# Patient Record
Sex: Female | Born: 1990 | Race: Black or African American | Hispanic: No | Marital: Married | Smoking: Never smoker
Health system: Southern US, Community
[De-identification: ages and names within clinical notes are randomized; demographics above are authoritative.]

## PROBLEM LIST (undated history)

## (undated) DIAGNOSIS — I10 Essential (primary) hypertension: Secondary | ICD-10-CM

## (undated) DIAGNOSIS — B20 Human immunodeficiency virus [HIV] disease: Secondary | ICD-10-CM

## (undated) DIAGNOSIS — Z87442 Personal history of urinary calculi: Secondary | ICD-10-CM

## (undated) DIAGNOSIS — D849 Immunodeficiency, unspecified: Secondary | ICD-10-CM

## (undated) DIAGNOSIS — N200 Calculus of kidney: Secondary | ICD-10-CM

## (undated) DIAGNOSIS — Z21 Asymptomatic human immunodeficiency virus [HIV] infection status: Secondary | ICD-10-CM

## (undated) DIAGNOSIS — B999 Unspecified infectious disease: Secondary | ICD-10-CM

## (undated) HISTORY — DX: Essential (primary) hypertension: I10

---
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
## 2010-05-27 LAB — URINALYSIS, ROUTINE W REFLEX MICROSCOPIC
Nitrite: NEGATIVE
Specific Gravity, Urine: 1.02 (ref 1.005–1.030)
Urobilinogen, UA: 1 mg/dL (ref 0.0–1.0)

## 2010-05-27 LAB — BASIC METABOLIC PANEL
GFR calc non Af Amer: >60 mL/min (ref >60)
Potassium: 3.3 mEq/L — ABNORMAL LOW (ref 3.5–5.1)
Sodium: 137 mEq/L (ref 135–145)

## 2010-05-27 LAB — CBC
HCT: 40.2 % (ref 36.0–46.0)
Hemoglobin: 13.4 g/dL (ref 12.0–15.0)
RDW: 14.9 % (ref 11.5–15.5)
WBC: 3.7 10*3/uL — ABNORMAL LOW (ref 4.0–10.5)

## 2010-05-27 LAB — DIFFERENTIAL
Basophils Absolute: 0 10*3/uL (ref 0.0–0.1)
Lymphocytes Relative: 29 % (ref 12–46)
Monocytes Absolute: 0.3 10*3/uL (ref 0.1–1.0)
Neutro Abs: 2.3 10*3/uL (ref 1.7–7.7)
Neutrophils Relative %: 61 % (ref 43–77)

## 2010-05-27 LAB — URINE MICROSCOPIC-ADD ON

## 2010-05-27 LAB — POCT PREGNANCY, URINE: Preg Test, Ur: NEGATIVE

## 2010-05-27 LAB — URINE CULTURE

## 2010-06-16 LAB — CBC
HCT: 27.2 % — ABNORMAL LOW (ref 36.0–46.0)
HCT: 29.7 % — ABNORMAL LOW (ref 36.0–46.0)
HCT: 31.7 % — ABNORMAL LOW (ref 36.0–46.0)
Hemoglobin: 11.8 g/dL — ABNORMAL LOW (ref 12.0–15.0)
Hemoglobin: 9.4 g/dL — ABNORMAL LOW (ref 12.0–15.0)
MCV: 89.5 fL (ref 78.0–100.0)
MCV: 90.6 fL (ref 78.0–100.0)
Platelets: 239 10*3/uL (ref 150–400)
Platelets: 275 10*3/uL (ref 150–400)
RBC: 3.87 MIL/uL (ref 3.87–5.11)
RBC: 3.04 MIL/uL — ABNORMAL LOW (ref 3.87–5.11)
RDW: 14.9 % (ref 11.5–15.5)
RDW: 15.4 % (ref 11.5–15.5)
WBC: 21 10*3/uL — ABNORMAL HIGH (ref 4.0–10.5)
WBC: 17.5 10*3/uL — ABNORMAL HIGH (ref 4.0–10.5)
WBC: 17.9 10*3/uL — ABNORMAL HIGH (ref 4.0–10.5)
WBC: 9.2 10*3/uL (ref 4.0–10.5)

## 2010-06-16 LAB — URINE MICROSCOPIC-ADD ON

## 2010-06-16 LAB — COMPREHENSIVE METABOLIC PANEL
ALT: 37 U/L — ABNORMAL HIGH (ref 0–35)
AST: 38 U/L — ABNORMAL HIGH (ref 0–37)
Alkaline Phosphatase: 115 U/L (ref 39–117)
CO2: 23 mEq/L (ref 19–32)
Chloride: 104 mEq/L (ref 96–112)
GFR calc Af Amer: >60 mL/min (ref >60)
GFR calc non Af Amer: >60 mL/min (ref >60)
Glucose, Bld: 105 mg/dL — ABNORMAL HIGH (ref 70–99)
Potassium: 3.4 mEq/L — ABNORMAL LOW (ref 3.5–5.1)
Sodium: 135 mEq/L (ref 135–145)

## 2010-06-16 LAB — URINALYSIS, ROUTINE W REFLEX MICROSCOPIC
Nitrite: NEGATIVE
Specific Gravity, Urine: 1.01 (ref 1.005–1.030)
Urobilinogen, UA: 2 mg/dL — ABNORMAL HIGH (ref 0.0–1.0)

## 2010-06-16 LAB — DIFFERENTIAL
Basophils Absolute: 0 10*3/uL (ref 0.0–0.1)
Basophils Relative: 0 % (ref 0–1)
Eosinophils Absolute: 0 10*3/uL (ref 0.0–0.7)
Eosinophils Relative: 0 % (ref 0–5)
Lymphocytes Relative: 5 % — ABNORMAL LOW (ref 12–46)
Lymphs Abs: 1.5 10*3/uL (ref 0.7–4.0)
Lymphs Abs: 0.9 10*3/uL (ref 0.7–4.0)
Neutro Abs: 15.7 10*3/uL — ABNORMAL HIGH (ref 1.7–7.7)
Neutrophils Relative %: 85 % — ABNORMAL HIGH (ref 43–77)

## 2010-06-16 LAB — URINE CULTURE

## 2010-06-16 LAB — LIPASE, BLOOD: Lipase: 15 U/L (ref 11–59)

## 2010-06-24 LAB — URINE MICROSCOPIC-ADD ON

## 2010-06-24 LAB — URINALYSIS, ROUTINE W REFLEX MICROSCOPIC
Bilirubin Urine: NEGATIVE
Glucose, UA: NEGATIVE mg/dL
Specific Gravity, Urine: 1.015 (ref 1.005–1.030)
Urobilinogen, UA: 1 mg/dL (ref 0.0–1.0)
pH: 6 (ref 5.0–8.0)

## 2010-06-24 LAB — URINE CULTURE

## 2010-12-13 LAB — STREP A DNA PROBE: Group A Strep Probe: NEGATIVE

## 2011-09-01 LAB — LIPID PANEL
Cholesterol: 166 mg/dL (ref 0–200)
HDL: 51 mg/dL (ref 35–70)
LDL Cholesterol: 108 mg/dL
Triglycerides: 34 mg/dL — AB (ref 40–160)

## 2011-09-01 LAB — HEPATIC FUNCTION PANEL: Alkaline Phosphatase: 84 U/L (ref 25–125)

## 2011-09-01 LAB — CBC AND DIFFERENTIAL: Hemoglobin: 11.8 g/dL — AB (ref 12.0–16.0)

## 2011-12-09 ENCOUNTER — Other Ambulatory Visit: Payer: Self-pay | Admitting: Internal Medicine

## 2011-12-09 ENCOUNTER — Ambulatory Visit (INDEPENDENT_AMBULATORY_CARE_PROVIDER_SITE_OTHER): Payer: Self-pay

## 2011-12-09 DIAGNOSIS — B20 Human immunodeficiency virus [HIV] disease: Secondary | ICD-10-CM

## 2011-12-09 DIAGNOSIS — Z113 Encounter for screening for infections with a predominantly sexual mode of transmission: Secondary | ICD-10-CM

## 2011-12-09 DIAGNOSIS — Z79899 Other long term (current) drug therapy: Secondary | ICD-10-CM

## 2011-12-09 DIAGNOSIS — Z206 Contact with and (suspected) exposure to human immunodeficiency virus [HIV]: Secondary | ICD-10-CM

## 2011-12-09 LAB — CBC WITH DIFFERENTIAL/PLATELET
Basophils Absolute: 0 10*3/uL (ref 0.0–0.1)
Eosinophils Absolute: 0.2 10*3/uL (ref 0.0–0.7)
Eosinophils Relative: 4 % (ref 0–5)
Lymphocytes Relative: 10 % — ABNORMAL LOW (ref 12–46)
MCV: 83 fL (ref 78.0–100.0)
Neutrophils Relative %: 79 % — ABNORMAL HIGH (ref 43–77)
Platelets: 51 10*3/uL — ABNORMAL LOW (ref 150–400)
RDW: 13.3 % (ref 11.5–15.5)
WBC: 5.1 10*3/uL (ref 4.0–10.5)

## 2011-12-09 LAB — LIPID PANEL
Cholesterol: 173 mg/dL (ref 0–200)
HDL: 49 mg/dL (ref >39)
LDL Cholesterol: 116 mg/dL — ABNORMAL HIGH (ref 0–99)
Triglycerides: 38 mg/dL (ref <150)

## 2011-12-09 LAB — HEPATITIS B SURFACE ANTIGEN: Hepatitis B Surface Ag: NEGATIVE

## 2011-12-09 LAB — HEPATITIS C ANTIBODY: HCV Ab: NEGATIVE

## 2011-12-09 LAB — HEPATITIS B SURFACE ANTIBODY,QUALITATIVE: Hep B S Ab: NEGATIVE

## 2011-12-09 LAB — T-HELPER CELL (CD4) - (RCID CLINIC ONLY): CD4 % Helper T Cell: 21 % — ABNORMAL LOW (ref 33–55)

## 2011-12-10 LAB — HEPATITIS B CORE ANTIBODY, TOTAL: Hep B Core Total Ab: NEGATIVE

## 2011-12-10 LAB — HEPATITIS A ANTIBODY, TOTAL: Hep A Total Ab: NEGATIVE

## 2011-12-10 LAB — COMPLETE METABOLIC PANEL WITH GFR
ALT: 14 U/L (ref 0–35)
AST: 19 U/L (ref 0–37)
Chloride: 105 mEq/L (ref 96–112)
Creat: 0.83 mg/dL (ref 0.50–1.10)
Sodium: 136 mEq/L (ref 135–145)
Total Bilirubin: 0.7 mg/dL (ref 0.3–1.2)

## 2011-12-10 LAB — URINALYSIS
Ketones, ur: NEGATIVE mg/dL
Nitrite: POSITIVE — AB
Specific Gravity, Urine: 1.021 (ref 1.005–1.030)
pH: 6.5 (ref 5.0–8.0)

## 2011-12-15 LAB — HIV-1 GENOTYPR PLUS

## 2011-12-16 DIAGNOSIS — Z206 Contact with and (suspected) exposure to human immunodeficiency virus [HIV]: Secondary | ICD-10-CM | POA: Insufficient documentation

## 2011-12-16 DIAGNOSIS — Z23 Encounter for immunization: Secondary | ICD-10-CM

## 2011-12-16 LAB — HIV RNA, QUANTITATIVE, PCR: HIV 1 RNA Quant: 4030

## 2011-12-16 NOTE — Progress Notes (Signed)
Pt diagnosed through perinatal exposure.   She has expressed interest in starting anti retrovirals. She would also like to start on some form of birth control and will need a referral to [facility].  She has never had a pap/pelvic exam.  Pt. is currently attending [employer] school in [place] and has requested to transfer her HIV care to [place] due to transportation and time issues. Currently due to school schedule she is only able to come for Friday am visits.  I have informed the patient after the first visit this may no longer be an option.    Laurell Josephs, RN

## 2012-01-06 ENCOUNTER — Ambulatory Visit: Payer: Self-pay | Admitting: Internal Medicine

## 2012-01-06 ENCOUNTER — Ambulatory Visit: Payer: Self-pay

## 2012-01-13 ENCOUNTER — Ambulatory Visit: Payer: Self-pay | Admitting: Internal Medicine

## 2012-02-12 ENCOUNTER — Encounter (HOSPITAL_COMMUNITY): Payer: Self-pay | Admitting: Emergency Medicine

## 2012-02-12 ENCOUNTER — Emergency Department (HOSPITAL_COMMUNITY)
Admission: EM | Admit: 2012-02-12 | Discharge: 2012-02-13 | Disposition: A | Discharge status: Home / Self Care | Payer: Self-pay | Attending: Emergency Medicine | Admitting: Emergency Medicine

## 2012-02-12 DIAGNOSIS — N730 Acute parametritis and pelvic cellulitis: Secondary | ICD-10-CM | POA: Insufficient documentation

## 2012-02-12 DIAGNOSIS — Z3202 Encounter for pregnancy test, result negative: Secondary | ICD-10-CM | POA: Insufficient documentation

## 2012-02-12 DIAGNOSIS — N760 Acute vaginitis: Secondary | ICD-10-CM | POA: Insufficient documentation

## 2012-02-12 DIAGNOSIS — N390 Urinary tract infection, site not specified: Secondary | ICD-10-CM | POA: Insufficient documentation

## 2012-02-12 LAB — URINALYSIS, ROUTINE W REFLEX MICROSCOPIC
Nitrite: NEGATIVE
Protein, ur: NEGATIVE mg/dL
Specific Gravity, Urine: 1.015 (ref 1.005–1.030)
Urobilinogen, UA: 0.2 mg/dL (ref 0.0–1.0)

## 2012-02-12 LAB — WET PREP, GENITAL
Trich, Wet Prep: NONE SEEN
Yeast Wet Prep HPF POC: NONE SEEN

## 2012-02-12 LAB — PREGNANCY, URINE: Preg Test, Ur: NEGATIVE

## 2012-02-12 LAB — URINE MICROSCOPIC-ADD ON

## 2012-02-12 NOTE — ED Notes (Signed)
Pt c/o lower abd since the 27th.

## 2012-02-12 NOTE — ED Provider Notes (Signed)
History   This chart was scribed for Glynn Octave, MD by Leone Payor, ED Scribe. This patient was seen in room APA05/APA05 and the patient's care was started at 2140.   CSN: [id number]  Arrival date & time 02/12/12  1826   First MD Initiated Contact with Patient 02/12/12 2140      Chief Complaint  Patient presents with  . Abdominal Pain     The history is provided by the patient. No language interpreter was used.    Kimberly Bishop is a 21 y.o. female who presents to the Emergency Department complaining of moderate, intermittent lower abdominal pain for 5 days. Pt denies any vaginal discharge or vaginal bleeding. She reports being sexually active.  Pt reports a h/o appendectomy.  Pt denies smoking and alcohol use.   History reviewed. No pertinent past medical history.  Past Surgical History  Procedure Date  . Appendectomy 2011    Family History  Problem Relation Age of Onset  . Adopted: Yes    History  Substance Use Topics  . Smoking status: Never Smoker   . Smokeless tobacco: Never Used  . Alcohol Use: No    No OB history provided.   Review of Systems  Gastrointestinal: Positive for abdominal pain.  Genitourinary: Negative for vaginal bleeding and vaginal discharge.  All other systems reviewed and are negative.    Allergies  Review of patient's allergies indicates no known allergies.  Home Medications  No current outpatient prescriptions on file.  BP 136/72  Pulse 91  Temp 98.5 F (36.9 C) (Oral)  Resp 17  Ht 5\' 2"  (1.575 m)  Wt 110 lb (49.896 kg)  BMI 20.12 kg/m2  SpO2 98%  LMP 01/20/2012  Physical Exam  Nursing note and vitals reviewed. Constitutional: She is oriented to person, place, and time. She appears well-developed and well-nourished. No distress.  HENT:  Head: Normocephalic and atraumatic.  Eyes: EOM are normal.  Neck: Normal range of motion. Neck supple. No tracheal deviation present.  Cardiovascular: Normal rate, regular  rhythm and normal heart sounds.   Pulmonary/Chest: Effort normal and breath sounds normal. No respiratory distress.  Abdominal: Soft. Bowel sounds are normal.       Suprapubic tenderness. No cva tenderness. No guarding or rebound.   Genitourinary: Cervix exhibits discharge. Cervix exhibits no motion tenderness. Right adnexum displays no tenderness. Vaginal discharge found.       Copious white cervical discharge, no CMT, no adnexal tenderness  Musculoskeletal: Normal range of motion.  Neurological: She is alert and oriented to person, place, and time.  Skin: Skin is warm and dry.  Psychiatric: She has a normal mood and affect. Her behavior is normal.    ED Course  Procedures (including critical care time)  DIAGNOSTIC STUDIES: Oxygen Saturation is 98% on room air, normal by my interpretation.    COORDINATION OF CARE: 9:48 PM Discussed treatment plan which includes pelvic exam with pt at bedside and pt agreed to plan.    Labs Reviewed  URINALYSIS, ROUTINE W REFLEX MICROSCOPIC - Abnormal; Notable for the following:    APPearance CLOUDY (*)     Hgb urine dipstick TRACE (*)     Leukocytes, UA SMALL (*)     All other components within normal limits  WET PREP, GENITAL - Abnormal; Notable for the following:    Clue Cells Wet Prep HPF POC MANY (*)     WBC, Wet Prep HPF POC FEW (*)     All other components within normal  limits  URINE MICROSCOPIC-ADD ON - Abnormal; Notable for the following:    Squamous Epithelial / LPF FEW (*)     Bacteria, UA MANY (*)     All other components within normal limits  PREGNANCY, URINE  GC/CHLAMYDIA PROBE AMP  URINE CULTURE   No results found.   No diagnosis found.    MDM  Suprapubic abdominal pain for the past 4 days. No fever, chills, dysuria, hematuria, vaginal bleeding or discharge.  HCG negative. Urinalysis positive.  We'll treat her urinary tract infection, bacterial vaginosis and empirically for PID.    I personally performed the  services described in this documentation, which was scribed in my presence. The recorded information has been reviewed and is accurate.   Glynn Octave, MD 02/13/12 0005

## 2012-02-13 MED ORDER — CEPHALEXIN 500 MG PO CAPS: 500.0000 mg | ORAL_CAPSULE | Freq: Four times a day (QID) | ORAL | Status: DC

## 2012-02-13 MED ORDER — ONDANSETRON 4 MG PO TBDP
4.0000 mg | ORAL_TABLET | Freq: Once | ORAL | Status: AC
  Administered 2012-02-13: 4 mg via ORAL
  Filled 2012-02-13: qty 1

## 2012-02-13 MED ORDER — AZITHROMYCIN 250 MG PO TABS
1000.0000 mg | ORAL_TABLET | Freq: Every day | ORAL | Status: DC
  Administered 2012-02-13: 1000 mg via ORAL
  Filled 2012-02-13: qty 4

## 2012-02-13 MED ORDER — LIDOCAINE HCL (PF) 1 % IJ SOLN
INTRAMUSCULAR | Status: AC
  Administered 2012-02-13: 5 mL
  Filled 2012-02-13: qty 5

## 2012-02-13 MED ORDER — METRONIDAZOLE 500 MG PO TABS
2000.0000 mg | ORAL_TABLET | Freq: Once | ORAL | Status: AC
  Administered 2012-02-13: 2000 mg via ORAL
  Filled 2012-02-13: qty 4

## 2012-02-13 MED ORDER — CEFTRIAXONE SODIUM 250 MG IJ SOLR
250.0000 mg | Freq: Once | INTRAMUSCULAR | Status: AC
  Administered 2012-02-13: 250 mg via INTRAMUSCULAR
  Filled 2012-02-13: qty 250

## 2012-02-14 LAB — GC/CHLAMYDIA PROBE AMP: CT Probe RNA: NEGATIVE

## 2012-02-14 LAB — URINE CULTURE

## 2012-04-12 ENCOUNTER — Ambulatory Visit: Payer: Self-pay | Admitting: Internal Medicine

## 2012-06-18 ENCOUNTER — Telehealth: Payer: Self-pay

## 2012-06-18 DIAGNOSIS — B20 Human immunodeficiency virus [HIV] disease: Secondary | ICD-10-CM

## 2012-06-18 NOTE — Telephone Encounter (Signed)
Pt was a no show for [facility] visit after intake. She is calling now to reschedule visit.  She is still in school and has a hectic schedule.   I have schedule her for repeat labs and office visit. She was advised to call and reschedule or cancel if she can't make appointment.  She was also informed of abnormal labs and the importance of a return visit.   Laurell Josephs, RN

## 2012-06-20 ENCOUNTER — Other Ambulatory Visit: Payer: Self-pay | Admitting: *Deleted

## 2012-06-20 ENCOUNTER — Ambulatory Visit: Payer: Self-pay

## 2012-06-20 DIAGNOSIS — B20 Human immunodeficiency virus [HIV] disease: Secondary | ICD-10-CM

## 2012-06-20 DIAGNOSIS — Z113 Encounter for screening for infections with a predominantly sexual mode of transmission: Secondary | ICD-10-CM

## 2012-06-21 ENCOUNTER — Other Ambulatory Visit: Payer: Self-pay | Admitting: Internal Medicine

## 2012-06-21 ENCOUNTER — Other Ambulatory Visit (INDEPENDENT_AMBULATORY_CARE_PROVIDER_SITE_OTHER): Payer: Self-pay

## 2012-06-21 ENCOUNTER — Ambulatory Visit: Payer: Self-pay

## 2012-06-21 DIAGNOSIS — Z113 Encounter for screening for infections with a predominantly sexual mode of transmission: Secondary | ICD-10-CM

## 2012-06-21 DIAGNOSIS — B20 Human immunodeficiency virus [HIV] disease: Secondary | ICD-10-CM

## 2012-06-21 LAB — CBC WITH DIFFERENTIAL/PLATELET
Eosinophils Absolute: 0.1 10*3/uL (ref 0.0–0.7)
Eosinophils Relative: 2 % (ref 0–5)
HCT: 38.3 % (ref 36.0–46.0)
Lymphocytes Relative: 10 % — ABNORMAL LOW (ref 12–46)
Lymphs Abs: 0.4 10*3/uL — ABNORMAL LOW (ref 0.7–4.0)
MCH: 28.4 pg (ref 26.0–34.0)
MCV: 84.9 fL (ref 78.0–100.0)
Monocytes Absolute: 0.4 10*3/uL (ref 0.1–1.0)
Platelets: 52 10*3/uL — ABNORMAL LOW (ref 150–400)
RBC: 4.51 MIL/uL (ref 3.87–5.11)

## 2012-06-21 LAB — COMPLETE METABOLIC PANEL WITH GFR
ALT: 18 U/L (ref 0–35)
AST: 21 U/L (ref 0–37)
Albumin: 4.4 g/dL (ref 3.5–5.2)
Alkaline Phosphatase: 85 U/L (ref 39–117)
Potassium: 4.4 mEq/L (ref 3.5–5.3)
Sodium: 139 mEq/L (ref 135–145)
Total Protein: 8.2 g/dL (ref 6.0–8.3)

## 2012-06-24 ENCOUNTER — Emergency Department (INDEPENDENT_AMBULATORY_CARE_PROVIDER_SITE_OTHER)
Admission: EM | Admit: 2012-06-24 | Discharge: 2012-06-24 | Disposition: A | Discharge status: Home / Self Care | Payer: Self-pay | Source: Home / Self Care

## 2012-06-24 ENCOUNTER — Encounter (HOSPITAL_COMMUNITY): Payer: Self-pay | Admitting: *Deleted

## 2012-06-24 DIAGNOSIS — J029 Acute pharyngitis, unspecified: Secondary | ICD-10-CM

## 2012-06-24 DIAGNOSIS — B20 Human immunodeficiency virus [HIV] disease: Secondary | ICD-10-CM

## 2012-06-24 HISTORY — DX: Asymptomatic human immunodeficiency virus (hiv) infection status: Z21

## 2012-06-24 HISTORY — DX: Human immunodeficiency virus (HIV) disease: B20

## 2012-06-24 HISTORY — DX: Immunodeficiency, unspecified: D84.9

## 2012-06-24 LAB — POCT RAPID STREP A: Streptococcus, Group A Screen (Direct): NEGATIVE

## 2012-06-24 NOTE — ED Provider Notes (Signed)
Kimberly Bishop is a 22 y.o. female who presents to Urgent Care today for sore throat. Patient has had a sore throat for the last several days. She feels well otherwise with no fevers chills cough or congestion. She denies any weight loss.    However she has a history of perinatal acquired HIV. When she was an adolescent she was on therapy with an undetectable viral load. However now that she is an adult she was transitioned to the adult clinic and has not been seen. She is no longer taking any medication and having unprotected sex with her boyfriend.  She was recently approved for medication therapy at the adult clinic however missed her appointment.  Labs at that time showed a CD4 count of 60 with a viral load of 2700. She is currently asymptomatic.   PMH reviewed. HIV History  Substance Use Topics  . Smoking status: Never Smoker   . Smokeless tobacco: Never Used  . Alcohol Use: No   ROS as above Medications reviewed. No current facility-administered medications for this encounter.   Current Outpatient Prescriptions  Medication Sig Dispense Refill  . cephALEXin (KEFLEX) 500 MG capsule Take 1 capsule (500 mg total) by mouth 4 (four) times daily.  40 capsule  0    Exam:  BP 144/81  Pulse 86  Temp(Src) 98.8 F (37.1 C) (Oral)  Resp 18  SpO2 100% Gen: Well NAD HEENT: EOMI,  MMM, no ulcers or exudate in the posterior pharynx. No lymphadenopathy.  Lungs: CTABL Nl WOB Heart: RRR no MRG Abd: NABS, NT, ND Exts: Non edematous BL  LE, warm and well perfused.   Results for orders placed during the hospital encounter of 06/24/12 (from the past 24 hour(s))  POCT RAPID STREP A ([facility] URG CARE ONLY)     Status: None   Collection Time    06/24/12  3:44 PM      Result Value Range   Streptococcus, Group A Screen (Direct) NEGATIVE  NEGATIVE   No results found.  Assessment and Plan: 22 y.o. 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   I  had a lengthy discussion of the importance of maintaining therapy. I explained that she is likely to die within several years if she does not take her antiviral medication. Additionally had a lengthy discussion about the importance of contraception with her boyfriend and herself. I stressed that her boyfriend get tested for HIV and avoid any sexual contacts with anyone else.   As for her sore throat this is likely viral pharyngitis is currently in the community. I don't see any evidence of CMV ulcer or thrush or other immunodeficiency related cause of pharyngitis.  However I explained that this may be a cause of her sore throat.   I again stressed the importance of following up with the adult infectious disease clinic and treating this disease with the seriousness that it deserves. I explained that she can have a normal healthy life if she is on adequate anti-retroviral therapy and under the care of an infectious disease doctor.   After some time she expresses understanding and agreement. She will call the infectious disease clinic tomorrow.      Rodolph Bong, MD 06/24/12 1600

## 2012-06-24 NOTE — ED Provider Notes (Signed)
Medical screening examination/treatment/procedure(s) were performed by non-physician practitioner and as supervising physician I was immediately available for consultation/collaboration.  Raynald Blend, MD 06/24/12 1640

## 2012-06-24 NOTE — ED Notes (Signed)
Sore throat for the past 2 days

## 2012-06-25 ENCOUNTER — Telehealth: Payer: Self-pay | Admitting: *Deleted

## 2012-06-25 NOTE — Telephone Encounter (Signed)
Patient called and advised she went to the ED over the weekend and was told she needs to get a sooner appt and get on meds asap. She is worried and I explained to her she needs to speak with Britta Mccreedy to get her ADAP/[facility] paperwork filled out as we do not have meds in the clinic and it will take at least 2 weeks to get approved to cover her meds.

## 2012-06-25 NOTE — Telephone Encounter (Signed)
Patient advised she has already meet with Britta Mccreedy and done the paperwork.

## 2012-07-03 ENCOUNTER — Ambulatory Visit (INDEPENDENT_AMBULATORY_CARE_PROVIDER_SITE_OTHER): Payer: Self-pay | Admitting: Internal Medicine

## 2012-07-03 ENCOUNTER — Encounter: Payer: Self-pay | Admitting: Internal Medicine

## 2012-07-03 ENCOUNTER — Telehealth: Payer: Self-pay

## 2012-07-03 VITALS — BP 146/81 | HR 87 | Temp 98.3°F | Ht 62.0 in | Wt 106.0 lb

## 2012-07-03 DIAGNOSIS — O98719 Human immunodeficiency virus [HIV] disease complicating pregnancy, unspecified trimester: Secondary | ICD-10-CM | POA: Insufficient documentation

## 2012-07-03 DIAGNOSIS — B20 Human immunodeficiency virus [HIV] disease: Secondary | ICD-10-CM

## 2012-07-03 LAB — POCT URINE PREGNANCY: Preg Test, Ur: NEGATIVE

## 2012-07-03 MED ORDER — SULFAMETHOXAZOLE-TMP DS 800-160 MG PO TABS: 1.0000 | ORAL_TABLET | Freq: Every day | ORAL | Status: DC

## 2012-07-03 MED ORDER — DARUNAVIR ETHANOLATE 800 MG PO TABS: 800.0000 mg | ORAL_TABLET | Freq: Every day | ORAL | Status: DC

## 2012-07-03 MED ORDER — EMTRICITABINE-TENOFOVIR DF 200-300 MG PO TABS: 1.0000 | ORAL_TABLET | Freq: Every day | ORAL | Status: DC

## 2012-07-03 MED ORDER — RITONAVIR 100 MG PO TABS: 100.0000 mg | ORAL_TABLET | Freq: Every day | ORAL | Status: DC

## 2012-07-03 NOTE — Telephone Encounter (Signed)
Could not reach patient by phone - need more information to process adap application - mailed note to her address to call me.

## 2012-07-03 NOTE — Progress Notes (Signed)
  Subjective:    Patient ID: Kimberly Bishop, female    DOB: 21-Jun-1990, 22 y.o.   MRN: [id number]  HPI She comes in here to establish care as a new patient with HIV. She was congenitally transmitted and previously was in the care at the pediatric infectious disease clinic at [facility] and previously was on Sustiva, Kaletra and didanosine. At that time, she was well controlled with an undetectable viral load by her report. However she transitioned to adult care and was going to transition her care to [facility] however never did followup. She was initially scheduled one year ago however did not show up. She was seen recently in the [facility] urgent care and told of her low CD4 count.  She now comes in here interested in starting therapy. She is currently sexually active and her boyfriend is in the room with her. They do not use condoms consistently. He is aware of her status. She has not had a history of gonorrhea, Chlamydia or syphilis her knowledge. No recent hospitalizations or new issues.   Review of Systems  Constitutional: Negative for fever, chills, activity change, appetite change, fatigue and unexpected weight change.  HENT: Negative for sore throat and trouble swallowing.   Respiratory: Negative for cough and shortness of breath.   Cardiovascular: Negative for chest pain, palpitations and leg swelling.  Gastrointestinal: Negative for nausea, abdominal pain and diarrhea.  Musculoskeletal: Negative for myalgias, joint swelling and arthralgias.  Skin: Negative for rash.  Neurological: Negative for dizziness, light-headedness and headaches.       Objective:   Physical Exam  Constitutional: She appears well-developed and well-nourished. No distress.  HENT:  Mouth/Throat: Oropharynx is clear and moist. No oropharyngeal exudate.  Cardiovascular: Normal rate, regular rhythm and normal heart sounds.  Exam reveals no gallop and no friction rub.   No murmur heard. Pulmonary/Chest:  Effort normal and breath sounds normal. No respiratory distress. She has no wheezes. She has no rales.  Abdominal: Soft. Bowel sounds are normal. She exhibits no distension. There is no tenderness. There is no rebound.  Lymphadenopathy:    She has no cervical adenopathy.  Skin: No rash noted.          Assessment & Plan:

## 2012-07-03 NOTE — Progress Notes (Signed)
HPI: Kimberly Bishop is a 22 y.o. female with HIV from birth presents for reinitiating of ART. She has been off medications for the last 2.5 years and reports that she stopped because the pills were too big to swallow.  Allergies: No Known Allergies  Vitals: Temp: 98.3 F (36.8 C) (04/22 1511) Temp src: Oral (04/22 1511) BP: 146/81 mmHg (04/22 1511) Pulse Rate: 87 (04/22 1511)  Past Medical History: Past Medical History  Diagnosis Date  . Immune deficiency disorder   . HIV (human immunodeficiency virus infection)   . 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 Marital Status: Single    Spouse Name: N/A    Number of Children: N/A  . Years of Education: N/A   Social History Main Topics  . Smoking status: Never Smoker   . Smokeless tobacco: Never Used  . Alcohol Use: No  . Drug Use: No  . Sexually Active: Not Currently -- Female partner(s)    Birth Control/ Protection: Condom   Other Topics Concern  . None   Social History Narrative  . None    Previous Regimen: Efavrinez, didanosine, Kaletra  Current Regimen: No ART in >2 years  Labs: HIV 1 RNA Quant (copies/mL)  Date Value  06/21/2012 2756*  12/09/2011 2637*  09/01/2011 4030      CD4 T Cell Abs (cmm)  Date Value  06/21/2012 60*  12/09/2011 100*     Hep B S Ab (no units)  Date Value  12/09/2011 NEG      Hepatitis B Surface Ag (no units)  Date Value  12/09/2011 NEGATIVE      HCV Ab (no units)  Date Value  12/09/2011 NEGATIVE     CrCl: Estimated Creatinine Clearance: 84.5 ml/min (by C-G formula based on Cr of 0.69).  Lipids:    Component Value Date/Time   CHOL 173 12/09/2011 0953   TRIG 38 12/09/2011 0953   HDL 49 12/09/2011 0953   CHOLHDL 3.5 12/09/2011 0953   VLDL 8 12/09/2011 0953   LDLCALC 116* 12/09/2011 0953    Assessment: Spoke with patient about restarting ART, she has poor understanding of her disease and is a high risk for  poor adherence. We spoke about starting Darunavir/r, Truvada. Discussed importance of adherence and the ability to split/crush tablets is she is unable to swallow.  Recommendations: Start darunavir, ritonavir, Truvada and Bactrim. Follow up in 2 weeks and receive Hepatitis B vaccine at that time  Drue Stager, PharmD [facility] 07/03/2012, 3:41 PM

## 2012-07-03 NOTE — Assessment & Plan Note (Addendum)
She does have active HIV. She has a low CD4 count of 60. I did discuss the treatment options and after discussion I will start her on Prezista, Norvir and Truvada. I was concerned with potential for pregnancy and compliance. I also will check urine pregnancy today since she is concerned that she might be pregnant. She will return in 2 weeks. Paperwork has been filled out for the drug assistance program. I did discuss medications at length along with the pharmacist and discussed side effects and the need for compliance and mechanism of resistance.  She will need opportunistic infection prophylaxis with Bactrim

## 2012-07-09 ENCOUNTER — Ambulatory Visit: Payer: Self-pay | Admitting: Internal Medicine

## 2012-07-19 ENCOUNTER — Encounter: Payer: Self-pay | Admitting: Internal Medicine

## 2012-07-19 ENCOUNTER — Other Ambulatory Visit: Payer: Self-pay | Admitting: *Deleted

## 2012-07-19 ENCOUNTER — Ambulatory Visit (INDEPENDENT_AMBULATORY_CARE_PROVIDER_SITE_OTHER): Payer: Self-pay | Admitting: Internal Medicine

## 2012-07-19 VITALS — BP 121/74 | HR 71 | Temp 98.2°F | Ht 62.0 in | Wt 106.0 lb

## 2012-07-19 DIAGNOSIS — B20 Human immunodeficiency virus [HIV] disease: Secondary | ICD-10-CM

## 2012-07-19 MED ORDER — SULFAMETHOXAZOLE-TMP DS 800-160 MG PO TABS: 1.0000 | ORAL_TABLET | Freq: Every day | ORAL | Status: DC

## 2012-07-19 MED ORDER — DARUNAVIR ETHANOLATE 800 MG PO TABS: 800.0000 mg | ORAL_TABLET | Freq: Every day | ORAL | Status: DC

## 2012-07-19 MED ORDER — EMTRICITABINE-TENOFOVIR DF 200-300 MG PO TABS: 1.0000 | ORAL_TABLET | Freq: Every day | ORAL | Status: DC

## 2012-07-19 MED ORDER — RITONAVIR 100 MG PO TABS: 100.0000 mg | ORAL_TABLET | Freq: Every day | ORAL | Status: DC

## 2012-07-19 NOTE — Progress Notes (Signed)
  Subjective:    Patient ID: Kimberly Bishop, female    DOB: May 09, 1990, 22 y.o.   MRN: [id number]  HPI  Comes in for follow up after being seen as a new patient.  She was perinatally infected but has been off of meds for several months due to lack of follow up after transitioning to an adult clinic.  She finished paperwork for ADAP and was approved this am.No new issues.  Review of Systems  Constitutional: Negative for fever and fatigue.  Respiratory: Negative for cough and shortness of breath.   Cardiovascular: Negative for chest pain.  Skin: Negative for rash.  Hematological: Negative for adenopathy.       Objective:   Physical Exam  Constitutional: She appears well-developed and well-nourished. No distress.  Cardiovascular: Normal rate, regular rhythm and normal heart sounds.  Exam reveals no gallop and no friction rub.   No murmur heard. Pulmonary/Chest: Effort normal and breath sounds normal. No respiratory distress. She has no wheezes. She has no rales.          Assessment & Plan:

## 2012-07-19 NOTE — Assessment & Plan Note (Signed)
She will start today, labs in 3-4 weeks and follow up with me 2 weeks later.  Also will take Bactrim daily for PCP prophylaxis.

## 2012-08-16 ENCOUNTER — Other Ambulatory Visit: Payer: Self-pay

## 2012-08-30 ENCOUNTER — Ambulatory Visit (INDEPENDENT_AMBULATORY_CARE_PROVIDER_SITE_OTHER): Payer: Self-pay | Admitting: Internal Medicine

## 2012-08-30 ENCOUNTER — Encounter: Payer: Self-pay | Admitting: Internal Medicine

## 2012-08-30 VITALS — BP 137/76 | HR 93 | Temp 97.3°F | Ht 62.0 in | Wt 106.0 lb

## 2012-08-30 DIAGNOSIS — N926 Irregular menstruation, unspecified: Secondary | ICD-10-CM

## 2012-08-30 DIAGNOSIS — B20 Human immunodeficiency virus [HIV] disease: Secondary | ICD-10-CM

## 2012-08-30 DIAGNOSIS — R112 Nausea with vomiting, unspecified: Secondary | ICD-10-CM | POA: Insufficient documentation

## 2012-08-30 MED ORDER — ONDANSETRON HCL 4 MG PO TABS: 4.0000 mg | ORAL_TABLET | Freq: Every day | ORAL | Status: DC

## 2012-08-30 NOTE — Assessment & Plan Note (Addendum)
I again discussed the importance of taking her medicine. I gave her Zofran to take prior to taking the medication. She is going to pick this up today and restart. I did explain that it may take several months to get over this however once her immune system is back and viruses down she likely will have no further problems with nausea and vomiting. I will have her return in one week Also will have a pregnancy test checked since she is late on her menstruation

## 2012-08-30 NOTE — Assessment & Plan Note (Signed)
As above, she will get some Zofran.

## 2012-08-30 NOTE — Progress Notes (Signed)
  Subjective:    Patient ID: Kimberly Bishop, female    DOB: 01-30-1991, 22 y.o.   MRN: [id number]  HPI He comes in for followup of her HIV. She was seen as a new visit earlier this year and has perinatally acquired HIV. She was last on medications about 2-1/2 years ago though stopped she said 2 to the pills being too big. She came in with a CD4 count of 100. I started her on Prezista, Norvir and Truvada. There is no known resistance. She does stop taking after a short time since she was having problems with nausea and vomiting. She did not call otherwise and she did not get any followup labs. She has no other new problems though does tell me she is late on her menstruation. She is sexually active. Off of the medications, she has had no problems with nausea and vomiting. No weight loss, no diarrhea.   Review of Systems  Constitutional: Negative for fever, appetite change, fatigue and unexpected weight change.  HENT: Negative for sore throat and trouble swallowing.   Respiratory: Negative for shortness of breath.   Gastrointestinal: Positive for nausea and vomiting. Negative for abdominal pain and diarrhea.  Musculoskeletal: Negative for myalgias and arthralgias.  Skin: Negative for rash.  Neurological: Negative for dizziness and headaches.       Objective:   Physical Exam  Constitutional: She appears well-developed and well-nourished. No distress.  Cardiovascular: Normal rate, regular rhythm and normal heart sounds.   No murmur heard. Pulmonary/Chest: Effort normal and breath sounds normal. No respiratory distress.  Abdominal: Soft. Bowel sounds are normal. She exhibits no distension. There is no tenderness.  Lymphadenopathy:    She has no cervical adenopathy.          Assessment & Plan:

## 2012-09-05 ENCOUNTER — Emergency Department (HOSPITAL_COMMUNITY): Payer: Self-pay

## 2012-09-05 ENCOUNTER — Emergency Department (HOSPITAL_COMMUNITY)
Admission: EM | Admit: 2012-09-05 | Discharge: 2012-09-05 | Disposition: A | Discharge status: Home / Self Care | Payer: Self-pay | Attending: Emergency Medicine | Admitting: Emergency Medicine

## 2012-09-05 ENCOUNTER — Encounter (HOSPITAL_COMMUNITY): Payer: Self-pay | Admitting: Emergency Medicine

## 2012-09-05 DIAGNOSIS — R509 Fever, unspecified: Secondary | ICD-10-CM | POA: Insufficient documentation

## 2012-09-05 DIAGNOSIS — R059 Cough, unspecified: Secondary | ICD-10-CM | POA: Insufficient documentation

## 2012-09-05 DIAGNOSIS — B59 Pneumocystosis: Secondary | ICD-10-CM | POA: Insufficient documentation

## 2012-09-05 DIAGNOSIS — Z91199 Patient's noncompliance with other medical treatment and regimen due to unspecified reason: Secondary | ICD-10-CM | POA: Insufficient documentation

## 2012-09-05 DIAGNOSIS — R112 Nausea with vomiting, unspecified: Secondary | ICD-10-CM | POA: Insufficient documentation

## 2012-09-05 DIAGNOSIS — Z79899 Other long term (current) drug therapy: Secondary | ICD-10-CM | POA: Insufficient documentation

## 2012-09-05 DIAGNOSIS — R05 Cough: Secondary | ICD-10-CM | POA: Insufficient documentation

## 2012-09-05 DIAGNOSIS — B20 Human immunodeficiency virus [HIV] disease: Secondary | ICD-10-CM | POA: Insufficient documentation

## 2012-09-05 DIAGNOSIS — Z9119 Patient's noncompliance with other medical treatment and regimen: Secondary | ICD-10-CM | POA: Insufficient documentation

## 2012-09-05 LAB — COMPREHENSIVE METABOLIC PANEL
Albumin: 3.9 g/dL (ref 3.5–5.2)
BUN: 10 mg/dL (ref 6–23)
Calcium: 9 mg/dL (ref 8.4–10.5)
Chloride: 102 mEq/L (ref 96–112)
Creatinine, Ser: 0.95 mg/dL (ref 0.50–1.10)
Total Bilirubin: 0.5 mg/dL (ref 0.3–1.2)

## 2012-09-05 LAB — CBC WITH DIFFERENTIAL/PLATELET
Basophils Absolute: 0 10*3/uL (ref 0.0–0.1)
Basophils Relative: 0 % (ref 0–1)
Eosinophils Absolute: 0 10*3/uL (ref 0.0–0.7)
Eosinophils Relative: 0 % (ref 0–5)
HCT: 35.2 % — ABNORMAL LOW (ref 36.0–46.0)
Hemoglobin: 12.4 g/dL (ref 12.0–15.0)
MCH: 28.8 pg (ref 26.0–34.0)
MCHC: 35.2 g/dL (ref 30.0–36.0)
MCV: 81.7 fL (ref 78.0–100.0)
Monocytes Absolute: 0.6 10*3/uL (ref 0.1–1.0)
Monocytes Relative: 8 % (ref 3–12)
RDW: 12.6 % (ref 11.5–15.5)

## 2012-09-05 LAB — GLUCOSE, CAPILLARY: Glucose-Capillary: 83 mg/dL (ref 70–99)

## 2012-09-05 MED ORDER — SODIUM CHLORIDE 0.9 % IV BOLUS (SEPSIS)
1000.0000 mL | Freq: Once | INTRAVENOUS | Status: AC
  Administered 2012-09-05: 1000 mL via INTRAVENOUS

## 2012-09-05 MED ORDER — ONDANSETRON HCL 4 MG/2ML IJ SOLN
4.0000 mg | Freq: Once | INTRAMUSCULAR | Status: AC
  Administered 2012-09-05: 4 mg via INTRAVENOUS
  Filled 2012-09-05: qty 2

## 2012-09-05 MED ORDER — HYDROCOD POLST-CHLORPHEN POLST 10-8 MG/5ML PO LQCR: 5.0000 mL | Freq: Two times a day (BID) | ORAL | Status: DC | PRN

## 2012-09-05 MED ORDER — SULFAMETHOXAZOLE-TMP DS 800-160 MG PO TABS
2.0000 | ORAL_TABLET | Freq: Once | ORAL | Status: AC
Start: 2012-09-05 — End: 2012-09-05
  Administered 2012-09-05: 2 via ORAL
  Filled 2012-09-05: qty 2

## 2012-09-05 MED ORDER — ACETAMINOPHEN 325 MG PO TABS
650.0000 mg | ORAL_TABLET | Freq: Once | ORAL | Status: AC
  Administered 2012-09-05: 650 mg via ORAL
  Filled 2012-09-05: qty 2

## 2012-09-05 MED ORDER — SULFAMETHOXAZOLE-TRIMETHOPRIM 800-160 MG PO TABS: 1.0000 | ORAL_TABLET | Freq: Two times a day (BID) | ORAL | Status: DC

## 2012-09-05 NOTE — ED Notes (Signed)
PT. REPORTS HEADACHE WITH NAUSEA ONSET YESTERDAY .

## 2012-09-05 NOTE — ED Provider Notes (Signed)
History    CSN: [id number] Arrival date & time 09/05/12  7829  First MD Initiated Contact with Patient 09/05/12 [phone]     Chief Complaint  Patient presents with  . Headache   (Consider location/radiation/quality/duration/timing/severity/associated sxs/prior Treatment) HPI Kimberly Bishop is a very pleasant 22 yo woman who was born HIV positive and now has AIDS based on her last CD4 count of < 200. She denies history of opportunistic infections.   She presents here today with complaints of a fever, cough, nausea and headache. Her sx began about a   week ago. Her cough has been nonproductive and she denies sob and wheezing. Headache is most prominent throughout the left side of the head and is worse with coughing. Headache comes and goes. Patient says she currently has "a little bit" of a headache.   Fever began yesterday with Tm of 102F.   Patient has chronic nausea. She notes some post tussive emesis but denies spontaneously emesis. No abdominal pain or diarrhea.   When queired, the patient is non-compliant with Bactrim for PCP prophylaxis - "I took it once or twice". She says she takes her antiretroviral medications "off and on because they make my stomach hurt." Past Medical History  Diagnosis Date  . Immune deficiency disorder   . HIV (human immunodeficiency virus infection)   . 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 Appendectomy  2011   Family History  Problem Relation Age of Onset  . Adopted: Yes   History  Substance Use Topics  . Smoking status: Never Smoker   . Smokeless tobacco: Never Used  . Alcohol Use: Yes     Comment: occasional   OB History   Grav Para Term Preterm Abortions TAB SAB Ect Mult Living                 Review of Systems Gen: as per hpi, otherwise Eyes: no discharge or drainage, no occular pain or visual changes Nose: no epistaxis or rhinorrhea Mouth: no dental pain, no  sore throat Neck: no neck pain Lungs: As per history of present illness, otherwise negative CV: no chest pain, palpitations, dependent edema or orthopnea Abd: As per history of present illness, otherwise negative GU: no dysuria or gross hematuria MSK: no myalgias or arthralgias Neuro: As per history of present illness,, no focal neurologic deficits Skin: no rash Psyche: negative.  Allergies  Review of patient's allergies indicates no known allergies.  Home Medications   Current Outpatient Rx  Name  Route  Sig  Dispense  Refill  . Darunavir Ethanolate (PREZISTA) 800 MG tablet   Oral   Take 1 tablet (800 mg total) by mouth daily.   30 tablet   5   . emtricitabine-tenofovir (TRUVADA) 200-300 MG per tablet   Oral   Take 1 tablet by mouth daily.   30 tablet   5   . ondansetron (ZOFRAN) 4 MG tablet   Oral   Take 1 tablet (4 mg total) by mouth daily. Take before ARVs   30 tablet   3   . ritonavir (NORVIR) 100 MG TABS   Oral   Take 1 tablet (100 mg total) by mouth daily.   30 tablet   5   . sulfamethoxazole-trimethoprim (BACTRIM DS) 800-160 MG per tablet   Oral   Take 1 tablet by mouth daily.   30 tablet   5    BP 144/79  Pulse 118  Temp(Src) 101.6 F (38.7 C) (Oral)  Resp 14  SpO2 96%  LMP 08/31/2012 Physical Exam Gen: well developed and well nourished appearing, frequent nonproductive sounding cough is noted during history and physical. Head: NCAT Eyes: PERL, EOMI Nose: no epistaixis or rhinorrhea Mouth/throat: mucosa is moist and pink Neck: supple, no stridor Lungs: CTA B, no wheezing, rhonchi or rales, respiratory rate 24 per minute CV: rapid and regular, pulse 104 bpm, extremities appear well perfused Abd: soft, notender, nondistended Back: no ttp, no cva ttp Skin: no rashese, wnl Neuro: CN ii-xii grossly intact, no focal deficits Psyche; normal affect,  calm and cooperative.   ED Course  Procedures (including critical care time)  Results for  orders placed during the hospital encounter of 09/05/12 (from the past 24 hour(s))  GLUCOSE, CAPILLARY     Status: None   Collection Time    09/05/12  5:49 AM      Result Value Range   Glucose-Capillary 83  70 - 99 mg/dL  CBC WITH DIFFERENTIAL     Status: Abnormal (Preliminary result)   Collection Time    09/05/12  6:15 AM      Result Value Range   WBC 7.6  4.0 - 10.5 K/uL   RBC 4.31  3.87 - 5.11 MIL/uL   Hemoglobin 12.4  12.0 - 15.0 g/dL   HCT 74.2 (*) 59.5 - 63.8 %   MCV 81.7  78.0 - 100.0 fL   MCH 28.8  26.0 - 34.0 pg   MCHC 35.2  30.0 - 36.0 g/dL   RDW 75.6  43.3 - 29.5 %   Platelets PENDING  150 - 400 K/uL   Neutrophils Relative % 87 (*) 43 - 77 %   Neutro Abs 6.6  1.7 - 7.7 K/uL   Lymphocytes Relative 4 (*) 12 - 46 %   Lymphs Abs 0.3 (*) 0.7 - 4.0 K/uL   Monocytes Relative 8  3 - 12 %   Monocytes Absolute 0.6  0.1 - 1.0 K/uL   Eosinophils Relative 0  0 - 5 %   Eosinophils Absolute 0.0  0.0 - 0.7 K/uL   Basophils Relative 0  0 - 1 %   Basophils Absolute 0.0  0.0 - 0.1 K/uL     MDM  ddx - pcp pneumonia, cap, bronchitis, pneumonitis, viral infection/URI. Labs and CXR pending.  0722 - CBC unremarkable. CXR negative for infiltrate. Last CD4 count was 60 in April 2014. I suspect the patient has early PCP pneumonia. She is feeling better and resting pulse is in 80s, sats 100%, BS remain clear and RR is 20 at this time. Patient has been treated empirically with Bactrim. I have a call in to her I.D. Physician, Dr. Luciana Axe to discuss disposition.   46 - Case discussed with Dr. Luciana Axe. He is on board with plan for outpatient treatment with Bactrim DS and will see patient in the office tomorrow for follow up. I explained the dx to the patient and emphasized the essential importance of antibiotic compliance. The patient agrees to f/u with Dr. Luciana Axe and to return to the ED for SOB or any other urgent health concerns.   Brandt Loosen, MD 09/05/12 [phone]

## 2012-09-05 NOTE — ED Notes (Signed)
Pt c/o headache rated 9/10, but has tv volume loud appears to be in NAD.

## 2012-09-06 ENCOUNTER — Telehealth: Payer: Self-pay | Admitting: *Deleted

## 2012-09-06 ENCOUNTER — Ambulatory Visit: Payer: Self-pay | Admitting: Internal Medicine

## 2012-09-06 NOTE — Telephone Encounter (Signed)
Called Mom, (emergency contact) and asked her to have [place] call Dr. Ephriam Knuckles office. Her phone is not in service and she no showed her appt today. Kimberly Bishop

## 2012-09-10 ENCOUNTER — Encounter: Payer: Self-pay | Admitting: Internal Medicine

## 2012-09-10 ENCOUNTER — Ambulatory Visit (INDEPENDENT_AMBULATORY_CARE_PROVIDER_SITE_OTHER): Payer: Self-pay | Admitting: Internal Medicine

## 2012-09-10 ENCOUNTER — Encounter: Payer: Self-pay | Admitting: *Deleted

## 2012-09-10 VITALS — BP 125/83 | HR 88 | Temp 98.3°F | Wt 103.0 lb

## 2012-09-10 DIAGNOSIS — R112 Nausea with vomiting, unspecified: Secondary | ICD-10-CM

## 2012-09-10 DIAGNOSIS — B20 Human immunodeficiency virus [HIV] disease: Secondary | ICD-10-CM

## 2012-09-10 DIAGNOSIS — B59 Pneumocystosis: Secondary | ICD-10-CM

## 2012-09-10 MED ORDER — SULFAMETHOXAZOLE-TMP DS 800-160 MG PO TABS: 2.0000 | ORAL_TABLET | Freq: Three times a day (TID) | ORAL | Status: DC

## 2012-09-10 MED ORDER — DOLUTEGRAVIR SODIUM 50 MG PO TABS: 50.0000 mg | ORAL_TABLET | Freq: Every day | ORAL | Status: DC

## 2012-09-10 NOTE — Assessment & Plan Note (Signed)
I suspect she has PCP pneumonia versus a viral illness. I am going to treat her empirically with Bactrim and the pharmacy was called so she knows to go pick it up now. Since she is not hypoxic, I am not going to give her prednisone, particularly since she has difficulty swallowing pills. She is going to focus on her antiretroviral therapy and Bactrim. She knows to call if she is not better by the end of this week or she gets worse. Otherwise I will see her in 2 weeks.

## 2012-09-10 NOTE — Assessment & Plan Note (Signed)
She already has Zofran and was instructed to take it prior to her medications if it continues to make her feel nauseous.

## 2012-09-10 NOTE — Progress Notes (Signed)
  Subjective:    Patient ID: Kimberly Bishop, female    DOB: November 17, 1990, 22 y.o.   MRN: [id number]  Cough Associated symptoms include a fever. Pertinent negatives include no chills, headaches, myalgias, rash, sore throat, shortness of breath or wheezing.   She comes in for followup of her HIV and recent emergency room visit. She had significant congestion and cough. She was diagnosed with possible PCP pneumonia with her cough, congestion.  She did have a fever of 101 in the ER and a 102 at home.  She was given therapy for PCP pneumonia with Bactrim however she did not fill it. She comes in here after missing her follow up appointment last week with continued cough and congestion. She also tells me she has not been taking her antiretroviral medicines daily, missing several doses a week.  No diarrhea, no weight loss.  She tells me she has difficulty swallowing pills. This is particularly difficult with Norvir, which she points to on the chart   Review of Systems  Constitutional: Positive for fever, activity change, appetite change and fatigue. Negative for chills and unexpected weight change.  HENT: Negative for sore throat and trouble swallowing.   Respiratory: Positive for cough. Negative for shortness of breath and wheezing.   Gastrointestinal: Positive for nausea and abdominal pain. Negative for diarrhea.       Some abdominal pain after taking her medicines  Musculoskeletal: Negative for myalgias and arthralgias.  Skin: Negative for rash.  Neurological: Negative for dizziness, light-headedness and headaches.  Hematological: Negative for adenopathy.       Objective:   Physical Exam  Constitutional: She appears well-developed and well-nourished. No distress.  Cardiovascular: Normal rate, regular rhythm and normal heart sounds.   No murmur heard. Pulmonary/Chest:  Congested sounding, however she is not in any acute distress. O2 sat is 96% on room  Lymphadenopathy:    She has no  cervical adenopathy.  Skin: Skin is warm and dry. No rash noted.          Assessment & Plan:

## 2012-09-10 NOTE — Assessment & Plan Note (Signed)
I have again discussed the importance of therapy. I did discuss with her the problems with her medications and will change her to tivicay with Truvada and stop the Norvir and Prezista. The pharmacy was called and she is going to pick this up today.

## 2012-09-11 LAB — CULTURE, BLOOD (ROUTINE X 2): Culture: NO GROWTH

## 2012-09-27 ENCOUNTER — Encounter: Payer: Self-pay | Admitting: Internal Medicine

## 2012-09-27 ENCOUNTER — Ambulatory Visit (INDEPENDENT_AMBULATORY_CARE_PROVIDER_SITE_OTHER): Payer: Self-pay | Admitting: Internal Medicine

## 2012-09-27 VITALS — BP 104/67 | HR 67 | Temp 98.7°F | Ht 62.0 in | Wt 103.0 lb

## 2012-09-27 DIAGNOSIS — B20 Human immunodeficiency virus [HIV] disease: Secondary | ICD-10-CM

## 2012-09-27 DIAGNOSIS — R112 Nausea with vomiting, unspecified: Secondary | ICD-10-CM

## 2012-09-27 DIAGNOSIS — B59 Pneumocystosis: Secondary | ICD-10-CM

## 2012-09-27 MED ORDER — SULFAMETHOXAZOLE-TMP DS 800-160 MG PO TABS: 1.0000 | ORAL_TABLET | Freq: Every day | ORAL | Status: DC

## 2012-09-27 NOTE — Progress Notes (Signed)
  Subjective:    Patient ID: Kimberly Bishop, female    DOB: December 17, 1990, 22 y.o.   MRN: [id number]  HPI She comes in here for followup. She was seen 2 weeks ago with cough and shortness of breath after an emergency room visit. She was started on Bactrim for presumed PCP pneumonia versus a viral illness. She now feels much better with just a residual cough but no shortness of breath or other issues. She was also given Zofran for nausea and has no further issues with nausea. She continues to take her medication daily and now has no problems with it. No weight loss, diarrhea, no rashes.   Review of Systems  Constitutional: Negative for fever, appetite change and fatigue.  HENT: Negative for sore throat and trouble swallowing.   Eyes: Negative for visual disturbance.  Respiratory: Positive for cough. Negative for shortness of breath.   Cardiovascular: Negative for chest pain.  Gastrointestinal: Negative for nausea.  Musculoskeletal: Negative for myalgias and arthralgias.  Skin: Negative for rash.  Neurological: Negative for dizziness, light-headedness and headaches.  Hematological: Negative for adenopathy.  Psychiatric/Behavioral: Negative for dysphoric mood.       Objective:   Physical Exam  Constitutional: She appears well-developed and well-nourished. No distress.  HENT:  Mouth/Throat: Oropharynx is clear and moist. No oropharyngeal exudate.  Eyes: Right eye exhibits no discharge. Left eye exhibits no discharge. No scleral icterus.  Cardiovascular: Normal rate, regular rhythm and normal heart sounds.   No murmur heard. Pulmonary/Chest: Effort normal and breath sounds normal. No respiratory distress. She has no wheezes.  Lymphadenopathy:    She has no cervical adenopathy.  Skin: Skin is warm and dry. No rash noted.          Assessment & Plan:

## 2012-09-27 NOTE — Assessment & Plan Note (Signed)
She will continue with her treatment course and then convert to daily prophylaxis.

## 2012-09-27 NOTE — Assessment & Plan Note (Signed)
This has resolved.

## 2012-09-27 NOTE — Assessment & Plan Note (Signed)
She is doing well on her medicine and I will have her check her labs on Monday when she has time to schedule them. She will then followup in 2 months.

## 2012-10-01 ENCOUNTER — Other Ambulatory Visit: Payer: Self-pay

## 2012-10-11 ENCOUNTER — Encounter: Payer: Self-pay | Admitting: Internal Medicine

## 2012-10-11 ENCOUNTER — Ambulatory Visit (INDEPENDENT_AMBULATORY_CARE_PROVIDER_SITE_OTHER): Payer: Self-pay | Admitting: Internal Medicine

## 2012-10-11 ENCOUNTER — Ambulatory Visit: Payer: Self-pay

## 2012-10-11 VITALS — BP 127/78 | HR 67 | Temp 98.5°F | Ht 62.0 in | Wt 103.0 lb

## 2012-10-11 DIAGNOSIS — B20 Human immunodeficiency virus [HIV] disease: Secondary | ICD-10-CM

## 2012-10-11 DIAGNOSIS — R112 Nausea with vomiting, unspecified: Secondary | ICD-10-CM

## 2012-10-11 DIAGNOSIS — B59 Pneumocystosis: Secondary | ICD-10-CM

## 2012-10-11 LAB — COMPLETE METABOLIC PANEL WITH GFR
Albumin: 4.6 g/dL (ref 3.5–5.2)
BUN: 12 mg/dL (ref 6–23)
Calcium: 9.7 mg/dL (ref 8.4–10.5)
Chloride: 106 mEq/L (ref 96–112)
GFR, Est Non African American: >89 mL/min
Glucose, Bld: 86 mg/dL (ref 70–99)
Potassium: 4.2 mEq/L (ref 3.5–5.3)

## 2012-10-11 LAB — CBC WITH DIFFERENTIAL/PLATELET
Basophils Absolute: 0 10*3/uL (ref 0.0–0.1)
Lymphocytes Relative: 11 % — ABNORMAL LOW (ref 12–46)
Neutro Abs: 3.7 10*3/uL (ref 1.7–7.7)
Neutrophils Relative %: 77 % (ref 43–77)
Platelets: 174 10*3/uL (ref 150–400)
RDW: 14.8 % (ref 11.5–15.5)
WBC: 4.8 10*3/uL (ref 4.0–10.5)

## 2012-10-11 MED ORDER — SULFAMETHOXAZOLE-TMP DS 800-160 MG PO TABS: 1.0000 | ORAL_TABLET | Freq: Every day | ORAL | Status: DC

## 2012-10-11 NOTE — Progress Notes (Signed)
  Subjective:    Patient ID: Kimberly Bishop, female    DOB: 12/10/1990, 22 y.o.   MRN: [id number]  HPI She comes in for followup of her HIV. She was reestablishing care for her HIV with [place] after being noncompliant. She then was seen in the emergency room after establishing and treated empirically for PCP pneumonia. She started on her regimen of tivicay and Truvada and she tells me that she misses occasional doses but otherwise is taking her medicine and has been for about a month. She feels well and has no complaints. She does not voice any difficulty with the medications, obtain them or come in to her appointments. No breathing problems. No cough or diarrhea.   Review of Systems  Constitutional: Negative for activity change, appetite change and unexpected weight change.  HENT: Negative for sore throat and trouble swallowing.   Eyes: Negative for visual disturbance.  Respiratory: Negative for cough and shortness of breath.   Cardiovascular: Negative for chest pain.  Gastrointestinal: Negative for nausea, abdominal pain and diarrhea.  Musculoskeletal: Negative for myalgias and arthralgias.  Skin: Negative for rash.  Neurological: Negative for dizziness, light-headedness and headaches.  Hematological: Negative for adenopathy.  Psychiatric/Behavioral: Negative for dysphoric mood.       Objective:   Physical Exam  Constitutional: She is oriented to person, place, and time. She appears well-developed and well-nourished. No distress.  HENT:  Mouth/Throat: No oropharyngeal exudate.  Eyes: Right eye exhibits no discharge. Left eye exhibits no discharge. No scleral icterus.  Cardiovascular: Normal rate, regular rhythm and normal heart sounds.   No murmur heard. Pulmonary/Chest: Effort normal and breath sounds normal. No respiratory distress.  Lymphadenopathy:    She has no cervical adenopathy.  Neurological: She is alert and oriented to person, place, and time.  Skin: No rash noted.   Psychiatric:  Flat affect          Assessment & Plan:

## 2012-10-11 NOTE — Assessment & Plan Note (Signed)
She says she is taking her medicine well though does endorse some missed doses. I will check her labs today and she will return in 2 months. If there any concerns she will return sooner.

## 2012-10-11 NOTE — Assessment & Plan Note (Signed)
This has resolved. She now will take Bactrim prophylaxis daily.

## 2012-10-11 NOTE — Assessment & Plan Note (Signed)
This has resolved.

## 2012-10-12 LAB — T-HELPER CELL (CD4) - (RCID CLINIC ONLY): CD4 % Helper T Cell: 15 % — ABNORMAL LOW (ref 33–55)

## 2012-10-17 LAB — AFB CULTURE, BLOOD

## 2012-10-22 ENCOUNTER — Ambulatory Visit: Payer: Self-pay

## 2012-10-22 ENCOUNTER — Emergency Department (HOSPITAL_COMMUNITY): Payer: Medicaid Other

## 2012-10-22 ENCOUNTER — Emergency Department (HOSPITAL_COMMUNITY)
Admission: EM | Admit: 2012-10-22 | Discharge: 2012-10-22 | Disposition: A | Discharge status: Home / Self Care | Payer: Medicaid Other | Attending: Emergency Medicine | Admitting: Emergency Medicine

## 2012-10-22 ENCOUNTER — Encounter (HOSPITAL_COMMUNITY): Payer: Self-pay | Admitting: *Deleted

## 2012-10-22 DIAGNOSIS — B20 Human immunodeficiency virus [HIV] disease: Secondary | ICD-10-CM | POA: Insufficient documentation

## 2012-10-22 DIAGNOSIS — O2341 Unspecified infection of urinary tract in pregnancy, first trimester: Secondary | ICD-10-CM

## 2012-10-22 DIAGNOSIS — R42 Dizziness and giddiness: Secondary | ICD-10-CM | POA: Insufficient documentation

## 2012-10-22 DIAGNOSIS — M545 Low back pain, unspecified: Secondary | ICD-10-CM | POA: Insufficient documentation

## 2012-10-22 DIAGNOSIS — R109 Unspecified abdominal pain: Secondary | ICD-10-CM | POA: Insufficient documentation

## 2012-10-22 DIAGNOSIS — O2390 Unspecified genitourinary tract infection in pregnancy, unspecified trimester: Secondary | ICD-10-CM | POA: Insufficient documentation

## 2012-10-22 DIAGNOSIS — Z79899 Other long term (current) drug therapy: Secondary | ICD-10-CM | POA: Insufficient documentation

## 2012-10-22 DIAGNOSIS — N390 Urinary tract infection, site not specified: Secondary | ICD-10-CM | POA: Insufficient documentation

## 2012-10-22 DIAGNOSIS — D849 Immunodeficiency, unspecified: Secondary | ICD-10-CM | POA: Insufficient documentation

## 2012-10-22 DIAGNOSIS — Z3201 Encounter for pregnancy test, result positive: Secondary | ICD-10-CM | POA: Insufficient documentation

## 2012-10-22 DIAGNOSIS — N72 Inflammatory disease of cervix uteri: Secondary | ICD-10-CM | POA: Insufficient documentation

## 2012-10-22 LAB — CBC
Hemoglobin: 13.8 g/dL (ref 12.0–15.0)
MCHC: 36.9 g/dL — ABNORMAL HIGH (ref 30.0–36.0)
Platelets: 149 10*3/uL — ABNORMAL LOW (ref 150–400)
RDW: 13.5 % (ref 11.5–15.5)

## 2012-10-22 LAB — POCT I-STAT, CHEM 8
BUN: 8 mg/dL (ref 6–23)
Calcium, Ion: 1.31 mmol/L — ABNORMAL HIGH (ref 1.12–1.23)
Chloride: 106 mEq/L (ref 96–112)
Creatinine, Ser: 0.9 mg/dL (ref 0.50–1.10)
Glucose, Bld: 79 mg/dL (ref 70–99)
HCT: 43 % (ref 36.0–46.0)
Hemoglobin: 14.6 g/dL (ref 12.0–15.0)
Potassium: 3.9 mEq/L (ref 3.5–5.1)
Sodium: 139 mEq/L (ref 135–145)
TCO2: 22 mmol/L (ref 0–100)

## 2012-10-22 LAB — URINE MICROSCOPIC-ADD ON

## 2012-10-22 LAB — WET PREP, GENITAL
Trich, Wet Prep: NONE SEEN
Yeast Wet Prep HPF POC: NONE SEEN

## 2012-10-22 LAB — HCG, QUANTITATIVE, PREGNANCY: hCG, Beta Chain, Quant, S: 75 m[IU]/mL — ABNORMAL HIGH (ref <5)

## 2012-10-22 LAB — URINALYSIS, ROUTINE W REFLEX MICROSCOPIC
Glucose, UA: NEGATIVE mg/dL
Ketones, ur: NEGATIVE mg/dL
Nitrite: POSITIVE — AB
Protein, ur: 30 mg/dL — AB

## 2012-10-22 MED ORDER — CEPHALEXIN 500 MG PO CAPS: 500.0000 mg | ORAL_CAPSULE | Freq: Four times a day (QID) | ORAL | Status: DC

## 2012-10-22 MED ORDER — HYDROMORPHONE HCL PF 1 MG/ML IJ SOLN
1.0000 mg | Freq: Once | INTRAMUSCULAR | Status: AC
  Administered 2012-10-22: 1 mg via INTRAVENOUS
  Filled 2012-10-22: qty 1

## 2012-10-22 MED ORDER — AZITHROMYCIN 250 MG PO TABS
1000.0000 mg | ORAL_TABLET | Freq: Once | ORAL | Status: AC
  Administered 2012-10-22: 1000 mg via ORAL
  Filled 2012-10-22: qty 4

## 2012-10-22 MED ORDER — CEFTRIAXONE SODIUM 250 MG IJ SOLR
250.0000 mg | Freq: Once | INTRAMUSCULAR | Status: AC
  Administered 2012-10-22: 250 mg via INTRAMUSCULAR
  Filled 2012-10-22: qty 250

## 2012-10-22 MED ORDER — LIDOCAINE HCL (PF) 1 % IJ SOLN
INTRAMUSCULAR | Status: AC
  Administered 2012-10-22: 0.9 mL
  Filled 2012-10-22: qty 5

## 2012-10-22 NOTE — ED Notes (Addendum)
Pt with swelling to R labia majora since Sat night, states feels like an abscess.  Pt c/o white drainage from abscess.  Denies vaginal discharge. PT HIV positive.

## 2012-10-22 NOTE — ED Provider Notes (Signed)
CSN: [id number]     Arrival date & time 10/22/12  1305 History     First MD Initiated Contact with Patient 10/22/12 1325     Chief Complaint  Patient presents with  . Pelvic Pain   (Consider location/radiation/quality/duration/timing/severity/associated sxs/prior Treatment) HPI Kimberly Bishop is a 22 year old female with PMH of HIV (followed by Dr. Luciana Axe, last CD4 count 90 on 10/11/12, viral load undetectable at that time).  She presents to the ED today for evaluation of a lump on her right labia majora. She reports that she first noticed the area on Saturday night, she notes that it drained some whitish material, it is painful to palpation.  She does report associated symptoms of mild periumbical abdominal pain, low back pain, and dizziness.  She notes that yesterday while at work she became dizzy and light headed and "almost passed out," she reports when she felt that way she quickly sat down and never lost conscienceness. Of note according to Dr. Ephriam Knuckles last note (10/11/12) she has been prescribed Bactrim daily for PCP prophylaxis however she reports she has not taken bactrim in a month. She reports her FDLMP was July 21st, is currently sexually active, denies use of protection, denies current birth control.   Past Medical History  Diagnosis Date  . Immune deficiency disorder   . HIV (human immunodeficiency virus infection)   . 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 Appendectomy  2011   Family History  Problem Relation Age of Onset  . Adopted: Yes   History  Substance Use Topics  . Smoking status: Never Smoker   . Smokeless tobacco: Never Used  . Alcohol Use: Yes     Comment: occasional   OB History   Grav Para Term Preterm Abortions TAB SAB Ect Mult Living                 Review of Systems  Constitutional: Negative for fever, chills, diaphoresis, activity change and appetite change.  HENT:  Negative for congestion and sinus pressure.   Eyes: Negative for visual disturbance.  Respiratory: Negative for chest tightness and shortness of breath.   Cardiovascular: Negative for chest pain and palpitations.  Gastrointestinal: Positive for abdominal pain (periumbical). Negative for nausea, vomiting, diarrhea, constipation, blood in stool and abdominal distention.  Genitourinary: Positive for dysuria and vaginal pain. Negative for frequency, flank pain, vaginal discharge, difficulty urinating and menstrual problem.  Musculoskeletal: Positive for back pain.  Skin: Negative for rash and wound.  Neurological: Positive for dizziness and light-headedness. Negative for tremors, syncope, weakness and headaches.    Allergies  Review of patient's allergies indicates no known allergies.  Home Medications   Current Outpatient Rx  Name  Route  Sig  Dispense  Refill  . dolutegravir (TIVICAY) 50 MG tablet   Oral   Take 1 tablet (50 mg total) by mouth daily. With truvada; stop prezista and norvir   30 tablet   5   . emtricitabine-tenofovir (TRUVADA) 200-300 MG per tablet   Oral   Take 1 tablet by mouth daily.   30 tablet   5    BP 137/87  Temp(Src) 98.4 F (36.9 C) (Oral)  Resp 18  SpO2 100%  LMP 10/01/2012 Physical Exam  Constitutional: She is oriented to person, place, and time. She appears well-developed and well-nourished. No distress.  HENT:  Head: Normocephalic and atraumatic.  Eyes: EOM are normal. Pupils are equal, round,  and reactive to light.  Cardiovascular: Normal rate, regular rhythm and normal heart sounds.   No murmur heard. Pulmonary/Chest: Effort normal and breath sounds normal. No respiratory distress. She has no wheezes. She has no rales.  Abdominal: Soft. Bowel sounds are normal. She exhibits no distension and no mass. There is tenderness (mild TTP periumbical area.). There is no guarding.  Genitourinary: There is tenderness on the right labia. There is no rash  or lesion on the right labia. There is no rash or lesion on the left labia. There is tenderness around the vagina. No bleeding around the vagina.  No cervical motion tenderness, no adnexal mass palpated.  + yellowish discharge  Neurological: She is alert and oriented to person, place, and time.  Skin: Skin is warm and dry.    ED Course   Procedures (including critical care time)  Labs Reviewed  WET PREP, GENITAL - Abnormal; Notable for the following:    WBC, Wet Prep HPF POC MODERATE (*)    All other components within normal limits  URINALYSIS, ROUTINE W REFLEX MICROSCOPIC - Abnormal; Notable for the following:    APPearance CLOUDY (*)    Hgb urine dipstick MODERATE (*)    Protein, ur 30 (*)    Nitrite POSITIVE (*)    Leukocytes, UA LARGE (*)    All other components within normal limits  HCG, QUANTITATIVE, PREGNANCY - Abnormal; Notable for the following:    hCG, Beta Chain, Quant, S 75 (*)    All other components within normal limits  CBC - Abnormal; Notable for the following:    MCHC 36.9 (*)    Platelets 149 (*)    All other components within normal limits  URINE MICROSCOPIC-ADD ON - Abnormal; Notable for the following:    Squamous Epithelial / LPF MANY (*)    Bacteria, UA MANY (*)    All other components within normal limits  POCT PREGNANCY, URINE - Abnormal; Notable for the following:    Preg Test, Ur POSITIVE (*)    All other components within normal limits  POCT I-STAT, CHEM 8 - Abnormal; Notable for the following:    Calcium, Ion 1.31 (*)    All other components within normal limits  GC/CHLAMYDIA PROBE AMP  URINE CULTURE   US Ob Comp Less 14 Wks  10/22/2012   *RADIOLOGY REPORT*  Clinical Data: Pelvic pain  OBSTETRIC <14 WK [place] AND TRANSVAGINAL OB US  Technique:  Both transabdominal and transvaginal ultrasound examinations were performed for complete evaluation of the gestation as well as the maternal uterus, adnexal regions, and pelvic cul-de-sac.  Transvaginal  technique was performed to assess early pregnancy.  Comparison:  None.  No intrauterine pregnancy is identified.  Maternal uterus/adnexae: The uterus is grossly unremarkable. The endometrium measures up to 9 mm.  The right ovary is normal in appearance measures 2.7 x 1.3 x 2.0 cm.  The left ovary is normal in appearance and measures 3.0 x 1.6 x 2.0 cm.  There is a moderate amount of free fluid within the pelvis.  This does not extend into the upper abdomen.  IMPRESSION:  1. No definite intrauterine pregnancy is identified.  Differential considerations include early IUP, failed IUP or ectopic pregnancy. Recommend continued clinical follow-up and serial beta HCGs.  2. Moderate amount of fluid in the pelvis.  Discussed with Dr. Criss Alvine at 4:55 p.m. on 10/22/2012.   Original Report Authenticated By: Annia Belt, M.D   US Ob Transvaginal  10/22/2012   *RADIOLOGY REPORT*  Clinical Data: Pelvic pain  OBSTETRIC <14 WK [place] AND TRANSVAGINAL OB US  Technique:  Both transabdominal and transvaginal ultrasound examinations were performed for complete evaluation of the gestation as well as the maternal uterus, adnexal regions, and pelvic cul-de-sac.  Transvaginal technique was performed to assess early pregnancy.  Comparison:  None.  No intrauterine pregnancy is identified.  Maternal uterus/adnexae: The uterus is grossly unremarkable. The endometrium measures up to 9 mm.  The right ovary is normal in appearance measures 2.7 x 1.3 x 2.0 cm.  The left ovary is normal in appearance and measures 3.0 x 1.6 x 2.0 cm.  There is a moderate amount of free fluid within the pelvis.  This does not extend into the upper abdomen.  IMPRESSION:  1. No definite intrauterine pregnancy is identified.  Differential considerations include early IUP, failed IUP or ectopic pregnancy. Recommend continued clinical follow-up and serial beta HCGs.  2. Moderate amount of fluid in the pelvis.  Discussed with Dr. Criss Alvine at 4:55 p.m. on 10/22/2012.    Original Report Authenticated By: Annia Belt, M.D   1. UTI (urinary tract infection) in pregnancy in first trimester   2. Cervicitis     MDM  22 year old female with PMH of HIV, last CD4 count of 90, thus meeting criteria for AIDS.  Patient has recently been restarted on HIV medications and report compliance, her viral load is currently undectectable.  She has not been compliant with Bactrim prophylaxes.  And currently reports symptoms consistent with a vaginal abscess. -Upreg-> positive -U/A- Many Squamous cells, 7-10 RBC, Too numerous to count WBC, many bacteria, positive nitrites.  UCx P. 2:30pm -hCG quantitative- 75 -Istat chem 8- wnl -CBC-wnl -Wet prep shows only WBCs -GC/ Chlamydia probe -OB U/S- No definite IUP, mod amount of fluid in pelvis.  Patient's abdominal pain has resolved, no obvious IUP on U/S. Will need serial hCG and close follow up by GYN.  Due to patient's history of AIDs and her vaginal tenderness and discharge will give Azithromycin and Rocephin in ED to treat cervicitis.  Will give patient prescription for 10 days of treatment of keflex for UTI which will also cover for possible skin abscess.  Patient was instructed to return to ED for worsening pain or vaginal bleeding.  Carlynn Purl, DO 10/22/12 [phone]

## 2012-10-23 NOTE — ED Provider Notes (Signed)
I saw and evaluated the patient, reviewed the resident's note and I agree with the findings and plan.   Pelvic performed by me, no adnexal, uterine or CMT. Initial exam had vague tenderness, but on re-examinations her abd tenderness had resolved, and HR normalized. With amt of vaginal discharge, will treat for cervicitis, and treat UTI given her pregnancy. D/w OB/GYN on call, will have patient f/u in 1-2 days with her gyn for close eval. Differential still includes early ectopic, but at this time she is stable w/o abd pain and feel she can be closely followed as outpatient with good return precautions.  Audree Camel, MD 10/23/12 1330

## 2012-10-24 LAB — URINE CULTURE: Colony Count: >=100000

## 2012-10-25 ENCOUNTER — Ambulatory Visit (INDEPENDENT_AMBULATORY_CARE_PROVIDER_SITE_OTHER): Payer: Medicaid Other | Admitting: Internal Medicine

## 2012-10-25 ENCOUNTER — Encounter: Payer: Self-pay | Admitting: Internal Medicine

## 2012-10-25 VITALS — BP 121/81 | HR 91 | Temp 98.4°F | Ht 62.0 in | Wt 102.0 lb

## 2012-10-25 DIAGNOSIS — B20 Human immunodeficiency virus [HIV] disease: Secondary | ICD-10-CM

## 2012-10-25 DIAGNOSIS — R300 Dysuria: Secondary | ICD-10-CM

## 2012-10-25 MED ORDER — ATAZANAVIR SULFATE 300 MG PO CAPS: 300.0000 mg | ORAL_CAPSULE | Freq: Every day | ORAL | Status: DC

## 2012-10-25 MED ORDER — RITONAVIR 100 MG PO TABS: 100.0000 mg | ORAL_TABLET | Freq: Every day | ORAL | Status: DC

## 2012-10-25 MED ORDER — DAPSONE 100 MG PO TABS: 100.0000 mg | ORAL_TABLET | Freq: Every day | ORAL | Status: DC

## 2012-10-25 NOTE — Progress Notes (Signed)
  Subjective:    Patient ID: Kimberly Bishop, female    DOB: 1990-09-04, 22 y.o.   MRN: [id number]  HPI She comes in for a work in visit after an ED visit for dysuria and noted to be pregnant.  Did not want to be pregnant but not interested in termination.  Has an appt with OB next month.  Taking Tivicay and Truvada.  Here with partner who is aware of her status and he is negative.  She continues to have dysuria and vaginal dyscomfort and did not fill her Keflex prescribed from ED.  No fever, no chills.     Review of Systems  Constitutional: Negative for fever, activity change, appetite change, fatigue and unexpected weight change.  HENT: Negative for sore throat and trouble swallowing.   Eyes: Negative for visual disturbance.  Respiratory: Negative for cough and shortness of breath.   Cardiovascular: Negative for leg swelling.  Gastrointestinal: Negative for nausea, abdominal pain and diarrhea.  Genitourinary: Positive for vaginal pain. Negative for genital sores.  Musculoskeletal: Negative for myalgias and arthralgias.  Skin: Negative for rash.  Allergic/Immunologic: Positive for immunocompromised state.  Neurological: Negative for dizziness, light-headedness and headaches.  Hematological: Negative for adenopathy.  Psychiatric/Behavioral: Negative for dysphoric mood.       Objective:   Physical Exam  Constitutional: She appears well-nourished. No distress.  HENT:  Mouth/Throat: No oropharyngeal exudate.  Eyes: Right eye exhibits no discharge. Left eye exhibits no discharge. No scleral icterus.  Cardiovascular: Normal rate, regular rhythm and normal heart sounds.   No murmur heard. Pulmonary/Chest: Effort normal and breath sounds normal. No respiratory distress. She has no wheezes.  Lymphadenopathy:    She has no cervical adenopathy.  Skin: Skin is warm and dry. No rash noted.          Assessment & Plan:

## 2012-10-25 NOTE — Assessment & Plan Note (Signed)
Cultures noted.  She is going to take Keflex.  If no improvement in 2-3 days, she will go to [facility] for evaluation of ? Bartholins cyst/abscess.

## 2012-10-25 NOTE — Assessment & Plan Note (Signed)
I am going to change her to Reyatz/norvir with Truvada.  She will go to pharmacy today.

## 2012-10-27 ENCOUNTER — Telehealth (HOSPITAL_COMMUNITY): Payer: Self-pay | Admitting: Emergency Medicine

## 2012-10-27 NOTE — ED Notes (Signed)
Post ED Visit - Positive Culture Follow-up  Culture report reviewed by antimicrobial stewardship pharmacist: []  Wes [place], Pharm.D., BCPS [x]  Celedonio Miyamoto, Pharm.D., BCPS []  Georgina Pillion, Pharm.D., BCPS []  [place], [place].D., BCPS, AAHIVP []  Estella Husk, Pharm.D., BCPS, AAHIVP  Positive urine culture Treated with Keflex, organism sensitive to the same and no further patient follow-up is required at this time.  Kylie A Holland 10/27/2012, 10:08 AM

## 2012-11-05 ENCOUNTER — Telehealth: Payer: Self-pay | Admitting: Licensed Clinical Social Worker

## 2012-11-05 NOTE — Telephone Encounter (Signed)
Patient called stating that she has yellow eyes, (recently started Reyatez) has fever off and on throughout the day of 102 for 2 days, and she is vomiting every time she takes her medication. I urged her to go to the hospital, and she states she could not go today. I added her to Dr. Ephriam Knuckles schedule for tomorrow at 10 am

## 2012-11-06 ENCOUNTER — Ambulatory Visit (INDEPENDENT_AMBULATORY_CARE_PROVIDER_SITE_OTHER): Payer: Medicaid Other | Admitting: Internal Medicine

## 2012-11-06 ENCOUNTER — Encounter: Payer: Self-pay | Admitting: Internal Medicine

## 2012-11-06 VITALS — BP 124/77 | HR 87 | Temp 98.7°F | Ht 62.0 in | Wt 102.0 lb

## 2012-11-06 DIAGNOSIS — B20 Human immunodeficiency virus [HIV] disease: Secondary | ICD-10-CM

## 2012-11-06 DIAGNOSIS — O210 Mild hyperemesis gravidarum: Secondary | ICD-10-CM | POA: Insufficient documentation

## 2012-11-06 MED ORDER — ONDANSETRON HCL 4 MG PO TABS: 4.0000 mg | ORAL_TABLET | Freq: Three times a day (TID) | ORAL | Status: DC | PRN

## 2012-11-06 NOTE — Progress Notes (Signed)
  Subjective:    Patient ID: Kimberly Bishop, female    DOB: 04/25/1990, 22 y.o.   MRN: [id number]  HPI She comes in for a work in visit.  She was recently discovered to be pregnant and is again here with her partner and the father of the baby.  She previously had a complaint of vaginal swelling and UTI and did get her antibiotic and resolved.  She now has had n/v, and noted yellowing eyes.  No weight loss.  Has appt with OB in 2 days and is aware.     Review of Systems  Constitutional: Negative for fever, fatigue and unexpected weight change.  HENT: Negative for sore throat and trouble swallowing.   Eyes: Negative for visual disturbance.  Respiratory: Negative for shortness of breath.   Cardiovascular: Negative for chest pain and leg swelling.  Gastrointestinal: Positive for nausea and vomiting. Negative for abdominal pain and diarrhea.  Musculoskeletal: Negative for myalgias and arthralgias.  Skin: Negative for rash.  Allergic/Immunologic: Positive for immunocompromised state.  Neurological: Negative for dizziness, light-headedness and headaches.  Hematological: Negative for adenopathy.  Psychiatric/Behavioral: Negative for dysphoric mood.       Objective:   Physical Exam  Constitutional: She appears well-developed and well-nourished. No distress.  HENT:  Mouth/Throat: No oropharyngeal exudate.  Eyes: Scleral icterus is present.  Cardiovascular: Normal rate, regular rhythm and normal heart sounds.   No murmur heard. Pulmonary/Chest: Effort normal and breath sounds normal. No respiratory distress.  Lymphadenopathy:    She has no cervical adenopathy.  Skin: No rash noted.          Assessment & Plan:

## 2012-11-06 NOTE — Assessment & Plan Note (Signed)
I suspect this as the cause.  I will try zofran.  To take it 30 minutes prior to her ARVs.

## 2012-11-06 NOTE — Assessment & Plan Note (Signed)
Here icterus is from the Reyataz and is a benign side effect.  Patient educated.  May consider switching back after pregnancy.

## 2012-11-08 ENCOUNTER — Encounter: Payer: Medicaid Other | Admitting: Obstetrics & Gynecology

## 2012-11-22 ENCOUNTER — Other Ambulatory Visit: Payer: Medicaid Other

## 2012-11-26 ENCOUNTER — Other Ambulatory Visit: Payer: Medicaid Other

## 2012-11-26 ENCOUNTER — Ambulatory Visit (INDEPENDENT_AMBULATORY_CARE_PROVIDER_SITE_OTHER): Payer: Medicaid Other | Admitting: *Deleted

## 2012-11-26 DIAGNOSIS — B20 Human immunodeficiency virus [HIV] disease: Secondary | ICD-10-CM

## 2012-11-26 DIAGNOSIS — Z3490 Encounter for supervision of normal pregnancy, unspecified, unspecified trimester: Secondary | ICD-10-CM

## 2012-11-26 DIAGNOSIS — Z331 Pregnant state, incidental: Secondary | ICD-10-CM

## 2012-11-26 LAB — CBC WITH DIFFERENTIAL/PLATELET
Basophils Absolute: 0 10*3/uL (ref 0.0–0.1)
Basophils Relative: 0 % (ref 0–1)
Eosinophils Absolute: 0.1 10*3/uL (ref 0.0–0.7)
Eosinophils Relative: 3 % (ref 0–5)
HCT: 36.6 % (ref 36.0–46.0)
Hemoglobin: 12.6 g/dL (ref 12.0–15.0)
MCH: 31.8 pg (ref 26.0–34.0)
MCHC: 34.4 g/dL (ref 30.0–36.0)
Monocytes Absolute: 0.3 10*3/uL (ref 0.1–1.0)
Monocytes Relative: 9 % (ref 3–12)
Neutro Abs: 2.4 10*3/uL (ref 1.7–7.7)
RDW: 15.6 % — ABNORMAL HIGH (ref 11.5–15.5)

## 2012-11-26 LAB — COMPLETE METABOLIC PANEL WITH GFR
ALT: 16 U/L (ref 0–35)
Albumin: 4.3 g/dL (ref 3.5–5.2)
Alkaline Phosphatase: 78 U/L (ref 39–117)
CO2: 24 mEq/L (ref 19–32)
GFR, Est Non African American: >89 mL/min
Glucose, Bld: 77 mg/dL (ref 70–99)
Potassium: 4 mEq/L (ref 3.5–5.3)
Sodium: 136 mEq/L (ref 135–145)
Total Protein: 7.7 g/dL (ref 6.0–8.3)

## 2012-11-26 NOTE — Progress Notes (Signed)
Pt is pregnant .  Will not do a pap smear.  Has OB appt/ 11/29/12.  Will send pt to lab for blood draws for Dr. Ephriam Knuckles visit on 12/06/12.Marland Kitchen

## 2012-11-27 LAB — T-HELPER CELL (CD4) - (RCID CLINIC ONLY): CD4 % Helper T Cell: 16 % — ABNORMAL LOW (ref 33–55)

## 2012-11-27 LAB — HIV-1 RNA QUANT-NO REFLEX-BLD: HIV-1 RNA Quant, Log: 2.11 {Log} — ABNORMAL HIGH (ref <1.30)

## 2012-11-29 ENCOUNTER — Encounter: Payer: Self-pay | Admitting: Obstetrics & Gynecology

## 2012-11-29 ENCOUNTER — Other Ambulatory Visit (HOSPITAL_COMMUNITY)
Admission: RE | Admit: 2012-11-29 | Discharge: 2012-11-29 | Disposition: A | Discharge status: Home / Self Care | Payer: Medicaid Other | Source: Ambulatory Visit | Attending: Obstetrics & Gynecology | Admitting: Obstetrics & Gynecology

## 2012-11-29 ENCOUNTER — Ambulatory Visit (INDEPENDENT_AMBULATORY_CARE_PROVIDER_SITE_OTHER): Payer: Medicaid Other | Admitting: Obstetrics & Gynecology

## 2012-11-29 VITALS — BP 110/74 | Temp 98.5°F | Wt 105.0 lb

## 2012-11-29 DIAGNOSIS — O021 Missed abortion: Secondary | ICD-10-CM | POA: Insufficient documentation

## 2012-11-29 DIAGNOSIS — Z01419 Encounter for gynecological examination (general) (routine) without abnormal findings: Secondary | ICD-10-CM | POA: Insufficient documentation

## 2012-11-29 DIAGNOSIS — O0991 Supervision of high risk pregnancy, unspecified, first trimester: Secondary | ICD-10-CM

## 2012-11-29 DIAGNOSIS — O210 Mild hyperemesis gravidarum: Secondary | ICD-10-CM

## 2012-11-29 LAB — POCT URINALYSIS DIP (DEVICE)
Ketones, ur: NEGATIVE mg/dL
Protein, ur: NEGATIVE mg/dL
Specific Gravity, Urine: 1.025 (ref 1.005–1.030)
Urobilinogen, UA: 0.2 mg/dL (ref 0.0–1.0)
pH: 7 (ref 5.0–8.0)

## 2012-11-29 NOTE — Addendum Note (Signed)
Addended by: Franchot Mimes on: 11/29/2012 04:00 PM   Modules accepted: Orders

## 2012-11-29 NOTE — Progress Notes (Signed)
Pulse: 82

## 2012-11-29 NOTE — Progress Notes (Signed)
New OB, H/O AIDS, with low CD4 counts, low viral load  Subjective:    Kimberly Bishop is a G1P0000 [redacted]w[redacted]d being seen today for her first obstetrical visit.  Her obstetrical history is significant for HIV/AIDS. Patient does not intend to breast feed. Pregnancy history fully reviewed.  Patient reports nausea.  Filed Vitals:   11/29/12 0942  BP: 110/74  Temp: 98.5 F (36.9 C)  Weight: 105 lb (47.628 kg)    HISTORY: OB History  Gravida Para Term Preterm AB SAB TAB Ectopic Multiple Living  1 0 0 0 0 0 0 0 0 0     # Outcome Date GA Lbr Len/2nd Weight Sex Delivery Anes PTL Lv  1 CUR              Past Medical History  Diagnosis Date  . Immune deficiency disorder   . HIV (human immunodeficiency virus infection)   . 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 Appendectomy  2011  . No past surgeries     Family History  Problem Relation Age of Onset  . Adopted: Yes     Exam    Uterus:     Pelvic Exam:    Perineum: No Hemorrhoids   Vulva: normal   Vagina:  normal mucosa   pH:     Cervix: no lesions   Adnexa: normal adnexa   Bony Pelvis: average  System: Breast:  normal appearance, no masses or tenderness   Skin: normal coloration and turgor, no rashes    Neurologic: oriented, normal   Extremities: normal strength, tone, and muscle mass   HEENT oropharynx clear, no lesions   Mouth/Teeth mucous membranes moist, pharynx normal without lesions   Neck supple   Cardiovascular: regular rate and rhythm, no murmurs or gallops   Respiratory:  appears well, vitals normal, no respiratory distress, acyanotic, normal RR, neck free of mass or lymphadenopathy, chest clear, no wheezing, crepitations, rhonchi, normal symmetric air entry   Abdomen: soft, non-tender; bowel sounds normal; no masses,  no organomegaly   Urinary: urethral meatus normal      Assessment:    Pregnancy: G1P0000 Patient Active Problem List    Diagnosis Date Noted  . High-risk pregnancy in first trimester 11/29/2012  . Hyperemesis gravidarum 11/06/2012  . Dysuria 10/25/2012  . 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 Nausea with vomiting 08/30/2012  . HIV disease 07/03/2012  . Perinatal HIV exposure 12/16/2011        Plan:     Initial labs drawn. Prenatal vitamins. Problem list reviewed and updated. Genetic Screening discussed First Screen: will be scheduled if dating [place] normal.  Ultrasound discussed; fetal survey: 18-20 weeks.  Follow up in 4 weeks. 50% of 30 min visit spent on counseling and coordination of care.    ARNOLD,JAMES 11/29/2012

## 2012-11-29 NOTE — Progress Notes (Signed)
Nutrition note: 1st visit consult Pt has gained 3# @ [redacted]w[redacted]d, which is wnl.  Pt reports eating 3 meals & 4 snacks/d. Pt is taking PNV. Pt reports having N/V but no heartburn. Pt received verbal & written education on general nutrition during pregnancy. Discussed tips to decrease N/V. Discussed methods to help her milk 'dry up' due to HIV status. Discussed wt gain goals of 25-35# or 1#/wk. Pt agrees to continue taking PNV. Pt does not have WIC but plans to apply. Due to HIV status-no BF. F/u if referred Blondell Reveal, MS, RD, LDN

## 2012-11-29 NOTE — Progress Notes (Signed)
U/S scheduled 11/30/12 at 815 am.

## 2012-11-29 NOTE — Patient Instructions (Addendum)
Pregnancy - First Trimester  During sexual intercourse, millions of sperm go into the vagina. Only 1 sperm will penetrate and fertilize the female egg while it is in the Fallopian tube. One week later, the fertilized egg implants into the wall of the uterus. An embryo begins to develop into a baby. At 6 to 8 weeks, the eyes and face are formed and the heartbeat can be seen on ultrasound. At the end of 12 weeks (first trimester), all the baby's organs are formed. Now that you are pregnant, you will want to do everything you can to have a healthy baby. Two of the most important things are to get good prenatal care and follow your caregiver's instructions. Prenatal care is all the medical care you receive before the baby's birth. It is given to prevent, find, and treat problems during the pregnancy and childbirth.  PRENATAL EXAMS  · During prenatal visits, your weight, blood pressure, and urine are checked. This is done to make sure you are healthy and progressing normally during the pregnancy.  · A pregnant woman should gain 25 to 35 pounds during the pregnancy. However, if you are overweight or underweight, your caregiver will advise you regarding your weight.  · Your caregiver will ask and answer questions for you.  · Blood work, cervical cultures, other necessary tests, and a Pap test are done during your prenatal exams. These tests are done to check on your health and the probable health of your baby. Tests are strongly recommended and done for HIV with your permission. This is the virus that causes AIDS. These tests are done because medicines can be given to help prevent your baby from being born with this infection should you have been infected without knowing it. Blood work is also used to find out your blood type, previous infections, and follow your blood levels (hemoglobin).  · Low hemoglobin (anemia) is common during pregnancy. Iron and vitamins are given to help prevent this. Later in the pregnancy, blood  tests for diabetes will be done along with any other tests if any problems develop.  · You may need other tests to make sure you and the baby are doing well.  CHANGES DURING THE FIRST TRIMESTER   Your body goes through many changes during pregnancy. They vary from person to person. Talk to your caregiver about changes you notice and are concerned about. Changes can include:  · Your menstrual period stops.  · The egg and sperm carry the genes that determine what you look like. Genes from you and your partner are forming a baby. The female genes determine whether the baby is a boy or a girl.  · Your body increases in girth and you may feel bloated.  · Feeling sick to your stomach (nauseous) and throwing up (vomiting). If the vomiting is uncontrollable, call your caregiver.  · Your breasts will begin to enlarge and become tender.  · Your nipples may stick out more and become darker.  · The need to urinate more. Painful urination may mean you have a bladder infection.  · Tiring easily.  · Loss of appetite.  · Cravings for certain kinds of food.  · At first, you may gain or lose a couple of pounds.  · You may have changes in your emotions from day to day (excited to be pregnant or concerned something may go wrong with the pregnancy and baby).  · You may have more vivid and strange dreams.  HOME CARE INSTRUCTIONS   ·   It is very important to avoid all smoking, alcohol and non-prescribed drugs during your pregnancy. These affect the formation and growth of the baby. Avoid chemicals while pregnant to ensure the delivery of a healthy infant.  · Start your prenatal visits by the 12th week of pregnancy. They are usually scheduled monthly at first, then more often in the last 2 months before delivery. Keep your caregiver's appointments. Follow your caregiver's instructions regarding medicine use, blood and lab tests, exercise, and diet.  · During pregnancy, you are providing food for you and your baby. Eat regular, well-balanced  meals. Choose foods such as meat, fish, milk and other low fat dairy products, vegetables, fruits, and whole-grain breads and cereals. Your caregiver will tell you of the ideal weight gain.  · You can help morning sickness by keeping soda crackers at the bedside. Eat a couple before arising in the morning. You may want to use the crackers without salt on them.  · Eating 4 to 5 small meals rather than 3 large meals a day also may help the nausea and vomiting.  · Drinking liquids between meals instead of during meals also seems to help nausea and vomiting.  · A physical sexual relationship may be continued throughout pregnancy if there are no other problems. Problems may be early (premature) leaking of amniotic fluid from the membranes, vaginal bleeding, or belly (abdominal) pain.  · Exercise regularly if there are no restrictions. Check with your caregiver or physical therapist if you are unsure of the safety of some of your exercises. Greater weight gain will occur in the last 2 trimesters of pregnancy. Exercising will help:  · Control your weight.  · Keep you in shape.  · Prepare you for labor and delivery.  · Help you lose your pregnancy weight after you deliver your baby.  · Wear a good support or jogging bra for breast tenderness during pregnancy. This may help if worn during sleep too.  · Ask when prenatal classes are available. Begin classes when they are offered.  · Do not use hot tubs, steam rooms, or saunas.  · Wear your seat belt when driving. This protects you and your baby if you are in an accident.  · Avoid raw meat, uncooked cheese, cat litter boxes, and soil used by cats throughout the pregnancy. These carry germs that can cause birth defects in the baby.  · The first trimester is a good time to visit your dentist for your dental health. Getting your teeth cleaned is okay. Use a softer toothbrush and brush gently during pregnancy.  · Ask for help if you have financial, counseling, or nutritional needs  during pregnancy. Your caregiver will be able to offer counseling for these needs as well as refer you for other special needs.  · Do not take any medicines or herbs unless told by your caregiver.  · Inform your caregiver if there is any mental or physical domestic violence.  · Make a list of emergency phone numbers of family, friends, hospital, and police and fire departments.  · Write down your questions. Take them to your prenatal visit.  · Do not douche.  · Do not cross your legs.  · If you have to stand for long periods of time, rotate you feet or take small steps in a circle.  · You may have more vaginal secretions that may require a sanitary pad. Do not use tampons or scented sanitary pads.  MEDICINES AND DRUG USE IN PREGNANCY  ·   Take prenatal vitamins as directed. The vitamin should contain 1 milligram of folic acid. Keep all vitamins out of reach of children. Only a couple vitamins or tablets containing iron may be fatal to a baby or young child when ingested.  · Avoid use of all medicines, including herbs, over-the-counter medicines, not prescribed or suggested by your caregiver. Only take over-the-counter or prescription medicines for pain, discomfort, or fever as directed by your caregiver. Do not use aspirin, ibuprofen, or naproxen unless directed by your caregiver.  · Let your caregiver also know about herbs you may be using.  · Alcohol is related to a number of birth defects. This includes fetal alcohol syndrome. All alcohol, in any form, should be avoided completely. Smoking will cause low birth rate and premature babies.  · Street or illegal drugs are very harmful to the baby. They are absolutely forbidden. A baby born to an addicted mother will be addicted at birth. The baby will go through the same withdrawal an adult does.  · Let your caregiver know about any medicines that you have to take and for what reason you take them.  SEEK MEDICAL CARE IF:   You have any concerns or worries during your  pregnancy. It is better to call with your questions if you feel they cannot wait, rather than worry about them.  SEEK IMMEDIATE MEDICAL CARE IF:   · An unexplained oral temperature above 102° F (38.9° C) develops, or as your caregiver suggests.  · You have leaking of fluid from the vagina (birth canal). If leaking membranes are suspected, take your temperature and inform your caregiver of this when you call.  · There is vaginal spotting or bleeding. Notify your caregiver of the amount and how many pads are used.  · You develop a bad smelling vaginal discharge with a change in the color.  · You continue to feel sick to your stomach (nauseated) and have no relief from remedies suggested. You vomit blood or coffee ground-like materials.  · You lose more than 2 pounds of weight in 1 week.  · You gain more than 2 pounds of weight in 1 week and you notice swelling of your face, hands, feet, or legs.  · You gain 5 pounds or more in 1 week (even if you do not have swelling of your hands, face, legs, or feet).  · You get exposed to German measles and have never had them.  · You are exposed to fifth disease or chickenpox.  · You develop belly (abdominal) pain. Round ligament discomfort is a common non-cancerous (benign) cause of abdominal pain in pregnancy. Your caregiver still must evaluate this.  · You develop headache, fever, diarrhea, pain with urination, or shortness of breath.  · You fall or are in a car accident or have any kind of trauma.  · There is mental or physical violence in your home.  Document Released: 02/22/2001 Document Revised: 11/23/2011 Document Reviewed: 08/26/2008  ExitCare® Patient Information ©2014 [employer].

## 2012-11-30 ENCOUNTER — Ambulatory Visit (HOSPITAL_COMMUNITY)
Admission: RE | Admit: 2012-11-30 | Discharge: 2012-11-30 | Disposition: A | Discharge status: Home / Self Care | Payer: Medicaid Other | Source: Ambulatory Visit | Attending: Obstetrics & Gynecology | Admitting: Obstetrics & Gynecology

## 2012-11-30 DIAGNOSIS — O210 Mild hyperemesis gravidarum: Secondary | ICD-10-CM

## 2012-11-30 DIAGNOSIS — O021 Missed abortion: Secondary | ICD-10-CM | POA: Insufficient documentation

## 2012-11-30 DIAGNOSIS — O0991 Supervision of high risk pregnancy, unspecified, first trimester: Secondary | ICD-10-CM

## 2012-12-03 ENCOUNTER — Encounter: Payer: Self-pay | Admitting: *Deleted

## 2012-12-03 LAB — CULTURE, OB URINE

## 2012-12-03 NOTE — Progress Notes (Signed)
Late Entry - Went to Radiology on Friday morning to speak with patient and her Mother (a gentleman was also in the room).  Patient had received U/S showing no cardiac activity.  Introduced myself and explained to patient that I had scheduled her an appointment in our office on Wednesday 12/05/12 at 2:15 pm.  Explained to patient that I would have liked to have scheduled her on Monday but we didn't have any available appointments.  I gave the patient my card with my direct phone number and explained if she decided over the weekend that she would like to go ahead and intervene to call me on Monday and I would speak with the provider on call to make arrangements.  Patient and family stated understanding.  Received a phone call this morning from patient's mother.  States her daughter asked her to call.  States she is very confused and doesn't understand.  She states her daughter would like to have another ultrasound just to be certain.  I explained I would need to speak with a provider and would call her back.  She gave me the phone number [phone].  I spoke with Dr. Macon Large who will be seeing the patient on Wednesday.  Dr. Macon Large states the patient's insurance will not cover another formal scan.  Said she is willing to look using our ultrasound when the patient comes in for her appointment on Wednesday and then can have a detailed discussion with the patient to review options.  I phoned the patient's mother back at above number and gave her the information.  She states understanding and states she plans to attend appointment with her daughter on Wednesday.

## 2012-12-05 ENCOUNTER — Ambulatory Visit: Payer: Medicaid Other | Admitting: Advanced Practice Midwife

## 2012-12-05 ENCOUNTER — Ambulatory Visit (INDEPENDENT_AMBULATORY_CARE_PROVIDER_SITE_OTHER): Payer: Medicaid Other | Admitting: Obstetrics & Gynecology

## 2012-12-05 VITALS — BP 115/63 | HR 80 | Temp 98.1°F | Ht 62.0 in | Wt 108.0 lb

## 2012-12-05 DIAGNOSIS — O021 Missed abortion: Secondary | ICD-10-CM

## 2012-12-06 ENCOUNTER — Ambulatory Visit: Payer: Medicaid Other | Admitting: Internal Medicine

## 2012-12-06 ENCOUNTER — Telehealth: Payer: Self-pay | Admitting: *Deleted

## 2012-12-06 ENCOUNTER — Encounter: Payer: Self-pay | Admitting: Obstetrics & Gynecology

## 2012-12-06 ENCOUNTER — Other Ambulatory Visit: Payer: Self-pay | Admitting: *Deleted

## 2012-12-06 DIAGNOSIS — B20 Human immunodeficiency virus [HIV] disease: Secondary | ICD-10-CM

## 2012-12-06 MED ORDER — DOLUTEGRAVIR SODIUM 50 MG PO TABS: 50.0000 mg | ORAL_TABLET | Freq: Every day | ORAL | Status: DC

## 2012-12-06 NOTE — Progress Notes (Signed)
GYNECOLOGY CLINIC ENCOUNTER NOTE  History:  22 y.o. G1P0 here today for discussion of management of recently diagnosed 9 week MAB.  She also wants another ultrasound to "make sure".  She is accompanied by her mother.  Denies any bleeding, cramping, fevers or other concerns. Of note, patient has been HIV positive since birth and is followed by [facility].  The following portions of the patient's history were reviewed and updated as appropriate: allergies, current medications, past family history, past medical history, past social history, past surgical history and problem list.  Review of Systems:  Pertinent items are noted in HPI.  Objective:  Physical Exam BP 115/63  Pulse 80  Temp(Src) 98.1 F (36.7 C) (Oral)  Ht 5\' 2"  (1.575 m)  Wt 108 lb (48.988 kg)  BMI 19.75 kg/m2  LMP 10/01/2012 Gen: NAD Abd: Soft, nontender and nondistended Pelvic: Deferred  Clinic Transabdominal Ultrasound: 9 week size fetus, no cardiac activity noted  Labs and Imaging 11/30/2012 TRANSVAGINAL OBSTETRIC US  Clinical Data: Follow-up early pregnancy, no intrauterine pregnancy previously identified  Technique:  Transvaginal ultrasound was performed for complete evaluation of the gestation as well as the maternal uterus, adnexal regions, and pelvic cul-de-sac.  Comparison:  10/22/2012  Intrauterine gestational AVW:[id number], mildly irregular Yolk sac: Visualized Embryo: Visualized Cardiac Activity: Not visualized Heart Rate: Zero  CRL: 23mm           9   w  1   d          [place] EDC: 07/04/13  Maternal uterus/adnexae: Ovaries are normal.  Trace free fluid in the cul-de-sac.  IMPRESSION: Intrauterine fetal demise at 9-week-1-day.   Assessment & Plan:  Discussed management of missed abortion: expectant management vs misoprostol vs D&E.  Risks and benefits of all modalities discussed; all questions answered.  Patient opted for expectant management for now.  She was told to call if she changes  her mind, or if there is no passage of tissue in 4 weeks or develops any symptoms of infection.  Bleeding precautions reviewed; she was told to call clinic or come to [facility] for any concerns.  If patient changes her mind and opts for D&E, please call the Attending on call and this will be scheduled for her sometime in the subsequent few days depending on surgeon and OR availability.  If she changes her mind and wants misoprostol administration, she will have to come back for clinic lab draw; needs ABO/Rh and CBC (of note, Hgb on 11/26/12 was 12.6), no need to redraw unless this result is >47 days old from time of misoprostol administration).  The protocol/checklist is below:  Early Intrauterine Pregnancy Failure Treatment with Misoprostol  ___  Documented intrauterine pregnancy failure less than or equal to [redacted] weeks gestation  ___  No serious current illness  ___  Baseline Hgb greater than or equal to 10g/dl  ___  Patient has easily accessible transportation to the hospital  ___  Clear preference  ___  Practitioner/physician deems patient reliable  ___  Counseling by practitioner or physician  ___  Patient education by RN  ___  Consent form signed  ___  Rho-Gam given by RN if indicated  ___ Medication dispensed   ___   Cytotec 800 mcg  __   Intravaginally by patient at home         __   Intravaginally by RN in MAU/Clinic        __   Rectally by patient at home  __   Rectally by RN in MAU/Clinic  ___  Ibuprofen 600 mg 1 tablet by mouth every 6 hours as needed #30  ___  Hydrocodone/acetaminophen 5/325 mg by mouth every 4 to 6 hours as needed #15  ___  Phenergan 12.5 mg by mouth every 4 hours as needed for nausea #30  She will follow up in one week after misoprostol administration; if there has been no passage of pregnancy, will consider repeat misoprostol vs D&E.  Patient should again be advised to call or come in for evaluation for any concerning symptoms; bleeding  precautions should be strictly emphasized.   Jaynie Collins, MD, FACOG Attending Obstetrician & Gynecologist Faculty Practice, [facility] [place]

## 2012-12-06 NOTE — Telephone Encounter (Signed)
Sent in new rx to [facility], confirmed d/c of reyetaz and norvir.  RX ready for her to pick up.  Left message to inform pt.

## 2012-12-06 NOTE — Patient Instructions (Signed)
Return to clinic for any obstetric concerns or go to MAU for evaluation  

## 2012-12-06 NOTE — Telephone Encounter (Signed)
Pt missed today's appt d/t transportation issues.  RN discussed Dr. Ephriam Knuckles concerns re: medication adherence, viral load, CD4, and transmission to the fetus.  Pt notes that she still has severe nausea and misses approximately 3 doses of her ART per week.  She states she is taking the zofran 30 minutes before her medications, but she still cannot keep the medications down. Would like to know if there is an alterative? Pt then informed RN that she was told she is going to miscarry the pregnancy. She had 2 ultrasounds this week that showed no heartbeat.  She denies bleeding or cramping at this time.  She states that she will have to decide if she wants to miscarry naturally or have a D&C.  She is still undecided at this time.  RN rescheduled pt for labs and follow up.  She would like to know how long she should continue her new regimen, given her difficulty tolerating the reyataz, now that she is no longer pregnant with a live fetus. Andree Coss, RN

## 2012-12-06 NOTE — Telephone Encounter (Signed)
Yes, she can go back to Tivicay and Truvada, though I suspect her n/v is due to the pregnancy, not the medications.

## 2012-12-08 ENCOUNTER — Encounter: Payer: Self-pay | Admitting: *Deleted

## 2012-12-11 ENCOUNTER — Ambulatory Visit: Payer: Self-pay | Admitting: Internal Medicine

## 2012-12-12 ENCOUNTER — Encounter (HOSPITAL_COMMUNITY): Payer: Self-pay

## 2012-12-12 ENCOUNTER — Telehealth: Payer: Self-pay | Admitting: *Deleted

## 2012-12-12 ENCOUNTER — Inpatient Hospital Stay (HOSPITAL_COMMUNITY): Payer: Medicaid Other

## 2012-12-12 ENCOUNTER — Inpatient Hospital Stay (HOSPITAL_COMMUNITY)
Admission: AD | Admit: 2012-12-12 | Discharge: 2012-12-12 | Disposition: A | Discharge status: Home / Self Care | Payer: Medicaid Other | Source: Ambulatory Visit | Attending: Family Medicine | Admitting: Family Medicine

## 2012-12-12 DIAGNOSIS — R109 Unspecified abdominal pain: Secondary | ICD-10-CM | POA: Insufficient documentation

## 2012-12-12 DIAGNOSIS — O039 Complete or unspecified spontaneous abortion without complication: Secondary | ICD-10-CM | POA: Insufficient documentation

## 2012-12-12 LAB — CBC
HCT: 33 % — ABNORMAL LOW (ref 36.0–46.0)
Hemoglobin: 11.5 g/dL — ABNORMAL LOW (ref 12.0–15.0)
MCH: 30 pg (ref 26.0–34.0)
MCHC: 34.8 g/dL (ref 30.0–36.0)
Platelets: 191 10*3/uL (ref 150–400)
RBC: 3.83 MIL/uL — ABNORMAL LOW (ref 3.87–5.11)

## 2012-12-12 LAB — ABO/RH: ABO/RH(D): O POS

## 2012-12-12 LAB — HCG, QUANTITATIVE, PREGNANCY: hCG, Beta Chain, Quant, S: 10481 m[IU]/mL — ABNORMAL HIGH (ref <5)

## 2012-12-12 MED ORDER — PROMETHAZINE HCL 25 MG PO TABS: 25.0000 mg | ORAL_TABLET | Freq: Four times a day (QID) | ORAL | Status: DC | PRN

## 2012-12-12 MED ORDER — HYDROMORPHONE HCL PF 1 MG/ML IJ SOLN
1.0000 mg | Freq: Once | INTRAMUSCULAR | Status: AC
  Administered 2012-12-12: 1 mg via INTRAMUSCULAR
  Filled 2012-12-12: qty 1

## 2012-12-12 MED ORDER — OXYCODONE-ACETAMINOPHEN 5-325 MG PO TABS: 1.0000 | ORAL_TABLET | ORAL | Status: DC | PRN

## 2012-12-12 MED ORDER — MISOPROSTOL 200 MCG PO TABS
800.0000 ug | ORAL_TABLET | Freq: Once | ORAL | Status: AC
  Administered 2012-12-12: 800 ug via VAGINAL
  Filled 2012-12-12: qty 4

## 2012-12-12 MED ORDER — IBUPROFEN 800 MG PO TABS: 800.0000 mg | ORAL_TABLET | Freq: Three times a day (TID) | ORAL | Status: DC

## 2012-12-12 NOTE — MAU Provider Note (Signed)
This pt, has small amount 0.6 cm possible retained POC after having 9 wk IUP with no HR 2 wks ago.  Most of the tissue has passed and we are trying to avoid D and C for this pt.  Bleeding only started this am.  She is not having cytotec for completion of MAB.

## 2012-12-12 NOTE — MAU Note (Signed)
Patient states that she was told 2 weeks that she will have a miscarriage because the fetus did not have a heart beat. She presents today with c/o heavier vaginal bleeding (? "Passed a sac with clot" per patient) and painful cramping that started this morning.

## 2012-12-12 NOTE — MAU Provider Note (Signed)
History     CSN: [id number]  Arrival date and time: 12/12/12 4098   First Provider Initiated Contact with Patient 12/12/12 [phone]      Chief Complaint  Patient presents with  . Abdominal Cramping  . Vaginal Bleeding  . Headache   HPI [name] is a 22 y.o. G1P0000 at [redacted]w[redacted]d who presents to MAU today with vaginal bleeding and abdominal pain. The patient had MAB diagnosed at [redacted]w[redacted]d and chose expectant management. She states that she started bleeding around 0600 today heavily and passing clots and ? Tissue. She is having lower abdominal cramping rated at 9/10 now more prominent at the midline. She has not taken any pain medication. She denies N/V, fever, UTI symptoms, dizziness, weakness or fatigue. She does have a headache.   OB History   Grav Para Term Preterm Abortions TAB SAB Ect Mult Living   1 0 0 0 0 0 0 0 0 0       Past Medical History  Diagnosis Date  . Immune deficiency disorder   . HIV (human immunodeficiency virus infection)   . 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 Appendectomy  2011  . No past surgeries      Family History  Problem Relation Age of Onset  . Adopted: Yes    History  Substance Use Topics  . Smoking status: Never Smoker   . Smokeless tobacco: Never Used  . Alcohol Use: No     Comment: occasional    Allergies: No Known Allergies  Prescriptions prior to admission  Medication Sig Dispense Refill  . emtricitabine-tenofovir (TRUVADA) 200-300 MG per tablet Take 1 tablet by mouth daily.  30 tablet  5  . ondansetron (ZOFRAN) 4 MG tablet Take 1 tablet (4 mg total) by mouth every 8 (eight) hours as needed for nausea.  20 tablet  0  . dolutegravir (TIVICAY) 50 MG tablet Take 1 tablet (50 mg total) by mouth daily. With truvada; stop reyetaz and norvir  30 tablet  5    Review of Systems  Constitutional: Negative for fever and malaise/fatigue.  Gastrointestinal:  Positive for abdominal pain. Negative for nausea, vomiting and diarrhea.  Genitourinary: Negative for dysuria, urgency and frequency.       + vaginal bleeding  Neurological: Negative for dizziness, loss of consciousness and weakness.   Physical Exam   Blood pressure 124/74, pulse 90, temperature 98.1 F (36.7 C), temperature source Oral, resp. rate 16, height 5\' 2"  (1.575 m), weight 107 lb 8 oz (48.762 kg), last menstrual period 10/01/2012, SpO2 100.00%, unknown if currently breastfeeding.  Physical Exam  Constitutional: She is oriented to person, place, and time. She appears well-developed and well-nourished. No distress.  HENT:  Head: Normocephalic and atraumatic.  Cardiovascular: Normal rate, regular rhythm and normal heart sounds.   Respiratory: Effort normal and breath sounds normal. No respiratory distress.  GI: Soft. Bowel sounds are normal. She exhibits no distension and no mass. There is tenderness (mild suprapubic tenderness to palpation more prominent at the midline). There is no rebound and no guarding.  Genitourinary: Uterus is enlarged. There is bleeding (moderate amount of blood in the vaginal vault with small clots of POC) around the vagina.  Neurological: She is alert and oriented to person, place, and time.  Skin: Skin is warm and dry. No erythema.  Psychiatric: She has a normal mood and affect.   Results for orders placed during the hospital  encounter of 12/12/12 (from the past 24 hour(s))  ABO/RH     Status: None   Collection Time    12/12/12 10:15 AM      Result Value Range   ABO/RH(D) O POS    HCG, QUANTITATIVE, PREGNANCY     Status: Abnormal   Collection Time    12/12/12 10:15 AM      Result Value Range   hCG, Beta Chain, Quant, S 10481 (*) <5 mIU/mL  CBC     Status: Abnormal   Collection Time    12/12/12 10:16 AM      Result Value Range   WBC 5.6  4.0 - 10.5 K/uL   RBC 3.83 (*) 3.87 - 5.11 MIL/uL   Hemoglobin 11.5 (*) 12.0 - 15.0 g/dL   HCT 40.9 (*)  81.1 - 46.0 %   MCV 86.2  78.0 - 100.0 fL   MCH 30.0  26.0 - 34.0 pg   MCHC 34.8  30.0 - 36.0 g/dL   RDW 91.4  78.2 - 95.6 %   Platelets 191  150 - 400 K/uL   US Ob Transvaginal  12/12/2012   *RADIOLOGY REPORT*  Clinical Data: Spontaneous abortion in progress.  TRANSVAGINAL OBSTETRIC US  Technique:  Transvaginal ultrasound was performed for complete evaluation of the gestation as well as the maternal uterus, adnexal regions, and pelvic cul-de-sac.  Comparison:  Ultrasound 11/30/2012  The uterus measures 8.6 x 3.9 x 5.4 cm.  The endometrium is thickened measuring up to 1.2 cm and heterogeneous in echogenicity. Centrally within the thickened endometrium there is a 0.6 x 0.6 x 0.3 cm cystic structure.  There is blood flow demonstrated to the thickened heterogeneous endometrium.  The right and left ovaries are grossly unremarkable.  IMPRESSION: Markedly thickened and heterogeneous endometrium with small cystic spaces and internal color blood flow.  Findings are concerning for retained products of conception in the setting of spontaneous abortion.  These results will be called to the ordering clinician or representative by the Radiologist Assistant, and communication documented in the PACS Dashboard.   Original Report Authenticated By: Annia Belt, M.D    MAU Course  Procedures None  MDM CBC, ABO/Rh, quant hCG and [place] today Discussed with Dr. Shawnie Pons. Give Cytotec to ensure passage of any possible retained tissue. Patient will need to follow-up in GYN clinic 800 mcg Cytotec placed vaginally in MAU by RN       Early Intrauterine Pregnancy Failure  _x__  Documented intrauterine pregnancy failure less than or equal to [redacted] weeks gestation  _x__  No serious current illness - patient has HIV and AIDS - Discussed with Dr. Shawnie Pons. Ok for administration of Cytotec  _x__  Baseline Hgb greater than or equal to 10g/dl  _x__  Patient has easily accessible transportation to the hospital  _x__  Clear  preference  _x__  Practitioner/physician deems patient reliable  _x__  Counseling by practitioner or physician  _x__  Patient education by RN  _x__  Consent form signed  _N/A__  Rho-Gam given by RN if indicated  _x__ Medication dispensed   __x_   Cytotec 800 mcg  __   Intravaginally by patient at home         _x_   Intravaginally by RN in MAU        __   Rectally by patient at home        __   Rectally by RN in MAU  _x__  Ibuprofen 800 mg 1 tablet by mouth  every 6 hours as needed #30  _x__  Hydrocodone/acetaminophen 5/325 mg by mouth every 4 to 6 hours as needed  _x__  Phenergan 25 mg by mouth every 4 hours as needed for nausea    Assessment and Plan  A: SAB  P: Discharge home Cytotec placed in MAU Rx for Percocet, Phenergan and Ibuprofen given/sent to patient's pharmacy Bleeding precautions discussed Patient referred to [facility] clinic for follow-up in ~ 2 weeks. They will call patient with an appointment Patient may return to MAU as needed or if her condition were to change or worsen  Freddi Starr, PA-C  12/12/2012, 12:18 PM

## 2012-12-12 NOTE — Telephone Encounter (Signed)
Patient left a message on my voicemail stating she was bleeding and experiencing a lot of pain.  States she was told to call "when something happened".  I called patient back at the number she provided [phone].  Patient states early this morning she had some bleeding and lots of pain.  Explained to patient she had probably miscarried.  Patient denies passing any tissue.  Encouraged patient to come to MAU for evaluation.  Patient states understanding.

## 2012-12-13 ENCOUNTER — Telehealth: Payer: Self-pay | Admitting: *Deleted

## 2012-12-13 NOTE — Telephone Encounter (Signed)
Left message on home phone asking to see how pt was feeling and asking her to call her MD's office to get her back into care.  Pt recently had a miscarriage and has now lost her pregnancy medicaid.  Pt needs to get back on ADAP as soon as possible to prevent a lapse in medications. Andree Coss, RN

## 2012-12-26 ENCOUNTER — Encounter: Payer: Self-pay | Admitting: Obstetrics & Gynecology

## 2012-12-26 ENCOUNTER — Ambulatory Visit (INDEPENDENT_AMBULATORY_CARE_PROVIDER_SITE_OTHER): Payer: Medicaid Other | Admitting: Obstetrics & Gynecology

## 2012-12-26 VITALS — BP 114/77 | HR 76 | Temp 97.1°F | Ht 62.0 in | Wt 109.9 lb

## 2012-12-26 DIAGNOSIS — O034 Incomplete spontaneous abortion without complication: Secondary | ICD-10-CM

## 2012-12-26 DIAGNOSIS — Z3009 Encounter for other general counseling and advice on contraception: Secondary | ICD-10-CM

## 2012-12-26 DIAGNOSIS — O039 Complete or unspecified spontaneous abortion without complication: Secondary | ICD-10-CM

## 2012-12-26 DIAGNOSIS — Z30011 Encounter for initial prescription of contraceptive pills: Secondary | ICD-10-CM

## 2012-12-26 MED ORDER — NORETHIN ACE-ETH ESTRAD-FE 1-20 MG-MCG(24) PO TABS: 1.0000 | ORAL_TABLET | Freq: Every day | ORAL | Status: DC

## 2012-12-26 NOTE — Patient Instructions (Addendum)
Ask Dr. Luciana Axe about HPV vaccine. Also get flu vaccine!!  Contraception Choices Birth control (contraception) can stop pregnancy from happening. Different types of birth control work in different ways. Some can:  Make the mucus in the cervix thick. This makes it hard for sperm to get into the uterus.  Thin the lining of the uterus. This makes it hard for an egg to attach to the wall of the uterus.  Stop the ovaries from releasing an egg.  Block the sperm from reaching the egg. Certain types of surgery can stop pregnancy from happening. For women, the sugery closes the fallopian tubes (tubal ligation). For men, the surgery stops sperm from releasing during sex (vasectomy). HORMONAL BIRTH CONTROL Hormonal birth control stops pregnancy by putting hormones into your body. Types of birth control include:  A small tube put under the skin of the upper arm (implant). The tube can stay in place for 3 years.  Shots given every 3 months.  Pills taken every day or once after sex (intercourse).  Patches that are changed once a week.  A ring put into the vagina (vaginal ring). The ring is left in place for 3 weeks and removed for 1 week. Then, a new ring is put in the vagina. BARRIER BIRTH CONTROL  Barrier birth control blocks sperm from reaching the egg. Types of birth control include:   A thin covering worn on the penis (female condom) during sex.  A soft, loose covering put into the vagina (female condom) before sex.  A rubber bowl that sits over the cervix (diaphragm). The bowl must be made for you. The bowl is put into the vagina before sex. The bowl is left in place for 6 to 8 hours after sex.  A small, soft cup that fits over the cervix (cervical cap). The cup must be made for you. The cup can be left in place for 48 hours after sex.  A sponge that is put into the vagina before sex.  A chemical that kills or blocks sperm from getting into the cervix and uterus (spermicide). The chemical  may be a cream, jelly, foam, or pill. INTRAUTERINE (IUD) BIRTH CONTROL  IUD birth control is a small, T-shaped piece of plastic. The plastic is put inside the uterus. There are 2 types of IUD:  Copper IUD. The IUD is covered in copper wire. The copper makes a fluid that kills sperm. It can stay in place for 10 years.  Hormone IUD. The hormone stops pregnancy from happening. It can stay in place for 5 years. NATURAL FAMILY PLANNING BIRTH CONTROL  Natural family planning means not having sex or using barrier birth control when the woman is fertile. A woman can:  Use a calendar to keep track of when she is fertile.  Use a thermometer to measure her body temperature. Protect yourself against sexual diseases no matter what type of birth control you use. Talk to your doctor about which type of birth control is best for you. Document Released: 12/26/2008 Document Revised: 05/23/2011 Document Reviewed: 07/07/2010 [facility] Patient Information 2014 [place], [place].    Human Papillomavirus (HPV) Gardasil Vaccine What You Need to Know WHAT IS HPV?  Genital human papillomavirus (HPV) is the most common sexually transmitted virus in the [place]. More than half of sexually active men and women are infected with HPV at some time in their lives.  About 20 million Americans are currently infected, and about 6 million more get infected each year. HPV is usually  spread through sexual contact.  Most HPV infections do not cause any symptoms and go away on their own. But HPV can cause cervical cancer in women. Cervical cancer is the 2nd leading cause of cancer deaths among women around the world. In the [place], about 12,000 women get cervical cancer every year and about 4,000 are expected to die from it.  HPV is also associated with several less common cancers, such as vaginal and vulvar cancers in women, and anal and oropharyngeal (back of the throat, including base of tongue and tonsils)  cancers in both men and women. HPV can also cause genital warts and warts in the throat.  There is no cure for HPV infection, but some of the problems it causes can be treated. HPV VACCINE: WHY GET VACCINATED?  The HPV vaccine you are getting is 1 of 2 vaccines that can be given to prevent HPV. It may be given to both males and females.  This vaccine can prevent most cases of cervical cancer in females, if it is given before exposure to the virus. In addition, it can prevent vaginal and vulvar cancer in females, and genital warts and anal cancer in both males and females.  Protection from HPV vaccine is expected to be long-lasting. But vaccination is not a substitute for cervical cancer screening. Women should still get regular Pap tests. WHO SHOULD GET THIS HPV VACCINE AND WHEN? HPV vaccine is given as a 3-dose series.  1st Dose: Now.  2nd Dose: 1 to 2 months after Dose 1.  3rd Dose: 6 months after Dose 1. Additional (booster) doses are not recommended. Routine Vaccination This HPV vaccine is recommended for girls and boys 59 or 22 years of age. It may be given starting at age 4. Why is HPV vaccine recommended at 77 or 22 years of age?  HPV infection is easily acquired, even with only one sex partner. That is why it is important to get HPV vaccine before any sexual conact takes place. Also, response to the vaccine is better at this age than at older ages. Catch-Up Vaccination This vaccine is recommended for the following people who have not completed the 3-dose series:   Females 13 through 22 years of age.  Males 13 through 22 years of age. This vaccine may be given to men 22 through 22 years of age who have not completed the 3-dose series. It is recommended for men through age 30 who have sex with men or whose immune system is weakened because of HIV infection, other illness, or medications.  HPV vaccine may be given at the same time as other vaccines. SOME PEOPLE SHOULD NOT GET HPV  VACCINE OR SHOULD WAIT  Anyone who has ever had a life-threatening allergic reaction to any other component of HPV vaccine, or to a previous dose of HPV vaccine, should not get the vaccine. Tell your doctor if the person getting vaccinated has any severe allergies, including an allergy to yeast.  HPV vaccine is not recommended for pregnant women. However, receiving HPV vaccine when pregnant is not a reason to consider terminating the pregnancy. Women who are breastfeeding may get the vaccine.  People who are mildly ill when a dose of HPV is planned can still be vaccinated. People with a moderate or severe illness should wait until they are better. WHAT ARE THE RISKS FROM THIS VACCINE?  This HPV vaccine has been used in the [place] and around the world for about 6 years and has been very  safe.  However, any medicine could possibly cause a serious problem, such as a severe allergic reaction. The risk of any vaccine causing a serious injury, or death, is extremely small.  Life-threatening allergic reactions from vaccines are very rare. If they do occur, it would be within a few minutes to a few hours after the vaccination. Several mild to moderate problems are known to occur with HPV vaccine. These do not last long and go away on their own.  Reactions in the arm where the shot was given:  Pain (about 8 people in 10).  Redness or swelling (about 1 person in 4).  Fever:  Mild (100 F or 37.8 C) (about 1 person in 10).  Moderate (102 F or 38.9 C) (about 1 person in 81).  Other problems:  Headache (about 1 person in 3).  Fainting: Brief fainting spells and related symptoms (such as jerking movements) can happen after any medical procedure, including vaccination. Sitting or lying down for about 15 minutes after a vaccination can help prevent fainting and injuries caused by falls. Tell your doctor if the patient feels dizzy or lightheaded, or has vision changes or ringing in the ears.  Like  all vaccines, HPV vaccines will continue to be monitored for unusual or severe problems. WHAT IF THERE IS A SERIOUS REACTION? What should I look for?  Any unusual condition, such as a high fever or unusual behavior. Signs of a serious allergic reaction can include difficulty breathing, hoarseness or wheezing, hives, paleness, weakness, a fast heartbeat, or dizziness. What should I do?  Call a doctor, or get the person to a doctor right away.  Tell your doctor what happened, the date and time it happened, and when the vaccination was given.  Ask your doctor, nurse, or health department to report the reaction by filing a Vaccine Adverse Event Reporting System (VAERS) form. Or, you can file this report through the VAERS website at www.vaers.LAgents.no or by calling 1-619-441-0929. VAERS does not provide medical advice. THE NATIONAL VACCINE INJURY COMPENSATION PROGRAM  The National Vaccine Injury Compensation Program (VICP) is a federal program that was created to compensate people who may have been injured by certain vaccines.  Persons who believe they may have been injured by a vaccine can learn about the program and about filing a claim by calling 1-(925)232-6518 or visiting the VICP website at SpiritualWord.at HOW CAN I LEARN MORE?  Ask your doctor.  Call your local or state health department.  Contact the Centers for Disease Control and Prevention (CDC):  Call 870 548 7021 (1-800-CDC-INFO)  or  Visit CDC's website at PicCapture.uy CDC Human Papillomavirus (HPV) Gardasil (Interim) 07/29/11 Document Released: 12/26/2005 Document Revised: 11/23/2011 Document Reviewed: 05/05/2010 [facility] Patient Information 2014 [place], [place].

## 2012-12-26 NOTE — Progress Notes (Signed)
GYNECOLOGY CLINIC ENCOUNTER NOTE  History:  22 y.o. G1P0010 here today for follow up after SAB; was seen for heavy bleeding on 12/12/12 and misoprostol was placed because OB TVUS showed 0.6 cm retained POC (see report below). Patient reports having small amount of bleeding, she is worried she did not pass the RPOC.  No current bleeding. Not sexually active.  The following portions of the patient's history were reviewed and updated as appropriate: allergies, current medications, past family history, past medical history, past social history, past surgical history and problem list. Has not received HPV or flu vaccines, will ask her PCP.  Review of Systems:  Pertinent items are noted in HPI.  Objective:  BP 114/77  Pulse 76  Temp(Src) 97.1 F (36.2 C) (Oral)  Ht 5\' 2"  (1.575 m)  Wt 109 lb 14.4 oz (49.85 kg)  BMI 20.1 kg/m2  LMP 10/01/2012 Physical Exam deferred  Labs and Imaging 12/12/2012  TRANSVAGINAL OBSTETRIC US  Clinical Data: Spontaneous abortion in progress.    Technique:  Transvaginal ultrasound was performed for complete evaluation of the gestation as well as the maternal uterus, adnexal regions, and pelvic cul-de-sac.  Comparison:  Ultrasound 11/30/2012  The uterus measures 8.6 x 3.9 x 5.4 cm.  The endometrium is thickened measuring up to 1.2 cm and heterogeneous in echogenicity. Centrally within the thickened endometrium there is a 0.6 x 0.6 x 0.3 cm cystic structure.  There is blood flow demonstrated to the thickened heterogeneous endometrium.  The right and left ovaries are grossly unremarkable.  IMPRESSION: Markedly thickened and heterogeneous endometrium with small cystic spaces and internal color blood flow.  Findings are concerning for retained products of conception in the setting of spontaneous abortion.  These results will be called to the ordering clinician or representative by the Radiologist Assistant, and communication documented in the PACS Dashboard.   Original Report  Authenticated By: Annia Belt, M.D   11/30/2012   TRANSVAGINAL OBSTETRIC US Clinical Data: Follow-up early pregnancy, no intrauterine pregnancy previously identified  Technique:  Transvaginal ultrasound was performed for complete evaluation of the gestation as well as the maternal uterus, adnexal regions, and pelvic cul-de-sac.  Comparison:  10/22/2012  Intrauterine gestational [id number], mildly irregular Yolk sac: Visualized Embryo: Visualized Cardiac Activity: Not visualized Heart Rate: Zero  CRL: 23mm           9   w  1   d          [place] EDC: 07/04/13  Maternal uterus/adnexae: Ovaries are normal.  Trace free fluid in the cul-de-sac.  IMPRESSION: Intrauterine fetal demise at 9-week-1-day.  This will be called to the teaching service as a call report by sonographer Amber Ward.   Original Report Authenticated By: Christiana Pellant, M.D.    Assessment & Plan:  Patient was told that 0.6 cm RPOC after SAB is a normal finding, no further intervention was needed and surgery was definitely not indicated. She still wants to obtain another ultrasound for her peace of mind, this was ordered, but I told her that if the POC was still present, no intervention will be necessary in the absence of symptoms.   Contraceptive counseling done with patient, options reviewed. Patient desires OCPs, this was prescribed and risks reviewed. No personal or FH of any hypercoagulable conditions.  Also emphasized using condoms during every sexual encounter to prevent STIs.  Counseled patient about HPV vaccine, flu vaccine and routine preventative health maintenance measures emphasized. She will follow up with Dr. Luciana Axe or PCP about  these vaccines.  Return in 3 months for OCP and BP check.  Jaynie Collins, MD, FACOG Attending Obstetrician & Gynecologist Faculty Practice, [facility]

## 2012-12-27 ENCOUNTER — Encounter: Payer: Medicaid Other | Admitting: Family Medicine

## 2012-12-28 ENCOUNTER — Telehealth: Payer: Self-pay | Admitting: *Deleted

## 2012-12-28 ENCOUNTER — Ambulatory Visit (HOSPITAL_COMMUNITY)
Admission: RE | Admit: 2012-12-28 | Discharge: 2012-12-28 | Disposition: A | Discharge status: Home / Self Care | Payer: Medicaid Other | Source: Ambulatory Visit | Attending: Obstetrics & Gynecology | Admitting: Obstetrics & Gynecology

## 2012-12-28 ENCOUNTER — Other Ambulatory Visit: Payer: Self-pay | Admitting: Obstetrics & Gynecology

## 2012-12-28 DIAGNOSIS — O034 Incomplete spontaneous abortion without complication: Secondary | ICD-10-CM

## 2012-12-28 DIAGNOSIS — Z309 Encounter for contraceptive management, unspecified: Secondary | ICD-10-CM

## 2012-12-28 DIAGNOSIS — O039 Complete or unspecified spontaneous abortion without complication: Secondary | ICD-10-CM | POA: Insufficient documentation

## 2012-12-28 NOTE — Telephone Encounter (Signed)
Pt left message stating that Medicaid does not cover the medication (OCP) prescribed. I called pt and informed her that I will send a message to Dr. Macon Large and ask her to prescribe an alternate OCP.  She will receive a call back with a response.  Pt voiced understanding.

## 2012-12-31 MED ORDER — NORETHINDRONE ACET-ETHINYL EST 1-20 MG-MCG PO TABS: 1.0000 | ORAL_TABLET | Freq: Every day | ORAL | Status: DC

## 2012-12-31 NOTE — Telephone Encounter (Signed)
Regular generic Loestrin ordered, should be covered by Medicaid.

## 2013-01-03 ENCOUNTER — Encounter: Payer: Self-pay | Admitting: Licensed Clinical Social Worker

## 2013-01-08 NOTE — Telephone Encounter (Signed)
Called and spoke w/pharmacist @ [facility]. Pt has not picked up the new Rx for OCP. She stated that the pt had ADAP assistance to cover cost of medications, however it expired last month. Before they can submit claim to Medicaid they need to know if pt is going to re-instate the ADAP assistance. If she is, then that would be primary source for pharmacy coverage and she will need to wait to obtain OCP's. If she is not going to re-instate ADAP, they will submit to Medicaid which will cover cost of OCP. I called pt and explained this to her and she stated that she has ID clinic appt on Thursday. I asked if she can begin the application process for re-instatement. She stated that she will talk to "Britta Mccreedy" about it. Pt is aware that alternate Rx was sent to her pharmacy and can be picked up once she has finalized her coverage for prescriptions.

## 2013-01-10 ENCOUNTER — Other Ambulatory Visit: Payer: Medicaid Other

## 2013-01-16 ENCOUNTER — Telehealth: Payer: Self-pay | Admitting: *Deleted

## 2013-01-16 NOTE — Telephone Encounter (Signed)
Message copied by Gerome Apley on Wed Jan 16, 2013  1:20 PM ------      Message from: Odelia Gage A      Created: Tue Jan 15, 2013  1:01 PM                   ----- Message -----         From: Tereso Newcomer, MD         Sent: 12/31/2012  11:53 AM           To: [facility]            <1 cm area suspicious for mild retained products of conception.  No need for further intervention for now unless she has heavy bleeding or other concerning symptoms. Please call to inform patient of results and recommendations.       ------

## 2013-01-16 NOTE — Telephone Encounter (Signed)
Called [facility] home number and heard message number was disconnected. Called mobile number and heard message person is unavailable- unable to leave a message.

## 2013-01-17 ENCOUNTER — Encounter: Payer: Self-pay | Admitting: *Deleted

## 2013-01-17 ENCOUNTER — Telehealth: Payer: Self-pay | Admitting: *Deleted

## 2013-01-17 ENCOUNTER — Ambulatory Visit: Payer: Medicaid Other | Admitting: Internal Medicine

## 2013-01-17 NOTE — Telephone Encounter (Signed)
Called [facility] numbers again and unable to leave messages . Will send letter.

## 2013-01-17 NOTE — Telephone Encounter (Signed)
Called patient to reschedule her appt, she no showed today and no working phone numbers. Kimberly Bishop

## 2013-01-25 ENCOUNTER — Encounter: Payer: Self-pay | Admitting: *Deleted

## 2013-02-13 ENCOUNTER — Inpatient Hospital Stay (HOSPITAL_COMMUNITY)
Admission: AD | Admit: 2013-02-13 | Discharge: 2013-02-13 | Disposition: A | Discharge status: Home / Self Care | Payer: Medicaid Other | Source: Ambulatory Visit | Attending: Obstetrics & Gynecology | Admitting: Obstetrics & Gynecology

## 2013-02-13 ENCOUNTER — Other Ambulatory Visit: Payer: Self-pay

## 2013-02-13 DIAGNOSIS — N938 Other specified abnormal uterine and vaginal bleeding: Secondary | ICD-10-CM | POA: Insufficient documentation

## 2013-02-13 DIAGNOSIS — Z3202 Encounter for pregnancy test, result negative: Secondary | ICD-10-CM | POA: Insufficient documentation

## 2013-02-13 DIAGNOSIS — N898 Other specified noninflammatory disorders of vagina: Secondary | ICD-10-CM

## 2013-02-13 DIAGNOSIS — R109 Unspecified abdominal pain: Secondary | ICD-10-CM | POA: Insufficient documentation

## 2013-02-13 DIAGNOSIS — N949 Unspecified condition associated with female genital organs and menstrual cycle: Secondary | ICD-10-CM | POA: Insufficient documentation

## 2013-02-13 DIAGNOSIS — N939 Abnormal uterine and vaginal bleeding, unspecified: Secondary | ICD-10-CM

## 2013-02-13 LAB — URINALYSIS, ROUTINE W REFLEX MICROSCOPIC
Glucose, UA: NEGATIVE mg/dL
Ketones, ur: NEGATIVE mg/dL
Nitrite: POSITIVE — AB
Protein, ur: NEGATIVE mg/dL
pH: 6 (ref 5.0–8.0)

## 2013-02-13 LAB — POCT PREGNANCY, URINE: Preg Test, Ur: NEGATIVE

## 2013-02-13 LAB — URINE MICROSCOPIC-ADD ON

## 2013-02-13 MED ORDER — NORGESTIMATE-ETH ESTRADIOL 0.25-35 MG-MCG PO TABS: 1.0000 | ORAL_TABLET | Freq: Every day | ORAL | Status: DC

## 2013-02-13 NOTE — MAU Note (Signed)
Patient is in with c/o intermittent abdominal pain, headache, nausea and vaginal bleeding. She states that her lmp 11/3, however she had spotting 11/15 and she thinks that she is pregnant.

## 2013-02-13 NOTE — Progress Notes (Unsigned)
Pt came down to the front office and informed us that she just got discharged and someone, "Tresa Endo" informed her to come straight down to the clinics for BCP's.  Called Dr. Penne Lash and provider did not know anything about BCP's nor pt.  Was instructed to call Dr. Ike Bene.  Called Dr. Ike Bene and he was not aware of pts needs.  Dr. Ike Bene asked the NP in MAU and she stated that the pt had asked for a cheaper pill cause the LoEstrin Fe was to expensive.  I asked when was her last  period.  Pt stated that she was currently on her period that it started today, 02/13/13.  I verified pt's pharmacy and stated that it would be

## 2013-02-13 NOTE — MAU Provider Note (Signed)
History of Present Illness   Patient Identification Kimberly Bishop is a 22 y.o. female.  Patient information was obtained from patient. History/Exam limitations: none. Patient presented to the MAU by private vehicle.  Chief Complaint  Abdominal Pain, Vaginal Bleeding, Nausea and Headache  [name] is a 53 yof G1P0010 who presents today with a complaint of vaginal bleeding.  Pt sates her LMP was 01/14/13 and she has been sexually active since then.  She did not start her OCPs given by [facility] due to not being able to afford her prescribed OCP.  She has not used condoms.   States she has noticed spotting, abd pain and nausea since 11/15.  Today she requests a pregnancy test due to being unsure if she is experiencing pregnancy s/s or having her menses.   Pt denies fever, chills, vaginal discharge  Recent SAB 12/12/12.  Past Medical History  Diagnosis Date  . Immune deficiency disorder   . HIV (human immunodeficiency virus infection)   . 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 Adopted: Yes    No Known Allergies History   Social History  . Marital Status: Single    Spouse Name: N/A    Number of Children: N/A  . Years of Education: N/A   Occupational History  . Not on file.   Social History Main Topics  . Smoking status: Never Smoker   . Smokeless tobacco: Never Used  . Alcohol Use: No     Comment: occasional  . Drug Use: No  . Sexual Activity: Yes    Partners: Male    Birth Control/ Protection: Condom     Comment: pt declined condoms   Other Topics Concern  . Not on file   Social History Narrative  . No narrative on file   Review of Systems Pertinent items are noted in HPI.   Physical Exam   BP 123/80  Pulse 79  Temp(Src) 98.7 F (37.1 C) (Oral)  Resp 16  Ht 5\' 2"  (1.575 m)  Wt 104 lb (47.174 kg)  BMI 19.02 kg/m2  SpO2 100%  LMP 01/14/2013 General:   alert, cooperative and  no distress  Heart: regular rate and rhythm, S1, S2 normal, no murmur, click, rub or gallop  Lungs: clear to auscultation bilaterally  Abdomen: Soft with mild tenderness in hypogastric region.  No masses or organomegaly noted.           Patient was offered and declined pelvic exam to evaluate vaginal bleeding stating that now that her UPT was negative she felt sure this bleeding was her expected menstrual cycle.    Assessment/Plan   1. Vaginal bleeding - After urine pregnancy test returned negative, pt states that she believes this to be her regular menses, and refused pelvic exam.   - Pt advised to f/u with clinic for more affordable option for OCPs and to use condoms during intercourse should she not want to get pregnant at this time.    I have reviewed the student's note, went in and talked to patient myself, offering exam.  She declined.  I agree with the student's history and ROS.

## 2013-02-14 ENCOUNTER — Encounter: Payer: Self-pay | Admitting: *Deleted

## 2013-02-15 LAB — URINE CULTURE

## 2013-02-19 ENCOUNTER — Telehealth: Payer: Self-pay | Admitting: Nurse Practitioner

## 2013-02-19 DIAGNOSIS — N390 Urinary tract infection, site not specified: Secondary | ICD-10-CM

## 2013-02-19 MED ORDER — CIPROFLOXACIN HCL 500 MG PO TABS: 500.0000 mg | ORAL_TABLET | Freq: Two times a day (BID) | ORAL | Status: AC

## 2013-02-19 NOTE — Telephone Encounter (Signed)
Pt was seen on February 13, 2013 and had a positive UA. She was not treated. Urine culture was positive for E-Coli. Will send RX for Cipro 500 mg BID X 5 days to her pharmacy. She was called and advised to pick up and take medication.

## 2013-02-20 NOTE — MAU Provider Note (Signed)
Attestation of Attending Supervision of Advanced Practitioner (CNM/NP): Evaluation and management procedures were performed by the Advanced Practitioner under my supervision and collaboration. I have reviewed the Advanced Practitioner's note and chart, and I agree with the management and plan.  Reyan Helle H. 9:36 AM   

## 2013-02-27 ENCOUNTER — Other Ambulatory Visit (INDEPENDENT_AMBULATORY_CARE_PROVIDER_SITE_OTHER): Payer: Self-pay

## 2013-02-27 ENCOUNTER — Other Ambulatory Visit: Payer: Self-pay | Admitting: *Deleted

## 2013-02-27 ENCOUNTER — Other Ambulatory Visit: Payer: Self-pay | Admitting: Internal Medicine

## 2013-02-27 DIAGNOSIS — N926 Irregular menstruation, unspecified: Secondary | ICD-10-CM

## 2013-02-27 DIAGNOSIS — B20 Human immunodeficiency virus [HIV] disease: Secondary | ICD-10-CM

## 2013-02-27 LAB — COMPLETE METABOLIC PANEL WITH GFR
ALT: 15 U/L (ref 0–35)
Albumin: 4.8 g/dL (ref 3.5–5.2)
CO2: 25 mEq/L (ref 19–32)
Calcium: 10 mg/dL (ref 8.4–10.5)
Chloride: 104 mEq/L (ref 96–112)
GFR, Est African American: >89 mL/min
GFR, Est Non African American: >89 mL/min
Glucose, Bld: 81 mg/dL (ref 70–99)
Potassium: 4.5 mEq/L (ref 3.5–5.3)
Sodium: 139 mEq/L (ref 135–145)
Total Protein: 8.5 g/dL — ABNORMAL HIGH (ref 6.0–8.3)

## 2013-02-27 LAB — CBC WITH DIFFERENTIAL/PLATELET
Basophils Absolute: 0 10*3/uL (ref 0.0–0.1)
Basophils Relative: 1 % (ref 0–1)
Eosinophils Absolute: 0.1 10*3/uL (ref 0.0–0.7)
Hemoglobin: 12.1 g/dL (ref 12.0–15.0)
MCH: 28.2 pg (ref 26.0–34.0)
MCHC: 33.3 g/dL (ref 30.0–36.0)
Monocytes Absolute: 0.4 10*3/uL (ref 0.1–1.0)
Monocytes Relative: 9 % (ref 3–12)
Neutro Abs: 3.2 10*3/uL (ref 1.7–7.7)
Neutrophils Relative %: 73 % (ref 43–77)
Platelets: 133 10*3/uL — ABNORMAL LOW (ref 150–400)
RDW: 14.7 % (ref 11.5–15.5)
WBC: 4.3 10*3/uL (ref 4.0–10.5)

## 2013-02-27 MED ORDER — EMTRICITABINE-TENOFOVIR DF 200-300 MG PO TABS: 1.0000 | ORAL_TABLET | Freq: Every day | ORAL | Status: DC

## 2013-02-27 MED ORDER — DOLUTEGRAVIR SODIUM 50 MG PO TABS: 50.0000 mg | ORAL_TABLET | Freq: Every day | ORAL | Status: DC

## 2013-02-27 NOTE — Addendum Note (Signed)
Addended by: Lurlean Leyden on: 02/27/2013 04:57 PM   Modules accepted: Orders

## 2013-02-28 LAB — HCG, SERUM, QUALITATIVE: Preg, Serum: NEGATIVE

## 2013-02-28 LAB — HIV-1 RNA ULTRAQUANT REFLEX TO GENTYP+
HIV 1 RNA Quant: 560 copies/mL — ABNORMAL HIGH (ref <20)
HIV-1 RNA Quant, Log: 2.75 {Log} — ABNORMAL HIGH (ref <1.30)

## 2013-03-22 ENCOUNTER — Other Ambulatory Visit: Payer: Self-pay | Admitting: *Deleted

## 2013-03-22 DIAGNOSIS — B20 Human immunodeficiency virus [HIV] disease: Secondary | ICD-10-CM

## 2013-03-22 MED ORDER — DOLUTEGRAVIR SODIUM 50 MG PO TABS: 50.0000 mg | ORAL_TABLET | Freq: Every day | ORAL | Status: DC

## 2013-03-22 MED ORDER — EMTRICITABINE-TENOFOVIR DF 200-300 MG PO TABS: 1.0000 | ORAL_TABLET | Freq: Every day | ORAL | Status: DC

## 2013-03-28 ENCOUNTER — Encounter: Payer: Medicaid Other | Admitting: Obstetrics & Gynecology

## 2013-03-28 ENCOUNTER — Encounter: Payer: Self-pay | Admitting: Internal Medicine

## 2013-03-28 ENCOUNTER — Ambulatory Visit (INDEPENDENT_AMBULATORY_CARE_PROVIDER_SITE_OTHER): Payer: Medicaid Other | Admitting: Internal Medicine

## 2013-03-28 VITALS — BP 120/78 | HR 75 | Temp 97.9°F | Ht 62.0 in | Wt 104.8 lb

## 2013-03-28 DIAGNOSIS — Z23 Encounter for immunization: Secondary | ICD-10-CM

## 2013-03-28 DIAGNOSIS — R110 Nausea: Secondary | ICD-10-CM | POA: Insufficient documentation

## 2013-03-28 DIAGNOSIS — M549 Dorsalgia, unspecified: Secondary | ICD-10-CM

## 2013-03-28 DIAGNOSIS — B20 Human immunodeficiency virus [HIV] disease: Secondary | ICD-10-CM

## 2013-03-28 LAB — CBC
HEMATOCRIT: 36.3 % (ref 36.0–46.0)
Hemoglobin: 12 g/dL (ref 12.0–15.0)
MCH: 27.7 pg (ref 26.0–34.0)
MCHC: 33.1 g/dL (ref 30.0–36.0)
MCV: 83.8 fL (ref 78.0–100.0)
Platelets: 119 10*3/uL — ABNORMAL LOW (ref 150–400)
RBC: 4.33 MIL/uL (ref 3.87–5.11)
RDW: 15 % (ref 11.5–15.5)
WBC: 3.7 10*3/uL — ABNORMAL LOW (ref 4.0–10.5)

## 2013-03-28 LAB — COMPREHENSIVE METABOLIC PANEL
ALT: 12 U/L (ref 0–35)
AST: 18 U/L (ref 0–37)
Albumin: 4.5 g/dL (ref 3.5–5.2)
Alkaline Phosphatase: 77 U/L (ref 39–117)
BUN: 10 mg/dL (ref 6–23)
CALCIUM: 9.6 mg/dL (ref 8.4–10.5)
CHLORIDE: 105 meq/L (ref 96–112)
CO2: 24 meq/L (ref 19–32)
CREATININE: 0.69 mg/dL (ref 0.50–1.10)
Glucose, Bld: 94 mg/dL (ref 70–99)
Potassium: 4.1 mEq/L (ref 3.5–5.3)
SODIUM: 140 meq/L (ref 135–145)
TOTAL PROTEIN: 8 g/dL (ref 6.0–8.3)
Total Bilirubin: 0.5 mg/dL (ref 0.3–1.2)

## 2013-03-28 LAB — LIPID PANEL
Cholesterol: 156 mg/dL (ref 0–200)
HDL: 46 mg/dL
LDL Cholesterol: 97 mg/dL (ref 0–99)
Total CHOL/HDL Ratio: 3.4 ratio
Triglycerides: 67 mg/dL
VLDL: 13 mg/dL (ref 0–40)

## 2013-03-28 LAB — RPR

## 2013-03-28 MED ORDER — ONDANSETRON HCL 4 MG PO TABS: 4.0000 mg | ORAL_TABLET | Freq: Three times a day (TID) | ORAL | Status: DC | PRN

## 2013-03-28 NOTE — Progress Notes (Signed)
Patient ID: Kimberly Bishop, female   DOB: 1991-02-21, 23 y.o.   MRN: [id number]          [facility] for Infectious Disease  Patient Active Problem List   Diagnosis Date Noted  . HIV disease 07/03/2012    Priority: High  . Back pain 03/28/2013  . Chronic nausea 03/28/2013  . 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 Perinatal HIV exposure 12/16/2011    Patient's Medications  New Prescriptions   ONDANSETRON (ZOFRAN) 4 MG TABLET    Take 1 tablet (4 mg total) by mouth every 8 (eight) hours as needed for nausea or vomiting.  Previous Medications   DOLUTEGRAVIR (TIVICAY) 50 MG TABLET    Take 1 tablet (50 mg total) by mouth daily.   EMTRICITABINE-TENOFOVIR (TRUVADA) 200-300 MG PER TABLET    Take 1 tablet by mouth daily.   NORGESTIMATE-ETHINYL ESTRADIOL (ORTHO-CYCLEN,SPRINTEC,PREVIFEM) 0.25-35 MG-MCG TABLET    Take 1 tablet by mouth daily.  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 She states that she developed low back pain about one week ago. It is worse when she is laying down. She does not recall any injury. She says that she's had this problem in the past but cannot tell me how often or if she knew what caused it previously. She has not tried taking anything for her pain. She was reluctant to take any Tylenol because of her problems with chronic nausea. She denies any fever, dysuria or hematuria.  She also missed her most recent menstrual period. She estimates that she is about 2 weeks late. She took 2 urine pregnancy tests at home, the last about one week ago when both were negative. She has been having unprotected intercourse with her boyfriend. She was prescribed oral contraceptives last October after a spontaneous abortion. However she states that she only got the medication late last month and only took one dose. She says she simply forgets.  She says  that she's been off of her HIV medications. Telephone note from September 25 of states that she was supposed to resume taking Truvada today. She states that her bridge counselor, told her to stop her medicines several months ago when she called to report that she was still having problems with nausea when she took her medications. She is currently not on any medications.  Review of Systems: Pertinent items are noted in HPI.  Past Medical History  Diagnosis Date  . Immune deficiency disorder   . HIV (human immunodeficiency virus infection)   . 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 Smoking status: Never Smoker   . Smokeless tobacco: Never Used  . Alcohol Use: No     Comment: occasional    Family History  Problem Relation Age of Onset  . Adopted: Yes    No Known Allergies  Objective: Temp: 97.9 F (36.6 C) (01/15 1334) Temp src: Oral (01/15 1334) BP: 120/78 mmHg (01/15 1334) Pulse Rate: 75 (01/15 1334)  General: She is quiet, well dressed and in no distress Oral: No oropharyngeal lesions Skin: No rash Lungs: Clear Cor: Regular S1-S2 with no murmurs Abdomen: Soft and nontender without any palpable masses. No CVA tenderness. Joints and extremities: Normal Neuro: Alert and oriented with normal speech and conversation Mild discomfort with palpation over her lumbar spine without any other signs  of inflammation  Lab Results Lab Results  Component Value Date   WBC 4.3 02/27/2013   HGB 12.1 02/27/2013   HCT 36.3 02/27/2013   MCV 84.6 02/27/2013   PLT 133* 02/27/2013    Lab Results  Component Value Date   CREATININE 0.85 02/27/2013   BUN 10 02/27/2013   NA 139 02/27/2013   K 4.5 02/27/2013   CL 104 02/27/2013   CO2 25 02/27/2013    Lab Results  Component Value Date   ALT 15 02/27/2013   AST 18 02/27/2013   ALKPHOS 79 02/27/2013   BILITOT 0.8 02/27/2013    Lab Results  Component Value Date   CHOL 173  12/09/2011   HDL 49 12/09/2011   LDLCALC 116* 12/09/2011   TRIG 38 12/09/2011   CHOLHDL 3.5 12/09/2011    Lab Results HIV 1 RNA Quant (copies/mL)  Date Value  02/27/2013 560*  11/26/2012 129*  10/11/2012 <20      CD4 T Cell Abs (/uL)  Date Value  02/27/2013 90*  11/26/2012 70*  10/11/2012 90*     Assessment: Most importantly we will need to recheck her pregnancy test today.  Suspect she may have recurrent musculoskeletal low back pain but I will also check to see if she has a urinary tract infection.  Sounds like she has problems with chronic nausea affecting parents with medications as well as some memory deficits that make it difficult route number take medications consistently. I will give her some Zofran and allow her to take acetaminophen while we wait on the results of her other tests. I will not restart antiretroviral therapy until her know the results of her pregnancy test.  Plan: 1. Pregnancy test 2. UA and urine culture 3. CBC, complete metabolic panel, CD4 count, viral load, RPR and lipid panel 4. Zofran 5. Followup within the next several days   Michel Bickers, MD [facility] [phone] pager   [phone] cell 03/28/2013, 1:56 PM

## 2013-03-29 LAB — URINALYSIS, ROUTINE W REFLEX MICROSCOPIC
Bilirubin Urine: NEGATIVE
GLUCOSE, UA: NEGATIVE mg/dL
Hgb urine dipstick: NEGATIVE
Ketones, ur: NEGATIVE mg/dL
Nitrite: NEGATIVE
Protein, ur: NEGATIVE mg/dL
Specific Gravity, Urine: 1.018 (ref 1.005–1.030)
Urobilinogen, UA: 0.2 mg/dL (ref 0.0–1.0)
pH: 7.5 (ref 5.0–8.0)

## 2013-03-29 LAB — URINALYSIS, MICROSCOPIC ONLY
Casts: NONE SEEN
Crystals: NONE SEEN
Squamous Epithelial / LPF: NONE SEEN
WBC, UA: >50 WBC/hpf — AB (ref <3)

## 2013-03-29 LAB — POCT URINE PREGNANCY: PREG TEST UR: NEGATIVE

## 2013-03-30 LAB — URINE CULTURE: Colony Count: >=100000

## 2013-03-31 LAB — HIV-1 RNA QUANT-NO REFLEX-BLD
HIV 1 RNA Quant: 4764 copies/mL — ABNORMAL HIGH (ref <20)
HIV-1 RNA Quant, Log: 3.68 {Log} — ABNORMAL HIGH (ref <1.30)

## 2013-04-01 LAB — T-HELPER CELL (CD4) - (RCID CLINIC ONLY)
CD4 % Helper T Cell: 15 % — ABNORMAL LOW (ref 33–55)
CD4 T CELL ABS: 60 /uL — AB (ref 400–2700)

## 2013-04-04 ENCOUNTER — Ambulatory Visit (INDEPENDENT_AMBULATORY_CARE_PROVIDER_SITE_OTHER): Payer: Medicaid Other | Admitting: Internal Medicine

## 2013-04-04 VITALS — Wt 104.0 lb

## 2013-04-04 DIAGNOSIS — M549 Dorsalgia, unspecified: Secondary | ICD-10-CM

## 2013-04-04 DIAGNOSIS — B20 Human immunodeficiency virus [HIV] disease: Secondary | ICD-10-CM

## 2013-04-04 MED ORDER — SULFAMETHOXAZOLE-TMP DS 800-160 MG PO TABS: 1.0000 | ORAL_TABLET | Freq: Two times a day (BID) | ORAL | Status: DC

## 2013-04-04 NOTE — Progress Notes (Signed)
   Subjective:    Patient ID: Kimberly Bishop, female    DOB: 08/01/1990, 23 y.o.   MRN: [id number]  HPI Here for follow up.  Has not been on her ARVs.  Seen last week with back pain and concern with being pregnant again but pregnancy was negative.  Did have WBCs is urine and grew Klebsiella.  No frequency, no discharge.  Takes birth control pills now and no missed doses.     Review of Systems  Constitutional: Negative for fever.  Gastrointestinal: Negative for nausea and diarrhea.  Genitourinary: Negative for dysuria, frequency and hematuria.  Skin: Negative for rash.  Psychiatric/Behavioral: Negative for dysphoric mood.       Objective:   Physical Exam  Constitutional: She appears well-developed and well-nourished. No distress.  HENT:  Mouth/Throat: No oropharyngeal exudate.  Eyes: No scleral icterus.  Cardiovascular: Normal rate, regular rhythm and normal heart sounds.   No murmur heard. Pulmonary/Chest: Effort normal and breath sounds normal. No respiratory distress. She has no wheezes.  Lymphadenopathy:    She has no cervical adenopathy.  Skin: No rash noted.          Assessment & Plan:

## 2013-04-04 NOTE — Assessment & Plan Note (Addendum)
Will restart tivicay and Truvada.  Discussed regimen and reproduction.  On effective birth control.  Understands it will need to be changed if she again gets pregnant.   Bactrim prophylaxis

## 2013-04-04 NOTE — Assessment & Plan Note (Signed)
Bacteruria, I will try a treatment course of Bactrim and see if it helps.  She does though tell me the pain has been ongoing for years.

## 2013-04-09 ENCOUNTER — Ambulatory Visit: Payer: Medicaid Other | Admitting: Internal Medicine

## 2013-04-30 ENCOUNTER — Other Ambulatory Visit: Payer: Medicaid Other

## 2013-05-07 ENCOUNTER — Ambulatory Visit: Payer: Medicaid Other | Admitting: Internal Medicine

## 2013-05-07 ENCOUNTER — Telehealth: Payer: Self-pay | Admitting: *Deleted

## 2013-05-07 NOTE — Telephone Encounter (Signed)
Unable to contact pt. 

## 2013-05-16 ENCOUNTER — Ambulatory Visit: Payer: Medicaid Other

## 2013-05-16 ENCOUNTER — Other Ambulatory Visit (HOSPITAL_COMMUNITY)
Admission: RE | Admit: 2013-05-16 | Discharge: 2013-05-16 | Disposition: A | Discharge status: Home / Self Care | Payer: Medicaid Other | Source: Ambulatory Visit | Attending: Internal Medicine | Admitting: Internal Medicine

## 2013-05-16 ENCOUNTER — Encounter: Payer: Self-pay | Admitting: *Deleted

## 2013-05-16 ENCOUNTER — Other Ambulatory Visit: Payer: Medicaid Other

## 2013-05-16 DIAGNOSIS — Z113 Encounter for screening for infections with a predominantly sexual mode of transmission: Secondary | ICD-10-CM | POA: Insufficient documentation

## 2013-05-16 DIAGNOSIS — B20 Human immunodeficiency virus [HIV] disease: Secondary | ICD-10-CM

## 2013-05-16 LAB — CBC WITH DIFFERENTIAL/PLATELET
BASOS ABS: 0 10*3/uL (ref 0.0–0.1)
Basophils Relative: 0 % (ref 0–1)
EOS PCT: 3 % (ref 0–5)
Eosinophils Absolute: 0.1 10*3/uL (ref 0.0–0.7)
HCT: 35.5 % — ABNORMAL LOW (ref 36.0–46.0)
Hemoglobin: 11.7 g/dL — ABNORMAL LOW (ref 12.0–15.0)
Lymphocytes Relative: 13 % (ref 12–46)
Lymphs Abs: 0.4 10*3/uL — ABNORMAL LOW (ref 0.7–4.0)
MCH: 27.6 pg (ref 26.0–34.0)
MCHC: 33 g/dL (ref 30.0–36.0)
MCV: 83.7 fL (ref 78.0–100.0)
Monocytes Absolute: 0.3 10*3/uL (ref 0.1–1.0)
Monocytes Relative: 9 % (ref 3–12)
Neutro Abs: 2.2 10*3/uL (ref 1.7–7.7)
Neutrophils Relative %: 75 % (ref 43–77)
Platelets: 99 10*3/uL — ABNORMAL LOW (ref 150–400)
RBC: 4.24 MIL/uL (ref 3.87–5.11)
RDW: 14.5 % (ref 11.5–15.5)
WBC: 2.9 10*3/uL — ABNORMAL LOW (ref 4.0–10.5)

## 2013-05-16 LAB — COMPLETE METABOLIC PANEL WITH GFR
ALT: 26 U/L (ref 0–35)
AST: 34 U/L (ref 0–37)
Albumin: 4.5 g/dL (ref 3.5–5.2)
Alkaline Phosphatase: 78 U/L (ref 39–117)
BUN: 11 mg/dL (ref 6–23)
CALCIUM: 9.2 mg/dL (ref 8.4–10.5)
CO2: 24 mEq/L (ref 19–32)
CREATININE: 0.75 mg/dL (ref 0.50–1.10)
Chloride: 108 mEq/L (ref 96–112)
GFR, Est African American: >89 mL/min
GFR, Est Non African American: >89 mL/min
Glucose, Bld: 86 mg/dL (ref 70–99)
Potassium: 4.1 mEq/L (ref 3.5–5.3)
Sodium: 140 mEq/L (ref 135–145)
Total Bilirubin: 0.6 mg/dL (ref 0.2–1.2)
Total Protein: 8 g/dL (ref 6.0–8.3)

## 2013-05-17 LAB — HIV-1 RNA QUANT-NO REFLEX-BLD
HIV 1 RNA Quant: 4613 copies/mL — ABNORMAL HIGH (ref <20)
HIV-1 RNA Quant, Log: 3.66 {Log} — ABNORMAL HIGH (ref <1.30)

## 2013-05-17 LAB — T-HELPER CELL (CD4) - (RCID CLINIC ONLY)
CD4 % Helper T Cell: 15 % — ABNORMAL LOW (ref 33–55)
CD4 T Cell Abs: 60 /uL — ABNORMAL LOW (ref 400–2700)

## 2013-05-17 LAB — URINE CYTOLOGY ANCILLARY ONLY
CHLAMYDIA, DNA PROBE: NEGATIVE
NEISSERIA GONORRHEA: NEGATIVE

## 2013-05-22 ENCOUNTER — Other Ambulatory Visit: Payer: Self-pay | Admitting: *Deleted

## 2013-05-22 DIAGNOSIS — B20 Human immunodeficiency virus [HIV] disease: Secondary | ICD-10-CM

## 2013-05-22 MED ORDER — DOLUTEGRAVIR SODIUM 50 MG PO TABS: 50.0000 mg | ORAL_TABLET | Freq: Every day | ORAL | Status: DC

## 2013-05-22 MED ORDER — EMTRICITABINE-TENOFOVIR DF 200-300 MG PO TABS: 1.0000 | ORAL_TABLET | Freq: Every day | ORAL | Status: DC

## 2013-05-29 ENCOUNTER — Ambulatory Visit: Payer: Medicaid Other | Admitting: Infectious Disease

## 2013-05-29 ENCOUNTER — Telehealth: Payer: Self-pay | Admitting: *Deleted

## 2013-05-29 NOTE — Telephone Encounter (Signed)
Unable to leave message informing patient of missed visit - her voice mail has not yet been set up.  Pt has multiple no-shows, is in [place] per chart review.  Will refer if not. Landis Gandy, RN

## 2013-12-23 ENCOUNTER — Inpatient Hospital Stay (HOSPITAL_COMMUNITY)
Admission: AD | Admit: 2013-12-23 | Discharge: 2013-12-23 | Disposition: A | Discharge status: Home / Self Care | Payer: Self-pay | Source: Ambulatory Visit | Attending: Family Medicine | Admitting: Family Medicine

## 2013-12-23 ENCOUNTER — Encounter (HOSPITAL_COMMUNITY): Payer: Self-pay | Admitting: *Deleted

## 2013-12-23 DIAGNOSIS — Z3202 Encounter for pregnancy test, result negative: Secondary | ICD-10-CM | POA: Insufficient documentation

## 2013-12-23 DIAGNOSIS — R319 Hematuria, unspecified: Secondary | ICD-10-CM | POA: Insufficient documentation

## 2013-12-23 DIAGNOSIS — N3001 Acute cystitis with hematuria: Secondary | ICD-10-CM

## 2013-12-23 DIAGNOSIS — Z21 Asymptomatic human immunodeficiency virus [HIV] infection status: Secondary | ICD-10-CM | POA: Insufficient documentation

## 2013-12-23 LAB — URINALYSIS, ROUTINE W REFLEX MICROSCOPIC
GLUCOSE, UA: NEGATIVE mg/dL
Ketones, ur: 15 mg/dL — AB
Nitrite: POSITIVE — AB
Protein, ur: >300 mg/dL — AB
Specific Gravity, Urine: 1.02 (ref 1.005–1.030)
UROBILINOGEN UA: 4 mg/dL — AB (ref 0.0–1.0)
pH: 7 (ref 5.0–8.0)

## 2013-12-23 LAB — URINE MICROSCOPIC-ADD ON

## 2013-12-23 LAB — CBC
HEMATOCRIT: 34.4 % — AB (ref 36.0–46.0)
HEMOGLOBIN: 11.9 g/dL — AB (ref 12.0–15.0)
MCH: 28.7 pg (ref 26.0–34.0)
MCHC: 34.6 g/dL (ref 30.0–36.0)
MCV: 83.1 fL (ref 78.0–100.0)
Platelets: 127 10*3/uL — ABNORMAL LOW (ref 150–400)
RBC: 4.14 MIL/uL (ref 3.87–5.11)
RDW: 13.2 % (ref 11.5–15.5)
WBC: 6.9 10*3/uL (ref 4.0–10.5)

## 2013-12-23 LAB — WET PREP, GENITAL
Trich, Wet Prep: NONE SEEN
Yeast Wet Prep HPF POC: NONE SEEN

## 2013-12-23 LAB — POCT PREGNANCY, URINE: PREG TEST UR: NEGATIVE

## 2013-12-23 MED ORDER — PHENAZOPYRIDINE HCL 200 MG PO TABS: 200.0000 mg | ORAL_TABLET | Freq: Three times a day (TID) | ORAL | Status: DC | PRN

## 2013-12-23 MED ORDER — CIPROFLOXACIN HCL 500 MG PO TABS: 500.0000 mg | ORAL_TABLET | Freq: Two times a day (BID) | ORAL | Status: DC

## 2013-12-23 NOTE — MAU Provider Note (Signed)
Chief Complaint: Hematuria   First Provider Initiated Contact with Patient 12/23/13 1436     SUBJECTIVE HPI: Kimberly Bishop is a 23 y.o. G1P0010 who presents to maternity admissions reporting seeing blood in the toilet when urinating since yesterday and some low abdominal pain described as mild and constant.  She thinks she may have a UTI and wants to get it checked out.  She denies vaginal bleeding, vaginal itching/burning, urinary symptoms, h/a, dizziness, n/v, or fever/chills.     Past Medical History  Diagnosis Date  . Immune deficiency disorder   . HIV (human immunodeficiency virus infection)   . 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 Appendectomy  2011  . No past surgeries     History   Social History  . Marital Status: Single    Spouse Name: N/A    Number of Children: N/A  . Years of Education: N/A   Occupational History  . Not on file.   Social History Main Topics  . Smoking status: Never Smoker   . Smokeless tobacco: Never Used  . Alcohol Use: No     Comment: occasional  . Drug Use: No  . Sexual Activity: Yes    Partners: Male    Birth Control/ Protection: Condom     Comment: pt declined condoms   Other Topics Concern  . Not on file   Social History Narrative  . No narrative on file   No current facility-administered medications on file prior to encounter.   Current Outpatient Prescriptions on File Prior to Encounter  Medication Sig Dispense Refill  . dolutegravir (TIVICAY) 50 MG tablet Take 1 tablet (50 mg total) by mouth daily.  30 tablet  6  . emtricitabine-tenofovir (TRUVADA) 200-300 MG per tablet Take 1 tablet by mouth daily.  30 tablet  6  . norgestimate-ethinyl estradiol (ORTHO-CYCLEN,SPRINTEC,PREVIFEM) 0.25-35 MG-MCG tablet Take 1 tablet by mouth daily.  1 Package  11  . ondansetron (ZOFRAN) 4 MG tablet Take 1 tablet (4 mg total) by mouth every 8 (eight) hours as needed for  nausea or vomiting.  30 tablet  1  . sulfamethoxazole-trimethoprim (BACTRIM DS) 800-160 MG per tablet Take 1 tablet by mouth 2 (two) times daily.  6 tablet  0   No Known Allergies  ROS: Pertinent items in HPI  OBJECTIVE Blood pressure 136/91, pulse 104, temperature 98.2 F (36.8 C), resp. rate 18, last menstrual period 12/05/2013. GENERAL: Well-developed, well-nourished female in no acute distress.  HEENT: Normocephalic HEART: normal rate RESP: normal effort ABDOMEN: Soft, non-tender EXTREMITIES: Nontender, no edema NEURO: Alert and oriented Pelvic exam: Cervix pink, visually closed, without lesion, large amount watery yellow discharge, vaginal walls and external genitalia normal Bimanual exam: Cervix 0/long/high, firm, anterior, neg CMT, uterus nontender, nonenlarged, adnexa without tenderness, enlargement, or mass  LAB RESULTS Results for orders placed during the hospital encounter of 12/23/13 (from the past 24 hour(s))  POCT PREGNANCY, URINE     Status: None   Collection Time    12/23/13  2:32 PM      Result Value Ref Range   Preg Test, Ur NEGATIVE  NEGATIVE   U/A pending Report to Hansel Feinstein, CNM    Medication List    ASK your doctor about these medications       dolutegravir 50 MG tablet  Commonly known as:  TIVICAY  Take 1 tablet (50 mg total) by mouth daily.     emtricitabine-tenofovir 200-300  MG per tablet  Commonly known as:  TRUVADA  Take 1 tablet by mouth daily.     norgestimate-ethinyl estradiol 0.25-35 MG-MCG tablet  Commonly known as:  ORTHO-CYCLEN,SPRINTEC,PREVIFEM  Take 1 tablet by mouth daily.     ondansetron 4 MG tablet  Commonly known as:  ZOFRAN  Take 1 tablet (4 mg total) by mouth every 8 (eight) hours as needed for nausea or vomiting.     sulfamethoxazole-trimethoprim 800-160 MG per tablet  Commonly known as:  BACTRIM DS  Take 1 tablet by mouth 2 (two) times daily.         Fatima Blank Certified Nurse-Midwife 12/23/2013   2:42 PM    Results for orders placed during the hospital encounter of 12/23/13 (from the past 72 hour(s))  URINALYSIS, ROUTINE W REFLEX MICROSCOPIC     Status: Abnormal   Collection Time    12/23/13  2:20 PM      Result Value Ref Range   Color, Urine AMBER (*) YELLOW   Comment: BIOCHEMICALS MAY BE AFFECTED BY COLOR   APPearance TURBID (*) CLEAR   Specific Gravity, Urine 1.020  1.005 - 1.030   pH 7.0  5.0 - 8.0   Glucose, UA NEGATIVE  NEGATIVE mg/dL   Hgb urine dipstick LARGE (*) NEGATIVE   Bilirubin Urine MODERATE (*) NEGATIVE   Ketones, ur 15 (*) NEGATIVE mg/dL   Protein, ur >300 (*) NEGATIVE mg/dL   Urobilinogen, UA 4.0 (*) 0.0 - 1.0 mg/dL   Nitrite POSITIVE (*) NEGATIVE   Leukocytes, UA MODERATE (*) NEGATIVE  URINE MICROSCOPIC-ADD ON     Status: Abnormal   Collection Time    12/23/13  2:20 PM      Result Value Ref Range   Squamous Epithelial / LPF FEW (*) RARE   WBC, UA 7-10  <3 WBC/hpf   RBC / HPF TOO NUMEROUS TO COUNT  <3 RBC/hpf   Bacteria, UA FEW (*) RARE   Urine-Other MICROSCOPIC EXAM PERFORMED ON UNCONCENTRATED URINE    POCT PREGNANCY, URINE     Status: None   Collection Time    12/23/13  2:32 PM      Result Value Ref Range   Preg Test, Ur NEGATIVE  NEGATIVE   Comment:            THE SENSITIVITY OF THIS     METHODOLOGY IS >24 mIU/mL  CBC     Status: Abnormal   Collection Time    12/23/13  2:50 PM      Result Value Ref Range   WBC 6.9  4.0 - 10.5 K/uL   RBC 4.14  3.87 - 5.11 MIL/uL   Hemoglobin 11.9 (*) 12.0 - 15.0 g/dL   HCT 34.4 (*) 36.0 - 46.0 %   MCV 83.1  78.0 - 100.0 fL   MCH 28.7  26.0 - 34.0 pg   MCHC 34.6  30.0 - 36.0 g/dL   RDW 13.2  11.5 - 15.5 %   Platelets 127 (*) 150 - 400 K/uL  WET PREP, GENITAL     Status: Abnormal   Collection Time    12/23/13  3:05 PM      Result Value Ref Range   Yeast Wet Prep HPF POC NONE SEEN  NONE SEEN   Trich, Wet Prep NONE SEEN  NONE SEEN   Clue Cells Wet Prep HPF POC FEW (*) NONE SEEN   WBC, Wet Prep HPF  POC MODERATE (*) NONE SEEN   Comment: MANY BACTERIA SEEN  Rxed Cipro x 7 days and Pyridium for bladder pain. Urine to culture Pyelo precautions reviewed Advised to push fluids Seabron Spates, CNM

## 2013-12-23 NOTE — MAU Note (Signed)
Pt presents to MAU with complaints of blood in her urine

## 2013-12-23 NOTE — MAU Provider Note (Signed)
Attestation of Attending Supervision of Advanced Practitioner (PA/CNM/NP): Evaluation and management procedures were performed by the Advanced Practitioner under my supervision and collaboration.  I have reviewed the Advanced Practitioner's note and chart, and I agree with the management and plan.  Donnamae Jude, MD [facility] [place] Attending 12/23/2013 7:49 PM

## 2013-12-23 NOTE — Discharge Instructions (Signed)

## 2013-12-24 LAB — GC/CHLAMYDIA PROBE AMP
CT Probe RNA: NEGATIVE
GC PROBE AMP APTIMA: NEGATIVE

## 2013-12-24 LAB — HIV 1/2 CONFIRMATION
HIV 1 ANTIBODY: POSITIVE — AB
HIV-2 Ab: NEGATIVE

## 2013-12-24 LAB — HIV ANTIBODY (ROUTINE TESTING W REFLEX): HIV 1&2 Ab, 4th Generation: REACTIVE — AB

## 2014-01-13 ENCOUNTER — Encounter (HOSPITAL_COMMUNITY): Payer: Self-pay | Admitting: *Deleted

## 2014-04-13 ENCOUNTER — Inpatient Hospital Stay (HOSPITAL_COMMUNITY)
Admission: AD | Admit: 2014-04-13 | Discharge: 2014-04-13 | Disposition: A | Discharge status: Home / Self Care | Payer: Managed Care, Other (non HMO) | Source: Ambulatory Visit | Attending: Obstetrics & Gynecology | Admitting: Obstetrics & Gynecology

## 2014-04-13 ENCOUNTER — Encounter (HOSPITAL_COMMUNITY): Payer: Self-pay | Admitting: *Deleted

## 2014-04-13 DIAGNOSIS — N390 Urinary tract infection, site not specified: Secondary | ICD-10-CM | POA: Diagnosis not present

## 2014-04-13 DIAGNOSIS — Z21 Asymptomatic human immunodeficiency virus [HIV] infection status: Secondary | ICD-10-CM | POA: Diagnosis not present

## 2014-04-13 DIAGNOSIS — R109 Unspecified abdominal pain: Secondary | ICD-10-CM | POA: Diagnosis present

## 2014-04-13 DIAGNOSIS — N938 Other specified abnormal uterine and vaginal bleeding: Secondary | ICD-10-CM | POA: Insufficient documentation

## 2014-04-13 LAB — URINE MICROSCOPIC-ADD ON

## 2014-04-13 LAB — URINALYSIS, ROUTINE W REFLEX MICROSCOPIC
BILIRUBIN URINE: NEGATIVE
Glucose, UA: NEGATIVE mg/dL
Ketones, ur: NEGATIVE mg/dL
NITRITE: POSITIVE — AB
Protein, ur: NEGATIVE mg/dL
Specific Gravity, Urine: 1.015 (ref 1.005–1.030)
Urobilinogen, UA: 0.2 mg/dL (ref 0.0–1.0)
pH: 7.5 (ref 5.0–8.0)

## 2014-04-13 LAB — POCT PREGNANCY, URINE: Preg Test, Ur: NEGATIVE

## 2014-04-13 MED ORDER — CEPHALEXIN 500 MG PO CAPS: 500.0000 mg | ORAL_CAPSULE | Freq: Four times a day (QID) | ORAL | Status: DC

## 2014-04-13 NOTE — MAU Note (Signed)
LMP 02/02/14; states no birth control and normally has regular cycle. Some light spotting on 04/07/14. Negative pregnancy test last week. Lower abdominal cramping today. Denies vaginal discharge.

## 2014-04-13 NOTE — MAU Provider Note (Signed)
History     CSN: [id number]  Arrival date and time: 04/13/14 8756   First Provider Initiated Contact with Patient 04/13/14 2037      Chief Complaint  Patient presents with  . Possible Pregnancy  . Abdominal Cramping   HPI Pt is a 24 yo G1P0010 here with LMP 02/02/14.  Reports no birth control and normally has regular cycle. Some light spotting on 04/07/14. Negative pregnancy test last week. Lower abdominal cramping today. Denies vaginal abnormal vaginal discharge.  Declines a pelvic exam.  Here to find out if pregnant.    Past Medical History  Diagnosis Date  . Immune deficiency disorder   . HIV (human immunodeficiency virus infection)   . 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 Appendectomy  2011  . No past surgeries      Family History  Problem Relation Age of Onset  . Adopted: Yes    History  Substance Use Topics  . Smoking status: Never Smoker   . Smokeless tobacco: Never Used  . Alcohol Use: No     Comment: occasional    Allergies: No Known Allergies  Prescriptions prior to admission  Medication Sig Dispense Refill Last Dose  . dolutegravir (TIVICAY) 50 MG tablet Take 1 tablet (50 mg total) by mouth daily. 30 tablet 6 Past Week at Unknown time  . emtricitabine-tenofovir (TRUVADA) 200-300 MG per tablet Take 1 tablet by mouth daily. 30 tablet 6 Past Week at Unknown time  . ciprofloxacin (CIPRO) 500 MG tablet Take 1 tablet (500 mg total) by mouth 2 (two) times daily. (Patient not taking: Reported on 04/13/2014) 14 tablet 0   . norgestimate-ethinyl estradiol (ORTHO-CYCLEN,SPRINTEC,PREVIFEM) 0.25-35 MG-MCG tablet Take 1 tablet by mouth daily. (Patient not taking: Reported on 04/13/2014) 1 Package 11 12/22/2013 at Unknown time  . ondansetron (ZOFRAN) 4 MG tablet Take 1 tablet (4 mg total) by mouth every 8 (eight) hours as needed for nausea or vomiting. (Patient not taking: Reported on 04/13/2014) 30  tablet 1 Past Week at Unknown time  . phenazopyridine (PYRIDIUM) 200 MG tablet Take 1 tablet (200 mg total) by mouth 3 (three) times daily as needed for pain (urethral spasm). (Patient not taking: Reported on 04/13/2014) 10 tablet 1     Review of Systems  Constitutional: Negative for fever and chills.  Gastrointestinal: Positive for abdominal pain (cramping). Negative for nausea and vomiting.  Genitourinary: Negative for dysuria, urgency and frequency.  All other systems reviewed and are negative.  Physical Exam   Blood pressure 142/80, pulse 84, temperature 98.5 F (36.9 C), temperature source Oral, resp. rate 16, height 5\' 2"  (1.575 m), weight 46.358 kg (102 lb 3.2 oz), last menstrual period 02/02/2014, SpO2 100 %.  Physical Exam  Constitutional: She is oriented to person, place, and time. She appears well-developed and well-nourished. No distress.  HENT:  Head: Normocephalic.  Neck: Normal range of motion. Neck supple.  Cardiovascular: Normal rate, regular rhythm and normal heart sounds.   Respiratory: Effort normal and breath sounds normal. No respiratory distress.  GI: Soft. There is no tenderness.  Musculoskeletal: Normal range of motion. She exhibits no edema.  Neurological: She is alert and oriented to person, place, and time. She has normal reflexes.  Skin: Skin is warm and dry.    MAU Course  Procedures  Results for orders placed or performed during the hospital encounter of 04/13/14 (from the past 24 hour(s))  Urinalysis, Routine w reflex  microscopic     Status: Abnormal   Collection Time: 04/13/14  7:56 PM  Result Value Ref Range   Color, Urine YELLOW YELLOW   APPearance CLOUDY (A) CLEAR   Specific Gravity, Urine 1.015 1.005 - 1.030   pH 7.5 5.0 - 8.0   Glucose, UA NEGATIVE NEGATIVE mg/dL   Hgb urine dipstick TRACE (A) NEGATIVE   Bilirubin Urine NEGATIVE NEGATIVE   Ketones, ur NEGATIVE NEGATIVE mg/dL   Protein, ur NEGATIVE NEGATIVE mg/dL   Urobilinogen, UA 0.2  0.0 - 1.0 mg/dL   Nitrite POSITIVE (A) NEGATIVE   Leukocytes, UA LARGE (A) NEGATIVE  Urine microscopic-add on     Status: Abnormal   Collection Time: 04/13/14  7:56 PM  Result Value Ref Range   Squamous Epithelial / LPF RARE RARE   WBC, UA 21-50 <3 WBC/hpf   RBC / HPF 0-2 <3 RBC/hpf   Bacteria, UA MANY (A) RARE  Pregnancy, urine POC     Status: None   Collection Time: 04/13/14  8:02 PM  Result Value Ref Range   Preg Test, Ur NEGATIVE NEGATIVE     Assessment and Plan  Dysfunctional Uterine Bleeding Urinary Tract Infections  Plan: Discharge to home Keflex 500 mg QID x 7 days Urine culture to lab. Follow-up with gyn provider if no cycle in 2-3 weeks.  Kathrine Haddock N 04/13/2014, 8:44 PM

## 2014-04-17 ENCOUNTER — Other Ambulatory Visit: Payer: Self-pay | Admitting: Advanced Practice Midwife

## 2014-04-17 DIAGNOSIS — N390 Urinary tract infection, site not specified: Secondary | ICD-10-CM

## 2014-04-17 LAB — URINE CULTURE: Colony Count: >=100000

## 2014-04-17 MED ORDER — SULFAMETHOXAZOLE-TRIMETHOPRIM 800-160 MG PO TABS: 1.0000 | ORAL_TABLET | Freq: Two times a day (BID) | ORAL | Status: DC

## 2014-04-17 NOTE — Progress Notes (Signed)
UTI Resistant to Keflex. Change to Bactrim. Please notify pt.

## 2014-04-22 NOTE — Progress Notes (Signed)
Called [facility] and left a message we are calling with some information , please call clinic and leave [place] a message of best number/time to call you and if it is ok to leave detailed information on your voicemail.

## 2014-04-23 NOTE — Progress Notes (Signed)
Called pt and left message to call back regarding important medication information.  Please state whether a detailed message can be left on your voice mail.

## 2014-04-24 ENCOUNTER — Encounter: Payer: Self-pay | Admitting: General Practice

## 2014-04-24 NOTE — Progress Notes (Signed)
Called patient, no answer- left message stating we are trying to reach you with some information please call us back at the clinics and let us know if we can leave detailed information on your voicemail or not. Will send letter

## 2014-05-19 ENCOUNTER — Other Ambulatory Visit: Payer: Self-pay | Admitting: Internal Medicine

## 2014-05-19 ENCOUNTER — Telehealth: Payer: Self-pay | Admitting: *Deleted

## 2014-05-19 ENCOUNTER — Other Ambulatory Visit (INDEPENDENT_AMBULATORY_CARE_PROVIDER_SITE_OTHER): Payer: Managed Care, Other (non HMO)

## 2014-05-19 DIAGNOSIS — B20 Human immunodeficiency virus [HIV] disease: Secondary | ICD-10-CM

## 2014-05-19 LAB — CBC WITH DIFFERENTIAL/PLATELET
BASOS ABS: 0 10*3/uL (ref 0.0–0.1)
Basophils Relative: 0 % (ref 0–1)
Eosinophils Absolute: 0.1 10*3/uL (ref 0.0–0.7)
Eosinophils Relative: 4 % (ref 0–5)
HCT: 33.9 % — ABNORMAL LOW (ref 36.0–46.0)
Hemoglobin: 10.9 g/dL — ABNORMAL LOW (ref 12.0–15.0)
LYMPHS PCT: 13 % (ref 12–46)
Lymphs Abs: 0.3 10*3/uL — ABNORMAL LOW (ref 0.7–4.0)
MCH: 27.2 pg (ref 26.0–34.0)
MCHC: 32.2 g/dL (ref 30.0–36.0)
MCV: 84.5 fL (ref 78.0–100.0)
MONO ABS: 0.2 10*3/uL (ref 0.1–1.0)
MPV: 10.7 fL (ref 8.6–12.4)
Monocytes Relative: 11 % (ref 3–12)
Neutro Abs: 1.6 10*3/uL — ABNORMAL LOW (ref 1.7–7.7)
Neutrophils Relative %: 72 % (ref 43–77)
PLATELETS: 228 10*3/uL (ref 150–400)
RBC: 4.01 MIL/uL (ref 3.87–5.11)
RDW: 14.8 % (ref 11.5–15.5)
WBC: 2.2 10*3/uL — ABNORMAL LOW (ref 4.0–10.5)

## 2014-05-19 LAB — COMPREHENSIVE METABOLIC PANEL
ALBUMIN: 4.3 g/dL (ref 3.5–5.2)
ALT: 10 U/L (ref 0–35)
AST: 16 U/L (ref 0–37)
Alkaline Phosphatase: 96 U/L (ref 39–117)
BUN: 12 mg/dL (ref 6–23)
CALCIUM: 9.1 mg/dL (ref 8.4–10.5)
CHLORIDE: 106 meq/L (ref 96–112)
CO2: 22 mEq/L (ref 19–32)
Creat: 0.81 mg/dL (ref 0.50–1.10)
Glucose, Bld: 83 mg/dL (ref 70–99)
POTASSIUM: 4 meq/L (ref 3.5–5.3)
Sodium: 137 mEq/L (ref 135–145)
Total Bilirubin: 0.6 mg/dL (ref 0.2–1.2)
Total Protein: 8 g/dL (ref 6.0–8.3)

## 2014-05-19 NOTE — Telephone Encounter (Signed)
She can take Benadryl for it.

## 2014-05-19 NOTE — Telephone Encounter (Signed)
Patient in today to do labs and complained of a rash on her face and back. She has a dark scaley raised itchy rash on her forehead and to the right of her nose and on hte small of her back that has been there for 2 months. The patient reports that she does not take the 042 medications and she does have access to insurance. Advised the patient the rash is probably due to the lack of medication but that I will send her doctor a message to see if there is anything she could use until she comes for her visit on 05/26/14 and call her back once I get an answer. Verified the patient contact information.

## 2014-05-20 LAB — URINE CYTOLOGY ANCILLARY ONLY
Chlamydia: NEGATIVE
Neisseria Gonorrhea: NEGATIVE

## 2014-05-20 LAB — T-HELPER CELL (CD4) - (RCID CLINIC ONLY)
CD4 T CELL ABS: 20 /uL — AB (ref 400–2700)
CD4 T CELL HELPER: 6 % — AB (ref 33–55)

## 2014-05-20 LAB — HIV-1 RNA QUANT-NO REFLEX-BLD
HIV 1 RNA Quant: 9864 copies/mL — ABNORMAL HIGH (ref <20)
HIV-1 RNA Quant, Log: 3.99 {Log} — ABNORMAL HIGH (ref <1.30)

## 2014-05-20 LAB — RPR

## 2014-05-20 NOTE — Telephone Encounter (Signed)
Called the patient and informed of what Dr Linus Salmons advised her to do.

## 2014-05-26 ENCOUNTER — Ambulatory Visit (INDEPENDENT_AMBULATORY_CARE_PROVIDER_SITE_OTHER): Payer: Managed Care, Other (non HMO) | Admitting: Internal Medicine

## 2014-05-26 ENCOUNTER — Encounter: Payer: Self-pay | Admitting: Internal Medicine

## 2014-05-26 VITALS — BP 129/81 | HR 86 | Temp 97.9°F | Ht 62.0 in | Wt 100.0 lb

## 2014-05-26 DIAGNOSIS — B20 Human immunodeficiency virus [HIV] disease: Secondary | ICD-10-CM

## 2014-05-26 DIAGNOSIS — R110 Nausea: Secondary | ICD-10-CM

## 2014-05-26 MED ORDER — RILPIVIRINE HCL 25 MG PO TABS: 25.0000 mg | ORAL_TABLET | Freq: Every day | ORAL | Status: DC

## 2014-05-26 MED ORDER — DOLUTEGRAVIR SODIUM 50 MG PO TABS: 50.0000 mg | ORAL_TABLET | Freq: Every day | ORAL | Status: DC

## 2014-05-26 NOTE — Assessment & Plan Note (Signed)
We can use Zofran if it becomes an issue again.

## 2014-05-26 NOTE — Assessment & Plan Note (Signed)
We discussed the different options with the patient and with pharmacy. We will try Tivicay with rilpivirine and have her get labs in about 3 weeks and I will see her after that. We'll also consider in the future using Taf containing Truvada once that comes out since it will be much smaller.

## 2014-05-26 NOTE — Progress Notes (Signed)
HPI: Kimberly Bishop is a 24 y.o. female who presents to the [facility] for follow-up of her HIV infection.  Allergies: No Known Allergies  Vitals: Temp: 97.9 F (36.6 C) (03/14 1529) Temp Source: Oral (03/14 1529) BP: 129/81 mmHg (03/14 1529) Pulse Rate: 86 (03/14 1529)  Past Medical History: Past Medical History  Diagnosis Date  . Immune deficiency disorder   . HIV (human immunodeficiency virus infection)   . 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 Marital Status: Single    Spouse Name: N/A  . Number of Children: N/A  . Years of Education: N/A   Social History Main Topics  . Smoking status: Never Smoker   . Smokeless tobacco: Never Used  . Alcohol Use: No     Comment: occasional  . Drug Use: No  . Sexual Activity:    Partners: Male    Birth Control/ Protection: Condom     Comment: same partner for a couple months   Other Topics Concern  . None   Social History Narrative    Previous ARTs: Programme researcher, broadcasting/film/video, Research officer, trade union, [employer]  Current Regimen: Tivicay + Truvada  Labs: HIV 1 RNA QUANT (copies/mL)  Date Value  05/19/2014 9864*  05/16/2013 4613*  03/28/2013 4764*   CD4 T CELL ABS (/uL)  Date Value  05/19/2014 20*  05/16/2013 60*  03/28/2013 60*   HEP B S AB (no units)  Date Value  12/09/2011 NEG   HEPATITIS B SURFACE AG (no units)  Date Value  12/09/2011 NEGATIVE   HCV AB (no units)  Date Value  12/09/2011 NEGATIVE    CrCl: Estimated Creatinine Clearance: 77.4 mL/min (by C-G formula based on Cr of 0.81).  Lipids:    Component Value Date/Time   CHOL 156 03/28/2013 1405   TRIG 67 03/28/2013 1405   HDL 46 03/28/2013 1405   CHOLHDL 3.4 03/28/2013 1405   VLDL 13 03/28/2013 1405   LDLCALC 97 03/28/2013 1405    Assessment: 24 yo f who presents to the [facility] for follow-up of her HIV infection.  She is currently taking Tivicay + Truvada and states that she cannot keep down the Truvada  because of the pill size.  Her viral load is currently 9864 and her CD4 count is 20. She is interested in switching therapy to a liquid formulation.  I spoke with patient and offered Tivicay + Edurant, which are very small pills, and suggested adding the new TAF Truvada when it comes to the market in hopefully April.  I also offered to formulate an all liquid regimen for her but explained it would be difficult and limits our options.  She is agreeable to try [place] for now with hopes of adding the new TAF Truvada in several weeks when it comes to market.  Recommendations: Discontinue Truvada Continue Tivicay Start Edurant Follow-up restarting new TAF Truvada when available  Addysyn Fern L. Nicole Kindred, PharmD Clinical Infectious Disease [facility] 05/26/2014, 3:59 PM

## 2014-05-26 NOTE — Progress Notes (Signed)
   Subjective:    Patient ID: Kimberly Bishop, female    DOB: October 25, 1990, 24 y.o.   MRN: [id number]  HPI She comes in for follow-up of her HIV. She was last seen over one year ago and was on a regimen of Tivicay and Truvada. She tells me that she had trouble taking the medication and would throw up the pills. She tells me that the Truvada was too big. She then never returned. She says she did the Tivicay okay.   Review of Systems  Constitutional: Negative for fever and fatigue.  HENT: Negative for trouble swallowing.   Eyes: Negative for visual disturbance.  Gastrointestinal: Negative for nausea and diarrhea.  Skin: Negative for rash.  Neurological: Negative for dizziness, light-headedness and headaches.       Objective:   Physical Exam  Constitutional: She appears well-developed and well-nourished. No distress.  HENT:  Mouth/Throat: No oropharyngeal exudate.  Eyes: No scleral icterus.  Cardiovascular: Normal rate, regular rhythm and normal heart sounds.   No murmur heard. Pulmonary/Chest: Effort normal and breath sounds normal. No respiratory distress. She has no wheezes.  Lymphadenopathy:    She has no cervical adenopathy.  Skin: No rash noted.          Assessment & Plan:

## 2014-06-23 ENCOUNTER — Encounter: Payer: Self-pay | Admitting: Obstetrics & Gynecology

## 2014-06-23 ENCOUNTER — Ambulatory Visit (INDEPENDENT_AMBULATORY_CARE_PROVIDER_SITE_OTHER): Payer: Managed Care, Other (non HMO) | Admitting: Obstetrics & Gynecology

## 2014-06-23 VITALS — BP 122/76 | HR 74 | Ht 62.0 in | Wt 97.1 lb

## 2014-06-23 DIAGNOSIS — Z3042 Encounter for surveillance of injectable contraceptive: Secondary | ICD-10-CM

## 2014-06-23 DIAGNOSIS — Z3009 Encounter for other general counseling and advice on contraception: Secondary | ICD-10-CM

## 2014-06-23 DIAGNOSIS — Z3202 Encounter for pregnancy test, result negative: Secondary | ICD-10-CM | POA: Diagnosis not present

## 2014-06-23 DIAGNOSIS — Z01419 Encounter for gynecological examination (general) (routine) without abnormal findings: Secondary | ICD-10-CM

## 2014-06-23 DIAGNOSIS — Z124 Encounter for screening for malignant neoplasm of cervix: Secondary | ICD-10-CM | POA: Diagnosis not present

## 2014-06-23 LAB — POCT PREGNANCY, URINE: Preg Test, Ur: NEGATIVE

## 2014-06-23 MED ORDER — MEDROXYPROGESTERONE ACETATE 150 MG/ML IM SUSP
150.0000 mg | INTRAMUSCULAR | Status: DC
  Administered 2014-06-23 – 2014-09-08 (×2): 150 mg via INTRAMUSCULAR

## 2014-06-23 NOTE — Patient Instructions (Addendum)
Medroxyprogesterone injection [Contraceptive] What is this medicine? MEDROXYPROGESTERONE (me DROX ee proe JES te rone) contraceptive injections prevent pregnancy. They provide effective birth control for 3 months. Depo-subQ Provera 104 is also used for treating pain related to endometriosis. This medicine may be used for other purposes; ask your health care provider or pharmacist if you have questions. COMMON BRAND NAME(S): Depo-Provera, Depo-subQ Provera 104 What should I tell my health care provider before I take this medicine? They need to know if you have any of these conditions: -frequently drink alcohol -asthma -blood vessel disease or a history of a blood clot in the lungs or legs -bone disease such as osteoporosis -breast cancer -diabetes -eating disorder (anorexia nervosa or bulimia) -high blood pressure -HIV infection or AIDS -kidney disease -liver disease -mental depression -migraine -seizures (convulsions) -stroke -tobacco smoker -vaginal bleeding -an unusual or allergic reaction to medroxyprogesterone, other hormones, medicines, foods, dyes, or preservatives -pregnant or trying to get pregnant -breast-feeding How should I use this medicine? Depo-Provera Contraceptive injection is given into a muscle. Depo-subQ Provera 104 injection is given under the skin. These injections are given by a health care professional. You must not be pregnant before getting an injection. The injection is usually given during the first 5 days after the start of a menstrual period or 6 weeks after delivery of a baby. Talk to your pediatrician regarding the use of this medicine in children. Special care may be needed. These injections have been used in female children who have started having menstrual periods. Overdosage: If you think you have taken too much of this medicine contact a poison control center or emergency room at once. NOTE: This medicine is only for you. Do not share this medicine  with others. What if I miss a dose? Try not to miss a dose. You must get an injection once every 3 months to maintain birth control. If you cannot keep an appointment, call and reschedule it. If you wait longer than 13 weeks between Depo-Provera contraceptive injections or longer than 14 weeks between Depo-subQ Provera 104 injections, you could get pregnant. Use another method for birth control if you miss your appointment. You may also need a pregnancy test before receiving another injection. What may interact with this medicine? Do not take this medicine with any of the following medications: -bosentan This medicine may also interact with the following medications: -aminoglutethimide -antibiotics or medicines for infections, especially rifampin, rifabutin, rifapentine, and griseofulvin -aprepitant -barbiturate medicines such as phenobarbital or primidone -bexarotene -carbamazepine -medicines for seizures like ethotoin, felbamate, oxcarbazepine, phenytoin, topiramate -modafinil -St. John's wort This list may not describe all possible interactions. Give your health care provider a list of all the medicines, herbs, non-prescription drugs, or dietary supplements you use. Also tell them if you smoke, drink alcohol, or use illegal drugs. Some items may interact with your medicine. What should I watch for while using this medicine? This drug does not protect you against HIV infection (AIDS) or other sexually transmitted diseases. Use of this product may cause you to lose calcium from your bones. Loss of calcium may cause weak bones (osteoporosis). Only use this product for more than 2 years if other forms of birth control are not right for you. The longer you use this product for birth control the more likely you will be at risk for weak bones. Ask your health care professional how you can keep strong bones. You may have a change in bleeding pattern or irregular periods. Many females stop having    periods while taking this drug. If you have received your injections on time, your chance of being pregnant is very low. If you think you may be pregnant, see your health care professional as soon as possible. Tell your health care professional if you want to get pregnant within the next year. The effect of this medicine may last a long time after you get your last injection. What side effects may I notice from receiving this medicine? Side effects that you should report to your doctor or health care professional as soon as possible: -allergic reactions like skin rash, itching or hives, swelling of the face, lips, or tongue -breast tenderness or discharge -breathing problems -changes in vision -depression -feeling faint or lightheaded, falls -fever -pain in the abdomen, chest, groin, or leg -problems with balance, talking, walking -unusually weak or tired -yellowing of the eyes or skin Side effects that usually do not require medical attention (report to your doctor or health care professional if they continue or are bothersome): -acne -fluid retention and swelling -headache -irregular periods, spotting, or absent periods -temporary pain, itching, or skin reaction at site where injected -weight gain This list may not describe all possible side effects. Call your doctor for medical advice about side effects. You may report side effects to FDA at 1-800-FDA-1088. Where should I keep my medicine? This does not apply. The injection will be given to you by a health care professional. NOTE: This sheet is a summary. It may not cover all possible information. If you have questions about this medicine, talk to your doctor, pharmacist, or health care provider.  2015, Elsevier/Gold Standard. (2008-03-21 18:37:56)  Molluscum Contagiosum Molluscum contagiosum is a viral infection of the skin that causes smooth surfaced, firm, small (3 to 5 mm), dome-shaped bumps (papules) which are flesh-colored.  The bumps usually do not hurt or itch. In children, they most often appear on the face, trunk, arms and legs. In adults, the growths are commonly found on the genitals, thighs, face, neck, and belly (abdomen). The infection may be spread to others by close (skin to skin) contact (such as occurs in schools and swimming pools), sharing towels and clothing, and through sexual contact. The bumps usually disappear without treatment in 2 to 4 months, especially in children. You may have them treated to avoid spreading them. Scraping (curetting) the middle part (central plug) of the bump with a needle or sharp curette, or application of liquid nitrogen for 8 or 9 seconds usually cures the infection. HOME CARE INSTRUCTIONS   Do not scratch the bumps. This may spread the infection to other parts of the body and to other people.  Avoid close contact with others, including sexual contact, until the bumps disappear. Do not share towels or clothing.  If liquid nitrogen was used, blisters will form. Leave the blisters alone and cover with a bandage. The tops will fall off by themselves in 7 to 14 days.  Four months without a lesion is usually a cure. SEEK IMMEDIATE MEDICAL CARE IF:  You have a fever.  You develop swelling, redness, pain, tenderness, or warmth in the areas of the bumps. They may be infected. Document Released: 02/26/2000 Document Revised: 05/23/2011 Document Reviewed: 08/08/2008 [facility] Patient Information 2015 [place], [place]. This information is not intended to replace advice given to you by your health care provider. Make sure you discuss any questions you have with your health care provider.

## 2014-06-23 NOTE — Progress Notes (Signed)
Patient ID: Kimberly Bishop, female   DOB: 20-Nov-1990, 24 y.o.   MRN: [id number] Subjective:     Kimberly Bishop is a 24 y.o. female here for a routine exam.  Current complaints: none.  Pt is not currently sexually active but, plans to start and wants contraception.   Gynecologic History Patient's last menstrual period was 05/06/2014. Contraception: abstinence Last Pap: 11/29/2012. Results were: normal.  Never had abnormal PAP STI: see below  Obstetric History OB History  Gravida Para Term Preterm AB SAB TAB Ectopic Multiple Living  1 0 0 0 1 1 0 0 0 0     # Outcome Date GA Lbr Len/2nd Weight Sex Delivery Anes PTL Lv  1 SAB 01/02/13 [redacted]w[redacted]d            Past Medical History  Diagnosis Date  . Immune deficiency disorder   . HIV (human immunodeficiency virus infection)   . 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 Appendectomy  2011  . No past surgeries     Current Outpatient Prescriptions on File Prior to Visit  Medication Sig Dispense Refill  . dolutegravir (TIVICAY) 50 MG tablet Take 1 tablet (50 mg total) by mouth daily. 30 tablet 5  . rilpivirine (EDURANT) 25 MG TABS tablet Take 1 tablet (25 mg total) by mouth daily with breakfast. 30 tablet 5   No current facility-administered medications on file prior to visit.   No Known Allergies\    The following portions of the patient's history were reviewed and updated as appropriate: allergies, current medications, past family history, past medical history, past social history, past surgical history and problem list.  Review of Systems A comprehensive review of systems was negative.    Objective:    BP 122/76 mmHg  Pulse 74  Ht 5\' 2"  (1.575 m)  Wt 97 lb 1.6 oz (44.044 kg)  BMI 17.76 kg/m2  LMP 05/06/2014  General Appearance:    Alert, cooperative, no distress, appears stated age  Head:    Normocephalic, without obvious abnormality, atraumatic  Eyes:     nonicteric  Ears:    Normal TM's and external ear canals, both ears  Nose:   Nares normal, septum midline, mucosa normal, no drainage    or sinus tenderness  Throat:   Lips, mucosa, and tongue normal; teeth and gums normal  Neck:   Supple, symmetrical, trachea midline, no adenopathy;    thyroid:  no enlargement/tenderness/nodules; no carotid   bruit or JVD  Back:     Symmetric, no curvature, ROM normal, no CVA tenderness  Lungs:     Clear to auscultation bilaterally, respirations unlabored  Chest Wall:    No tenderness or deformity   Heart:    Regular rate and rhythm, S1 and S2 normal, no murmur, rub   or gallop  Breast Exam:    No tenderness, masses, or nipple abnormality  Abdomen:     Soft, non-tender, bowel sounds active all four quadrants,    no masses, no organomegaly  Genitalia:    Normal female flesh colored lesions c/w molluscum contagiosum on mons pubis and labia: no CMT or cervical lesions noted.  No discharge or LA     Extremities:   Extremities normal, atraumatic, no cyanosis or edema  Pulses:   2+ and symmetric all extremities  Skin:   Skin color, texture, turgor normal, no rashes or lesions  Assessment:    Healthy female exam.   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  She is interested in treatement Contraception counseling- pt has never used Depo provera in the past but, i have reiewed her options and she wishes to try it. I have reviewed with her the common side effects.  She is interested in gaining weight.     Plan:    Follow up in: 1 year.    Depo Provera 150mg  IM F/u in 2-4 weeks for treatment of MC condoms at all times

## 2014-06-23 NOTE — Addendum Note (Signed)
Addended by: Samuel Germany on: 06/23/2014 03:38 PM   Modules accepted: Level of Service

## 2014-06-23 NOTE — Addendum Note (Signed)
Addended byGeralyn Flash L on: 06/23/2014 04:00 PM   Modules accepted: Orders

## 2014-06-23 NOTE — Addendum Note (Signed)
Addended by: Samuel Germany on: 06/23/2014 04:04 PM   Modules accepted: Orders

## 2014-06-24 LAB — CYTOLOGY - PAP

## 2014-06-25 ENCOUNTER — Ambulatory Visit: Payer: Managed Care, Other (non HMO) | Admitting: Internal Medicine

## 2014-08-01 ENCOUNTER — Other Ambulatory Visit: Payer: Self-pay | Admitting: Licensed Clinical Social Worker

## 2014-08-01 DIAGNOSIS — B20 Human immunodeficiency virus [HIV] disease: Secondary | ICD-10-CM

## 2014-08-01 MED ORDER — DOLUTEGRAVIR SODIUM 50 MG PO TABS: 50.0000 mg | ORAL_TABLET | Freq: Every day | ORAL | Status: DC

## 2014-08-01 MED ORDER — RILPIVIRINE HCL 25 MG PO TABS: 25.0000 mg | ORAL_TABLET | Freq: Every day | ORAL | Status: DC

## 2014-08-12 ENCOUNTER — Encounter: Payer: Self-pay | Admitting: Internal Medicine

## 2014-08-12 ENCOUNTER — Ambulatory Visit (INDEPENDENT_AMBULATORY_CARE_PROVIDER_SITE_OTHER): Payer: Self-pay | Admitting: Internal Medicine

## 2014-08-12 VITALS — BP 145/91 | HR 80 | Temp 98.3°F | Ht 62.0 in | Wt 98.0 lb

## 2014-08-12 DIAGNOSIS — R110 Nausea: Secondary | ICD-10-CM

## 2014-08-12 DIAGNOSIS — B20 Human immunodeficiency virus [HIV] disease: Secondary | ICD-10-CM

## 2014-08-12 MED ORDER — DOLUTEGRAVIR SODIUM 50 MG PO TABS: 50.0000 mg | ORAL_TABLET | Freq: Every day | ORAL | Status: DC

## 2014-08-12 MED ORDER — EMTRICITAB-RILPIVIR-TENOFOV AF 200-25-25 MG PO TABS: 1.0000 | ORAL_TABLET | Freq: Every day | ORAL | Status: DC

## 2014-08-12 NOTE — Progress Notes (Signed)
Patient ID: Kimberly Bishop, female   DOB: 10-25-1990, 24 y.o.   MRN: [id number] HPI: Kimberly Bishop is a 24 y.o. female who is here for her HIV f/u.   Allergies: No Known Allergies  Vitals: Temp: 98.3 F (36.8 C) (05/31 0930) Temp Source: Oral (05/31 0930) BP: 145/91 mmHg (05/31 0930) Pulse Rate: 80 (05/31 0930)  Past Medical History: Past Medical History  Diagnosis Date  . Immune deficiency disorder   . HIV (human immunodeficiency virus infection)   . 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 Marital Status: Single    Spouse Name: N/A  . Number of Children: N/A  . Years of Education: N/A   Social History Main Topics  . Smoking status: Never Smoker   . Smokeless tobacco: Never Used  . Alcohol Use: No     Comment: occasional  . Drug Use: No  . Sexual Activity:    Partners: Male    Birth Control/ Protection: Condom     Comment: same partner for a couple months   Other Topics Concern  . None   Social History Narrative    Previous Regimen:   Current Regimen: Tivicay/Endurant  Labs: HIV 1 RNA QUANT (copies/mL)  Date Value  05/19/2014 9864*  05/16/2013 4613*  03/28/2013 4764*   CD4 T CELL ABS (/uL)  Date Value  05/19/2014 20*  05/16/2013 60*  03/28/2013 60*   HEP B S AB (no units)  Date Value  12/09/2011 NEG   HEPATITIS B SURFACE AG (no units)  Date Value  12/09/2011 NEGATIVE   HCV AB (no units)  Date Value  12/09/2011 NEGATIVE    CrCl: CrCl cannot be calculated (Patient has no serum creatinine result on file.).  Lipids:    Component Value Date/Time   CHOL 156 03/28/2013 1405   TRIG 67 03/28/2013 1405   HDL 46 03/28/2013 1405   CHOLHDL 3.4 03/28/2013 1405   VLDL 13 03/28/2013 1405   LDLCALC 97 03/28/2013 1405    Assessment: 24 yo who is here for her HIV f/u. She was put on endurant and tivicay recently because she has trouble swallowing pills. Kimberly Bishop has smaller  size and she can take Tivicay fine, we are going to try that option instead. Explained the new meds to her and to avoid missing doses. She said that she will likely be able to swallow these pills now.   Recommendations: Cont Tivicay 50mg  PO qday Change rilpiverine to [place] 1 PO qday  [name], PharmD Clinical Infectious [facility] 08/12/2014, 9:57 AM

## 2014-08-12 NOTE — Progress Notes (Signed)
   Subjective:    Patient ID: France Ravens, female    DOB: 08/20/1990, 24 y.o.   MRN: [id number]  HPI She comes in for follow-up of her HIV. She was last seen in March after a one year abscence and was on a regimen of Tivicay and Truvada.  I changed her to [place] due to pill difficulty with swallowing but she unfortunately lost her insurance and only took Tivicay twice a day and never let us know.  Some weight loss.     Review of Systems  Constitutional: Negative for fever and fatigue.  HENT: Negative for trouble swallowing.   Eyes: Negative for visual disturbance.  Gastrointestinal: Negative for nausea and diarrhea.  Skin: Negative for rash.  Neurological: Negative for dizziness, light-headedness and headaches.       Objective:   Physical Exam  Constitutional: She appears well-developed and well-nourished. No distress.  HENT:  Mouth/Throat: No oropharyngeal exudate.  Eyes: No scleral icterus.  Cardiovascular: Normal rate, regular rhythm and normal heart sounds.   No murmur heard. Pulmonary/Chest: Effort normal and breath sounds normal. No respiratory distress. She has no wheezes.  Lymphadenopathy:    She has no cervical adenopathy.  Skin: No rash noted.          Assessment & Plan:  Patient ID: CHARLAYNE VULTAGGIO, female   DOB: 03-24-1990, 24 y.o.   MRN: [id number]

## 2014-08-12 NOTE — Assessment & Plan Note (Signed)
I emphasized the need for compliance.  She will start once she get the medication through drug assistance.  Will start [place] and [place].  Discussed with the pharmacist and she is in agreement with the pill size.  I will see her in about 5 weeks hopefully on medication. She needs prophylaxxis for PCP but I am most interested in her taking her ARVs and with difficulty with pills and medication, even liquids, will just focus on the ARVs.

## 2014-08-20 ENCOUNTER — Other Ambulatory Visit: Payer: Self-pay

## 2014-09-08 ENCOUNTER — Ambulatory Visit (INDEPENDENT_AMBULATORY_CARE_PROVIDER_SITE_OTHER): Payer: Medicaid Other | Admitting: General Practice

## 2014-09-08 ENCOUNTER — Telehealth: Payer: Self-pay | Admitting: Internal Medicine

## 2014-09-08 ENCOUNTER — Other Ambulatory Visit: Payer: Self-pay | Admitting: Licensed Clinical Social Worker

## 2014-09-08 VITALS — BP 128/87 | HR 62 | Temp 98.3°F | Ht 62.0 in | Wt 96.6 lb

## 2014-09-08 DIAGNOSIS — Z3042 Encounter for surveillance of injectable contraceptive: Secondary | ICD-10-CM | POA: Diagnosis present

## 2014-09-08 DIAGNOSIS — B20 Human immunodeficiency virus [HIV] disease: Secondary | ICD-10-CM

## 2014-09-08 MED ORDER — DOLUTEGRAVIR SODIUM 50 MG PO TABS: 50.0000 mg | ORAL_TABLET | Freq: Every day | ORAL | Status: DC

## 2014-09-08 MED ORDER — EMTRICITAB-RILPIVIR-TENOFOV AF 200-25-25 MG PO TABS: 1.0000 | ORAL_TABLET | Freq: Every day | ORAL | Status: DC

## 2014-09-09 NOTE — Telephone Encounter (Signed)
Medications sent to pharmacy on Monday.

## 2014-09-17 ENCOUNTER — Telehealth: Payer: Self-pay | Admitting: *Deleted

## 2014-09-17 NOTE — Telephone Encounter (Signed)
Contacted the patient at the number listed and introduced myself. RN stated she was following up on the patient's new regimen ([place] and Tivicay). Pt stated she has not picked her medications up yet from the pharmacy. Pt stated she was not sure if they were ready. Advised the patient that the medications were called in on 06/28 so they should be ready but I would not mind calling to confirm that they are ready. Contacted [facility] and spoke with Joaquim Lai who confirmed that the medications are ready for pick up. Contacted the patient back and advised her that the medications are ready for pick up. RN asked the patient if she would be interested in having her medications delivered to her since she lives in [place] and her pharmacy is in [place]. Pt stated" that would be perfect." RN explained to they patient my role here at the clinic and offered to continue to keep in contact with her as another support to keep her in care. Pt stated ok and I offered to text her my number for further communication. RN will also set up home medication delivery for the patient

## 2014-09-17 NOTE — Telephone Encounter (Signed)
Contacted [facility]/Rob and he stated he is willing to send the patient's medications to her home via [employer]. Rob is sending the medications via [employer], address was verfied. Contacted the patient and left her a message advising her that her medications will be sent via [employer]

## 2014-09-23 ENCOUNTER — Ambulatory Visit: Payer: Managed Care, Other (non HMO) | Admitting: Internal Medicine

## 2014-09-24 ENCOUNTER — Telehealth: Payer: Self-pay | Admitting: *Deleted

## 2014-09-24 NOTE — Telephone Encounter (Signed)
RN contacted the pt and left a message reminding the patient who I was and asked if she would return my call. Advised the pt that I would like to confirm that her medications were delivered. RN would also like to assess the pt's tolerance of the medications and offer my services if needed. Waiting on a return call from the patient

## 2014-11-24 ENCOUNTER — Ambulatory Visit: Payer: Medicaid Other

## 2015-02-07 ENCOUNTER — Emergency Department (HOSPITAL_COMMUNITY): Payer: Medicaid Other

## 2015-02-07 ENCOUNTER — Encounter (HOSPITAL_COMMUNITY): Payer: Self-pay | Admitting: *Deleted

## 2015-02-07 ENCOUNTER — Emergency Department (HOSPITAL_COMMUNITY)
Admission: EM | Admit: 2015-02-07 | Discharge: 2015-02-08 | Disposition: A | Discharge status: Home / Self Care | Payer: Medicaid Other | Attending: Emergency Medicine | Admitting: Emergency Medicine

## 2015-02-07 DIAGNOSIS — Z3202 Encounter for pregnancy test, result negative: Secondary | ICD-10-CM | POA: Insufficient documentation

## 2015-02-07 DIAGNOSIS — N898 Other specified noninflammatory disorders of vagina: Secondary | ICD-10-CM

## 2015-02-07 DIAGNOSIS — N390 Urinary tract infection, site not specified: Secondary | ICD-10-CM

## 2015-02-07 DIAGNOSIS — B20 Human immunodeficiency virus [HIV] disease: Secondary | ICD-10-CM | POA: Insufficient documentation

## 2015-02-07 DIAGNOSIS — Z79899 Other long term (current) drug therapy: Secondary | ICD-10-CM | POA: Insufficient documentation

## 2015-02-07 LAB — URINALYSIS, ROUTINE W REFLEX MICROSCOPIC
Bilirubin Urine: NEGATIVE
GLUCOSE, UA: NEGATIVE mg/dL
Ketones, ur: NEGATIVE mg/dL
Nitrite: POSITIVE — AB
PH: 7.5 (ref 5.0–8.0)
PROTEIN: 30 mg/dL — AB
Specific Gravity, Urine: 1.015 (ref 1.005–1.030)

## 2015-02-07 LAB — CBC
HEMATOCRIT: 34 % — AB (ref 36.0–46.0)
HEMOGLOBIN: 11.7 g/dL — AB (ref 12.0–15.0)
MCH: 29.3 pg (ref 26.0–34.0)
MCHC: 34.4 g/dL (ref 30.0–36.0)
MCV: 85.2 fL (ref 78.0–100.0)
Platelets: 276 10*3/uL (ref 150–400)
RBC: 3.99 MIL/uL (ref 3.87–5.11)
RDW: 13.2 % (ref 11.5–15.5)
WBC: 10.3 10*3/uL (ref 4.0–10.5)

## 2015-02-07 LAB — PREGNANCY, URINE: Preg Test, Ur: NEGATIVE

## 2015-02-07 LAB — URINE MICROSCOPIC-ADD ON

## 2015-02-07 MED ORDER — ONDANSETRON HCL 4 MG/2ML IJ SOLN
4.0000 mg | Freq: Once | INTRAMUSCULAR | Status: AC
  Administered 2015-02-07: 4 mg via INTRAVENOUS
  Filled 2015-02-07: qty 2

## 2015-02-07 MED ORDER — SODIUM CHLORIDE 0.9 % IV BOLUS (SEPSIS)
1000.0000 mL | Freq: Once | INTRAVENOUS | Status: AC
  Administered 2015-02-07: 1000 mL via INTRAVENOUS

## 2015-02-07 NOTE — ED Notes (Signed)
Pt c/o lower abdominal pain and n/v since 2pm.

## 2015-02-07 NOTE — ED Provider Notes (Signed)
CSN: [id number]     Arrival date & time 02/07/15  1909 History   First MD Initiated Contact with Patient 02/07/15 2244     Chief Complaint  Patient presents with  . Abdominal Pain     (Consider location/radiation/quality/duration/timing/severity/associated sxs/prior Treatment) The history is provided by the patient.   Kimberly Bishop is a 24 y.o. female with history significant for AIDS presenting with low pelvic pain along with nausea and vomiting starting this afternoon and vaginal discharge which has been present for the past several days. She denies fevers or chills, diarrhea.  She endorses fatigue and weakness, reporting has had no substantial intake since yesterday due to nausea.  She has found no alleviators for her symptoms.     Past Medical History  Diagnosis Date  . Immune deficiency disorder ([place])   . HIV (human immunodeficiency virus infection) ([place])   . AIDS (acquired immunodeficiency syndrome), CD4 <=200 [facility])    Past Surgical History  Procedure Laterality Date  . Appendectomy  2011  . No past surgeries     Family History  Problem Relation Age of Onset  . Adopted: Yes   Social History  Substance Use Topics  . Smoking status: Never Smoker   . Smokeless tobacco: Never Used  . Alcohol Use: Yes     Comment: occasional   OB History    Gravida Para Term Preterm AB TAB SAB Ectopic Multiple Living   1 0 0 0 1 0 1 0 0 0      Review of Systems  Constitutional: Positive for fatigue. Negative for fever and chills.  HENT: Negative for congestion and sore throat.   Eyes: Negative.   Respiratory: Negative for chest tightness and shortness of breath.   Cardiovascular: Negative for chest pain.  Gastrointestinal: Positive for nausea and vomiting. Negative for abdominal pain, diarrhea and constipation.  Genitourinary: Positive for vaginal discharge and pelvic pain. Negative for dysuria.  Musculoskeletal: Negative for joint swelling, arthralgias and neck pain.   Skin: Negative.  Negative for rash and wound.  Neurological: Negative for dizziness, weakness, light-headedness, numbness and headaches.  Psychiatric/Behavioral: Negative.       Allergies  Review of patient's allergies indicates no known allergies.  Home Medications   Prior to Admission medications   Medication Sig Start Date End Date Taking? Authorizing Provider  dolutegravir (TIVICAY) 50 MG tablet Take 1 tablet (50 mg total) by mouth daily. 09/08/14  Yes Thayer Headings, MD  emtricitabine-rilpivir-tenofovir AF (ODEFSEY) 200-25-25 MG TABS per tablet Take 1 tablet by mouth daily. 09/08/14  Yes Thayer Headings, MD  cephALEXin (KEFLEX) 500 MG capsule Take 1 capsule (500 mg total) by mouth 4 (four) times daily. 02/08/15   Evalee Jefferson, PA-C   BP 120/78 mmHg  Pulse 84  Temp(Src) 98 F (36.7 C) (Oral)  Resp 18  Ht 5\' 2"  (1.575 m)  Wt 44.963 kg  BMI 18.13 kg/m2  SpO2 100%  LMP 10/19/2014 Physical Exam  Constitutional: She appears well-developed and well-nourished.  HENT:  Head: Normocephalic and atraumatic.  Eyes: Conjunctivae are normal.  Neck: Normal range of motion.  Cardiovascular: Normal rate, regular rhythm, normal heart sounds and intact distal pulses.   Pulmonary/Chest: Effort normal and breath sounds normal. She has no wheezes.  Abdominal: Soft. Bowel sounds are normal. There is no tenderness.  Genitourinary: Uterus normal. Cervix exhibits motion tenderness and discharge. Right adnexum displays no mass and no tenderness. Left adnexum displays no mass and no tenderness. Vaginal discharge found.  Musculoskeletal:  Normal range of motion.  Neurological: She is alert.  Skin: Skin is warm and dry.  Psychiatric: She has a normal mood and affect.  Nursing note and vitals reviewed.   ED Course  Procedures (including critical care time) Labs Review Labs Reviewed  WET PREP, GENITAL - Abnormal; Notable for the following:    Clue Cells Wet Prep HPF POC FEW (*)    WBC, Wet Prep  HPF POC MANY (*)    All other components within normal limits  URINALYSIS, ROUTINE W REFLEX MICROSCOPIC (NOT AT [facility]) - Abnormal; Notable for the following:    APPearance HAZY (*)    Hgb urine dipstick LARGE (*)    Protein, ur 30 (*)    Nitrite POSITIVE (*)    Leukocytes, UA MODERATE (*)    All other components within normal limits  COMPREHENSIVE METABOLIC PANEL - Abnormal; Notable for the following:    Total Protein 8.6 (*)    All other components within normal limits  CBC - Abnormal; Notable for the following:    Hemoglobin 11.7 (*)    HCT 34.0 (*)    All other components within normal limits  URINE MICROSCOPIC-ADD ON - Abnormal; Notable for the following:    Squamous Epithelial / LPF 0-5 (*)    Bacteria, UA MANY (*)    All other components within normal limits  URINE CULTURE  PREGNANCY, URINE  LIPASE, BLOOD  RPR  GC/CHLAMYDIA PROBE AMP ([place]) NOT AT [facility]    Imaging Review Dg Abd Acute W/chest  02/08/2015  CLINICAL DATA:  Left lower abdominal pain for 2 days. Nonsmoker. Off. EXAM: DG ABDOMEN ACUTE W/ 1V CHEST COMPARISON:  Chest 09/05/2012 FINDINGS: Normal heart size and pulmonary vascularity. No focal airspace disease or consolidation in the lungs. No blunting of costophrenic angles. No pneumothorax. Mediastinal contours appear intact. Staghorn type calculus over the right kidney similar to previous study. Scattered gas and stool in the colon. No small or large bowel distention. No free intra-abdominal air. No abnormal air-fluid levels. Visualized bones appear intact. IMPRESSION: No evidence of active pulmonary disease. Right renal staghorn calculus. Nonobstructive bowel gas pattern. Electronically Signed   By: Lucienne Capers M.D.   On: 02/08/2015 00:35   I have personally reviewed and evaluated these images and lab results as part of my medical decision-making.   EKG Interpretation None      MDM   Final diagnoses:  UTI (lower urinary tract infection)  Vaginal  discharge    Patients labs reviewed.  Radiological studies were viewed, interpreted and considered during the medical decision making and disposition process. I agree with radiologists reading.  Results were also discussed with patient. Pt with uti, cervicitis, cultures pending including gc/chlamydia and urine cx.  Right renal stone chronic.  Pt givne rocephin injection, zithromax PO, placed on keflex for uti.  Given IV fluids, zofran with no emesis while in dept.  Plan f/uw with pcp for a recheck if sx persist or worsen.    The patient appears reasonably screened and/or stabilized for discharge and I doubt any other medical condition or other [facility] requiring further screening, evaluation, or treatment in the ED at this time prior to discharge.      Evalee Jefferson, PA-C [id number]  Delora Fuel, MD [id number] [phone]

## 2015-02-08 LAB — COMPREHENSIVE METABOLIC PANEL
ALK PHOS: 86 U/L (ref 38–126)
ALT: 16 U/L (ref 14–54)
ANION GAP: 7 (ref 5–15)
AST: 22 U/L (ref 15–41)
Albumin: 4.1 g/dL (ref 3.5–5.0)
BILIRUBIN TOTAL: 0.7 mg/dL (ref 0.3–1.2)
BUN: 9 mg/dL (ref 6–20)
CALCIUM: 9.3 mg/dL (ref 8.9–10.3)
CO2: 25 mmol/L (ref 22–32)
Chloride: 109 mmol/L (ref 101–111)
Creatinine, Ser: 0.74 mg/dL (ref 0.44–1.00)
GFR calc Af Amer: >60 mL/min (ref >60)
Glucose, Bld: 98 mg/dL (ref 65–99)
POTASSIUM: 3.6 mmol/L (ref 3.5–5.1)
Sodium: 141 mmol/L (ref 135–145)
TOTAL PROTEIN: 8.6 g/dL — AB (ref 6.5–8.1)

## 2015-02-08 LAB — WET PREP, GENITAL
Sperm: NONE SEEN
Trich, Wet Prep: NONE SEEN
YEAST WET PREP: NONE SEEN

## 2015-02-08 LAB — LIPASE, BLOOD: Lipase: 21 U/L (ref 11–51)

## 2015-02-08 MED ORDER — LIDOCAINE HCL (PF) 1 % IJ SOLN
INTRAMUSCULAR | Status: AC
  Administered 2015-02-08: 1.2 mL
  Filled 2015-02-08: qty 5

## 2015-02-08 MED ORDER — CEPHALEXIN 500 MG PO CAPS: 500.0000 mg | ORAL_CAPSULE | Freq: Four times a day (QID) | ORAL | Status: DC

## 2015-02-08 MED ORDER — CEFTRIAXONE SODIUM 250 MG IJ SOLR
250.0000 mg | Freq: Once | INTRAMUSCULAR | Status: AC
  Administered 2015-02-08: 250 mg via INTRAMUSCULAR
  Filled 2015-02-08: qty 250

## 2015-02-08 MED ORDER — AZITHROMYCIN 250 MG PO TABS
1000.0000 mg | ORAL_TABLET | Freq: Once | ORAL | Status: AC
  Administered 2015-02-08: 1000 mg via ORAL
  Filled 2015-02-08: qty 4

## 2015-02-08 NOTE — ED Notes (Signed)
Discharge instructions given, pt demonstrated teach back and verbal understanding. No concerns voiced.  

## 2015-02-08 NOTE — Discharge Instructions (Signed)
Urinary Tract Infection Urinary tract infections (UTIs) can develop anywhere along your urinary tract. Your urinary tract is your body's drainage system for removing wastes and extra water. Your urinary tract includes two kidneys, two ureters, a bladder, and a urethra. Your kidneys are a pair of bean-shaped organs. Each kidney is about the size of your fist. They are located below your ribs, one on each side of your spine. CAUSES Infections are caused by microbes, which are microscopic organisms, including fungi, viruses, and bacteria. These organisms are so small that they can only be seen through a microscope. Bacteria are the microbes that most commonly cause UTIs. SYMPTOMS  Symptoms of UTIs may vary by age and gender of the patient and by the location of the infection. Symptoms in young women typically include a frequent and intense urge to urinate and a painful, burning feeling in the bladder or urethra during urination. Older women and men are more likely to be tired, shaky, and weak and have muscle aches and abdominal pain. A fever may mean the infection is in your kidneys. Other symptoms of a kidney infection include pain in your back or sides below the ribs, nausea, and vomiting. DIAGNOSIS To diagnose a UTI, your caregiver will ask you about your symptoms. Your caregiver will also ask you to provide a urine sample. The urine sample will be tested for bacteria and white blood cells. White blood cells are made by your body to help fight infection. TREATMENT  Typically, UTIs can be treated with medication. Because most UTIs are caused by a bacterial infection, they usually can be treated with the use of antibiotics. The choice of antibiotic and length of treatment depend on your symptoms and the type of bacteria causing your infection. HOME CARE INSTRUCTIONS  If you were prescribed antibiotics, take them exactly as your caregiver instructs you. Finish the medication even if you feel better after  you have only taken some of the medication.  Drink enough water and fluids to keep your urine clear or pale yellow.  Avoid caffeine, tea, and carbonated beverages. They tend to irritate your bladder.  Empty your bladder often. Avoid holding urine for long periods of time.  Empty your bladder before and after sexual intercourse.  After a bowel movement, women should cleanse from front to back. Use each tissue only once. SEEK MEDICAL CARE IF:   You have back pain.  You develop a fever.  Your symptoms do not begin to resolve within 3 days. SEEK IMMEDIATE MEDICAL CARE IF:   You have severe back pain or lower abdominal pain.  You develop chills.  You have nausea or vomiting.  You have continued burning or discomfort with urination. MAKE SURE YOU:   Understand these instructions.  Will watch your condition.  Will get help right away if you are not doing well or get worse.   This information is not intended to replace advice given to you by your health care provider. Make sure you discuss any questions you have with your health care provider.   Document Released: 12/08/2004 Document Revised: 11/19/2014 Document Reviewed: 04/08/2011 Elsevier Interactive Patient Education 2016 [place] your entire course of the antibiotic prescribed. You have also been treated for both gonorrhea and chlamydia with the medicines you received here.  Your cultures are pending as discussed, you will be notified if either is positive.  Do not have sex until the cultures are known - your partner will need to be treated if you  have either infection.

## 2015-02-09 LAB — URINE CULTURE

## 2015-02-09 LAB — GC/CHLAMYDIA PROBE AMP (~~LOC~~) NOT AT ARMC
Chlamydia: POSITIVE — AB
Neisseria Gonorrhea: NEGATIVE

## 2015-02-09 LAB — RPR: RPR Ser Ql: NONREACTIVE

## 2015-02-10 ENCOUNTER — Telehealth (HOSPITAL_COMMUNITY): Payer: Self-pay

## 2015-02-10 NOTE — Telephone Encounter (Signed)
Spoke with pt. Verified ID. Informed of labs. Treated per protocol. DHHS form faxed. Pt informed to abstain from sexual activity x 10 days and to notify partner for testing and treatment.  

## 2015-02-26 ENCOUNTER — Encounter: Payer: Self-pay | Admitting: Internal Medicine

## 2015-02-26 ENCOUNTER — Ambulatory Visit (INDEPENDENT_AMBULATORY_CARE_PROVIDER_SITE_OTHER): Payer: Self-pay | Admitting: Internal Medicine

## 2015-02-26 ENCOUNTER — Telehealth: Payer: Self-pay | Admitting: *Deleted

## 2015-02-26 VITALS — BP 150/85 | HR 75 | Temp 98.0°F | Wt 105.0 lb

## 2015-02-26 DIAGNOSIS — B20 Human immunodeficiency virus [HIV] disease: Secondary | ICD-10-CM

## 2015-02-26 DIAGNOSIS — N760 Acute vaginitis: Secondary | ICD-10-CM

## 2015-02-26 DIAGNOSIS — A749 Chlamydial infection, unspecified: Secondary | ICD-10-CM

## 2015-02-26 DIAGNOSIS — L282 Other prurigo: Secondary | ICD-10-CM

## 2015-02-26 DIAGNOSIS — A499 Bacterial infection, unspecified: Secondary | ICD-10-CM

## 2015-02-26 DIAGNOSIS — B9689 Other specified bacterial agents as the cause of diseases classified elsewhere: Secondary | ICD-10-CM | POA: Insufficient documentation

## 2015-02-26 LAB — COMPREHENSIVE METABOLIC PANEL
ALBUMIN: 4 g/dL (ref 3.6–5.1)
ALK PHOS: 79 U/L (ref 33–115)
ALT: 10 U/L (ref 6–29)
AST: 18 U/L (ref 10–30)
BUN: 10 mg/dL (ref 7–25)
CO2: 23 mmol/L (ref 20–31)
CREATININE: 0.86 mg/dL (ref 0.50–1.10)
Calcium: 8.8 mg/dL (ref 8.6–10.2)
Chloride: 107 mmol/L (ref 98–110)
Glucose, Bld: 77 mg/dL (ref 65–99)
Potassium: 3.7 mmol/L (ref 3.5–5.3)
SODIUM: 138 mmol/L (ref 135–146)
TOTAL PROTEIN: 7.6 g/dL (ref 6.1–8.1)
Total Bilirubin: 0.6 mg/dL (ref 0.2–1.2)

## 2015-02-26 LAB — CBC
HCT: 33.4 % — ABNORMAL LOW (ref 36.0–46.0)
HEMOGLOBIN: 11 g/dL — AB (ref 12.0–15.0)
MCH: 28.3 pg (ref 26.0–34.0)
MCHC: 32.9 g/dL (ref 30.0–36.0)
MCV: 85.9 fL (ref 78.0–100.0)
MPV: 10.6 fL (ref 8.6–12.4)
PLATELETS: 308 10*3/uL (ref 150–400)
RBC: 3.89 MIL/uL (ref 3.87–5.11)
RDW: 14.4 % (ref 11.5–15.5)
WBC: 4.1 10*3/uL (ref 4.0–10.5)

## 2015-02-26 MED ORDER — METRONIDAZOLE 500 MG PO TABS: 500.0000 mg | ORAL_TABLET | Freq: Two times a day (BID) | ORAL | Status: DC

## 2015-02-26 NOTE — Assessment & Plan Note (Signed)
I suspect that her pruritic rash may be due to an allergic reaction to cephalexin. I asked her to stop taking cephalexin now.

## 2015-02-26 NOTE — Telephone Encounter (Signed)
rash on thighs, arms, hands and stomach, requesting appt.  Appointment given for this afternoon.

## 2015-02-26 NOTE — Assessment & Plan Note (Signed)
I will treat her bacterial vaginosis with a one-week course of metronidazole. She does not drink alcohol.

## 2015-02-26 NOTE — Assessment & Plan Note (Addendum)
She is tolerating her current antiretroviral regimen and it sounds like she is working to improve her adherence. The fact that she is feeling better and has gained weight suggest that her infection is responding to therapy. I will continue her current regimen and repeat lab work today and see her back next week.  Depending on what her CD4 count is now she may need to start on prophylactic therapy for pneumocystis, mycobacterium avium and candidiasis next week.

## 2015-02-26 NOTE — Assessment & Plan Note (Signed)
She received appropriate therapy for her chlamydia in the emergency department.

## 2015-02-26 NOTE — Progress Notes (Signed)
Patient Active Problem List   Diagnosis Date Noted  . HIV disease ([place]) 07/03/2012    Priority: High  . Bacterial vaginosis 02/26/2015    Priority: Medium  . Chlamydia 02/26/2015    Priority: Medium  . Pruritic rash 02/26/2015    Priority: Medium  . Chronic nausea 03/28/2013  . 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 Perinatal HIV exposure 12/16/2011    Patient's Medications  New Prescriptions   METRONIDAZOLE (FLAGYL) 500 MG TABLET    Take 1 tablet (500 mg total) by mouth 2 (two) times daily.  Previous Medications   DOLUTEGRAVIR (TIVICAY) 50 MG TABLET    Take 1 tablet (50 mg total) by mouth daily.   EMTRICITABINE-RILPIVIR-TENOFOVIR AF (ODEFSEY) 200-25-25 MG TABS PER TABLET    Take 1 tablet by mouth daily.  Modified Medications   No medications on file  Discontinued Medications   CEPHALEXIN (KEFLEX) 500 MG CAPSULE    Take 1 capsule (500 mg total) by mouth 4 (four) times daily.   CEPHALEXIN (KEFLEX) 500 MG CAPSULE    Take 500 mg by mouth 4 (four) times daily.    Subjective: Kimberly Bishop is seen on a work in basis. She is followed by my partner, Dr. Talbot Grumbling, for her HIV infection. She was changed to a regimen of [place] and Tivicay at the time of her last visit in May. She started taking her medications in June. She estimates that she is missed a total of 14 days of therapy over the past 6 months. This has happened sporadically. She was working 2 jobs in some days she was in such a rash to get to work that she would forget to take her medication. She is now working only one job. She has set her cell phone to remind her to take her medicine each morning with breakfast. She is feeling much better since she started on her new regimen. She has more energy and she is gaining weight. She's not having any problem swallowing or tolerating her medication.  She recently developed some vaginal discharge and was seen in the emergency department. She was treated with an IM  injection of ceftriaxone 250 mg and 1 g of azithromycin. She was given a prescription for cephalexin to be taken for 1 week for a possible urinary tract infection. Her urine chlamydia screen was positive. GC screen was negative and her urine culture was negative. She filled her cephalexin prescription on 02/10/2015. Initially she took it as instructed 4 times daily but then felt better and has only been taking it once in the evening recently. 3 days ago she developed a pruritic rash on her hands and legs.   Review of Systems: Review of Systems  Constitutional: Negative for fever, chills, weight loss, malaise/fatigue and diaphoresis.  HENT: Negative for sore throat.   Eyes: Negative for blurred vision.  Respiratory: Negative for cough, sputum production and shortness of breath.   Cardiovascular: Negative for chest pain.  Gastrointestinal: Negative for nausea, vomiting and diarrhea.  Genitourinary: Negative for dysuria and frequency.  Musculoskeletal: Negative for joint pain.  Skin: Positive for itching and rash.  Neurological: Negative for headaches.  Psychiatric/Behavioral: Negative for depression and substance abuse. The patient is not nervous/anxious.     Past Medical History  Diagnosis Date  . Immune deficiency disorder ([place])   . HIV (human immunodeficiency virus infection) ([place])   . AIDS (acquired immunodeficiency syndrome), CD4 <=200 ([place])     Social  History  Substance Use Topics  . Smoking status: Never Smoker   . Smokeless tobacco: Never Used  . Alcohol Use: No     Comment: occasional    Family History  Problem Relation Age of Onset  . Adopted: Yes  . Hypertension Mother     No Known Allergies  Objective:  Filed Vitals:   02/26/15 1554  BP: 150/85  Pulse: 75  Temp: 98 F (36.7 C)  TempSrc: Oral  Weight: 105 lb (47.628 kg)   Body mass index is 19.2 kg/(m^2).  Physical Exam  Constitutional: She is oriented to person, place, and time.  She is well dressed,  smiling and in good spirits.  HENT:  Mouth/Throat: No oropharyngeal exudate.  Eyes: Conjunctivae are normal.  Cardiovascular: Normal rate and regular rhythm.   No murmur heard. Pulmonary/Chest: Breath sounds normal.  Abdominal: Soft. She exhibits no mass. There is no tenderness.  Neurological: She is alert and oriented to person, place, and time.  Skin: Rash noted.  She has small papules on her arms and legs with sparing of her palms face and scalp.  Psychiatric: Mood and affect normal.    Lab Results Lab Results  Component Value Date   WBC 10.3 02/07/2015   HGB 11.7* 02/07/2015   HCT 34.0* 02/07/2015   MCV 85.2 02/07/2015   PLT 276 02/07/2015    Lab Results  Component Value Date   CREATININE 0.74 02/07/2015   BUN 9 02/07/2015   NA 141 02/07/2015   K 3.6 02/07/2015   CL 109 02/07/2015   CO2 25 02/07/2015    Lab Results  Component Value Date   ALT 16 02/07/2015   AST 22 02/07/2015   ALKPHOS 86 02/07/2015   BILITOT 0.7 02/07/2015    Lab Results  Component Value Date   CHOL 156 03/28/2013   HDL 46 03/28/2013   LDLCALC 97 03/28/2013   TRIG 67 03/28/2013   CHOLHDL 3.4 03/28/2013    Lab Results HIV 1 RNA QUANT (copies/mL)  Date Value  05/19/2014 9864*  05/16/2013 4613*  03/28/2013 4764*   CD4 T CELL ABS (/uL)  Date Value  05/19/2014 20*  05/16/2013 60*  03/28/2013 60*      Problem List Items Addressed This Visit      High   HIV disease ([place])    She is tolerating her current antiretroviral regimen and it sounds like she is working to improve her adherence. The fact that she is feeling better and has gained weight suggest that her infection is responding to therapy. I will continue her current regimen and repeat lab work today and see her back next week.  Depending on what her CD4 count is now she may need to start on prophylactic therapy for pneumocystis, mycobacterium avium and candidiasis next week.      Relevant Medications   metroNIDAZOLE (FLAGYL)  500 MG tablet   Other Relevant Orders   T-helper cell (CD4)- ([facility] clinic only)   HIV 1 RNA quant-no reflex-bld   CBC   Comprehensive metabolic panel     Medium   Bacterial vaginosis - Primary    I will treat her bacterial vaginosis with a one-week course of metronidazole. She does not drink alcohol.      Relevant Medications   metroNIDAZOLE (FLAGYL) 500 MG tablet   Chlamydia    She received appropriate therapy for her chlamydia in the emergency department.      Relevant Medications   metroNIDAZOLE (FLAGYL) 500 MG tablet  Pruritic rash    I suspect that her pruritic rash may be due to an allergic reaction to cephalexin. I asked her to stop taking cephalexin now.           Michel Bickers, MD [facility] [phone] pager   [phone] cell 02/26/2015, 4:43 PM

## 2015-02-27 LAB — T-HELPER CELL (CD4) - (RCID CLINIC ONLY)
CD4 T CELL ABS: 40 /uL — AB (ref 400–2700)
CD4 T CELL HELPER: 7 % — AB (ref 33–55)

## 2015-03-01 LAB — HIV-1 RNA QUANT-NO REFLEX-BLD
HIV 1 RNA Quant: <20 copies/mL (ref <20)
HIV-1 RNA Quant, Log: <1.3 Log copies/mL (ref <1.30)

## 2015-03-02 ENCOUNTER — Ambulatory Visit (INDEPENDENT_AMBULATORY_CARE_PROVIDER_SITE_OTHER): Payer: Medicaid Other | Admitting: Internal Medicine

## 2015-03-02 ENCOUNTER — Encounter: Payer: Self-pay | Admitting: Internal Medicine

## 2015-03-02 DIAGNOSIS — B20 Human immunodeficiency virus [HIV] disease: Secondary | ICD-10-CM

## 2015-03-02 MED ORDER — SULFAMETHOXAZOLE-TRIMETHOPRIM 800-160 MG PO TABS
1.0000 | ORAL_TABLET | Freq: Every day | ORAL | Status: DC
Start: 2015-03-02 — End: 2015-11-24

## 2015-03-02 MED ORDER — AZITHROMYCIN 600 MG PO TABS: 1200.0000 mg | ORAL_TABLET | ORAL | Status: DC

## 2015-03-02 MED ORDER — FLUCONAZOLE 100 MG PO TABS
100.0000 mg | ORAL_TABLET | ORAL | Status: DC
Start: 2015-03-02 — End: 2015-11-24

## 2015-03-02 NOTE — Progress Notes (Signed)
Patient Active Problem List   Diagnosis Date Noted  . HIV disease ([place]) 07/03/2012    Priority: High  . Bacterial vaginosis 02/26/2015    Priority: Medium  . Chlamydia 02/26/2015    Priority: Medium  . Pruritic rash 02/26/2015    Priority: Medium  . Chronic nausea 03/28/2013  . 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 Perinatal HIV exposure 12/16/2011    Patient's Medications  New Prescriptions   AZITHROMYCIN (ZITHROMAX) 600 MG TABLET    Take 2 tablets (1,200 mg total) by mouth once a week.   FLUCONAZOLE (DIFLUCAN) 100 MG TABLET    Take 1 tablet (100 mg total) by mouth once a week.   SULFAMETHOXAZOLE-TRIMETHOPRIM (BACTRIM DS,SEPTRA DS) 800-160 MG TABLET    Take 1 tablet by mouth daily.  Previous Medications   DOLUTEGRAVIR (TIVICAY) 50 MG TABLET    Take 1 tablet (50 mg total) by mouth daily.   EMTRICITABINE-RILPIVIR-TENOFOVIR AF (ODEFSEY) 200-25-25 MG TABS PER TABLET    Take 1 tablet by mouth daily.  Modified Medications   No medications on file  Discontinued Medications   METRONIDAZOLE (FLAGYL) 500 MG TABLET    Take 1 tablet (500 mg total) by mouth 2 (two) times daily.    Subjective: Kimberly Bishop is in for her routine follow-up visit. She continues to take her Tivicay and [name] and has not missed any doses. She has her cell phone alarm set to remind her to take it. She completed her metronidazole therapy for bacterial vaginosis. She was also recently treated for chlamydia. After the diagnosis of chlamydia her boyfriend started using latex condoms. She now recalls that shortly after that she began having the slightly pruritic rash. The rash was most noticeable on her inner thighs and groin. She continues to be bothered by the pruritus after having stopped cephalexin last week.   Review of Systems: Review of Systems  Constitutional: Negative for fever, chills, weight loss, malaise/fatigue and diaphoresis.  HENT: Negative for sore throat.   Respiratory:  Negative for cough, sputum production and shortness of breath.   Cardiovascular: Negative for chest pain.  Gastrointestinal: Negative for nausea, vomiting and diarrhea.  Genitourinary: Negative for dysuria and frequency.  Musculoskeletal: Negative for myalgias and joint pain.  Skin: Positive for itching and rash.  Neurological: Negative for focal weakness.  Psychiatric/Behavioral: Negative for depression and substance abuse. The patient is not nervous/anxious.     Past Medical History  Diagnosis Date  . Immune deficiency disorder ([place])   . HIV (human immunodeficiency virus infection) ([place])   . AIDS (acquired immunodeficiency syndrome), CD4 <=200 ([place])     Social History  Substance Use Topics  . Smoking status: Never Smoker   . Smokeless tobacco: Never Used  . Alcohol Use: No     Comment: occasional    Family History  Problem Relation Age of Onset  . Adopted: Yes  . Hypertension Mother     No Known Allergies  Objective:  Filed Vitals:   03/02/15 1606  BP: 131/84  Pulse: 66  Temp: 98.1 F (36.7 C)  TempSrc: Oral  Weight: 104 lb (47.174 kg)   Body mass index is 19.02 kg/(m^2).  Physical Exam  Constitutional: She is oriented to person, place, and time.  She is smiling and in good spirits.  Eyes: Conjunctivae are normal.  Cardiovascular: Normal rate and regular rhythm.   No murmur heard. Pulmonary/Chest: Breath sounds normal.  Abdominal: Soft. She exhibits no mass. There  is no tenderness.  Musculoskeletal: Normal range of motion.  Neurological: She is alert and oriented to person, place, and time.  Skin: Rash noted.  She has a very fine papular rash on her arms, hands and legs. This is unchanged since last week.  Psychiatric: Mood and affect normal.    Lab Results Lab Results  Component Value Date   WBC 4.1 02/26/2015   HGB 11.0* 02/26/2015   HCT 33.4* 02/26/2015   MCV 85.9 02/26/2015   PLT 308 02/26/2015    Lab Results  Component Value Date    CREATININE 0.86 02/26/2015   BUN 10 02/26/2015   NA 138 02/26/2015   K 3.7 02/26/2015   CL 107 02/26/2015   CO2 23 02/26/2015    Lab Results  Component Value Date   ALT 10 02/26/2015   AST 18 02/26/2015   ALKPHOS 79 02/26/2015   BILITOT 0.6 02/26/2015    Lab Results  Component Value Date   CHOL 156 03/28/2013   HDL 46 03/28/2013   LDLCALC 97 03/28/2013   TRIG 67 03/28/2013   CHOLHDL 3.4 03/28/2013    Lab Results HIV 1 RNA QUANT (copies/mL)  Date Value  02/26/2015 <20  05/19/2014 9864*  05/16/2013 4613*   CD4 T CELL ABS (/uL)  Date Value  02/26/2015 40*  05/19/2014 20*  05/16/2013 60*      Problem List Items Addressed This Visit      High   HIV disease ([place])    Her HIV infection is under much better control. Her adherence seems to have improved over the past 6 months. I talked to her about the importance of condom use to avoid superinfection with more drug resistant strains of HIV and/or the development of other sexually transmitted diseases. I do not think that her rash is likely to be due to [place] or Odefsey. She thinks it may be related to latex and her condoms. She will try nonlatex condoms and follow-up in 3 months.      Relevant Medications   sulfamethoxazole-trimethoprim (BACTRIM DS,SEPTRA DS) 800-160 MG tablet   fluconazole (DIFLUCAN) 100 MG tablet   azithromycin (ZITHROMAX) 600 MG tablet        Michel Bickers, MD [facility] for [facility] [place] [phone] pager   [phone] cell 03/02/2015, 5:27 PM

## 2015-03-02 NOTE — Assessment & Plan Note (Signed)
Her HIV infection is under much better control. Her adherence seems to have improved over the past 6 months. I talked to her about the importance of condom use to avoid superinfection with more drug resistant strains of HIV and/or the development of other sexually transmitted diseases. I do not think that her rash is likely to be due to [place] or Odefsey. She thinks it may be related to latex and her condoms. She will try nonlatex condoms and follow-up in 3 months.

## 2015-05-26 ENCOUNTER — Ambulatory Visit: Payer: Medicaid Other | Admitting: Internal Medicine

## 2015-09-16 ENCOUNTER — Ambulatory Visit: Payer: Medicaid Other

## 2015-09-22 ENCOUNTER — Ambulatory Visit: Payer: Medicaid Other

## 2015-10-20 ENCOUNTER — Ambulatory Visit: Payer: Self-pay

## 2015-10-27 ENCOUNTER — Other Ambulatory Visit (INDEPENDENT_AMBULATORY_CARE_PROVIDER_SITE_OTHER): Payer: Self-pay

## 2015-10-27 DIAGNOSIS — Z113 Encounter for screening for infections with a predominantly sexual mode of transmission: Secondary | ICD-10-CM

## 2015-10-27 DIAGNOSIS — B20 Human immunodeficiency virus [HIV] disease: Secondary | ICD-10-CM

## 2015-10-27 DIAGNOSIS — Z79899 Other long term (current) drug therapy: Secondary | ICD-10-CM

## 2015-10-27 LAB — CBC WITH DIFFERENTIAL/PLATELET
BASOS ABS: 0 {cells}/uL (ref 0–200)
Basophils Relative: 0 %
EOS ABS: 92 {cells}/uL (ref 15–500)
EOS PCT: 2 %
HEMATOCRIT: 35.9 % (ref 35.0–45.0)
HEMOGLOBIN: 12.1 g/dL (ref 11.7–15.5)
Lymphs Abs: 644 cells/uL — ABNORMAL LOW (ref 850–3900)
MCH: 29.6 pg (ref 27.0–33.0)
MCHC: 33.7 g/dL (ref 32.0–36.0)
MCV: 87.8 fL (ref 80.0–100.0)
MONO ABS: 460 {cells}/uL (ref 200–950)
MPV: 10.6 fL (ref 7.5–12.5)
Monocytes Relative: 10 %
NEUTROS ABS: 3404 {cells}/uL (ref 1500–7800)
Neutrophils Relative %: 74 %
Platelets: 262 10*3/uL (ref 140–400)
RBC: 4.09 MIL/uL (ref 3.80–5.10)
RDW: 14 % (ref 11.0–15.0)
WBC: 4.6 10*3/uL (ref 3.8–10.8)

## 2015-10-27 LAB — COMPREHENSIVE METABOLIC PANEL
ALT: 16 U/L (ref 6–29)
AST: 21 U/L (ref 10–30)
Albumin: 4.1 g/dL (ref 3.6–5.1)
Alkaline Phosphatase: 100 U/L (ref 33–115)
BILIRUBIN TOTAL: 0.7 mg/dL (ref 0.2–1.2)
BUN: 9 mg/dL (ref 7–25)
CALCIUM: 9.4 mg/dL (ref 8.6–10.2)
CHLORIDE: 104 mmol/L (ref 98–110)
CO2: 23 mmol/L (ref 20–31)
Creat: 0.9 mg/dL (ref 0.50–1.10)
GLUCOSE: 74 mg/dL (ref 65–99)
Potassium: 3.8 mmol/L (ref 3.5–5.3)
SODIUM: 138 mmol/L (ref 135–146)
Total Protein: 7.5 g/dL (ref 6.1–8.1)

## 2015-10-27 LAB — LIPID PANEL
CHOL/HDL RATIO: 2.9 ratio (ref <=5.0)
Cholesterol: 165 mg/dL (ref 125–200)
HDL: 56 mg/dL (ref >=46)
LDL CALC: 100 mg/dL (ref <130)
Triglycerides: 45 mg/dL (ref <150)
VLDL: 9 mg/dL (ref <30)

## 2015-10-28 LAB — T-HELPER CELL (CD4) - (RCID CLINIC ONLY)
CD4 T CELL ABS: 70 /uL — AB (ref 400–2700)
CD4 T CELL HELPER: 10 % — AB (ref 33–55)

## 2015-10-28 LAB — URINE CYTOLOGY ANCILLARY ONLY
Chlamydia: NEGATIVE
NEISSERIA GONORRHEA: NEGATIVE

## 2015-10-29 LAB — HIV-1 RNA QUANT-NO REFLEX-BLD
HIV 1 RNA Quant: 625 copies/mL — ABNORMAL HIGH (ref <20)
HIV-1 RNA Quant, Log: 2.8 Log copies/mL — ABNORMAL HIGH (ref <1.30)

## 2015-11-04 ENCOUNTER — Encounter: Payer: Self-pay | Admitting: Internal Medicine

## 2015-11-24 ENCOUNTER — Encounter: Payer: Self-pay | Admitting: Internal Medicine

## 2015-11-24 ENCOUNTER — Ambulatory Visit (INDEPENDENT_AMBULATORY_CARE_PROVIDER_SITE_OTHER): Payer: Self-pay | Admitting: Internal Medicine

## 2015-11-24 VITALS — BP 122/83 | HR 80 | Temp 98.1°F | Wt 112.0 lb

## 2015-11-24 DIAGNOSIS — L282 Other prurigo: Secondary | ICD-10-CM

## 2015-11-24 DIAGNOSIS — Z298 Encounter for other specified prophylactic measures: Secondary | ICD-10-CM

## 2015-11-24 DIAGNOSIS — R110 Nausea: Secondary | ICD-10-CM

## 2015-11-24 DIAGNOSIS — B20 Human immunodeficiency virus [HIV] disease: Secondary | ICD-10-CM

## 2015-11-24 DIAGNOSIS — Z418 Encounter for other procedures for purposes other than remedying health state: Secondary | ICD-10-CM

## 2015-11-24 MED ORDER — SULFAMETHOXAZOLE-TRIMETHOPRIM 800-160 MG PO TABS: 1.0000 | ORAL_TABLET | Freq: Every day | ORAL | 5 refills | Status: DC

## 2015-11-24 MED ORDER — DOLUTEGRAVIR SODIUM 50 MG PO TABS: 50.0000 mg | ORAL_TABLET | Freq: Every day | ORAL | 5 refills | Status: DC

## 2015-11-24 MED ORDER — EMTRICITAB-RILPIVIR-TENOFOV AF 200-25-25 MG PO TABS: 1.0000 | ORAL_TABLET | Freq: Every day | ORAL | 5 refills | Status: DC

## 2015-11-24 NOTE — Progress Notes (Signed)
CC: Follow up for HIV  Interval history: Here again to [place] care.  Last seen at the end of 2016 by my partner and was doing well on [place].  She though reports being off the medication for the last 2 weeks and was previously off the medications at least one other period since last year.  She currently is off of medicaitons since she let ADAP lapse, which is now renewed and ok per her report.  She does miss doses but otherwise no new issues.    Last CD4 was 70 and viral load of 625.    Prior to Admission medications   Medication Sig Start Date End Date Taking? Authorizing Provider  dolutegravir (TIVICAY) 50 MG tablet Take 1 tablet (50 mg total) by mouth daily. 11/24/15  Yes Thayer Headings, MD  emtricitabine-rilpivir-tenofovir AF (ODEFSEY) 200-25-25 MG TABS tablet Take 1 tablet by mouth daily. 11/24/15  Yes Thayer Headings, MD  sulfamethoxazole-trimethoprim (BACTRIM DS,SEPTRA DS) 800-160 MG tablet Take 1 tablet by mouth daily. 11/24/15  Yes Thayer Headings, MD  azithromycin (ZITHROMAX) 600 MG tablet Take 2 tablets (1,200 mg total) by mouth once a week. Patient not taking: Reported on 11/24/2015 03/02/15   Michel Bickers, MD    Review of Systems Constitutional: negative for malaise and anorexia Gastrointestinal: negative for diarrhea Musculoskeletal: negative for myalgias and arthralgias All other systems reviewed and are negative   Physical Exam: CONSTITUTIONAL:in no apparent distress  Vitals:   11/24/15 1132  BP: 122/83  Pulse: 80  Temp: 98.1 F (36.7 C)   Eyes: anicteric HENT: no thrush, no cervical lymphadenopathy Respiratory: Normal respiratory effort; CTA B Cardiovascular: RRR Respiratory: CTA B; normal respiratory effort  Lab Results  Component Value Date   HIV1RNAQUANT 625 (H) 10/27/2015   HIV1RNAQUANT <20 02/26/2015   HIV1RNAQUANT 9,864 (H) 05/19/2014   No components found for: HIV1GENOTYPRPLUS No components found for: THELPERCELL  SHx: sexually active and  uses condoms

## 2015-11-24 NOTE — Assessment & Plan Note (Signed)
Previous rash has cleared up after OTC therapy and no issues now

## 2015-11-24 NOTE — Assessment & Plan Note (Signed)
Will again restart and check labs in 4 weeks.  I again counseled her on the importance of staying on medicaitons consistently in order to establish her immune system and stay health without development of resistance.

## 2015-11-24 NOTE — Assessment & Plan Note (Signed)
No current issues

## 2015-11-24 NOTE — Assessment & Plan Note (Signed)
Will take bactrim prophylaxis

## 2015-12-22 ENCOUNTER — Other Ambulatory Visit: Payer: Medicaid Other

## 2015-12-29 ENCOUNTER — Ambulatory Visit: Payer: Medicaid Other | Admitting: Internal Medicine

## 2015-12-29 ENCOUNTER — Telehealth: Payer: Self-pay | Admitting: *Deleted

## 2015-12-29 NOTE — Telephone Encounter (Signed)
Left message requesting the patient call [facility] to schedule a new appt with Dr. Linus Salmons.

## 2016-03-22 ENCOUNTER — Other Ambulatory Visit: Payer: Self-pay | Admitting: Internal Medicine

## 2016-03-22 ENCOUNTER — Encounter: Payer: Self-pay | Admitting: Internal Medicine

## 2016-03-22 ENCOUNTER — Ambulatory Visit (INDEPENDENT_AMBULATORY_CARE_PROVIDER_SITE_OTHER): Payer: Self-pay | Admitting: Internal Medicine

## 2016-03-22 VITALS — BP 134/84 | HR 87 | Temp 98.0°F | Ht 62.0 in | Wt 116.0 lb

## 2016-03-22 DIAGNOSIS — Z9189 Other specified personal risk factors, not elsewhere classified: Secondary | ICD-10-CM

## 2016-03-22 DIAGNOSIS — B20 Human immunodeficiency virus [HIV] disease: Secondary | ICD-10-CM

## 2016-03-22 DIAGNOSIS — B081 Molluscum contagiosum: Secondary | ICD-10-CM | POA: Insufficient documentation

## 2016-03-22 DIAGNOSIS — L282 Other prurigo: Secondary | ICD-10-CM

## 2016-03-22 DIAGNOSIS — Z8619 Personal history of other infectious and parasitic diseases: Secondary | ICD-10-CM | POA: Insufficient documentation

## 2016-03-22 HISTORY — DX: Molluscum contagiosum: B08.1

## 2016-03-22 NOTE — Assessment & Plan Note (Signed)
I counseled on taking bactrim and PCP prophylaxis.

## 2016-03-22 NOTE — Progress Notes (Signed)
CC: Follow up for HIV  Interval history: she is here for follow up after starting medications back in August 2017.  Unfortunately, she misses doses most weekends since starting.  She is usually not home.  She recently got a [employer] and is going to try that.  Not taking bactrim prophylaxis.  No weight loss, no diarrhea.  Has a sore on her face, itchy skin.  No other complaints.  Declines flu shot.    Prior to Admission medications   Medication Sig Start Date End Date Taking? Authorizing Provider  dolutegravir (TIVICAY) 50 MG tablet Take 1 tablet (50 mg total) by mouth daily. 11/24/15  Yes Thayer Headings, MD  emtricitabine-rilpivir-tenofovir AF (ODEFSEY) 200-25-25 MG TABS tablet Take 1 tablet by mouth daily. 11/24/15  Yes Thayer Headings, MD  sulfamethoxazole-trimethoprim (BACTRIM DS,SEPTRA DS) 800-160 MG tablet Take 1 tablet by mouth daily. Patient not taking: Reported on 03/22/2016 11/24/15   Thayer Headings, MD    Review of Systems Constitutional: negative for fatigue, malaise and anorexia Gastrointestinal: negative for diarrhea Integument/breast: positive for rash Musculoskeletal: negative for myalgias and arthralgias All other systems reviewed and are negative    Physical Exam: CONSTITUTIONAL:in no apparent distress and alert  Vitals:   03/22/16 1121  BP: 134/84  Pulse: 87  Temp: 98 F (36.7 C)   Eyes: anicteric HENT: no thrush, no cervical lymphadenopathy Respiratory: Normal respiratory effort; CTA B Cardiovascular: RRR Skin: + small lesions on face c/w mulloscum, sore on left side of face, dry, no draiange, no surrounding erythema.   Lab Results  Component Value Date   HIV1RNAQUANT 625 (H) 10/27/2015   HIV1RNAQUANT <20 02/26/2015   HIV1RNAQUANT 9,864 (H) 05/19/2014   No components found for: HIV1GENOTYPRPLUS No components found for: THELPERCELL  SH: no drug use

## 2016-03-22 NOTE — Assessment & Plan Note (Signed)
C/w eosinophilic follicultitis.

## 2016-03-22 NOTE — Assessment & Plan Note (Signed)
Her exam is most c/w this.  Will resolve if she gets her immune system up.

## 2016-03-22 NOTE — Assessment & Plan Note (Signed)
I will check genotype and integrase geno.  I suspect she will be developing resistance.

## 2016-03-23 LAB — T-HELPER CELL (CD4) - (RCID CLINIC ONLY)
CD4 % Helper T Cell: 11 % — ABNORMAL LOW (ref 33–55)
CD4 T Cell Abs: 110 /uL — ABNORMAL LOW (ref 400–2700)

## 2016-03-25 LAB — HIV-1 RNA,QN PCR W/REFLEX GENOTYPE
HIV-1 RNA, QN PCR: 1809 copies/mL — ABNORMAL HIGH (ref <20)
HIV-1 RNA, QN PCR: 3.26 {Log_copies}/mL — AB (ref <1.30)

## 2016-04-01 LAB — HIV-1 INTEGRASE GENOTYPE: Date Viral Load Collected: 1092018

## 2016-04-19 ENCOUNTER — Ambulatory Visit: Payer: Medicaid Other | Admitting: Internal Medicine

## 2016-05-24 ENCOUNTER — Other Ambulatory Visit: Payer: Self-pay | Admitting: *Deleted

## 2016-05-24 DIAGNOSIS — B20 Human immunodeficiency virus [HIV] disease: Secondary | ICD-10-CM

## 2016-05-24 MED ORDER — EMTRICITAB-RILPIVIR-TENOFOV AF 200-25-25 MG PO TABS: 1.0000 | ORAL_TABLET | Freq: Every day | ORAL | 5 refills | Status: DC

## 2016-05-24 MED ORDER — DOLUTEGRAVIR SODIUM 50 MG PO TABS: 50.0000 mg | ORAL_TABLET | Freq: Every day | ORAL | 5 refills | Status: DC

## 2016-05-24 MED ORDER — SULFAMETHOXAZOLE-TRIMETHOPRIM 800-160 MG PO TABS: 1.0000 | ORAL_TABLET | Freq: Every day | ORAL | 5 refills | Status: DC

## 2016-06-14 ENCOUNTER — Ambulatory Visit: Payer: Medicaid Other | Admitting: Internal Medicine

## 2016-07-07 ENCOUNTER — Inpatient Hospital Stay (HOSPITAL_COMMUNITY)
Admission: AD | Admit: 2016-07-07 | Discharge: 2016-07-07 | Disposition: A | Discharge status: Home / Self Care | Payer: Medicaid Other | Source: Ambulatory Visit | Attending: Obstetrics & Gynecology | Admitting: Obstetrics & Gynecology

## 2016-07-07 ENCOUNTER — Encounter (HOSPITAL_COMMUNITY): Payer: Self-pay

## 2016-07-07 DIAGNOSIS — N926 Irregular menstruation, unspecified: Secondary | ICD-10-CM

## 2016-07-07 DIAGNOSIS — B20 Human immunodeficiency virus [HIV] disease: Secondary | ICD-10-CM | POA: Insufficient documentation

## 2016-07-07 DIAGNOSIS — N3000 Acute cystitis without hematuria: Secondary | ICD-10-CM | POA: Insufficient documentation

## 2016-07-07 DIAGNOSIS — Z79899 Other long term (current) drug therapy: Secondary | ICD-10-CM | POA: Insufficient documentation

## 2016-07-07 LAB — URINALYSIS, ROUTINE W REFLEX MICROSCOPIC
BILIRUBIN URINE: NEGATIVE
GLUCOSE, UA: NEGATIVE mg/dL
KETONES UR: NEGATIVE mg/dL
NITRITE: POSITIVE — AB
PH: 6 (ref 5.0–8.0)
PROTEIN: 30 mg/dL — AB
Specific Gravity, Urine: 1.016 (ref 1.005–1.030)

## 2016-07-07 LAB — WET PREP, GENITAL
SPERM: NONE SEEN
Trich, Wet Prep: NONE SEEN
Yeast Wet Prep HPF POC: NONE SEEN

## 2016-07-07 LAB — POCT PREGNANCY, URINE: Preg Test, Ur: NEGATIVE

## 2016-07-07 MED ORDER — NITROFURANTOIN MONOHYD MACRO 100 MG PO CAPS: 100.0000 mg | ORAL_CAPSULE | Freq: Two times a day (BID) | ORAL | 0 refills | Status: DC

## 2016-07-07 NOTE — Discharge Instructions (Signed)

## 2016-07-07 NOTE — MAU Note (Signed)
Pt states that she missed her period this month. Has had negative pregnancy tests at home. Started having some spotting 2 days ago. Denies pain. LMP: 06/04/2016.

## 2016-07-07 NOTE — MAU Provider Note (Signed)
History     CSN: [id number]  Arrival date and time: 07/07/16 3614   First Provider Initiated Contact with Patient 07/07/16 2011      Chief Complaint  Patient presents with  . Vaginal Bleeding   Vaginal Bleeding  The patient's primary symptoms include vaginal bleeding. The patient's pertinent negatives include no pelvic pain. This is a new problem. Episode onset: three days ago. The problem occurs intermittently. The problem has been unchanged. The patient is experiencing no pain. She is not pregnant. Associated symptoms include nausea and vomiting ("a week ago"). Pertinent negatives include no chills, dysuria, fever, frequency or urgency. The vaginal discharge was brown. The vaginal bleeding is spotting. She has been passing clots (about the size of a pea). She has not been passing tissue. Nothing aggravates the symptoms. She has tried nothing for the symptoms. She is sexually active. No, her partner does not have an STD. She uses nothing for contraception. Her menstrual history has been regular (LMP 06/04/16 ).    Past Medical History:  Diagnosis Date  . 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 HIV (human immunodeficiency virus infection) ([place])   . Immune deficiency disorder [facility])     Past Surgical History:  Procedure Laterality Date  . APPENDECTOMY  2011  . NO PAST SURGERIES      Family History  Problem Relation Age of Onset  . Adopted: Yes  . Hypertension Mother     Social History  Substance Use Topics  . Smoking status: Never Smoker  . Smokeless tobacco: Never Used  . Alcohol use No     Comment: occasional    Allergies: No Known Allergies  Facility-Administered Medications Prior to Admission  Medication Dose Route Frequency Provider Last Rate Last Dose  . medroxyPROGESTERone (DEPO-PROVERA) injection 150 mg  150 mg Intramuscular Q90 days Lavonia Drafts, MD   150 mg at 09/08/14 1256   Prescriptions Prior to Admission  Medication Sig  Dispense Refill Last Dose  . dolutegravir (TIVICAY) 50 MG tablet Take 1 tablet (50 mg total) by mouth daily. 30 tablet 5   . emtricitabine-rilpivir-tenofovir AF (ODEFSEY) 200-25-25 MG TABS tablet Take 1 tablet by mouth daily. 30 tablet 5   . sulfamethoxazole-trimethoprim (BACTRIM DS,SEPTRA DS) 800-160 MG tablet Take 1 tablet by mouth daily. 30 tablet 5     Review of Systems  Constitutional: Negative for chills and fever.  Gastrointestinal: Positive for nausea and vomiting ("a week ago"). Rectal pain: "a week ago"  Genitourinary: Positive for vaginal bleeding. Negative for dysuria, frequency, pelvic pain and urgency.   Physical Exam   Blood pressure 137/87, pulse 86, temperature 98.5 F (36.9 C), temperature source Oral, resp. rate 16, height 5' 3.5" (1.613 m), weight 116 lb (52.6 kg), SpO2 100 %.  Physical Exam  Nursing note and vitals reviewed. Constitutional: She is oriented to person, place, and time. She appears well-developed and well-nourished. No distress.  HENT:  Head: Normocephalic.  Cardiovascular: Normal rate.   Respiratory: Effort normal.  GI: Soft. There is no tenderness. There is no rebound.  Genitourinary:  Genitourinary Comments: External genitalia normal Small amount of brown discharge noted.   Neurological: She is alert and oriented to person, place, and time.  Skin: Skin is warm and dry.  Psychiatric: She has a normal mood and affect.   Results for orders placed or performed during the hospital encounter of 07/07/16 (from the past 24 hour(s))  Urinalysis, Routine w reflex microscopic     Status: Abnormal  Collection Time: 07/07/16  7:18 PM  Result Value Ref Range   Color, Urine YELLOW YELLOW   APPearance CLOUDY (A) CLEAR   Specific Gravity, Urine 1.016 1.005 - 1.030   pH 6.0 5.0 - 8.0   Glucose, UA NEGATIVE NEGATIVE mg/dL   Hgb urine dipstick LARGE (A) NEGATIVE   Bilirubin Urine NEGATIVE NEGATIVE   Ketones, ur NEGATIVE NEGATIVE mg/dL   Protein, ur 30 (A)  NEGATIVE mg/dL   Nitrite POSITIVE (A) NEGATIVE   Leukocytes, UA LARGE (A) NEGATIVE   RBC / HPF TOO NUMEROUS TO COUNT 0 - 5 RBC/hpf   WBC, UA TOO NUMEROUS TO COUNT 0 - 5 WBC/hpf   Bacteria, UA MANY (A) NONE SEEN   Squamous Epithelial / LPF 0-5 (A) NONE SEEN   WBC Clumps PRESENT    Mucous PRESENT   Pregnancy, urine POC     Status: None   Collection Time: 07/07/16  7:30 PM  Result Value Ref Range   Preg Test, Ur NEGATIVE NEGATIVE  Wet prep, genital     Status: Abnormal   Collection Time: 07/07/16  8:23 PM  Result Value Ref Range   Yeast Wet Prep HPF POC NONE SEEN NONE SEEN   Trich, Wet Prep NONE SEEN NONE SEEN   Clue Cells Wet Prep HPF POC PRESENT (A) NONE SEEN   WBC, Wet Prep HPF POC FEW (A) NONE SEEN   Sperm NONE SEEN     MAU Course  Procedures  MDM   Assessment and Plan   1. Irregular menstrual cycle   2. Acute cystitis without hematuria    DC home Comfort measures reviewed  FU with PCP if irregular periods continue  RX: macrobid BID x 5 days #10  Return to MAU as needed FU with OB as planned  Follow-up Information    [facility] Follow up.   Contact information: [place] [address] [phone]            Mathis Bud 07/07/2016, 8:14 PM

## 2016-07-08 LAB — GC/CHLAMYDIA PROBE AMP (~~LOC~~) NOT AT ARMC
Chlamydia: NEGATIVE
Neisseria Gonorrhea: NEGATIVE

## 2016-10-26 ENCOUNTER — Inpatient Hospital Stay (HOSPITAL_COMMUNITY)
Admission: AD | Admit: 2016-10-26 | Discharge: 2016-10-26 | Disposition: A | Discharge status: Home / Self Care | Payer: Self-pay | Source: Ambulatory Visit | Attending: Family Medicine | Admitting: Family Medicine

## 2016-10-26 ENCOUNTER — Encounter (HOSPITAL_COMMUNITY): Payer: Self-pay | Admitting: *Deleted

## 2016-10-26 ENCOUNTER — Inpatient Hospital Stay (HOSPITAL_COMMUNITY): Payer: Self-pay

## 2016-10-26 DIAGNOSIS — K273 Acute peptic ulcer, site unspecified, without hemorrhage or perforation: Secondary | ICD-10-CM | POA: Insufficient documentation

## 2016-10-26 DIAGNOSIS — Z8249 Family history of ischemic heart disease and other diseases of the circulatory system: Secondary | ICD-10-CM | POA: Insufficient documentation

## 2016-10-26 DIAGNOSIS — Z9889 Other specified postprocedural states: Secondary | ICD-10-CM | POA: Insufficient documentation

## 2016-10-26 DIAGNOSIS — K279 Peptic ulcer, site unspecified, unspecified as acute or chronic, without hemorrhage or perforation: Secondary | ICD-10-CM

## 2016-10-26 DIAGNOSIS — Z3202 Encounter for pregnancy test, result negative: Secondary | ICD-10-CM | POA: Insufficient documentation

## 2016-10-26 DIAGNOSIS — B20 Human immunodeficiency virus [HIV] disease: Secondary | ICD-10-CM | POA: Insufficient documentation

## 2016-10-26 DIAGNOSIS — R1013 Epigastric pain: Secondary | ICD-10-CM

## 2016-10-26 DIAGNOSIS — N132 Hydronephrosis with renal and ureteral calculous obstruction: Secondary | ICD-10-CM | POA: Insufficient documentation

## 2016-10-26 DIAGNOSIS — R109 Unspecified abdominal pain: Secondary | ICD-10-CM

## 2016-10-26 LAB — URINALYSIS, ROUTINE W REFLEX MICROSCOPIC
BILIRUBIN URINE: NEGATIVE
Glucose, UA: NEGATIVE mg/dL
Ketones, ur: 5 mg/dL — AB
NITRITE: NEGATIVE
PH: 7 (ref 5.0–8.0)
Protein, ur: 100 mg/dL — AB
SPECIFIC GRAVITY, URINE: 1.016 (ref 1.005–1.030)

## 2016-10-26 LAB — CBC
HEMATOCRIT: 40.2 % (ref 36.0–46.0)
Hemoglobin: 13.9 g/dL (ref 12.0–15.0)
MCH: 28.5 pg (ref 26.0–34.0)
MCHC: 34.6 g/dL (ref 30.0–36.0)
MCV: 82.5 fL (ref 78.0–100.0)
Platelets: 52 10*3/uL — ABNORMAL LOW (ref 150–400)
RBC: 4.87 MIL/uL (ref 3.87–5.11)
RDW: 13 % (ref 11.5–15.5)
WBC: 4.5 10*3/uL (ref 4.0–10.5)

## 2016-10-26 LAB — COMPREHENSIVE METABOLIC PANEL
ALT: 32 U/L (ref 14–54)
AST: 31 U/L (ref 15–41)
Albumin: 4.4 g/dL (ref 3.5–5.0)
Alkaline Phosphatase: 123 U/L (ref 38–126)
Anion gap: 9 (ref 5–15)
BILIRUBIN TOTAL: 0.8 mg/dL (ref 0.3–1.2)
BUN: 10 mg/dL (ref 6–20)
CALCIUM: 9.5 mg/dL (ref 8.9–10.3)
CO2: 24 mmol/L (ref 22–32)
CREATININE: 0.9 mg/dL (ref 0.44–1.00)
Chloride: 101 mmol/L (ref 101–111)
GFR calc Af Amer: >60 mL/min (ref >60)
Glucose, Bld: 82 mg/dL (ref 65–99)
Potassium: 3.8 mmol/L (ref 3.5–5.1)
Sodium: 134 mmol/L — ABNORMAL LOW (ref 135–145)
Total Protein: 9.4 g/dL — ABNORMAL HIGH (ref 6.5–8.1)

## 2016-10-26 LAB — LIPASE, BLOOD: LIPASE: 29 U/L (ref 11–51)

## 2016-10-26 LAB — AMYLASE: AMYLASE: 106 U/L — AB (ref 28–100)

## 2016-10-26 LAB — POCT PREGNANCY, URINE: Preg Test, Ur: NEGATIVE

## 2016-10-26 MED ORDER — PANTOPRAZOLE SODIUM 20 MG PO TBEC: 20.0000 mg | DELAYED_RELEASE_TABLET | Freq: Every day | ORAL | 3 refills | Status: DC

## 2016-10-26 MED ORDER — GI COCKTAIL ~~LOC~~
30.0000 mL | Freq: Once | ORAL | Status: AC
  Administered 2016-10-26: 30 mL via ORAL
  Filled 2016-10-26: qty 30

## 2016-10-26 NOTE — Discharge Instructions (Signed)
Peptic Ulcer A peptic ulcer is a sore in the lining of the esophagus (esophageal ulcer), the stomach (gastric ulcer), or the first part of the small intestine (duodenal ulcer). The ulcer causes gradual wearing away (erosion) into the deeper tissue. What are the causes? Normally, the lining of the stomach and the small intestine protects itself from the acid that digests food. The protective lining can be damaged by:  An infection caused by a germ (bacterium) called Helicobacter pylori or H. pylori.  Regular use of NSAIDs, such as ibuprofen or aspirin.  Rare tumors in the stomach, small intestine, or pancreas (Zollinger-Ellison syndrome).  What increases the risk? The following factors may make you more likely to develop this condition:  Smoking.  Having a family history of ulcer disease.  What are the signs or symptoms? Symptoms of this condition include:  Burning pain or gnawing in the area between the chest and the belly button. The pain may be worse on an empty stomach and at night.  Heartburn.  Nausea and vomiting.  Bloating.  If the ulcer results in bleeding, it can cause:  Black, tarry stools.  Vomiting of bright red blood.  Vomiting of material that looks like coffee grounds.  How is this diagnosed? This condition may be diagnosed based on:  Medical history and physical exam.  Various tests or procedures, such as: ? Blood tests, stool tests, or breath tests to check for the H. pylori bacterium. ? An X-ray exam (upper gastrointestinal series) of the esophagus, stomach, and small intestine. ? Upper endoscopy. The health care provider examines the esophagus, stomach, and small intestine using a small flexible tube that has a video camera at the end. ? Biopsy. A tissue sample is removed to be examined under a microscope.  How is this treated? Treatment for this condition may include:  Eliminating the cause of the ulcer, such as smoking or the use of NSAIDs or  alcohol.  Medicines to reduce the amount of acid in your digestive tract.  Antibiotic medicines, if the ulcer is caused by the H. pylori bacterium.  An upper endoscopy to treat a bleeding ulcer.  Surgery, if the bleeding is severe or if the ulcer created a hole somewhere in the digestive system.  Follow these instructions at home:  Avoid alcohol and caffeine.  Do not use any tobacco products, such as cigarettes, chewing tobacco, and e-cigarettes. If you need help quitting, ask your health care provider.  Take over-the-counter and prescription medicines only as told by your health care provider. Do not use over-the-counter medicines in place of prescription medicines unless your health care provider approves.  Keep all follow-up visits as told by your health care provider. This is important. Contact a health care provider if:  Your symptoms do not improve within 7 days of starting treatment.  You have ongoing indigestion or heartburn. Get help right away if:  You have sudden, sharp, or persistent pain in your abdomen.  You have bloody or dark black, tarry stools.  You vomit blood or material that looks like coffee grounds.  You become light-headed or you feel faint.  You become weak.  You become sweaty or clammy. This information is not intended to replace advice given to you by your health care provider. Make sure you discuss any questions you have with your health care provider. Document Released: 02/26/2000 Document Revised: 08/03/2015 Document Reviewed: 11/29/2014 Elsevier Interactive Patient Education  2018 [place] Diet A bland diet consists of foods that  do not have a lot of fat or fiber. Foods without fat or fiber are easier for the body to digest. They are also less likely to irritate your mouth, throat, stomach, and other parts of your gastrointestinal tract. A bland diet is sometimes called a BRAT diet. What is my plan? Your health care provider or  dietitian may recommend specific changes to your diet to prevent and treat your symptoms, such as:  Eating small meals often.  Cooking food until it is soft enough to chew easily.  Chewing your food well.  Drinking fluids slowly.  Not eating foods that are very spicy, sour, or fatty.  Not eating citrus fruits, such as oranges and grapefruit.  What do I need to know about this diet?  Eat a variety of foods from the bland diet food list.  Do not follow a bland diet longer than you have to.  Ask your health care provider whether you should take vitamins. What foods can I eat? Grains  Hot cereals, such as cream of wheat. Bread, crackers, or tortillas made from refined white flour. Rice. Vegetables Canned or cooked vegetables. Mashed or boiled potatoes. Fruits Bananas. Applesauce. Other types of cooked or canned fruit with the skin and seeds removed, such as canned peaches or pears. Meats and Other Protein Sources Scrambled eggs. Creamy peanut butter or other nut butters. Lean, well-cooked meats, such as chicken or fish. Tofu. Soups or broths. Dairy Low-fat dairy products, such as milk, cottage cheese, or yogurt. Beverages Water. Herbal tea. Apple juice. Sweets and Desserts Pudding. Custard. Fruit gelatin. Ice cream. Fats and Oils Mild salad dressings. [place] or olive oil. The items listed above may not be a complete list of allowed foods or beverages. Contact your dietitian for more options. What foods are not recommended? Foods and ingredients that are often not recommended include:  Spicy foods, such as hot sauce or salsa.  Fried foods.  Sour foods, such as pickled or fermented foods.  Raw vegetables or fruits, especially citrus or berries.  Caffeinated drinks.  Alcohol.  Strongly flavored seasonings or condiments.  The items listed above may not be a complete list of foods and beverages that are not allowed. Contact your dietitian for more information. This  information is not intended to replace advice given to you by your health care provider. Make sure you discuss any questions you have with your health care provider. Document Released: 06/22/2015 Document Revised: 08/06/2015 Document Reviewed: 03/12/2014 Elsevier Interactive Patient Education  2018 [place].   Flank Pain, Adult Flank pain is pain that is located on the side of the body between the upper abdomen and the back. This area is called the flank. The pain may occur over a short period of time (acute), or it may be long-term or recurring (chronic). It may be mild or severe. Flank pain can be caused by many things, including:  Muscle soreness or injury.  Kidney stones or kidney disease.  Stress.  A disease of the spine (vertebral disk disease).  A lung infection (pneumonia).  Fluid around the lungs (pulmonary edema).  A skin rash caused by the chickenpox virus (shingles).  Tumors that affect the back of the abdomen.  Gallbladder disease.  Follow these instructions at home:  Drink enough fluid to keep your urine clear or pale yellow.  Rest as told by your health care provider.  Take over-the-counter and prescription medicines only as told by your health care provider.  Keep a journal to track what has  caused your flank pain and what has made it feel better.  Keep all follow-up visits as told by your health care provider. This is important. Contact a health care provider if:  Your pain is not controlled with medicine.  You have new symptoms.  Your pain gets worse.  You have a fever.  Your symptoms last longer than 2-3 days.  You have trouble urinating or you are urinating very frequently. Get help right away if:  You have trouble breathing or you are short of breath.  Your abdomen hurts or it is swollen or red.  You have nausea or vomiting.  You feel faint or you pass out.  You have blood in your urine. Summary  Flank pain is pain that is  located on the side of the body between the upper abdomen and the back.  The pain may occur over a short period of time (acute), or it may be long-term or recurring (chronic). It may be mild or severe.  Flank pain can be caused by many things.  Contact your health care provider if your symptoms get worse or they last longer than 2-3 days. This information is not intended to replace advice given to you by your health care provider. Make sure you discuss any questions you have with your health care provider. Document Released: 04/21/2005 Document Revised: 05/13/2016 Document Reviewed: 05/13/2016 Elsevier Interactive Patient Education  2018 [employer].   Dietary Guidelines to Help Prevent Kidney Stones Kidney stones are deposits of minerals and salts that form inside your kidneys. Your risk of developing kidney stones may be greater depending on your diet, your lifestyle, the medicines you take, and whether you have certain medical conditions. Most people can reduce their chances of developing kidney stones by following the instructions below. Depending on your overall health and the type of kidney stones you tend to develop, your dietitian may give you more specific instructions. What are tips for following this plan? Reading food labels  Choose foods with "no salt added" or "low-salt" labels. Limit your sodium intake to less than 1500 mg per day.  Choose foods with calcium for each meal and snack. Try to eat about 300 mg of calcium at each meal. Foods that contain 200-500 mg of calcium per serving include: ? 8 oz (237 ml) of milk, fortified nondairy milk, and fortified fruit juice. ? 8 oz (237 ml) of kefir, yogurt, and soy yogurt. ? 4 oz (118 ml) of tofu. ? 1 oz of cheese. ? 1 cup (300 g) of dried figs. ? 1 cup (91 g) of cooked broccoli. ? 1-3 oz can of sardines or mackerel.  Most people need 1000 to 1500 mg of calcium each day. Talk to your dietitian about how much calcium is  recommended for you. Shopping  Buy plenty of fresh fruits and vegetables. Most people do not need to avoid fruits and vegetables, even if they contain nutrients that may contribute to kidney stones.  When shopping for convenience foods, choose: ? Whole pieces of fruit. ? Premade salads with dressing on the side. ? Low-fat fruit and yogurt smoothies.  Avoid buying frozen meals or prepared deli foods.  Look for foods with live cultures, such as yogurt and kefir. Cooking  Do not add salt to food when cooking. Place a salt shaker on the table and allow each person to add his or her own salt to taste.  Use vegetable protein, such as beans, textured vegetable protein (TVP), or tofu instead of meat  in pasta, casseroles, and soups. Meal planning  Eat less salt, if told by your dietitian. To do this: ? Avoid eating processed or premade food. ? Avoid eating fast food.  Eat less animal protein, including cheese, meat, poultry, or fish, if told by your dietitian. To do this: ? Limit the number of times you have meat, poultry, fish, or cheese each week. Eat a diet free of meat at least 2 days a week. ? Eat only one serving each day of meat, poultry, fish, or seafood. ? When you prepare animal protein, cut pieces into small portion sizes. For most meat and fish, one serving is about the size of one deck of cards.  Eat at least 5 servings of fresh fruits and vegetables each day. To do this: ? Keep fruits and vegetables on hand for snacks. ? Eat 1 piece of fruit or a handful of berries with breakfast. ? Have a salad and fruit at lunch. ? Have two kinds of vegetables at dinner.  Limit foods that are high in a substance called oxalate. These include: ? Spinach. ? Rhubarb. ? Beets. ? Potato chips and french fries. ? Nuts.  If you regularly take a diuretic medicine, make sure to eat at least 1-2 fruits or vegetables high in potassium each day. These include: ? Avocado. ? Banana. ? Orange,  prune, carrot, or tomato juice. ? Baked potato. ? Cabbage. ? Beans and split peas. General instructions  Drink enough fluid to keep your urine clear or pale yellow. This is the most important thing you can do.  Talk to your health care provider and dietitian about taking daily supplements. Depending on your health and the cause of your kidney stones, you may be advised: ? Not to take supplements with vitamin C. ? To take a calcium supplement. ? To take a daily probiotic supplement. ? To take other supplements such as magnesium, fish oil, or vitamin B6.  Take all medicines and supplements as told by your health care provider.  Limit alcohol intake to no more than 1 drink a day for nonpregnant women and 2 drinks a day for men. One drink equals 12 oz of beer, 5 oz of wine, or 1 oz of hard liquor.  Lose weight if told by your health care provider. Work with your dietitian to find strategies and an eating plan that works best for you. What foods are not recommended? Limit your intake of the following foods, or as told by your dietitian. Talk to your dietitian about specific foods you should avoid based on the type of kidney stones and your overall health. Grains Breads. Bagels. Rolls. Baked goods. Salted crackers. Cereal. Pasta. Vegetables Spinach. Rhubarb. Beets. Canned vegetables. Angie Fava. Olives. Meats and other protein foods Nuts. Nut butters. Large portions of meat, poultry, or fish. Salted or cured meats. Deli meats. Hot dogs. Sausages. Dairy Cheese. Beverages Regular soft drinks. Regular vegetable juice. Seasonings and other foods Seasoning blends with salt. Salad dressings. Canned soups. Soy sauce. Ketchup. Barbecue sauce. Canned pasta sauce. Casseroles. Pizza. Lasagna. Frozen meals. Potato chips. [place] fries. Summary  You can reduce your risk of kidney stones by making changes to your diet.  The most important thing you can do is drink enough fluid. You should drink enough  fluid to keep your urine clear or pale yellow.  Ask your health care provider or dietitian how much protein from animal sources you should eat each day, and also how much salt and calcium you should have each  day. This information is not intended to replace advice given to you by your health care provider. Make sure you discuss any questions you have with your health care provider. Document Released: 06/25/2010 Document Revised: 02/09/2016 Document Reviewed: 02/09/2016 Elsevier Interactive Patient Education  2017 [employer].

## 2016-10-26 NOTE — MAU Provider Note (Signed)
History     CSN: [id number]  Arrival date and time: 10/26/16 1111   None     Chief Complaint  Patient presents with  . Abdominal Pain  . Back Pain  . Emesis   HPI 26 yo Bishop here for the evaluation of nausea/vomiting and abdominal pain. Patient reports onset of symptoms since 3 days ago. She denies any sick contact or ingesting bad food. She is the only one experiencing these symptoms in her household. She reports 3-4 episodes of emesis per day. She denies diarrhea. She also reports abdominal pain, gnawing in nature localized in her epigastric region. The pain seems to improve following emesis. She is able to tolerate some foods    Past Medical History:  Diagnosis Date  . 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 HIV (human immunodeficiency virus infection) ([place])   . Immune deficiency disorder [facility])     Past Surgical History:  Procedure Laterality Date  . APPENDECTOMY  2011  . NO PAST SURGERIES      Family History  Problem Relation Age of Onset  . Adopted: Yes  . Hypertension Mother     Social History  Substance Use Topics  . Smoking status: Never Smoker  . Smokeless tobacco: Never Used  . Alcohol use No     Comment: occasional    Allergies: No Known Allergies  Facility-Administered Medications Prior to Admission  Medication Dose Route Frequency Provider Last Rate Last Dose  . medroxyPROGESTERone (DEPO-PROVERA) injection 150 mg  150 mg Intramuscular Q90 days Lavonia Drafts, MD   150 mg at 09/08/14 1256   Prescriptions Prior to Admission  Medication Sig Dispense Refill Last Dose  . dolutegravir (TIVICAY) 50 MG tablet Take 1 tablet (50 mg total) by mouth daily. 30 tablet 5 10/25/2016 at Unknown time  . emtricitabine-rilpivir-tenofovir AF (ODEFSEY) 200-25-25 MG TABS tablet Take 1 tablet by mouth daily. 30 tablet 5 10/25/2016 at Unknown time  . nitrofurantoin, macrocrystal-monohydrate, (MACROBID) 100 MG capsule Take 1 capsule (100  mg total) by mouth 2 (two) times daily. (Patient not taking: Reported on 10/26/2016) 10 capsule 0 Not Taking at Unknown time    Review of Systems  See pertinent in HPI Physical Exam   Blood pressure 123/88, pulse (!) 109, temperature 98.6 F (37 C), temperature source Oral, resp. rate 16, weight 114 lb 12 oz (52.1 kg), last menstrual period 10/01/2016, SpO2 100 %.  Physical Exam GENERAL: Well-developed, well-nourished female in no acute distress.  ABDOMEN: Soft, nontender, nondistended. No organomegaly. PELVIC: Not indicated EXTREMITIES: No cyanosis, clubbing, or edema, 2+ distal pulses.  MAU Course  Procedures  MDM Epigastric pain improved following GI cocktail  Results for orders placed or performed during the hospital encounter of 10/26/16 (from the past 24 hour(s))  Urinalysis, Routine w reflex microscopic     Status: Abnormal   Collection Time: 10/26/16 11:38 AM  Result Value Ref Range   Color, Urine AMBER (A) YELLOW   APPearance CLOUDY (A) CLEAR   Specific Gravity, Urine 1.016 1.005 - 1.030   pH 7.0 5.0 - 8.0   Glucose, UA NEGATIVE NEGATIVE mg/dL   Hgb urine dipstick SMALL (A) NEGATIVE   Bilirubin Urine NEGATIVE NEGATIVE   Ketones, ur 5 (A) NEGATIVE mg/dL   Protein, ur 100 (A) NEGATIVE mg/dL   Nitrite NEGATIVE NEGATIVE   Leukocytes, UA MODERATE (A) NEGATIVE   RBC / HPF 6-30 0 - 5 RBC/hpf   WBC, UA TOO NUMEROUS TO COUNT 0 - 5 WBC/hpf  Bacteria, UA MANY (A) NONE SEEN   Squamous Epithelial / LPF 0-5 (A) NONE SEEN   WBC Clumps PRESENT    Mucous PRESENT    Triple Phosphate Crystal PRESENT   Pregnancy, urine POC     Status: None   Collection Time: 10/26/16 11:52 AM  Result Value Ref Range   Preg Test, Ur NEGATIVE NEGATIVE  CBC     Status: Abnormal   Collection Time: 10/26/16  1:15 PM  Result Value Ref Range   WBC 4.5 4.0 - 10.5 K/uL   RBC 4.87 3.87 - 5.11 MIL/uL   Hemoglobin 13.9 12.0 - 15.0 g/dL   HCT 40.2 36.0 - 46.0 %   MCV 82.5 78.0 - 100.0 fL   MCH 28.5  26.0 - 34.0 pg   MCHC 34.6 30.0 - 36.0 g/dL   RDW 13.0 11.5 - 15.5 %   Platelets 52 (L) 150 - 400 K/uL  Comprehensive metabolic panel     Status: Abnormal   Collection Time: 10/26/16  1:15 PM  Result Value Ref Range   Sodium 134 (L) 135 - 145 mmol/L   Potassium 3.8 3.5 - 5.1 mmol/L   Chloride 101 101 - 111 mmol/L   CO2 24 22 - 32 mmol/L   Glucose, Bld 82 65 - 99 mg/dL   BUN 10 6 - 20 mg/dL   Creatinine, Ser 0.90 0.44 - 1.00 mg/dL   Calcium 9.5 8.9 - 10.3 mg/dL   Total Protein 9.4 (H) 6.5 - 8.1 g/dL   Albumin 4.4 3.5 - 5.0 g/dL   AST 31 15 - 41 U/L   ALT 32 14 - 54 U/L   Alkaline Phosphatase 123 38 - 126 U/L   Total Bilirubin 0.8 0.3 - 1.2 mg/dL   GFR calc non Af Amer >Kimberly >Kimberly mL/min   GFR calc Af Amer >Kimberly >Kimberly mL/min   Anion gap 9 5 - 15  Amylase     Status: Abnormal   Collection Time: 10/26/16  1:15 PM  Result Value Ref Range   Amylase 106 (H) 28 - 100 U/L  Lipase, blood     Status: None   Collection Time: 10/26/16  1:15 PM  Result Value Ref Range   Lipase 29 11 - 51 U/L   US Abdomen Limited Ruq  Result Date: 10/26/2016 CLINICAL DATA:  Right upper quadrant abdominal and back pain for 4 days with nausea and vomiting. EXAM: ULTRASOUND ABDOMEN LIMITED RIGHT UPPER QUADRANT COMPARISON:  01/13/2009 CT abdomen/pelvis. FINDINGS: Gallbladder: No gallstones or wall thickening visualized. No sonographic Murphy sign noted by sonographer. Common bile duct: Diameter: 3 mm Liver: No focal lesion identified. Within normal limits in parenchymal echogenicity. Incidentally noted is mild to moderate right hydronephrosis with multiple shadowing mid to lower right renal stones, with the largest visualized right renal stone measuring 8 mm. Echogenic right kidney. IMPRESSION: 1. Incidental mild-to-moderate right hydronephrosis with right nephrolithiasis. Findings are suggestive of an obstructing right urinary tract stone. Consider correlation with unenhanced CT abdomen/pelvis as clinically warranted. 2.  Right kidney is echogenic indicating nonspecific right renal parenchymal disease, probably due to acute right urinary tract obstruction. 3. Otherwise normal right upper quadrant abdominal sonogram, with no cholelithiasis. Electronically Signed   By: Ilona Sorrel M.D.   On: 10/26/2016 15:01     Assessment and Plan  26 yo with peptic ulcer disease - Reassurance provided - Rx protonix provided - Precautions and dietary changes provided - patient also noted to have right obstructive kidney stone. Patient asymptomatic.  Will have her follow up with PCP and urology for further management - Follow up with PCP or RTC prn  Keyerra Lamere 10/26/2016, 3:19 PM

## 2016-10-26 NOTE — Progress Notes (Addendum)
Non-pregnant pt here for pain in the upper and back area of stomach. States throwing up that started on Sunday. HPT negative and had normal period on 21 July.   1320: lab at [facility] and stuck pt twice and could not obtain.  1321: blood drawn for the lab tech. Butterfly needle used  1325: Dr. Barnett Abu at [facility].

## 2016-10-26 NOTE — MAU Note (Signed)
Having pain in upper abd and back.  Keeps throwing up.  Started on Sunday. neg HPT this morning. Period isn't late

## 2016-10-28 ENCOUNTER — Telehealth: Payer: Self-pay | Admitting: *Deleted

## 2016-10-28 NOTE — Telephone Encounter (Signed)
Patient called stating she was seen at the ED and dx with a kidney stone and given an appt for 11/03/16 with a urologist. She wanted a sooner appt and I called [facility] and the resident clinic only sees uninsured patients on Thursday, so this is the [place] patient if she worsens she will need to return to the ED. Myrtis Hopping

## 2016-11-03 ENCOUNTER — Inpatient Hospital Stay (HOSPITAL_COMMUNITY): Payer: Self-pay | Admitting: Certified Registered Nurse Anesthetist

## 2016-11-03 ENCOUNTER — Inpatient Hospital Stay (HOSPITAL_COMMUNITY)
Admission: AD | Admit: 2016-11-03 | Discharge: 2016-11-10 | DRG: 975 | Disposition: A | Discharge status: Home / Self Care | Payer: Self-pay | Source: Ambulatory Visit | Attending: Urology | Admitting: Urology

## 2016-11-03 ENCOUNTER — Inpatient Hospital Stay (HOSPITAL_COMMUNITY): Payer: Self-pay

## 2016-11-03 ENCOUNTER — Encounter (HOSPITAL_COMMUNITY)
Admission: AD | Disposition: A | Discharge status: Home / Self Care | Payer: Self-pay | Source: Ambulatory Visit | Attending: Urology

## 2016-11-03 ENCOUNTER — Other Ambulatory Visit: Payer: Self-pay | Admitting: Urology

## 2016-11-03 ENCOUNTER — Encounter (HOSPITAL_COMMUNITY): Payer: Self-pay | Admitting: *Deleted

## 2016-11-03 DIAGNOSIS — A419 Sepsis, unspecified organism: Principal | ICD-10-CM | POA: Diagnosis present

## 2016-11-03 DIAGNOSIS — Z79899 Other long term (current) drug therapy: Secondary | ICD-10-CM

## 2016-11-03 DIAGNOSIS — L409 Psoriasis, unspecified: Secondary | ICD-10-CM | POA: Diagnosis present

## 2016-11-03 DIAGNOSIS — Z9114 Patient's other noncompliance with medication regimen: Secondary | ICD-10-CM

## 2016-11-03 DIAGNOSIS — N1330 Unspecified hydronephrosis: Secondary | ICD-10-CM

## 2016-11-03 DIAGNOSIS — B20 Human immunodeficiency virus [HIV] disease: Secondary | ICD-10-CM | POA: Diagnosis present

## 2016-11-03 DIAGNOSIS — K279 Peptic ulcer, site unspecified, unspecified as acute or chronic, without hemorrhage or perforation: Secondary | ICD-10-CM | POA: Diagnosis present

## 2016-11-03 DIAGNOSIS — N136 Pyonephrosis: Secondary | ICD-10-CM | POA: Diagnosis present

## 2016-11-03 HISTORY — PX: CYSTOSCOPY W/ URETERAL STENT PLACEMENT: SHX1429

## 2016-11-03 LAB — COMPREHENSIVE METABOLIC PANEL
ALT: 25 U/L (ref 14–54)
AST: 36 U/L (ref 15–41)
Albumin: 3.9 g/dL (ref 3.5–5.0)
Alkaline Phosphatase: 97 U/L (ref 38–126)
Anion gap: 14 (ref 5–15)
BUN: 15 mg/dL (ref 6–20)
CO2: 23 mmol/L (ref 22–32)
Calcium: 9.2 mg/dL (ref 8.9–10.3)
Chloride: 98 mmol/L — ABNORMAL LOW (ref 101–111)
Creatinine, Ser: 1.05 mg/dL — ABNORMAL HIGH (ref 0.44–1.00)
GFR calc Af Amer: >60 mL/min (ref >60)
GFR calc non Af Amer: >60 mL/min (ref >60)
Glucose, Bld: 91 mg/dL (ref 65–99)
Potassium: 4.1 mmol/L (ref 3.5–5.1)
Sodium: 135 mmol/L (ref 135–145)
Total Bilirubin: 0.9 mg/dL (ref 0.3–1.2)
Total Protein: 9.6 g/dL — ABNORMAL HIGH (ref 6.5–8.1)

## 2016-11-03 LAB — CBC
HCT: 37.2 % (ref 36.0–46.0)
Hemoglobin: 13 g/dL (ref 12.0–15.0)
MCH: 28.6 pg (ref 26.0–34.0)
MCHC: 34.9 g/dL (ref 30.0–36.0)
MCV: 81.8 fL (ref 78.0–100.0)
Platelets: 181 10*3/uL (ref 150–400)
RBC: 4.55 MIL/uL (ref 3.87–5.11)
RDW: 12.6 % (ref 11.5–15.5)
WBC: 8 10*3/uL (ref 4.0–10.5)

## 2016-11-03 LAB — PREGNANCY, URINE: Preg Test, Ur: NEGATIVE

## 2016-11-03 SURGERY — CYSTOSCOPY, WITH RETROGRADE PYELOGRAM AND URETERAL STENT INSERTION
Anesthesia: General | Site: Ureter | Laterality: Right

## 2016-11-03 MED ORDER — SCOPOLAMINE 1 MG/3DAYS TD PT72
MEDICATED_PATCH | TRANSDERMAL | Status: DC | PRN
  Administered 2016-11-03: 1 via TRANSDERMAL

## 2016-11-03 MED ORDER — FENTANYL CITRATE (PF) 100 MCG/2ML IJ SOLN
INTRAMUSCULAR | Status: DC | PRN
  Administered 2016-11-03 (×2): 25 ug via INTRAVENOUS

## 2016-11-03 MED ORDER — FENTANYL CITRATE (PF) 100 MCG/2ML IJ SOLN
INTRAMUSCULAR | Status: AC
  Filled 2016-11-03: qty 2

## 2016-11-03 MED ORDER — BELLADONNA-OPIUM 16.2-30 MG RE SUPP
RECTAL | Status: AC
  Filled 2016-11-03: qty 1

## 2016-11-03 MED ORDER — PROMETHAZINE HCL 25 MG/ML IJ SOLN: 6.2500 mg | INTRAMUSCULAR | Status: DC | PRN

## 2016-11-03 MED ORDER — MEPERIDINE HCL 50 MG/ML IJ SOLN: 6.2500 mg | INTRAMUSCULAR | Status: DC | PRN

## 2016-11-03 MED ORDER — DEXTROSE 5 % IV SOLN
2.0000 g | INTRAVENOUS | Status: AC
  Administered 2016-11-03: 2 g via INTRAVENOUS
  Filled 2016-11-03: qty 2

## 2016-11-03 MED ORDER — ONDANSETRON HCL 4 MG/2ML IJ SOLN
INTRAMUSCULAR | Status: AC
  Filled 2016-11-03: qty 2

## 2016-11-03 MED ORDER — ONDANSETRON HCL 4 MG/2ML IJ SOLN
INTRAMUSCULAR | Status: DC | PRN
  Administered 2016-11-03: 4 mg via INTRAVENOUS

## 2016-11-03 MED ORDER — LIDOCAINE 2% (20 MG/ML) 5 ML SYRINGE
INTRAMUSCULAR | Status: DC | PRN
  Administered 2016-11-03: 50 mg via INTRAVENOUS

## 2016-11-03 MED ORDER — HYDROMORPHONE HCL-NACL 0.5-0.9 MG/ML-% IV SOSY: 0.2500 mg | PREFILLED_SYRINGE | INTRAVENOUS | Status: DC | PRN

## 2016-11-03 MED ORDER — BELLADONNA-OPIUM 16.2-30 MG RE SUPP
1.0000 | Freq: Every day | RECTAL | Status: DC
  Administered 2016-11-03: 1 via RECTAL

## 2016-11-03 MED ORDER — SUCCINYLCHOLINE CHLORIDE 200 MG/10ML IV SOSY
PREFILLED_SYRINGE | INTRAVENOUS | Status: DC | PRN
  Administered 2016-11-03: 75 mg via INTRAVENOUS

## 2016-11-03 MED ORDER — MIDAZOLAM HCL 5 MG/5ML IJ SOLN
INTRAMUSCULAR | Status: DC | PRN
  Administered 2016-11-03: 2 mg via INTRAVENOUS

## 2016-11-03 MED ORDER — SCOPOLAMINE 1 MG/3DAYS TD PT72
MEDICATED_PATCH | TRANSDERMAL | Status: AC
  Filled 2016-11-03: qty 1

## 2016-11-03 MED ORDER — MIDAZOLAM HCL 2 MG/2ML IJ SOLN
INTRAMUSCULAR | Status: AC
  Filled 2016-11-03: qty 2

## 2016-11-03 MED ORDER — IOHEXOL 300 MG/ML  SOLN
INTRAMUSCULAR | Status: DC | PRN
  Administered 2016-11-03: 50 mL

## 2016-11-03 MED ORDER — LACTATED RINGERS IV SOLN
INTRAVENOUS | Status: DC
  Administered 2016-11-03 (×2): via INTRAVENOUS

## 2016-11-03 MED ORDER — PROPOFOL 10 MG/ML IV BOLUS
INTRAVENOUS | Status: AC
  Filled 2016-11-03: qty 20

## 2016-11-03 MED ORDER — DEXTROSE 5 % IV SOLN
2.0000 g | INTRAVENOUS | Status: DC
  Administered 2016-11-04 – 2016-11-07 (×4): 2 g via INTRAVENOUS
  Filled 2016-11-03 (×5): qty 2

## 2016-11-03 MED ORDER — HYDROMORPHONE HCL 1 MG/ML IJ SOLN
0.5000 mg | INTRAMUSCULAR | Status: DC | PRN
Start: 2016-11-03 — End: 2016-11-03
  Administered 2016-11-03: 0.7 mg via INTRAVENOUS
  Filled 2016-11-03: qty 1

## 2016-11-03 MED ORDER — METOCLOPRAMIDE HCL 5 MG/ML IJ SOLN
INTRAMUSCULAR | Status: DC | PRN
  Administered 2016-11-03: 5 mg via INTRAVENOUS

## 2016-11-03 MED ORDER — 0.9 % SODIUM CHLORIDE (POUR BTL) OPTIME
TOPICAL | Status: DC | PRN
  Administered 2016-11-03: 1000 mL

## 2016-11-03 MED ORDER — PROPOFOL 10 MG/ML IV BOLUS
INTRAVENOUS | Status: DC | PRN
  Administered 2016-11-03: 80 mg via INTRAVENOUS
  Administered 2016-11-03: 40 mg via INTRAVENOUS

## 2016-11-03 MED ORDER — SODIUM CHLORIDE 0.9 % IR SOLN
Status: DC | PRN
  Administered 2016-11-03: 3000 mL

## 2016-11-03 SURGICAL SUPPLY — 16 items
BAG URINE DRAINAGE (UROLOGICAL SUPPLIES) ×2 IMPLANT
BAG URO CATCHER STRL LF (MISCELLANEOUS) ×2 IMPLANT
CATH FOLEY 2WAY 5CC 16FR (CATHETERS) ×1
CATH INTERMIT  6FR 70CM (CATHETERS) ×2 IMPLANT
CATH URTH STD 16FR FL 2W DRN (CATHETERS) ×1 IMPLANT
CLOTH BEACON ORANGE TIMEOUT ST (SAFETY) ×2 IMPLANT
COVER FOOT SWITCH UNIV DISP (DRAPES) IMPLANT
COVER SURGICAL LIGHT HANDLE (MISCELLANEOUS) ×2 IMPLANT
GLOVE BIOGEL M 8.0 STRL (GLOVE) ×2 IMPLANT
GOWN STRL REUS W/ TWL XL LVL3 (GOWN DISPOSABLE) ×1 IMPLANT
GOWN STRL REUS W/TWL XL LVL3 (GOWN DISPOSABLE) ×1
GUIDEWIRE STR DUAL SENSOR (WIRE) ×2 IMPLANT
MANIFOLD NEPTUNE II (INSTRUMENTS) ×2 IMPLANT
PACK CYSTO (CUSTOM PROCEDURE TRAY) ×2 IMPLANT
STENT URET 6FRX24 CONTOUR (STENTS) ×2 IMPLANT
TUBING CONNECTING 10 (TUBING) ×2 IMPLANT

## 2016-11-03 NOTE — Op Note (Signed)
PATIENT:  Danielys C Luepke  PRE-OPERATIVE DIAGNOSIS: 1. Right hydronephrosis. 2. UTI with suspected early sepsis. 3. Right renal calculi  POST-OPERATIVE DIAGNOSIS: Same  PROCEDURE: 1. Cystoscopy with right retrograde pyelogram including interpretation. 2. Right ureteral catheterization. 3. 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 She has been experiencing right flank pain with nausea and vomiting. Urinalysis today appeared grossly infected. She's been having fever. She is suspected to have early sepsis. A renal ultrasound previously of the right upper quadrant revealed right hydronephrosis but no other pathology. She therefore is brought to the operating room today for right double-J stent placement.  ANESTHESIA:  General  EBL:  Minimal  DRAINS: 6 [place], 24 double-J stent in the right ureter. (No string)  LOCAL MEDICATIONS USED:  None  SPECIMEN:  None   Description of procedure: After informed consent the patient was taken to the operating room and placed on the table in a supine position. General anesthesia was then administered. Once fully anesthetized the patient was moved to the dorsal lithotomy position and the genitalia were sterilely prepped and draped in standard fashion. An official timeout was then performed.  The 23 French rigid cystoscope with 30 lens was then passed through the urethra which was noted be normal. The bladder was then entered and noted be mildly hyperemic throughout. There was cloudy-appearing urine. The ureteral orifices were noted to be of normal configuration and position. There were no tumors, stones or other worrisome lesions.  I passed a 6 [place] open-ended ureteral catheter through the cystoscope and into the right ureter were where I then passed a 0.038 inch floppy tip sensor guidewire up the ureter and into the area of  the renal pelvis. Over the guidewire I passed the open-ended catheter into the area the renal pelvis and then remove the guidewire in hopes of collecting urine from the hydronephrotic system but was unable to obtain any urine whatsoever. Manipulation of the open-ended catheter did not produce any urine for culture. I therefore withdrew the open-ended ureteral catheter down to the ureterovesical junction and then performed a right retrograde pyelogram.  The right retrograde pyelogram was performed by injecting full-strength Omnipaque contrast of the open-ended catheter and up the right ureter under direct fluoroscopy. This revealed no filling defects and there was really minimal hydronephrosis with just some mild dilatation of the ureter. I could see stones in the renal pelvis as filling defects. There was some mild fullness of the calyces but they were not blunted or particularly hydronephrotic appearing.  I therefore reinserted the guidewire and removed the open ended catheter. Over the guidewire I passed the double-J stent then positioned this into an upper pole calyx. There was good curl noted in this location and in the bladder as a remove the guidewire. The bladder was then drained and a 73 French Foley catheter was inserted to maximize drainage of the system. The catheter was connected to closed system drainage and the patient was awakened and taken to the recovery room in stable and satisfactory condition. She tolerated the procedure well no intraoperative complications.    PLAN OF CARE: Discharge to home after PACU  PATIENT DISPOSITION:  PACU - hemodynamically stable.

## 2016-11-03 NOTE — Transfer of Care (Signed)
Immediate Anesthesia Transfer of Care Note  Patient: Kimberly Bishop  Procedure(s) Performed: Procedure(s): CYSTOSCOPY WITH RETROGRADE PYELOGRAM/URETERAL STENT PLACEMENT (Right)  Patient Location: PACU  Anesthesia Type:General  Level of Consciousness:  sedated, patient cooperative and responds to stimulation  Airway & Oxygen Therapy:Patient Spontanous Breathing and Patient connected to face mask oxgen  Post-op Assessment:  Report given to PACU RN and Post -op Vital signs reviewed and stable  Post vital signs:  Reviewed and stable  Last Vitals:  Vitals:   11/03/16 1629 11/03/16 1954  BP: 125/79   Pulse: (!) 129   Resp: 16   Temp: 37.3 C (P) 37.2 C  SpO2: 15%     Complications: No apparent anesthesia complications

## 2016-11-03 NOTE — Anesthesia Postprocedure Evaluation (Signed)
Anesthesia Post Note  Patient: [name]  Procedure(s) Performed: Procedure(s) (LRB): CYSTOSCOPY WITH RETROGRADE PYELOGRAM/URETERAL STENT PLACEMENT (Right)     Patient location during evaluation: PACU Anesthesia Type: General Level of consciousness: awake and alert and oriented Pain management: pain level controlled Vital Signs Assessment: post-procedure vital signs reviewed and stable Respiratory status: spontaneous breathing, nonlabored ventilation and respiratory function stable Cardiovascular status: blood pressure returned to baseline and stable Postop Assessment: no signs of nausea or vomiting Anesthetic complications: no    Last Vitals:  Vitals:   11/03/16 2030 11/03/16 2045  BP: 120/83 125/79  Pulse: (!) 106 (!) 110  Resp: (!) 21 18  Temp:    SpO2: 97% 98%    Last Pain:  Vitals:   11/03/16 1817  TempSrc:   PainSc: 0-No pain                 Salahuddin Arismendez A.

## 2016-11-03 NOTE — Discharge Instructions (Addendum)
Moderate Conscious Sedation, Adult, Care After These instructions provide you with information about caring for yourself after your procedure. Your health care provider may also give you more specific instructions. Your treatment has been planned according to current medical practices, but problems sometimes occur. Call your health care provider if you have any problems or questions after your procedure. What can I expect after the procedure? After your procedure, it is common:  To feel sleepy for several hours.  To feel clumsy and have poor balance for several hours.  To have poor judgment for several hours.  To vomit if you eat too soon.  Follow these instructions at home: For at least 24 hours after the procedure:   Do not: ? Participate in activities where you could fall or become injured. ? Drive. ? Use heavy machinery. ? Drink alcohol. ? Take sleeping pills or medicines that cause drowsiness. ? Make important decisions or sign legal documents. ? Take care of children on your own.  Rest. Eating and drinking  Follow the diet recommended by your health care provider.  If you vomit: ? Drink water, juice, or soup when you can drink without vomiting. ? Make sure you have little or no nausea before eating solid foods. General instructions  Have a responsible adult stay with you until you are awake and alert.  Take over-the-counter and prescription medicines only as told by your health care provider.  If you smoke, do not smoke without supervision.  Keep all follow-up visits as told by your health care provider. This is important. Contact a health care provider if:  You keep feeling nauseous or you keep vomiting.  You feel light-headed.  You develop a rash.  You have a fever. Get help right away if:  You have trouble breathing. This information is not intended to replace advice given to you by your health care provider. Make sure you discuss any questions you have  with your health care provider. Document Released: 12/19/2012 Document Revised: 08/03/2015 Document Reviewed: 06/20/2015 Elsevier Interactive Patient Education  2018 [place]. Percutaneous Nephrostomy, Care After This sheet gives you information about how to care for yourself after your procedure. Your health care provider may also give you more specific instructions. If you have problems or questions, contact your health care provider. What can I expect after the procedure? After the procedure, it is common to have:  Some soreness where the nephrostomy tube was inserted (tube insertion site).  Blood-tinged drainage from the nephrostomy tube for the first 24 hours.  Follow these instructions at home: Activity  Return to your normal activities as told by your health care provider. Ask your health care provider what activities are safe for you.  Avoid activities that may cause the nephrostomy tubing to bend.  Do not take baths, swim, or use a hot tub until your health care provider approves. Ask your health care provider if you can take showers. Cover the nephrostomy tube dressing with a watertight covering when you take a shower.  Donot drive for 24 hours if you were given a medicine to help you relax (sedative). Care of the tube insertion site  Follow instructions from your health care provider about how to take care of your tube insertion site. Make sure you: ? Wash your hands with soap and water before you change your bandage (dressing). If soap and water are not available, use hand sanitizer. ? Change your dressing as told by your health care provider. Be careful not to pull on  the tube while removing the dressing. ? When you change the dressing, wash the skin around the tube, rinse well, and pat the skin dry.  Check the tube insertion area every day for signs of infection. Check for: ? More redness, swelling, or pain. ? More fluid or blood. ? Warmth. ? Pus or a bad  smell. Care of the nephrostomy tube and drainage bag  Always keep the tubing, the leg bag, or the bedside drainage bags below the level of the kidney so that your urine drains freely.  When connecting your nephrostomy tube to a drainage bag, make sure that there are no kinks in the tubing and that your urine is draining freely. You may want to use an elastic bandage to wrap any exposed tubing that goes from the nephrostomy tube to any of the connecting tubes.  At night, you may want to connect your nephrostomy tube or the leg bag to a larger bedside drainage bag.  Follow instructions from your health care provider about how to empty or change the drainage bag.  Empty the drainage bag when it becomes ? full.  Replace the drainage bag and any extension tubing that is connected to your nephrostomy tube every 3 weeks or as often as told by your health care provider. Your health care provider will explain how to change the drainage bag and extension tubing. General instructions  Take over-the-counter and prescription medicines only as told by your health care provider.  Keep all follow-up visits as told by your health care provider. This is important. Contact a health care provider if:  You have problems with any of the valves or tubing.  You have persistent pain or soreness in your back.  You have more redness, swelling, or pain around your tube insertion site.  You have more fluid or blood coming from your tube insertion site.  Your tube insertion site feels warm to the touch.  You have pus or a bad smell coming from your tube insertion site.  You have increased urine output or you feel burning when urinating. Get help right away if:  You have pain in your abdomen during the first week.  You have chest pain or have trouble breathing.  You have a new appearance of blood in your urine.  You have a fever or chills.  You have back pain that is not relieved by your  medicine.  You have decreased urine output.  Your nephrostomy tube comes out. This information is not intended to replace advice given to you by your health care provider. Make sure you discuss any questions you have with your health care provider. Document Released: 10/22/2003 Document Revised: 12/11/2015 Document Reviewed: 12/11/2015 Elsevier Interactive Patient Education  2018 [place] stent placement instructions   Definitions:  Ureter: The duct that transports urine from the kidney to the bladder. Stent: A plastic hollow tube that is placed into the ureter, from the kidney to the bladder to prevent the ureter from swelling shut.  General instructions:  Despite the fact that no skin incisions were used, the area around the ureter and bladder is raw and irritated. The stent is a foreign body which can further irritate the bladder wall. This irritation is manifested by increased frequency of urination, both day and night, and by an increase in the urge to urinate. In some, the urge to urinate is present almost always. Sometimes the urge is strong enough that you may not be able to stop your self from  urinating. This can often be controlled with medication but does not occur in everyone. A stent can safely be left in place for 3 months or greater.  You may see some blood in your urine while the stent is in place and a few days afterward. Do not be alarmed, even if the urine is clear for a while. Get off your feet and drink lots of fluids until clearing occurs. If you start to pass clots or don't improve, call us.  Diet:  You may return to your normal diet immediately. Because of the raw surface of your bladder, alcohol, spicy foods, foods high in acid and drinks with caffeine may cause irritation or frequency and should be used in moderation. To keep your urine flowing freely and avoid constipation, drink plenty of fluids during the day (8-10 glasses). Tip: Avoid cranberry juice  because it is very acidic.  Activity:  Your physical activity doesn't need to be restricted. However, if you are very active, you may see some blood in the urine. We suggest that you reduce your activity under the circumstances until the bleeding has stopped.  Bowels:  It is important to keep your bowels regular during the postoperative period. Straining with bowel movements can cause bleeding. A bowel movement every other day is reasonable. Use a mild laxative if needed, such as milk of magnesia 2-3 tablespoons, or 2 Dulcolax tablets. Call if you continue to have problems. If you had been taking narcotics for pain, before, during or after your surgery, you may be constipated. Take a laxative if necessary.  Medication:  You should resume your pre-surgery medications unless told not to. In addition you may be given an antibiotic to prevent or treat infection. Antibiotics are not always necessary. All medication should be taken as prescribed until the bottles are finished unless you are having an unusual reaction to one of the drugs.  Problems you should report to [place]:  a. Fever greater than 101F. b. Heavy bleeding, or clots (see notes above about blood in urine). c. Inability to urinate. d. Drug reactions (hives, rash, nausea, vomiting, diarrhea). e. Severe burning or pain with urination that is not improving.

## 2016-11-03 NOTE — Anesthesia Preprocedure Evaluation (Signed)
Anesthesia Evaluation  Patient identified by MRN, date of birth, ID band Patient awake    Reviewed: Allergy & Precautions, NPO status , Patient's Chart, lab work & pertinent test results  Airway Mallampati: I  TM Distance: <3 FB Neck ROM: Full    Dental no notable dental hx. (+) Teeth Intact   Pulmonary neg pulmonary ROS,    Pulmonary exam normal breath sounds clear to auscultation       Cardiovascular Normal cardiovascular exam Rhythm:Regular Rate:Normal     Neuro/Psych negative neurological ROS  negative psych ROS   GI/Hepatic negative GI ROS, Neg liver ROS,   Endo/Other  negative endocrine ROS  Renal/GU Right Nephrolithiasis  negative genitourinary   Musculoskeletal negative musculoskeletal ROS (+)   Abdominal   Peds  Hematology  (+) HIV, Thrombocytopenia AIDS   Anesthesia Other Findings   Reproductive/Obstetrics negative OB ROS                             Anesthesia Physical Anesthesia Plan  ASA: II  Anesthesia Plan: General   Post-op Pain Management:    Induction: Intravenous  PONV Risk Score and Plan: 4 or greater and Ondansetron, Midazolam, Propofol infusion, Metaclopromide, Promethazine and Treatment may vary due to age or medical condition  Airway Management Planned: Oral ETT  Additional Equipment:   Intra-op Plan:   Post-operative Plan: Extubation in OR  Informed Consent: I have reviewed the patients History and Physical, chart, labs and discussed the procedure including the risks, benefits and alternatives for the proposed anesthesia with the patient or authorized representative who has indicated his/her understanding and acceptance.   Dental advisory given  Plan Discussed with: CRNA, Anesthesiologist and Surgeon  Anesthesia Plan Comments:         Anesthesia Quick Evaluation

## 2016-11-03 NOTE — Anesthesia Procedure Notes (Signed)
Procedure Name: Intubation Date/Time: 11/03/2016 7:21 PM Performed by: West Pugh Pre-anesthesia Checklist: Patient identified, Emergency Drugs available, Suction available, Patient being monitored and Timeout performed Patient Re-evaluated:Patient Re-evaluated prior to induction Oxygen Delivery Method: Circle system utilized Preoxygenation: Pre-oxygenation with 100% oxygen Induction Type: IV induction, Rapid sequence and Cricoid Pressure applied Laryngoscope Size: Mac and 3 Grade View: Grade I Tube type: Oral Tube size: 7.0 mm Number of attempts: 1 Airway Equipment and Method: Stylet Placement Confirmation: ETT inserted through vocal cords under direct vision,  positive ETCO2,  CO2 detector and breath sounds checked- equal and bilateral Secured at: 20 cm Tube secured with: Tape Dental Injury: Teeth and Oropharynx as per pre-operative assessment

## 2016-11-03 NOTE — H&P (Signed)
Office Visit Report     11/03/2016   --------------------------------------------------------------------------------   Kimberly Bishop. [place]  MRN: [id number]  PRIMARY CARE:  Scharlene Gloss, MD  DOB: 04/02/1990, 26 year old Female  REFERRING:  Peggy Constant  SSN: [id number]  PROVIDER:  Resident Resident    SUPERVISING:  Louis Meckel, M.D.    TREATING:  Jonna Clark    LOCATION:  [facility] [phone]   --------------------------------------------------------------------------------   CC: I have hydronephrosis.  HPI: Kimberly Bishop is a 26 year-old female patient who was referred by Mora Bellman who is here for hydronephrosis.    #Hydronephrosis: Presented to ED 10/26/16 w/ nausea, vomiting,and epigastric pain. CBC wnl, Cr at baseline 0.9. UA concerning for infection, however not started on antibiotics. Abdominal ultrasound was obtained which showed right hydroureteroephrosis and several renal stones largest measuring 4mm. However further imaging was noted and patient was discharged with a diagnosis of peptic ulcer disease.   Negative pregnancy test 10/26/16, no intercourse since.   She presents to clinic today with worsening symptoms: right flank pain, nausea, vomiting, fevers, chills at home. Endorses headache as well. Denies dysuria, frequency, hematuria.   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 CD4 count last value 110 in January 2018. Followed by Dr. Linus Salmons of ID.     ALLERGIES: None    MEDICATIONS: Tivacy     GU PSH: None   NON-GU PSH: Appendectomy (open) - 2012    GU PMH: None   NON-GU PMH: HIV    FAMILY HISTORY: Death - Mother   SOCIAL HISTORY: Marital Status: Single Preferred Language: English; Ethnicity: Not Hispanic Or Latino; Race: Black or African American Current Smoking Status: Patient has never smoked.   Tobacco Use Assessment Completed: Used Tobacco in last 30 days? Has never drank.  Drinks 2 caffeinated  drinks per day.    REVIEW OF SYSTEMS:    GU Review Female:   Patient reports frequent urination, hard to postpone urination, and get up at night to urinate. Patient denies burning /pain with urination, leakage of urine, stream starts and stops, trouble starting your stream, have to strain to urinate, and being pregnant.  Gastrointestinal (Upper):   Patient reports nausea, vomiting, and indigestion/ heartburn.   Gastrointestinal (Lower):   Patient reports constipation. Patient denies diarrhea.  Constitutional:   Patient reports fever, night sweats, and fatigue. Patient denies weight loss.  Skin:   Patient denies itching and skin rash/ lesion.  Eyes:   Patient reports blurred vision. Patient denies double vision.  Ears/ Nose/ Throat:   Patient reports sore throat and sinus problems.   Hematologic/Lymphatic:   Patient denies swollen glands and easy bruising.  Cardiovascular:   Patient denies leg swelling and chest pains.  Respiratory:   Patient denies cough and shortness of breath.  Endocrine:   Patient denies excessive thirst.  Musculoskeletal:   Patient reports back pain.   Neurological:   Patient reports headaches and dizziness.   Psychologic:   Patient denies depression and anxiety.   VITAL SIGNS:      11/03/2016 02:45 PM  Weight 114 lb / 51.71 kg  Height 62 in / 157.48 cm  BP 115/77 mmHg  Pulse 132 /min  Temperature 100.5 F / 38.0 Bishop  BMI 20.8 kg/m   MULTI-SYSTEM PHYSICAL EXAMINATION:    Constitutional: Thin. Fatigued. Not able to keep her eyes open. Holding vomit bag.   Neck: Neck symmetrical, not swollen. Normal tracheal position.  Respiratory: No labored breathing, no use of accessory muscles.  Cardiovascular: Normal temperature, normal extremity pulses, no swelling, no varicosities.  Lymphatic: No enlargement of neck, axillae, groin.  Skin: No paleness, no jaundice, no cyanosis. No lesion, no ulcer, no rash. Pale  Neurologic / Psychiatric: Oriented to time, oriented to  place, oriented to person. No depression, no anxiety, no agitation.  Gastrointestinal: No mass, no tenderness, no rigidity, non obese abdomen. R CVA tenderness and tenderness to palpation  Eyes: Normal conjunctivae. Normal eyelids.  Ears, Nose, Mouth, and Throat: Left ear no scars, no lesions, no masses. Right ear no scars, no lesions, no masses. Nose no scars, no lesions, no masses. Normal hearing. Normal lips.  Musculoskeletal: Normal gait and station of head and neck.     PAST DATA REVIEWED:  Source Of History:  Patient  Urine Test Review:   Urinalysis  X-Ray Review: Renal Ultrasound: Reviewed Films. Reviewed Report. Discussed With Patient. Moderate hydronephrosis. Single 29mm stone visualized in renal pelvis. Limited ultrasonographic evaluation.     PROCEDURES:          Ceftriaxone 1g - [id number], [id number] Qty: 1 Adm. By: Providence Lanius  Unit: gram Lot No UR4S  Route: IM Exp. Date 12/12/2017  Freq: None Mfgr.:   Site: Left Buttock   ASSESSMENT:      ICD-10 Details  1 GU:   Hydronephrosis Unspec - N13.30    PLAN:           Orders Labs Urine Culture          Document Letter(s):  Created for Patient: Clinical Summary         Notes:   26yo F w/ HIV/AIDS presenting with 7 days of nausea, vomiting, right flank pain and several days of fevers chills. Recent RUS completed 10/26/16 which showed evidence of right nephrolithiasis and right hydronephrosis/hydroureter.   In clinic today, patient appears unwell. Actively nauseate. Vital signs show fever 100.5, tachycardia to 130s. BP wnl. UA concerning for infection.   Given this and recent imaging, will plan to take patient for cysteourethroscopy and right ureteral stent placement tonight. Given limitations of RUS, will obtain CT abdomen pelvis before or after stenting to evaluate for complete stone burden. Regardless right collecting system decompression needs to be achieved given symptoms and vital signs abnormalities.   Risks and benefits of  procedure discussed wth mom and patient. Explained surgical plan. Explicitly discussed possible risk of inability to access collecting system and possible right PCN placement, and risk of worsening infection requiring higher level of care and placement of foley catheter. Patient in agreement  -posted, consented, NPO for cysto, RIGHT ureteral stent placement, right RPG  - 1g ceftriaxone given in clinic, additional 1g will be given in periop holding along with antinausea and pain medicine  - urine culture sent  - will obtain CT abdomen pelvis once patient admitted to evaluate stone burden   Discussed with Dr. Louis Meckel, who supervised visit. Also discussed case with Dr. Karsten Ro who will be staffing procedure.     * Signed by Jonna Clark on 11/03/16 at 3:54 PM (EDT)*     The information contained in this medical record document is considered private and confidential patient information. This information can only be used for the medical diagnosis and/or medical services that are being provided by the patient's selected caregivers. This information can only be distributed outside of the patient's care if the patient agrees and signs waivers of authorization for this information to be sent to an outside source or route.  Patient was seen, examined,treatment  plan was discussed with the resident.  I have directly reviewed the clinical findings, lab, imaging studies and management of this patient in detail. I have made the necessary changes and/or additions to the above noted documentation, and agree with the documentation, as recorded by the resident.

## 2016-11-04 ENCOUNTER — Inpatient Hospital Stay (HOSPITAL_COMMUNITY): Payer: Self-pay

## 2016-11-04 ENCOUNTER — Encounter (HOSPITAL_COMMUNITY): Payer: Self-pay | Admitting: Urology

## 2016-11-04 LAB — CBC WITH DIFFERENTIAL/PLATELET
Basophils Absolute: 0.1 10*3/uL (ref 0.0–0.1)
Basophils Relative: 1 %
Eosinophils Absolute: 0 10*3/uL (ref 0.0–0.7)
Eosinophils Relative: 0 %
HCT: 31.6 % — ABNORMAL LOW (ref 36.0–46.0)
Hemoglobin: 10.7 g/dL — ABNORMAL LOW (ref 12.0–15.0)
Lymphocytes Relative: 30 %
Lymphs Abs: 1.6 10*3/uL (ref 0.7–4.0)
MCH: 27.8 pg (ref 26.0–34.0)
MCHC: 33.9 g/dL (ref 30.0–36.0)
MCV: 82.1 fL (ref 78.0–100.0)
Monocytes Absolute: 0.7 10*3/uL (ref 0.1–1.0)
Monocytes Relative: 13 %
Neutro Abs: 2.9 10*3/uL (ref 1.7–7.7)
Neutrophils Relative %: 56 %
Platelets: 177 10*3/uL (ref 150–400)
RBC: 3.85 MIL/uL — ABNORMAL LOW (ref 3.87–5.11)
RDW: 12.8 % (ref 11.5–15.5)
WBC: 5.3 10*3/uL (ref 4.0–10.5)

## 2016-11-04 LAB — BASIC METABOLIC PANEL
Anion gap: 8 (ref 5–15)
BUN: 12 mg/dL (ref 6–20)
CO2: 22 mmol/L (ref 22–32)
Calcium: 7.9 mg/dL — ABNORMAL LOW (ref 8.9–10.3)
Chloride: 103 mmol/L (ref 101–111)
Creatinine, Ser: 0.91 mg/dL (ref 0.44–1.00)
GFR calc Af Amer: >60 mL/min (ref >60)
GFR calc non Af Amer: >60 mL/min (ref >60)
Glucose, Bld: 117 mg/dL — ABNORMAL HIGH (ref 65–99)
Potassium: 3.4 mmol/L — ABNORMAL LOW (ref 3.5–5.1)
Sodium: 133 mmol/L — ABNORMAL LOW (ref 135–145)

## 2016-11-04 MED ORDER — OXYCODONE HCL 5 MG PO TABS
5.0000 mg | ORAL_TABLET | ORAL | Status: DC | PRN
  Administered 2016-11-04 – 2016-11-10 (×9): 5 mg via ORAL
  Filled 2016-11-04 (×12): qty 1

## 2016-11-04 MED ORDER — HYDROMORPHONE HCL-NACL 0.5-0.9 MG/ML-% IV SOSY
0.5000 mg | PREFILLED_SYRINGE | INTRAVENOUS | Status: DC | PRN
  Administered 2016-11-04: 0.5 mg via INTRAVENOUS
  Administered 2016-11-05 (×2): 1 mg via INTRAVENOUS
  Administered 2016-11-05 (×3): 0.5 mg via INTRAVENOUS
  Administered 2016-11-06 (×2): 1 mg via INTRAVENOUS
  Administered 2016-11-07 – 2016-11-09 (×7): 0.5 mg via INTRAVENOUS
  Administered 2016-11-09 – 2016-11-10 (×2): 1 mg via INTRAVENOUS
  Administered 2016-11-10: 0.5 mg via INTRAVENOUS
  Filled 2016-11-04 (×3): qty 1
  Filled 2016-11-04: qty 2
  Filled 2016-11-04: qty 1
  Filled 2016-11-04 (×2): qty 2
  Filled 2016-11-04 (×2): qty 1
  Filled 2016-11-04 (×3): qty 2
  Filled 2016-11-04 (×8): qty 1

## 2016-11-04 MED ORDER — OXYBUTYNIN CHLORIDE 5 MG PO TABS: 5.0000 mg | ORAL_TABLET | Freq: Three times a day (TID) | ORAL | Status: DC | PRN

## 2016-11-04 MED ORDER — DOLUTEGRAVIR SODIUM 50 MG PO TABS
50.0000 mg | ORAL_TABLET | Freq: Every day | ORAL | Status: DC
  Administered 2016-11-04 – 2016-11-10 (×7): 50 mg via ORAL
  Filled 2016-11-04 (×7): qty 1

## 2016-11-04 MED ORDER — ACETAMINOPHEN 325 MG PO TABS
650.0000 mg | ORAL_TABLET | ORAL | Status: DC | PRN
  Administered 2016-11-06 – 2016-11-08 (×2): 650 mg via ORAL
  Filled 2016-11-04 (×3): qty 2

## 2016-11-04 MED ORDER — TAMSULOSIN HCL 0.4 MG PO CAPS
0.4000 mg | ORAL_CAPSULE | Freq: Every day | ORAL | Status: DC
  Administered 2016-11-04 – 2016-11-06 (×3): 0.4 mg via ORAL
  Filled 2016-11-04 (×3): qty 1

## 2016-11-04 MED ORDER — EMTRICITAB-RILPIVIR-TENOFOV AF 200-25-25 MG PO TABS
1.0000 | ORAL_TABLET | Freq: Every day | ORAL | Status: DC
  Administered 2016-11-04 – 2016-11-10 (×7): 1 via ORAL
  Filled 2016-11-04 (×9): qty 1

## 2016-11-04 MED ORDER — SODIUM CHLORIDE 0.9 % IV SOLN
INTRAVENOUS | Status: DC
  Administered 2016-11-04 – 2016-11-05 (×3): via INTRAVENOUS
  Administered 2016-11-06: 50 mL/h via INTRAVENOUS
  Administered 2016-11-08: via INTRAVENOUS

## 2016-11-04 MED ORDER — VANCOMYCIN HCL IN DEXTROSE 750-5 MG/150ML-% IV SOLN
750.0000 mg | Freq: Two times a day (BID) | INTRAVENOUS | Status: DC
  Administered 2016-11-04 – 2016-11-06 (×5): 750 mg via INTRAVENOUS
  Filled 2016-11-04 (×6): qty 150

## 2016-11-04 MED ORDER — POTASSIUM CHLORIDE 20 MEQ/15ML (10%) PO SOLN
20.0000 meq | Freq: Once | ORAL | Status: AC
Start: 2016-11-04 — End: 2016-11-04
  Administered 2016-11-04: 20 meq via ORAL
  Filled 2016-11-04: qty 15

## 2016-11-04 MED ORDER — OXYBUTYNIN CHLORIDE 5 MG PO TABS
5.0000 mg | ORAL_TABLET | Freq: Three times a day (TID) | ORAL | Status: DC
  Administered 2016-11-04 – 2016-11-10 (×11): 5 mg via ORAL
  Filled 2016-11-04 (×16): qty 1

## 2016-11-04 MED ORDER — PHENOL 1.4 % MT LIQD
1.0000 | OROMUCOSAL | Status: DC | PRN
  Filled 2016-11-04: qty 177

## 2016-11-04 MED ORDER — ONDANSETRON HCL 4 MG/2ML IJ SOLN
4.0000 mg | INTRAMUSCULAR | Status: DC | PRN
  Administered 2016-11-07: 4 mg via INTRAVENOUS
  Filled 2016-11-04: qty 2

## 2016-11-04 MED ORDER — DIPHENHYDRAMINE HCL 25 MG PO CAPS: 25.0000 mg | ORAL_CAPSULE | Freq: Four times a day (QID) | ORAL | Status: DC | PRN

## 2016-11-04 NOTE — Progress Notes (Signed)
Pharmacy Antibiotic Note  Kimberly Bishop is a 26 y.o. female  C/o nausea, vomiting and epigastric pain admitted on 11/03/2016 with bacteremia.  Pharmacy has been consulted for vancomycin dosing.  Plan: Rocephin 2 Gm IV q24h (MD) Vancomycin 750 mg IV q12h VT=15-20 mg/L F/u scr/cultures/levels  Height: 5\' 2"  (157.5 cm) Weight: 110 lb 6 oz (50.1 kg) IBW/kg (Calculated) : 50.1  Temp (24hrs), Avg:99.8 F (37.7 C), Min:99 F (37.2 C), Max:101.1 F (38.4 C)   Recent Labs Lab 11/03/16 1721 11/03/16 2133  WBC  --  8.0  CREATININE 1.05*  --     Estimated Creatinine Clearance: 64.8 mL/min (A) (by C-G formula based on SCr of 1.05 mg/dL (H)).    No Known Allergies  Antimicrobials this admission: 8/23 rocephin >>  8/23 vancomycin >>   Dose adjustments this admission:   Microbiology results:  BCx:   UCx:    Sputum:    MRSA PCR:   Thank you for allowing pharmacy to be a part of this patient's care.  Dorrene German 11/04/2016 2:14 AM

## 2016-11-04 NOTE — Consult Note (Signed)
Urology Progress Note   1 Day Post-Op  Subjective: S/p right ureteral stent placement yesterday PM. Foley placement in OR. Tolerating stent well. Some burning, suprapubic pain.  Febrile overnight (T max 101.1), tachycardic with normal BP. Normal O2 sat. Labs this AM with normal WBC, Cr.    Objective: Vital signs in last 24 hours: Temp:  [99 F (37.2 C)-101.1 F (38.4 C)] 99.8 F (37.7 C) (08/24 0555) Pulse Rate:  [102-129] 105 (08/24 0555) Resp:  [16-24] 22 (08/24 0555) BP: (100-125)/(55-83) 100/55 (08/24 0555) SpO2:  [96 %-100 %] 96 % (08/24 0555) Weight:  [48.7 kg (107 lb 6.4 oz)-50.1 kg (110 lb 6 oz)] 50.1 kg (110 lb 6 oz) (08/23 2123)  Intake/Output from previous day: 08/23 0701 - 08/24 0700 In: 1000 [I.V.:1000] Out: 425 [Urine:425] Intake/Output this shift: No intake/output data recorded.  Physical Exam:  General: Alert and oriented CV: RRR Lungs: Clear Abdomen: Soft, non tender  GU: Foley with clear yellow urine in tubing  Ext: NT, No erythema  Lab Results:  Recent Labs  11/03/16 2133 11/04/16 0458  HGB 13.0 10.7*  HCT 37.2 31.6*   BMET  Recent Labs  11/03/16 1721 11/04/16 0458  NA 135 133*  K 4.1 3.4*  CL 98* 103  CO2 23 22  GLUCOSE 91 117*  BUN 15 12  CREATININE 1.05* 0.91  CALCIUM 9.2 7.9*     Studies/Results: Dg C-arm 1-60 Min  Result Date: 11/03/2016 CLINICAL DATA:  Right ureteral stent placement. EXAM: DG C-ARM 61-120 MIN FLUOROSCOPY TIME:  48 seconds. COMPARISON:  Ultrasound of October 26, 2016. FINDINGS: Ten fluoroscopic images were obtained during cystoscopy. These images demonstrate large staghorn type calculus in right kidney. Contrast was injected into the ureter. Catheter tip is seen in the right renal pelvis. IMPRESSION: Large right-sided staghorn calculus is noted. Electronically Signed   By: Marijo Conception, M.D.   On: 11/03/2016 21:28    Assessment/Plan:  26 y.o. female w/ history of HIV/AIDS and nephrolithiasis s/p right  ureteral stenting for right hydronephrosis and UTI.   - replete K - discussed patient with ID: will put her back on home antiretroviral, and ensure she is discharged with bactrim DS 1 tablet daily for PCP ppx - continue foley catheter to drainage until patient afebrile for 24 hours  - CT abdomen pelvis to assess stone burden  - continue ceftriaxone/vanc - urine culture pending through [facility]  - will obtain blood cultures given fever overnight - regular diet, continue IVF    LOS: 1 day   FILIPPOU, PAULINE L 11/04/2016, 7:30 AM   Patient was seen, examined,treatment plan was discussed with the resident.  I have directly reviewed the clinical findings, lab, imaging studies and management of this patient in detail. I have made the necessary changes and/or additions to the above noted documentation, and agree with the documentation, as recorded by the resident.

## 2016-11-05 NOTE — Progress Notes (Signed)
2 Days Post-Op   Subjective/Chief Complaint:  1 - Urosepsis - fevers, tachycardia at presentation with bacteruria and partial Rt renal obsstruction. UCX 8/23 from [facility] office pending. Placed on empiric Vanc + Rocephin per pharmacy in addition to her baseline HAART therapy for HIV. [facility] 8/24 pending.  2 - Rt Staghorn Kidney Stone- large Rt staghorn stone by CT 8/23 on eval urosepsis and flank pain. Rt JJ stent placed 8/23 for urgent renal decompression.  Today "Ginny" is stable. Fever curve appears to be trending down. CX's still pending.    Objective: Vital signs in last 24 hours: Temp:  [98.8 F (37.1 C)-102 F (38.9 C)] 98.8 F (37.1 C) (08/25 0442) Pulse Rate:  [94-110] 94 (08/25 0442) Resp:  [15] 15 (08/25 0442) BP: (101-119)/(59-66) 102/59 (08/25 0442) SpO2:  [94 %-99 %] 94 % (08/25 0442) Last BM Date: 10/26/16  Intake/Output from previous day: 08/24 0701 - 08/25 0700 In: 4116.7 [P.O.:960; I.V.:2956.7; IV Piggyback:200] Out: 1900 [Urine:1900] Intake/Output this shift: No intake/output data recorded.  General appearance: alert and cooperative Eyes: negative Nose: Nares normal. Septum midline. Mucosa normal. No drainage or sinus tenderness. Throat: lips, mucosa, and tongue normal; teeth and gums normal Neck: supple, symmetrical, trachea midline Back: symmetric, no curvature. ROM normal. No CVA tenderness. Resp: non-labored on room air.  Cardio: Nl rate GI: soft, non-tender; bowel sounds normal; no masses,  no organomegaly Pelvic: external genitalia normal and foley in palce wtih yellow urine and scant debris.  Extremities: extremities normal, atraumatic, no cyanosis or edema Skin: Skin color, texture, turgor normal. No rashes or lesions Lymph nodes: Cervical, supraclavicular, and axillary nodes normal. Neurologic: Grossly normal  Lab Results:   Recent Labs  11/03/16 2133 11/04/16 0458  WBC 8.0 5.3  HGB 13.0 10.7*  HCT 37.2 31.6*  PLT 181 177    BMET  Recent Labs  11/03/16 1721 11/04/16 0458  NA 135 133*  K 4.1 3.4*  CL 98* 103  CO2 23 22  GLUCOSE 91 117*  BUN 15 12  CREATININE 1.05* 0.91  CALCIUM 9.2 7.9*   PT/INR No results for input(s): LABPROT, INR in the last 72 hours. ABG No results for input(s): PHART, HCO3 in the last 72 hours.  Invalid input(s): PCO2, PO2  Studies/Results: Ct Abdomen Pelvis Wo Contrast  Result Date: 11/04/2016 CLINICAL DATA:  Generalized abdominal pain beginning yesterday. One day status post right ureteral stent placement for staghorn calculus. HIV. EXAM: CT ABDOMEN AND PELVIS WITHOUT CONTRAST TECHNIQUE: Multidetector CT imaging of the abdomen and pelvis was performed following the standard protocol without IV contrast. COMPARISON:  01/13/2009 FINDINGS: Lower chest: No acute findings. Hepatobiliary:  No masses visualized on this unenhanced exam. Pancreas: No mass or inflammatory process visualized on this unenhanced exam. Spleen:  Within normal limits in size. Adrenals/Urinary tract: Partial staghorn calculus is seen in the right renal pelvis and lower pole collecting system. A right ureteral stent is seen in place with proximal tip of the stent in the upper pole collecting system. Mild-to-moderate right pelvicaliectasis is seen with high attenuation hemorrhage in several lower pole calices. No evidence of perinephric or retroperitoneal hemorrhage. Foley catheter seen within empty urinary bladder. Stomach/Bowel: No evidence of obstruction, inflammatory process, or abnormal fluid collections. Vascular/Lymphatic: No pathologically enlarged lymph nodes identified. No evidence of abdominal aortic aneurysm. Reproductive:  No mass or other significant abnormality. Other:  None. Musculoskeletal:  No suspicious bone lesions identified. IMPRESSION: Partial staghorn calculus in right renal collecting system, with mild to moderate pelvicaliectasis. Internal  right ureteral stent, with hemorrhage within several  lower pole calices in the right renal collecting system. No evidence of perinephric or retroperitoneal hemorrhage. Electronically Signed   By: Earle Gell M.D.   On: 11/04/2016 08:58   Dg C-arm 1-60 Min  Result Date: 11/03/2016 CLINICAL DATA:  Right ureteral stent placement. EXAM: DG C-ARM 61-120 MIN FLUOROSCOPY TIME:  48 seconds. COMPARISON:  Ultrasound of October 26, 2016. FINDINGS: Ten fluoroscopic images were obtained during cystoscopy. These images demonstrate large staghorn type calculus in right kidney. Contrast was injected into the ureter. Catheter tip is seen in the right renal pelvis. IMPRESSION: Large right-sided staghorn calculus is noted. Electronically Signed   By: Marijo Conception, M.D.   On: 11/03/2016 21:28    Anti-infectives: Anti-infectives    Start     Dose/Rate Route Frequency Ordered Stop   11/04/16 1930  cefTRIAXone (ROCEPHIN) 2 g in dextrose 5 % 50 mL IVPB     2 g 100 mL/hr over 30 Minutes Intravenous Every 24 hours 11/03/16 1918     11/04/16 1200  emtricitabine-rilpivir-tenofovir AF (ODEFSEY) 200-25-25 MG per tablet 1 tablet     1 tablet Oral Daily with breakfast 11/04/16 0749     11/04/16 1000  dolutegravir (TIVICAY) tablet 50 mg     50 mg Oral Daily 11/04/16 0749     11/04/16 0215  vancomycin (VANCOCIN) IVPB 750 mg/150 ml premix     750 mg 150 mL/hr over 60 Minutes Intravenous Every 12 hours 11/04/16 0211     11/03/16 1630  cefTRIAXone (ROCEPHIN) 2 g in dextrose 5 % 50 mL IVPB     2 g 100 mL/hr over 30 Minutes Intravenous 30 min pre-op 11/03/16 1632 11/03/16 1942      Assessment/Plan:  1 - Urosepsis - improving clinically. Reinforced goals of afebrile x 24 hrs and preliminary CX results before discharge. Continue empiric ABX for now.   2 - Rt Staghorn Kidney Stone-  Temporized with Rt stent, will need PCNL in elective setting to address stone in few weeks after clears infectious parameters.   DC foley, dec IVF to 60, Remain in house  [facility],  [place] 11/05/2016

## 2016-11-06 LAB — CREATININE, SERUM
Creatinine, Ser: 0.85 mg/dL (ref 0.44–1.00)
GFR calc Af Amer: >60 mL/min (ref >60)
GFR calc non Af Amer: >60 mL/min (ref >60)

## 2016-11-06 LAB — VANCOMYCIN, TROUGH: Vancomycin Tr: 8 ug/mL — ABNORMAL LOW (ref 15–20)

## 2016-11-06 MED ORDER — VANCOMYCIN HCL IN DEXTROSE 1-5 GM/200ML-% IV SOLN
1000.0000 mg | Freq: Two times a day (BID) | INTRAVENOUS | Status: DC
Start: 2016-11-06 — End: 2016-11-08
  Administered 2016-11-06 – 2016-11-08 (×4): 1000 mg via INTRAVENOUS
  Filled 2016-11-06 (×4): qty 200

## 2016-11-06 NOTE — Progress Notes (Signed)
Pharmacy Antibiotic Note  Kimberly Bishop is a 26 y.o. female  C/o nausea, vomiting and epigastric pain admitted on 11/03/2016 with bacteruria  Pharmacy had been consulted for vancomycin dosing on 8/24  Plan: 1) Continue Rocephin 2 Gm IV q24h (MD) 2) Vanc trough subtherapeutic - change dose from vancomycin 750 mg IV q12h to 1g IV q12 with goal VT 10-15 3) With dosing changes however, would recommend discontinue vancomycin with negative blood cultures x 48 hours now   Height: 5\' 2"  (157.5 cm) Weight: 110 lb 6 oz (50.1 kg) IBW/kg (Calculated) : 50.1  Temp (24hrs), Avg:99.7 F (37.6 C), Min:98.7 F (37.1 C), Max:100.4 F (38 C)   Recent Labs Lab 11/03/16 1721 11/03/16 2133 11/04/16 0458 11/06/16 0505 11/06/16 1525  WBC  --  8.0 5.3  --   --   CREATININE 1.05*  --  0.91 0.85  --   VANCOTROUGH  --   --   --   --  8*    Estimated Creatinine Clearance: 80 mL/min (by C-G formula based on SCr of 0.85 mg/dL).    No Known Allergies  Antimicrobials this admission: 8/23 rocephin >>  8/23 vancomycin >>    Thank you for allowing pharmacy to be a part of this patient's care.   Adrian Saran, PharmD, BCPS Pager [phone] 11/06/2016 4:35 PM

## 2016-11-06 NOTE — Progress Notes (Signed)
3 Days Post-Op   Subjective/Chief Complaint:  1 - Urosepsis - fevers, tachycardia at presentation with bacteruria and partial Rt renal obstruction. [place] 8/23 from [facility] office non-clonal. Placed on empiric Vanc + Rocephin per pharmacy in addition to her baseline HAART therapy for HIV. [facility] 8/24 pending / no growth to date.  2 - Rt Staghorn Kidney Stone- large Rt staghorn stone by CT 8/23 on eval urosepsis and flank pain. Rt JJ stent placed 8/23 for urgent renal decompression.  Today "Kimberly Bishop" is stable. Fever curve appears to be trending down but still present. Office CX's non-clonal.    Objective: Vital signs in last 24 hours: Temp:  [99.3 F (37.4 C)-100.8 F (38.2 C)] 100.4 F (38 C) (08/26 0600) Pulse Rate:  [121] 121 (08/25 1314) Resp:  [16-18] 18 (08/26 0600) BP: (102-117)/(63-66) 102/66 (08/26 0600) SpO2:  [93 %-97 %] 97 % (08/26 0600) Last BM Date: 11/02/16  Intake/Output from previous day: 08/25 0701 - 08/26 0700 In: 3894.6 [P.O.:1680; I.V.:1914.6; IV Piggyback:300] Out: 1600 [Urine:1600] Intake/Output this shift: No intake/output data recorded.  General appearance: alert, cooperative, appears stated age and minimal malaise.  Eyes: negative Nose: Nares normal. Septum midline. Mucosa normal. No drainage or sinus tenderness. Throat: lips, mucosa, and tongue normal; teeth and gums normal Neck: supple, symmetrical, trachea midline Back: symmetric, no curvature. ROM normal. No CVA tenderness. Resp: non-labored on room air.  Cardio: Nl rate Extremities: extremities normal, atraumatic, no cyanosis or edema Pulses: 2+ and symmetric Skin: Skin color, texture, turgor normal. No rashes or lesions Lymph nodes: Cervical, supraclavicular, and axillary nodes normal. Neurologic: Grossly normal  Lab Results:   Recent Labs  11/03/16 2133 11/04/16 0458  WBC 8.0 5.3  HGB 13.0 10.7*  HCT 37.2 31.6*  PLT 181 177   BMET  Recent Labs  11/03/16 1721 11/04/16 0458  11/06/16 0505  NA 135 133*  --   K 4.1 3.4*  --   CL 98* 103  --   CO2 23 22  --   GLUCOSE 91 117*  --   BUN 15 12  --   CREATININE 1.05* 0.91 0.85  CALCIUM 9.2 7.9*  --    PT/INR No results for input(s): LABPROT, INR in the last 72 hours. ABG No results for input(s): PHART, HCO3 in the last 72 hours.  Invalid input(s): PCO2, PO2  Studies/Results: No results found.  Anti-infectives: Anti-infectives    Start     Dose/Rate Route Frequency Ordered Stop   11/04/16 1930  cefTRIAXone (ROCEPHIN) 2 g in dextrose 5 % 50 mL IVPB     2 g 100 mL/hr over 30 Minutes Intravenous Every 24 hours 11/03/16 1918     11/04/16 1200  emtricitabine-rilpivir-tenofovir AF (ODEFSEY) 200-25-25 MG per tablet 1 tablet     1 tablet Oral Daily with breakfast 11/04/16 0749     11/04/16 1000  dolutegravir (TIVICAY) tablet 50 mg     50 mg Oral Daily 11/04/16 0749     11/04/16 0215  vancomycin (VANCOCIN) IVPB 750 mg/150 ml premix     750 mg 150 mL/hr over 60 Minutes Intravenous Every 12 hours 11/04/16 0211     11/03/16 1630  cefTRIAXone (ROCEPHIN) 2 g in dextrose 5 % 50 mL IVPB     2 g 100 mL/hr over 30 Minutes Intravenous 30 min pre-op 11/03/16 1632 11/03/16 1942      Assessment/Plan:  1 - Urosepsis - improving clinically. Reinforced goals of afebrile x 24 hrs before discharge, possibly tomorrow based on  fever curve. Continue empiric ABX for now.   2 - Rt Staghorn Kidney Stone-  Temporized with Rt stent, will need PCNL in elective setting to address stone in few weeks after clears infectious parameters.  Remain in house  [facility], [place] 11/06/2016

## 2016-11-07 ENCOUNTER — Inpatient Hospital Stay (HOSPITAL_COMMUNITY): Payer: Self-pay

## 2016-11-07 HISTORY — PX: IR NEPHROSTOMY PLACEMENT RIGHT: IMG6064

## 2016-11-07 LAB — PROTIME-INR
INR: 1.27
Prothrombin Time: 16 seconds — ABNORMAL HIGH (ref 11.4–15.2)

## 2016-11-07 LAB — APTT: aPTT: 34 seconds (ref 24–36)

## 2016-11-07 MED ORDER — LIDOCAINE HCL (PF) 1 % IJ SOLN
INTRAMUSCULAR | Status: AC | PRN
  Administered 2016-11-07: 7 mL

## 2016-11-07 MED ORDER — IOPAMIDOL (ISOVUE-300) INJECTION 61%
INTRAVENOUS | Status: AC
  Administered 2016-11-07: 20 mL
  Filled 2016-11-07: qty 50

## 2016-11-07 MED ORDER — FENTANYL CITRATE (PF) 100 MCG/2ML IJ SOLN
INTRAMUSCULAR | Status: AC
  Filled 2016-11-07: qty 4

## 2016-11-07 MED ORDER — SODIUM CHLORIDE 0.9% FLUSH
5.0000 mL | Freq: Three times a day (TID) | INTRAVENOUS | Status: DC
  Administered 2016-11-08 – 2016-11-10 (×4): 5 mL via INTRAVENOUS

## 2016-11-07 MED ORDER — LIDOCAINE HCL (PF) 1 % IJ SOLN
INTRAMUSCULAR | Status: AC
  Filled 2016-11-07: qty 30

## 2016-11-07 MED ORDER — MIDAZOLAM HCL 2 MG/2ML IJ SOLN
INTRAMUSCULAR | Status: AC | PRN
  Administered 2016-11-07 (×2): 1 mg via INTRAVENOUS
  Administered 2016-11-07 (×2): 0.5 mg via INTRAVENOUS
  Administered 2016-11-07: 1 mg via INTRAVENOUS

## 2016-11-07 MED ORDER — MIDAZOLAM HCL 2 MG/2ML IJ SOLN
INTRAMUSCULAR | Status: AC
  Filled 2016-11-07: qty 4

## 2016-11-07 MED ORDER — IOPAMIDOL (ISOVUE-300) INJECTION 61%: 20.0000 mL | Freq: Once | INTRAVENOUS | Status: DC | PRN

## 2016-11-07 MED ORDER — FENTANYL CITRATE (PF) 100 MCG/2ML IJ SOLN
INTRAMUSCULAR | Status: AC | PRN
  Administered 2016-11-07: 12.5 ug via INTRAVENOUS
  Administered 2016-11-07: 50 ug via INTRAVENOUS
  Administered 2016-11-07 (×2): 25 ug via INTRAVENOUS
  Administered 2016-11-07: 50 ug via INTRAVENOUS
  Administered 2016-11-07: 25 ug via INTRAVENOUS

## 2016-11-07 NOTE — Progress Notes (Signed)
Patient ID: Kimberly Bishop, female   DOB: 26-Oct-1990, 26 y.o.   MRN: [id number] 4 Days Post-Op  Assessment: 1.  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  Her follow-up CT scan revealed dilatation of the calyces and specifically contrast still in the lower pole calyx about 12 hours after having undergone her contrasted procedure.  This indicated to me that she might have not received adequate drainage of all portions of her kidney however she did seem to be improving on antibiotic therapy but continues to spike fever.  2.  Right staghorn calculus.  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  I spoke with the patient about my concern about inadequate drainage of the lower pole of her kidney and the need for a percutaneous nephrostomy tube eventually for access to her staghorn calculus.  Since she is in the hospital, does not seem to be fully responding to antibiotics and would benefit from drainage of the lower pole I told her that I am going to have a right percutaneous nephrostomy tube placed.  I discussed this procedure with her briefly and the goals of this procedure as well as the need to maintain this until she undergoes her PCNL.  Plan: 1.  I spoke to the interventional radiologist and have asked them to gain access to the lower pole which will allow drainage of the lower pole and serve as access for her PCNL. 2.  N.p.o. and coags obtained. 3.  Continue current antibiotics.   Subjective: Patient reports feeling washed out and weak.  Objective: Vital signs in last 24 hours: Temp:  [98.7 F (37.1 C)-101.9 F (38.8 C)] 99.4 F (37.4 C) (08/27 0512) Pulse Rate:  [84-108] 84 (08/27 0512) Resp:  [18] 18 (08/27 0512) BP:  (112-115)/(68-74) 112/68 (08/27 0512) SpO2:  [97 %-100 %] 97 % (08/27 0512)  Intake/Output from previous day: 08/26 0701 - 08/27 0700 In: 1750 [I.V.:1250; IV Piggyback:500] Out: 1050 [Urine:1050] Intake/Output this shift: No intake/output data recorded.  Past Medical History:  Diagnosis Date  . AIDS (acquired immunodeficiency syndrome), CD4 <=200 ([place])   . HIV (human immunodeficiency virus infection) ([place])   . Immune deficiency disorder ([place])    Current Facility-Administered Medications  Medication Dose Route Frequency Provider Last Rate Last Dose  . 0.9 %  sodium chloride infusion   Intravenous Continuous Alexis Frock, MD 50 mL/hr at 11/06/16 0258 50 mL/hr at 11/06/16 0258  . acetaminophen (TYLENOL) tablet 650 mg  650 mg Oral Q4H PRN Kathie Rhodes, MD   650 mg at 11/06/16 2319  . cefTRIAXone (ROCEPHIN) 2 g in dextrose 5 % 50 mL IVPB  2 g Intravenous Q24H Kathie Rhodes, MD   Stopped at 11/06/16 2044  . diphenhydrAMINE (BENADRYL) capsule 25 mg  25 mg Oral Q6H PRN Filippou, Braxton Feathers, MD      . dolutegravir (TIVICAY) tablet 50 mg  50 mg Oral Daily Filippou, Braxton Feathers, MD   50 mg at 11/06/16 0902  . emtricitabine-rilpivir-tenofovir AF (ODEFSEY) 200-25-25 MG per tablet 1 tablet  1 tablet Oral Q breakfast Filippou, Braxton Feathers, MD   1 tablet at 11/06/16 0857  . HYDROmorphone (DILAUDID) injection 0.5-1 mg  0.5-1 mg Intravenous Q2H PRN Kathie Rhodes, MD   0.5 mg at 11/07/16  1036  . ondansetron (ZOFRAN) injection 4 mg  4 mg Intravenous Q4H PRN Kathie Rhodes, MD   4 mg at 11/07/16 1035  . oxybutynin (DITROPAN) tablet 5 mg  5 mg Oral TID Filippou, Braxton Feathers, MD   5 mg at 11/06/16 2013  . oxyCODONE (Oxy IR/ROXICODONE) immediate release tablet 5 mg  5 mg Oral Q4H PRN Kathie Rhodes, MD   5 mg at 11/06/16 0902  . phenol (CHLORASEPTIC) mouth spray 1 spray  1 spray Mouth/Throat PRN Kathie Rhodes, MD      . vancomycin (VANCOCIN) IVPB 1000 mg/200 mL premix  1,000 mg Intravenous Q12H Adrian Saran, RPH  200 mL/hr at 11/07/16 0555 1,000 mg at 11/07/16 0555    Physical Exam:  General: Patient is in no apparent distress Lungs: Normal respiratory effort, chest expands symmetrically. GI: The abdomen is soft and nontender without mass.    Lab Results: No results for input(s): WBC, HGB, HCT in the last 72 hours. BMET  Recent Labs  11/06/16 0505  CREATININE 0.85    Recent Labs  11/07/16 1056  INR 1.27   No results for input(s): LABURIN in the last 72 hours. Results for orders placed or performed during the hospital encounter of 11/03/16  Culture, blood (routine x 2)     Status: None (Preliminary result)   Collection Time: 11/04/16  8:26 AM  Result Value Ref Range Status   Specimen Description BLOOD LEFT ARM  Final   Special Requests IN PEDIATRIC BOTTLE Blood Culture adequate volume  Final   Culture   Final    NO GROWTH 2 DAYS Performed at [facility], [place] [address], [place], [place] [address]    Report Status PENDING  Incomplete  Culture, blood (routine x 2)     Status: None (Preliminary result)   Collection Time: 11/04/16  8:26 AM  Result Value Ref Range Status   Specimen Description BLOOD LEFT HAND  Final   Special Requests   Final    BOTTLES DRAWN AEROBIC ONLY Blood Culture adequate volume   Culture   Final    NO GROWTH 2 DAYS Performed at [facility], [place] [address], [place], [place] [address]    Report Status PENDING  Incomplete    Studies/Results: No results found.     Claybon Jabs 11/07/2016, 12:33 PM

## 2016-11-07 NOTE — Consult Note (Signed)
Chief Complaint: Patient was seen in consultation today for hydronephrosis  Referring Physician(s):  Dr. Kathie Rhodes  Supervising Physician: Sandi Mariscal  Patient Status: [facility] - In-pt  History of Present Illness: Kimberly Bishop is a 26 y.o. female with past medical history of HIV/AIDS who presents with large staghorn calculus of the right kidney. Patient underwent stent placement, but continues with right hydronephrosis and concern for incomplete drainage of renal collecting system.   IR consulted for percutaneous right nephrostomy tube at the request of Dr. Karsten Ro.   Patient is NPO.  She does not take blood thinners.   Past Medical History:  Diagnosis Date  . AIDS (acquired immunodeficiency syndrome), CD4 <=200 ([place])   . HIV (human immunodeficiency virus infection) ([place])   . Immune deficiency disorder [facility])     Past Surgical History:  Procedure Laterality Date  . APPENDECTOMY  2011  . CYSTOSCOPY W/ URETERAL STENT PLACEMENT Right 11/03/2016   Procedure: CYSTOSCOPY WITH RETROGRADE PYELOGRAM/URETERAL STENT PLACEMENT;  Surgeon: Kathie Rhodes, MD;  Location: [facility] ORS;  Service: Urology;  Laterality: Right;  . NO PAST SURGERIES      Allergies: Patient has no known allergies.  Medications: Prior to Admission medications   Medication Sig Start Date End Date Taking? Authorizing Provider  acetaminophen (TYLENOL) 500 MG tablet Take 1,000 mg by mouth every 6 (six) hours as needed for mild pain, moderate pain, fever or headache.   Yes [provider]  dolutegravir (TIVICAY) 50 MG tablet Take 1 tablet (50 mg total) by mouth daily. 05/24/16  Yes Comer, Okey Regal, MD  emtricitabine-rilpivir-tenofovir AF (ODEFSEY) 200-25-25 MG TABS tablet Take 1 tablet by mouth daily. 05/24/16  Yes Comer, Okey Regal, MD  pantoprazole (PROTONIX) 20 MG tablet Take 1 tablet (20 mg total) by mouth daily. Patient not taking: Reported on 11/03/2016 10/26/16   Constant, Peggy, MD     Family History    Problem Relation Age of Onset  . Adopted: Yes  . Hypertension Mother     Social History   Social History  . Marital status: Single    Spouse name: N/A  . Number of children: N/A  . Years of education: N/A   Social History Main Topics  . Smoking status: Never Smoker  . Smokeless tobacco: Never Used  . Alcohol use No     Comment: occasional  . Drug use: No  . Sexual activity: Yes    Partners: Male    Birth control/ protection: Condom     Comment: same partner for a couple months   Other Topics Concern  . None   Social History Narrative  . None     Review of Systems  Constitutional: Negative for fatigue and fever.  Respiratory: Negative for cough and shortness of breath.   Cardiovascular: Negative for chest pain.  Genitourinary: Positive for flank pain.  Psychiatric/Behavioral: Negative for behavioral problems and confusion.    Vital Signs: BP 112/68 (BP Location: Left Arm)   Pulse 84   Temp 99.4 F (37.4 C) (Oral)   Resp 18   Ht 5\' 2"  (1.575 m)   Wt 110 lb 6 oz (50.1 kg)   LMP 10/27/2016   SpO2 97%   BMI 20.19 kg/m   Physical Exam  Constitutional: She is oriented to person, place, and time. She appears well-developed.  Cardiovascular: Normal rate, regular rhythm and normal heart sounds.   Pulmonary/Chest: Effort normal and breath sounds normal.  Abdominal: Soft.  Neurological: She is alert and oriented to  person, place, and time.  Skin: Skin is warm and dry.  Psychiatric: She has a normal mood and affect. Her behavior is normal. Judgment and thought content normal.  Nursing note and vitals reviewed.   Mallampati Score:  MD Evaluation Airway: WNL Heart: WNL Abdomen: WNL Chest/ Lungs: WNL ASA  Classification: 3 Mallampati/Airway Score: Two  Imaging: Ct Abdomen Pelvis Wo Contrast  Result Date: 11/04/2016 CLINICAL DATA:  Generalized abdominal pain beginning yesterday. One day status post right ureteral stent placement for staghorn calculus.  HIV. EXAM: CT ABDOMEN AND PELVIS WITHOUT CONTRAST TECHNIQUE: Multidetector CT imaging of the abdomen and pelvis was performed following the standard protocol without IV contrast. COMPARISON:  01/13/2009 FINDINGS: Lower chest: No acute findings. Hepatobiliary:  No masses visualized on this unenhanced exam. Pancreas: No mass or inflammatory process visualized on this unenhanced exam. Spleen:  Within normal limits in size. Adrenals/Urinary tract: Partial staghorn calculus is seen in the right renal pelvis and lower pole collecting system. A right ureteral stent is seen in place with proximal tip of the stent in the upper pole collecting system. Mild-to-moderate right pelvicaliectasis is seen with high attenuation hemorrhage in several lower pole calices. No evidence of perinephric or retroperitoneal hemorrhage. Foley catheter seen within empty urinary bladder. Stomach/Bowel: No evidence of obstruction, inflammatory process, or abnormal fluid collections. Vascular/Lymphatic: No pathologically enlarged lymph nodes identified. No evidence of abdominal aortic aneurysm. Reproductive:  No mass or other significant abnormality. Other:  None. Musculoskeletal:  No suspicious bone lesions identified. IMPRESSION: Partial staghorn calculus in right renal collecting system, with mild to moderate pelvicaliectasis. Internal right ureteral stent, with hemorrhage within several lower pole calices in the right renal collecting system. No evidence of perinephric or retroperitoneal hemorrhage. Electronically Signed   By: Earle Gell M.D.   On: 11/04/2016 08:58   Dg C-arm 1-60 Min  Result Date: 11/03/2016 CLINICAL DATA:  Right ureteral stent placement. EXAM: DG C-ARM 61-120 MIN FLUOROSCOPY TIME:  48 seconds. COMPARISON:  Ultrasound of October 26, 2016. FINDINGS: Ten fluoroscopic images were obtained during cystoscopy. These images demonstrate large staghorn type calculus in right kidney. Contrast was injected into the ureter. Catheter  tip is seen in the right renal pelvis. IMPRESSION: Large right-sided staghorn calculus is noted. Electronically Signed   By: Marijo Conception, M.D.   On: 11/03/2016 21:28   US Abdomen Limited Ruq  Result Date: 10/26/2016 CLINICAL DATA:  Right upper quadrant abdominal and back pain for 4 days with nausea and vomiting. EXAM: ULTRASOUND ABDOMEN LIMITED RIGHT UPPER QUADRANT COMPARISON:  01/13/2009 CT abdomen/pelvis. FINDINGS: Gallbladder: No gallstones or wall thickening visualized. No sonographic Murphy sign noted by sonographer. Common bile duct: Diameter: 3 mm Liver: No focal lesion identified. Within normal limits in parenchymal echogenicity. Incidentally noted is mild to moderate right hydronephrosis with multiple shadowing mid to lower right renal stones, with the largest visualized right renal stone measuring 8 mm. Echogenic right kidney. IMPRESSION: 1. Incidental mild-to-moderate right hydronephrosis with right nephrolithiasis. Findings are suggestive of an obstructing right urinary tract stone. Consider correlation with unenhanced CT abdomen/pelvis as clinically warranted. 2. Right kidney is echogenic indicating nonspecific right renal parenchymal disease, probably due to acute right urinary tract obstruction. 3. Otherwise normal right upper quadrant abdominal sonogram, with no cholelithiasis. Electronically Signed   By: Ilona Sorrel M.D.   On: 10/26/2016 15:01    Labs:  CBC:  Recent Labs  10/26/16 1315 11/03/16 2133 11/04/16 0458  WBC 4.5 8.0 5.3  HGB 13.9 13.0 10.7*  HCT 40.2 37.2 31.6*  PLT 52* 181 177    COAGS:  Recent Labs  11/07/16 1056  INR 1.27  APTT 34    BMP:  Recent Labs  10/26/16 1315 11/03/16 1721 11/04/16 0458 11/06/16 0505  NA 134* 135 133*  --   K 3.8 4.1 3.4*  --   CL 101 98* 103  --   CO2 24 23 22   --   GLUCOSE 82 91 117*  --   BUN 10 15 12   --   CALCIUM 9.5 9.2 7.9*  --   CREATININE 0.90 1.05* 0.91 0.85  GFRNONAA >60 >60 >60 >60  GFRAA >60 >60  >60 >60    LIVER FUNCTION TESTS:  Recent Labs  10/26/16 1315 11/03/16 1721  BILITOT 0.8 0.9  AST 31 36  ALT 32 25  ALKPHOS 123 97  PROT 9.4* 9.6*  ALBUMIN 4.4 3.9    TUMOR MARKERS: No results for input(s): AFPTM, CEA, CA199, CHROMGRNA in the last 8760 hours.  Assessment and Plan: Patient with past medical history of HIV/AID presents with complaint of right kidney stone with suspected incomplete drainage from right urinary system after recent stent placement.  IR consulted for percutaneous right nephrostomy tube placement at the request of Dr. Karsten Ro. Case reviewed by Dr. Pascal Lux who approves patient for procedure.  Patient presents today in their usual state of health.  She has been NPO and is not currently on blood thinners.  She is currently on Vanc and rocephin; last dosed this AM. Risks and benefits discussed with the patient including, but not limited to infection, bleeding, significant bleeding causing loss or decrease in renal function or damage to adjacent structures.  All of the patient's questions were answered, patient is agreeable to proceed. Consent signed and in chart.   Thank you for this interesting consult.  I greatly enjoyed meeting [place] and look forward to participating in their care.  A copy of this report was sent to the requesting provider on this date.  Electronically Signed: Docia Barrier, PA 11/07/2016, 1:24 PM   I spent a total of 40 Minutes    in face to face in clinical consultation, greater than 50% of which was counseling/coordinating care for right hydronephrosis.

## 2016-11-07 NOTE — Procedures (Signed)
Pre Procedure Dx: Persistent Hydronephrosis and fevers despite placement of a ureteral stent. Post Procedural Dx: Same  Successful [place] and fluoroscopic guided placement of a right sided PCN with end coiled and locked within a dilated and dysmorphic appearing inferior pole calyx. PCN connected to gravity bag.  EBL: Minimal  Complications: None immediate.  Ronny Bacon, MD Pager #: [phone]

## 2016-11-07 NOTE — Sedation Documentation (Signed)
Patient is resting comfortably. 

## 2016-11-07 NOTE — Sedation Documentation (Signed)
Pt grimacing pain med given

## 2016-11-08 ENCOUNTER — Encounter (HOSPITAL_COMMUNITY): Payer: Self-pay | Admitting: Interventional Radiology

## 2016-11-08 LAB — CBC WITH DIFFERENTIAL/PLATELET
Basophils Absolute: 0 10*3/uL (ref 0.0–0.1)
Basophils Relative: 1 %
Eosinophils Absolute: 0 10*3/uL (ref 0.0–0.7)
Eosinophils Relative: 1 %
HCT: 27 % — ABNORMAL LOW (ref 36.0–46.0)
Hemoglobin: 9.2 g/dL — ABNORMAL LOW (ref 12.0–15.0)
Lymphocytes Relative: 33 %
Lymphs Abs: 1.6 10*3/uL (ref 0.7–4.0)
MCH: 27.7 pg (ref 26.0–34.0)
MCHC: 34.1 g/dL (ref 30.0–36.0)
MCV: 81.3 fL (ref 78.0–100.0)
Monocytes Absolute: 0.3 10*3/uL (ref 0.1–1.0)
Monocytes Relative: 7 %
Neutro Abs: 2.8 10*3/uL (ref 1.7–7.7)
Neutrophils Relative %: 58 %
Platelets: 221 10*3/uL (ref 150–400)
RBC: 3.32 MIL/uL — ABNORMAL LOW (ref 3.87–5.11)
RDW: 13.1 % (ref 11.5–15.5)
WBC: 4.8 10*3/uL (ref 4.0–10.5)

## 2016-11-08 LAB — HIV-1 RNA QUANT-NO REFLEX-BLD
HIV 1 RNA Quant: 17200 copies/mL
LOG10 HIV-1 RNA: 4.236 log10copy/mL

## 2016-11-08 LAB — T-HELPER CELLS (CD4) COUNT (NOT AT ARMC)
CD4 T CELL ABS: 70 /uL — AB (ref 400–2700)
CD4 T CELL HELPER: 4 % — AB (ref 33–55)

## 2016-11-08 MED ORDER — SENNA 8.6 MG PO TABS
2.0000 | ORAL_TABLET | Freq: Every day | ORAL | Status: DC
  Filled 2016-11-08 (×2): qty 2

## 2016-11-08 MED ORDER — SULFAMETHOXAZOLE-TRIMETHOPRIM 400-80 MG PO TABS
1.0000 | ORAL_TABLET | Freq: Every day | ORAL | Status: DC
  Administered 2016-11-09 – 2016-11-10 (×2): 1 via ORAL
  Filled 2016-11-08 (×2): qty 1

## 2016-11-08 MED ORDER — BISACODYL 10 MG RE SUPP: 10.0000 mg | Freq: Every day | RECTAL | Status: DC | PRN

## 2016-11-08 MED ORDER — DOCUSATE SODIUM 100 MG PO CAPS
200.0000 mg | ORAL_CAPSULE | Freq: Two times a day (BID) | ORAL | Status: DC
  Administered 2016-11-08 – 2016-11-10 (×3): 200 mg via ORAL
  Filled 2016-11-08 (×5): qty 2

## 2016-11-08 MED ORDER — BELLADONNA ALKALOIDS-OPIUM 16.2-60 MG RE SUPP: 1.0000 | Freq: Three times a day (TID) | RECTAL | Status: DC | PRN

## 2016-11-08 MED ORDER — DEXTROSE 5 % IV SOLN
1.0000 g | Freq: Three times a day (TID) | INTRAVENOUS | Status: DC
  Administered 2016-11-08 – 2016-11-10 (×6): 1 g via INTRAVENOUS
  Filled 2016-11-08 (×7): qty 1

## 2016-11-08 MED ORDER — MAGNESIUM CITRATE PO SOLN
1.0000 | Freq: Once | ORAL | Status: AC
  Administered 2016-11-08: 1 via ORAL
  Filled 2016-11-08: qty 296

## 2016-11-08 NOTE — Progress Notes (Deleted)
Patient ID: Kimberly Bishop, female   DOB: [birth year], 26 y.o.   MRN: [id number] 5 Days Post-Op  Assessment: 1.  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  F/u CT poor drainage of her lower pole. Given she was continuing to spike fevers, she underwent R lower pole PCN placement on 11/09/16.  2.  Right staghorn calculus.  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  3. HIV: Consulted ID for recommendations regarging timing of PCNL given previously low CD4 count. Will also request their assistance for choice of antibiotics.  Plan: 1 - Continue current antibiotics. - Consult ID - Bowel regimen - Continue R PCN and Foley to drainage. Foley out after 24 hours without fever.   Subjective: Patient reports discomfort from PCN.   Objective: Vital signs in last 24 hours: Temp:  [97.6 F (36.4 C)-102.6 F (39.2 C)] 97.6 F (36.4 C) (08/28 0700) Pulse Rate:  [85-120] 107 (08/28 0443) Resp:  [16-29] 20 (08/28 0443) BP: (101-148)/(60-87) 114/60 (08/28 0443) SpO2:  [92 %-100 %] 95 % (08/28 0443)  Intake/Output from previous day: 08/27 0701 - 08/28 0700 In: 1010 [P.O.:360; I.V.:650] Out: 1476 [Urine:1476] Intake/Output this shift: No intake/output data recorded.  Past Medical History:  Diagnosis Date  . AIDS (acquired immunodeficiency syndrome), CD4 <=200 ([place])   . HIV (human immunodeficiency virus infection) ([place])   . Immune deficiency disorder ([place])    Current Facility-Administered Medications  Medication Dose Route Frequency Provider Last Rate Last Dose  . acetaminophen (TYLENOL) tablet 650 mg  650 mg Oral Q4H PRN Kathie Rhodes, MD   650 mg at 11/08/16 0444  . bisacodyl (DULCOLAX) suppository 10 mg  10 mg Rectal Daily PRN Stasia Cavalier, MD      . cefTRIAXone (ROCEPHIN) 2 g in dextrose 5 % 50 mL IVPB  2 g Intravenous Q24H Kathie Rhodes, MD   Stopped at 11/07/16 1927  . diphenhydrAMINE (BENADRYL) capsule 25 mg  25 mg Oral Q6H PRN Filippou, Braxton Feathers, MD      . docusate sodium (COLACE) capsule 200 mg  200 mg Oral BID Stasia Cavalier, MD      . dolutegravir (TIVICAY) tablet 50 mg  50 mg Oral Daily Filippou, Braxton Feathers, MD   50 mg at 11/07/16 2002  . emtricitabine-rilpivir-tenofovir AF (ODEFSEY) 200-25-25 MG per tablet 1 tablet  1 tablet Oral Q breakfast Filippou, Braxton Feathers, MD   1 tablet at 11/08/16 0758  . HYDROmorphone (DILAUDID) injection 0.5-1 mg  0.5-1 mg Intravenous Q2H PRN Kathie Rhodes, MD   0.5 mg at 11/08/16 0759  . iopamidol (ISOVUE-300) 61 % injection 20 mL  20 mL Other Once PRN Sandi Mariscal, MD      . magnesium citrate solution 1 Bottle  1 Bottle Oral Once Stasia Cavalier, MD      . ondansetron [facility]) injection 4 mg  4 mg Intravenous Q4H PRN Kathie Rhodes, MD   4 mg at 11/07/16 1035  . opium-belladonna (B&O SUPPRETTES) 16.2-60 MG suppository 1 suppository  1 suppository Rectal Q8H PRN Stasia Cavalier, MD      . oxybutynin (DITROPAN) tablet 5 mg  5 mg Oral TID Filippou, Braxton Feathers, MD   5 mg at 11/07/16 1906  . oxyCODONE (Oxy IR/ROXICODONE) immediate release  tablet 5 mg  5 mg Oral Q4H PRN Kathie Rhodes, MD   5 mg at 11/06/16 0902  . phenol (CHLORASEPTIC) mouth spray 1 spray  1 spray Mouth/Throat PRN Kathie Rhodes, MD      . senna (SENOKOT) tablet 17.2 mg  2 tablet Oral QHS Stasia Cavalier, MD      . sodium chloride flush (NS) 0.9 % injection 5 mL  5 mL Intravenous Lovena Neighbours, MD      . vancomycin (VANCOCIN) IVPB 1000 mg/200 mL premix  1,000 mg Intravenous Q12H Adrian Saran, RPH 200 mL/hr at 11/08/16 0759 1,000 mg at 11/08/16 0759    Physical Exam:  General: Patient is in no apparent distress Lungs: Normal respiratory effort, chest expands symmetrically. GI: The abdomen is soft  and nontender without mass. Back: R PCN draining clear yellow urine.    Lab Results:  Recent Labs  11/08/16 0440  WBC 4.8  HGB 9.2*  HCT 27.0*   BMET  Recent Labs  11/06/16 0505  CREATININE 0.85    Recent Labs  11/07/16 1056  INR 1.27   No results for input(s): LABURIN in the last 72 hours. Results for orders placed or performed during the hospital encounter of 11/03/16  Culture, blood (routine x 2)     Status: None (Preliminary result)   Collection Time: 11/04/16  8:26 AM  Result Value Ref Range Status   Specimen Description BLOOD LEFT ARM  Final   Special Requests IN PEDIATRIC BOTTLE Blood Culture adequate volume  Final   Culture   Final    NO GROWTH 3 DAYS Performed at [facility], [place] [address], [place], [place] [address]    Report Status PENDING  Incomplete  Culture, blood (routine x 2)     Status: None (Preliminary result)   Collection Time: 11/04/16  8:26 AM  Result Value Ref Range Status   Specimen Description BLOOD LEFT HAND  Final   Special Requests   Final    BOTTLES DRAWN AEROBIC ONLY Blood Culture adequate volume   Culture   Final    NO GROWTH 3 DAYS Performed at [facility], [place] [address], [place], [place] [address]    Report Status PENDING  Incomplete    Studies/Results: No results found.     Stasia Cavalier 11/08/2016, 8:10 AM

## 2016-11-08 NOTE — Progress Notes (Signed)
Pharmacy Antibiotic Note  Kimberly Bishop is a 26 y.o. female  C/o nausea, vomiting and epigastric pain admitted on 11/03/2016 with bacteruria  Pharmacy had been consulted for vancomycin dosing on 8/24  11/08/2016 WBC 4.8 Scr 0.85, CrCl ~ 53mls/min Febrile 102.6  Plan: - Continue Rocephin 2 Gm IV q24h (MD) - check vanc trough tonight, currently on 1gm IV q12h - f/u ID recs for abx  Height: 5\' 2"  (157.5 cm) Weight: 110 lb 6 oz (50.1 kg) IBW/kg (Calculated) : 50.1  Temp (24hrs), Avg:99.2 F (37.3 C), Min:97.6 F (36.4 C), Max:102.6 F (39.2 C)   Recent Labs Lab 11/03/16 1721 11/03/16 2133 11/04/16 0458 11/06/16 0505 11/06/16 1525 11/08/16 0440  WBC  --  8.0 5.3  --   --  4.8  CREATININE 1.05*  --  0.91 0.85  --   --   VANCOTROUGH  --   --   --   --  8*  --     Estimated Creatinine Clearance: 80 mL/min (by C-G formula based on SCr of 0.85 mg/dL).    No Known Allergies  Antimicrobials this admission: 8/23 rocephin >>  8/23 vancomycin >>   Dose adjustments: 8/26 @ 1525 vanc trough=8 on 750mg  IV q12h  Thank you for allowing pharmacy to be a part of this patient's care.   Dolly Rias RPh 11/08/2016, 12:49 PM Pager [phone]

## 2016-11-08 NOTE — Progress Notes (Signed)
Patient ID: Kimberly Bishop, female   DOB: 06-23-1990, 26 y.o.   MRN: [id number] 5 Days Post-Op  Assessment: 1.  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  F/u CT poor drainage of her lower pole. Given she was continuing to spike fevers, she underwent R lower pole PCN placement on 11/09/16.  2.  Right staghorn calculus.  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  3. HIV: Consulted ID for recommendations regarging timing of PCNL given previously low CD4 count. Will also request their assistance for choice of antibiotics.  Addendum: I spoke with the interventional radiologist who placed her nephrostomy tube yesterday.  He told me that he had a difficult time accessing an area of the kidney that actually produced drainage.  When he was able to access the lower pole the material that returned was very thick and grossly purulent.  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 We discussed the further options for evaluation of this situation and he and I both agreed that once her kidney has "cool down" for a few days we should then evaluate her renal function with a renogram in order to determine the percent function of this kidney.  If she continues to spike fever despite all of the drainage that has been obtained she may require an urgent nephrectomy.  Plan: 1 - Continue current antibiotics. - Consult ID - Bowel regimen - Continue R PCN to drainage.  Subjective: Patient  reports discomfort from PCN.   Objective: Vital signs in last 24 hours: Temp:  [97.6 F (36.4 C)-102.6 F (39.2 C)] 97.6 F (36.4 C) (08/28 0700) Pulse Rate:  [85-120] 107 (08/28 0443) Resp:  [16-29] 20 (08/28 0443) BP: (101-148)/(60-87) 114/60 (08/28 0443) SpO2:  [92 %-100 %] 95 % (08/28 0443)  Intake/Output from previous day: 08/27 0701 - 08/28 0700 In: 1010 [P.O.:360; I.V.:650] Out: 1476 [Urine:1476] Intake/Output this shift: No intake/output data recorded.  Past Medical History:  Diagnosis Date  . 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 HIV (human immunodeficiency virus infection) ([place])   . Immune deficiency disorder ([place])    Current Facility-Administered Medications  Medication Dose Route Frequency Provider Last Rate Last Dose  . acetaminophen (TYLENOL) tablet 650 mg  650 mg Oral Q4H PRN Kathie Rhodes, MD   650 mg at 11/08/16 0444  . bisacodyl (DULCOLAX) suppository 10 mg  10 mg Rectal Daily PRN Stasia Cavalier, MD      . cefTRIAXone (ROCEPHIN) 2 g in dextrose 5 % 50 mL IVPB  2 g Intravenous Q24H Kathie Rhodes, MD   Stopped at 11/07/16 1927  . diphenhydrAMINE (BENADRYL) capsule 25 mg  25 mg Oral Q6H PRN Filippou, Braxton Feathers, MD      . docusate sodium (COLACE) capsule 200 mg  200 mg Oral BID Stasia Cavalier, MD      . dolutegravir (TIVICAY) tablet 50 mg  50 mg Oral Daily Filippou, Braxton Feathers, MD  50 mg at 11/07/16 2002  . emtricitabine-rilpivir-tenofovir AF (ODEFSEY) 200-25-25 MG per tablet 1 tablet  1 tablet Oral Q breakfast Filippou, Braxton Feathers, MD   1 tablet at 11/08/16 0758  . HYDROmorphone (DILAUDID) injection 0.5-1 mg  0.5-1 mg Intravenous Q2H PRN Kathie Rhodes, MD   0.5 mg at 11/08/16 0759  . iopamidol (ISOVUE-300) 61 % injection 20 mL  20 mL Other Once PRN Sandi Mariscal, MD      . magnesium citrate solution 1 Bottle  1 Bottle Oral Once Stasia Cavalier, MD      . ondansetron [facility]) injection 4 mg  4 mg Intravenous Q4H PRN  Kathie Rhodes, MD   4 mg at 11/07/16 1035  . opium-belladonna (B&O SUPPRETTES) 16.2-60 MG suppository 1 suppository  1 suppository Rectal Q8H PRN Stasia Cavalier, MD      . oxybutynin (DITROPAN) tablet 5 mg  5 mg Oral TID Filippou, Braxton Feathers, MD   5 mg at 11/07/16 1906  . oxyCODONE (Oxy IR/ROXICODONE) immediate release tablet 5 mg  5 mg Oral Q4H PRN Kathie Rhodes, MD   5 mg at 11/06/16 0902  . phenol (CHLORASEPTIC) mouth spray 1 spray  1 spray Mouth/Throat PRN Kathie Rhodes, MD      . senna (SENOKOT) tablet 17.2 mg  2 tablet Oral QHS Stasia Cavalier, MD      . sodium chloride flush (NS) 0.9 % injection 5 mL  5 mL Intravenous Lovena Neighbours, MD      . vancomycin (VANCOCIN) IVPB 1000 mg/200 mL premix  1,000 mg Intravenous Q12H Adrian Saran, RPH 200 mL/hr at 11/08/16 0759 1,000 mg at 11/08/16 0759    Physical Exam:  General: Patient is in no apparent distress Lungs: Normal respiratory effort, chest expands symmetrically. GI: The abdomen is soft and nontender without mass. Back: R PCN draining clear yellow urine.    Lab Results:  Recent Labs  11/08/16 0440  WBC 4.8  HGB 9.2*  HCT 27.0*   BMET  Recent Labs  11/06/16 0505  CREATININE 0.85    Recent Labs  11/07/16 1056  INR 1.27   No results for input(s): LABURIN in the last 72 hours. Results for orders placed or performed during the hospital encounter of 11/03/16  Culture, blood (routine x 2)     Status: None (Preliminary result)   Collection Time: 11/04/16  8:26 AM  Result Value Ref Range Status   Specimen Description BLOOD LEFT ARM  Final   Special Requests IN PEDIATRIC BOTTLE Blood Culture adequate volume  Final   Culture   Final    NO GROWTH 3 DAYS Performed at [facility], [place] [address], [place], [place] [address]    Report Status PENDING  Incomplete  Culture, blood (routine x 2)     Status: None (Preliminary result)   Collection Time: 11/04/16  8:26 AM  Result Value Ref Range Status    Specimen Description BLOOD LEFT HAND  Final   Special Requests   Final    BOTTLES DRAWN AEROBIC ONLY Blood Culture adequate volume   Culture   Final    NO GROWTH 3 DAYS Performed at [facility], [place] [address] [place], [place] [address]    Report Status PENDING  Incomplete    Studies/Results: No results found.     Stasia Cavalier 11/08/2016, 8:15 AM  Patient was seen, examined,treatment plan was discussed with the resident.  I have directly reviewed the clinical findings, lab,  imaging studies and management of this patient in detail. I have made the necessary changes and/or additions to the above noted documentation, and agree with the documentation, as recorded by the resident.

## 2016-11-08 NOTE — Progress Notes (Signed)
Pharmacy Antibiotic Note  Kimberly Bishop is a 26 y.o. female  C/o nausea, vomiting and epigastric pain admitted on 11/03/2016 with bacteruria  Pharmacy had been consulted for vancomycin dosing on 8/24 and cefepime on 8/28 for pyelonephritis.   11/08/2016 WBC 4.8 Scr 0.85, CrCl ~ 78mls/min Febrile 101.9  Plan: - DC rocephin and vancomycin - start Cefepime 1 gm IV q8h for pyelonephritis - Bactrim SS 1 tab qday for PCP px  Height: 5\' 2"  (157.5 cm) Weight: 110 lb 6 oz (50.1 kg) IBW/kg (Calculated) : 50.1  Temp (24hrs), Avg:99.2 F (37.3 C), Min:97.6 F (36.4 C), Max:102.6 F (39.2 C)   Recent Labs Lab 11/03/16 1721 11/03/16 2133 11/04/16 0458 11/06/16 0505 11/06/16 1525 11/08/16 0440  WBC  --  8.0 5.3  --   --  4.8  CREATININE 1.05*  --  0.91 0.85  --   --   VANCOTROUGH  --   --   --   --  8*  --     Estimated Creatinine Clearance: 80 mL/min (by C-G formula based on SCr of 0.85 mg/dL).    No Known Allergies  Antimicrobials this admission: 8/23 rocephin >> 8/28 8/23 vancomycin >> 8/28 8/28 cefepime >> 8/28 bactrim for PCP px>> Dose adjustments: 8/26 @ 1525 vanc trough=8 on 750mg  IV q12h   Microbiology results: 8/24 BCx: ngtd  Thank you for allowing pharmacy to be a part of this patient's care.  Eudelia Bunch, Pharm.D. [phone] 11/08/2016 4:36 PM

## 2016-11-08 NOTE — Consult Note (Addendum)
[facility] for Infectious Disease  Total days of antibiotics 6        Day 6 vanco/cetriaxone         Reason for Consult: fevers of unknown origin   Referring Physician: ottelin  Active Problems:   Sepsis ([place])    HPI: Kimberly Bishop is a 26 y.o. female with advanced perinatally acquired hiv disease, CD 4 count of 110(11%)/VL 1,809 (jan 2018) currently on tivicay/odefsey which she has been taking consistently since June 2018. She last seen by Dr Linus Salmons in Jan 2018, was seen in the ED on 8/15 for N/V/epigastric pain but found to have hydroureterophrosis plus several renal stones. On follow up at Urology clinic on 8/23, she reported ongoing right flank pain, N/V/F, chills at home. She was admitted for evaluation of kidney stones, possible pyelonephritis. Imaging found to have staghorn calculus in right kidney. She was placed on ceftriaxone and vancomycin but still having fevers. She had nephrostomy tube yesterday which was difficult to place due to thick gross purulent material, but no cultures were sent. Ultimately, urology would like to do percutaneous nephrolithotomy to treat staghorn calculi as an outpatient. Patient having fevers up to 103F but she reports being asymptomatic from fever, chills but does states that the PCN is still painful in the area.  Past Medical History:  Diagnosis Date  . 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 HIV (human immunodeficiency virus infection) ([place])   . Immune deficiency disorder ([facility])     Allergies: No Known Allergies  MEDICATIONS: . docusate sodium  200 mg Oral BID  . dolutegravir  50 mg Oral Daily  . emtricitabine-rilpivir-tenofovir AF  1 tablet Oral Q breakfast  . oxybutynin  5 mg Oral TID  . senna  2 tablet Oral QHS  . sodium chloride flush  5 mL Intravenous Q8H    Social History  Substance Use Topics  . Smoking status: Never Smoker  . Smokeless tobacco: Never Used  . Alcohol use No     Comment: occasional     Family History  Problem Relation Age of Onset  . Adopted: Yes  . Hypertension Mother      Review of Systems  Constitutional: Negative for fever, chills, diaphoresis, activity change, appetite change, fatigue and unexpected weight change.  HENT: Negative for congestion, sore throat, rhinorrhea, sneezing, trouble swallowing and sinus pressure.  Eyes: Negative for photophobia and visual disturbance.  Respiratory: Negative for cough, chest tightness, shortness of breath, wheezing and stridor.  Cardiovascular: Negative for chest pain, palpitations and leg swelling.  Gastrointestinal: Negative for nausea, vomiting, abdominal pain, diarrhea, constipation, blood in stool, abdominal distention and anal bleeding.  Genitourinary: Negative for dysuria, hematuria, flank pain and difficulty urinating.  Musculoskeletal: +right flank pain at PCN site Skin: + eczema to right cheek Neurological: Negative for dizziness, tremors, weakness and light-headedness.  Hematological: Negative for adenopathy. Does not bruise/bleed easily.  Psychiatric/Behavioral: Negative for behavioral problems, confusion, sleep disturbance, dysphoric mood, decreased concentration and agitation.     OBJECTIVE: Temp:  [97.6 F (36.4 C)-102.6 F (39.2 C)] 98.2 F (36.8 C) (08/28 1413) Pulse Rate:  [85-120] 116 (08/28 1413) Resp:  [16-29] 20 (08/28 1413) BP: (101-148)/(60-90) 119/90 (08/28 1413) SpO2:  [92 %-100 %] 96 % (08/28 1413) Physical Exam  Constitutional:  oriented to person, place, and time. appears well-developed and well-nourished. No distress.  HENT: [place]/AT, PERRLA, no scleral icterus, hyperpigment flaky macule to right cheek Mouth/Throat: Oropharynx is clear and moist. No  oropharyngeal exudate.  Cardiovascular: Normal rate, regular rhythm and normal heart sounds. Exam reveals no gallop and no friction rub.  No murmur heard.  Pulmonary/Chest: Effort normal and breath sounds normal. No respiratory distress.   has no wheezes.  Neck = supple, no nuchal rigidity Abdominal: Soft. Bowel sounds are normal.  exhibits no distension. There is no tenderness.  Back: right PCN in place tender to touch Lymphadenopathy: no cervical adenopathy. No axillary adenopathy Neurological: alert and oriented to person, place, and time.  Skin: Skin is warm and dry. No rash noted. No erythema. Per HEENT exam Psychiatric: a normal mood and affect.  behavior is normal.   LABS: Results for orders placed or performed during the hospital encounter of 11/03/16 (from the past 48 hour(s))  Protime-INR     Status: Abnormal   Collection Time: 11/07/16 10:56 AM  Result Value Ref Range   Prothrombin Time 16.0 (H) 11.4 - 15.2 seconds   INR 1.27   APTT     Status: None   Collection Time: 11/07/16 10:56 AM  Result Value Ref Range   aPTT 34 24 - 36 seconds  T-helper cells (CD4) count (not at [facility])     Status: Abnormal   Collection Time: 11/07/16  1:50 PM  Result Value Ref Range   CD4 T Cell Abs 70 (L) 400 - 2,700 /uL   CD4 % Helper T Cell 4 (L) 33 - 55 %  CBC with Differential/Platelet     Status: Abnormal   Collection Time: 11/08/16  4:40 AM  Result Value Ref Range   WBC 4.8 4.0 - 10.5 K/uL   RBC 3.32 (L) 3.87 - 5.11 MIL/uL   Hemoglobin 9.2 (L) 12.0 - 15.0 g/dL   HCT 27.0 (L) 36.0 - 46.0 %   MCV 81.3 78.0 - 100.0 fL   MCH 27.7 26.0 - 34.0 pg   MCHC 34.1 30.0 - 36.0 g/dL   RDW 13.1 11.5 - 15.5 %   Platelets 221 150 - 400 K/uL   Neutrophils Relative % 58 %   Neutro Abs 2.8 1.7 - 7.7 K/uL   Lymphocytes Relative 33 %   Lymphs Abs 1.6 0.7 - 4.0 K/uL   Monocytes Relative 7 %   Monocytes Absolute 0.3 0.1 - 1.0 K/uL   Eosinophils Relative 1 %   Eosinophils Absolute 0.0 0.0 - 0.7 K/uL   Basophils Relative 1 %   Basophils Absolute 0.0 0.0 - 0.1 K/uL    MICRO:  IMAGING: Ir Nephrostogram Right Initial Placement  Result Date: 11/08/2016 INDICATION: History of staghorn calculus with sepsis syndromes, post placement of a  right-sided double-J ureteral stent with persistent fevers. Request made for placement of a percutaneous nephrostomy catheter via a posterior inferior calyx to serve as both infectious source control as well as access for future PNL procedure. EXAM: 1. ULTRASOUND GUIDANCE FOR PUNCTURE OF THE RIGHT RENAL COLLECTING SYSTEM 2. RIGHT PERCUTANEOUS NEPHROSTOMY TUBE PLACEMENT. COMPARISON:  CT abdomen pelvis - 11/04/2016; renal ultrasound - 10/26/2016; fluoroscopic guided images during right-sided double-J ureteral stent placement - 11/03/2016 MEDICATIONS: The patient is currently admitted to the hospital and receiving intravenous antibiotics. The antibiotic was administered in an appropriate time frame prior to skin puncture. ANESTHESIA/SEDATION: Moderate (conscious) sedation was employed during this procedure. A total of Versed 4 mg and Fentanyl 200 mcg was administered intravenously. Moderate Sedation Time: 55 minutes. The patient's level of consciousness and vital signs were monitored continuously by radiology nursing throughout the procedure under my direct supervision. CONTRAST:  20 mL Isovue 300 administered into the collecting system FLUOROSCOPY TIME:  19 minutes 6 seconds (116 mGy). COMPLICATIONS: None immediate. PROCEDURE: The procedure, risks, benefits, and alternatives were explained to the patient. Questions regarding the procedure were encouraged and answered. The patient understands and consents to the procedure. A timeout was performed prior to the initiation of the procedure. The right flank region was prepped with Betadine in a sterile fashion, and a sterile drape was applied covering the operative field. A sterile gown and sterile gloves were used for the procedure. Local anesthesia was provided with 1% Lidocaine with epinephrine. Ultrasound was used to localize the right kidney. Under direct ultrasound guidance, a 21 gauge needle was advanced into the renal collecting system. An ultrasound image  documentation was performed. Access within the collecting system was confirmed with the efflux of grossly purulent appearing urine as well as limited contrast injection. Despite prolonged efforts, an 0.018 Nitrex wire could not be advanced beyond the staghorn calculus. As such, a separate stone containing calyx within the mid/inferior pole of the right kidney was targeted with ultrasound guidance. Again, access to the collecting system was confirmed with the efflux of grossly purulent appearing urine as well as limited contrast injection. Again, despite prolonged efforts, an 0.018 Nitrex wire could not be advanced beyond the staghorn calculus calculus. As such, the decision was made at this time to coil a nephrostomy catheter within the dilated dysmorphic portion of the inferior aspect of the right renal pelvis/collecting system. Over a Nitrex wire, the tract was dilated with an Accustick stent. Over a guide wire, a 10-French percutaneous nephrostomy catheter was advanced into the collecting system where the coil was formed and locked. Contrast was injected and several sport radiographs were obtained in various obliquities confirming access. The catheter was secured at the skin with a Prolene retention suture and a gravity bag was placed. A dressing was placed. The patient tolerated procedure well without immediate postprocedural complication. FINDINGS: Ultrasound scanning demonstrates a moderate to severely dilated right renal collecting system. A posterior inferior calyx was initially accessed with contrast injection demonstrated marked dysmorphic appearance of the inferior left renal collecting system. Despite prolonged efforts including access of an additional stone continuing calyx, a wire could not be advanced beyond the staghorn calculus. Ultimately a 10 French percutaneous nephrostomy catheter was coiled within the inferior aspect of the right renal pelvis/renal collecting system. Contrast injection  confirmed appropriate positioning. IMPRESSION: Successful ultrasound and fluoroscopic guided placement of a right sided 10 French PCN, coiled within the inferior aspect of the right renal pelvis / renal collecting system. Note, grossly purulent urine was aspirated following access to the markedly dysmorphic appearing right renal collecting system. This finding questions the viability of the mid and inferior poles of the right kidney (potential XGP appearance). Renal function could be further evaluated with renal scintigraphy as clinically indicated. Above findings discussed with Dr. Karsten Ro at 10:45 on 11/08/16. Electronically Signed   By: Sandi Mariscal M.D.   On: 11/08/2016 10:42    HISTORICAL MICRO/IMAGING  Assessment/Plan: 26yo F with pyelonephritis and staghorn calculi to right kidney  S/p R PCN still having fevers, concern for source control   hiv disease = continue on current regimen. Will add bactrim for pcp prophylaxis.Will also repeat afb cultures to see if it is not related to pyelo and more due to oi. Will check cd 4 count and viral load. Cd 4 count will likely be artificially lower in the setting of acute infection  Fevers =  likely due to insufficient source control, however her last fever was yesterday. Will check urine culture. Will expand abtx to cefepime for now and await urine cx results.  Will repeat blood cx. If fever curve do not improve after changing abtx consider further definitive management of staghorn calculi. Will d/c vanco  Dermatitis = has appears of eczema. Will give steroid cream to see if it helps

## 2016-11-09 LAB — CULTURE, BLOOD (ROUTINE X 2)
Culture: NO GROWTH
Culture: NO GROWTH
Special Requests: ADEQUATE
Special Requests: ADEQUATE

## 2016-11-09 MED ORDER — TRIAMCINOLONE 0.1 % CREAM:EUCERIN CREAM 1:1
TOPICAL_CREAM | Freq: Two times a day (BID) | CUTANEOUS | Status: DC
  Administered 2016-11-09 – 2016-11-10 (×2): via TOPICAL
  Filled 2016-11-09: qty 1

## 2016-11-09 NOTE — Progress Notes (Signed)
Patient ID: Kimberly Bishop, female   DOB: Nov 28, 1990, 26 y.o.   MRN: [id number] 6 Days Post-Op  Assessment: 1.  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  F/u CT poor drainage of her lower pole. Given she was continuing to spike fevers, she underwent R lower pole PCN placement on 11/09/16. 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 Will consider lasix scan to eval function if UOP drops. May require nephrectomy if continues to spike fevers despite adequate drainage and antibiotic coverage. Will stay the course for now.   2.  Right staghorn calculus.  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   3. HIV: Consulted ID for recommendations regarging timing of PCNL given previously low CD4 count. Per their recommendation, we have switched her antibiotics to Cefepime, and ordered repeat cultures.    Plan:  - Continue current antibiotics. - Follow up blood and urine cultures - Continue bowel regimen - Continue R PCN to drainage.  Subjective: Patient reports discomfort from PCN.   Objective: Vital signs in last 24 hours: Temp:  [98.2 F (36.8 C)-99.7 F (37.6 C)] 99.6 F (37.6 C) (08/29 0522) Pulse Rate:  [96-116] 96 (08/29 0522) Resp:  [16-20] 16 (08/29 0522) BP: (105-119)/(57-90) 105/57 (08/29 0522) SpO2:  [95 %-99 %] 95 % (08/29 0522)  Intake/Output from previous day: 08/28 0701 - 08/29 0700 In: -  Out: 1150 [Urine:1150] Intake/Output this shift: Total I/O In: -  Out: 300 [Urine:300]  Past Medical History:  Diagnosis Date  . 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 HIV (human immunodeficiency virus infection) ([place])   .  Immune deficiency disorder ([place])    Current Facility-Administered Medications  Medication Dose Route Frequency Provider Last Rate Last Dose  . acetaminophen (TYLENOL) tablet 650 mg  650 mg Oral Q4H PRN Kathie Rhodes, MD   650 mg at 11/08/16 0444  . bisacodyl (DULCOLAX) suppository 10 mg  10 mg Rectal Daily PRN Stasia Cavalier, MD      . ceFEPIme (MAXIPIME) 1 g in dextrose 5 % 50 mL IVPB  1 g Intravenous Q8H Kathie Rhodes, MD 100 mL/hr at 11/09/16 1004 1 g at 11/09/16 1004  . diphenhydrAMINE (BENADRYL) capsule 25 mg  25 mg Oral Q6H PRN Filippou, Braxton Feathers, MD      . docusate sodium (COLACE) capsule 200 mg  200 mg Oral BID Stasia Cavalier, MD   200 mg at 11/09/16 0915  . dolutegravir (TIVICAY) tablet 50 mg  50 mg Oral Daily Filippou, Braxton Feathers, MD   50 mg at 11/09/16 0915  . emtricitabine-rilpivir-tenofovir AF (ODEFSEY) 200-25-25 MG per tablet 1 tablet  1 tablet Oral Q breakfast Filippou, Braxton Feathers, MD   1 tablet at 11/09/16 0859  . HYDROmorphone (DILAUDID) injection 0.5-1 mg  0.5-1 mg Intravenous Q2H PRN Kathie Rhodes, MD   0.5 mg at 11/09/16 0440  . iopamidol (ISOVUE-300) 61 % injection 20 mL  20 mL Other Once PRN Sandi Mariscal, MD      . ondansetron [facility]) injection 4 mg  4 mg Intravenous Q4H PRN Kathie Rhodes, MD   4 mg at 11/07/16 1035  . opium-belladonna (B&O SUPPRETTES) 16.2-60 MG suppository 1 suppository  1 suppository Rectal Q8H PRN Stasia Cavalier, MD      . oxybutynin [facility]) tablet 5 mg  5 mg Oral TID Filippou, Braxton Feathers, MD   5 mg at 11/07/16 1906  . oxyCODONE (Oxy IR/ROXICODONE) immediate release tablet 5 mg  5 mg Oral Q4H PRN Kathie Rhodes, MD   5 mg at 11/08/16 2038  . phenol (CHLORASEPTIC) mouth spray 1 spray  1 spray Mouth/Throat PRN Kathie Rhodes, MD      . senna (SENOKOT) tablet 17.2 mg  2 tablet Oral QHS Stasia Cavalier, MD      . sodium chloride flush (NS) 0.9 % injection 5 mL  5 mL Intravenous Lovena Neighbours, MD   5 mL at 11/09/16 0930  .  sulfamethoxazole-trimethoprim (BACTRIM,SEPTRA) 400-80 MG per tablet 1 tablet  1 tablet Oral Daily Carlyle Basques, MD   1 tablet at 11/09/16 0915    Physical Exam:  General: Patient is in no apparent distress Lungs: Normal respiratory effort, chest expands symmetrically. GI: The abdomen is soft and nontender without mass. Back: R PCN draining clear yellow urine.    Lab Results:  Recent Labs  11/08/16 0440  WBC 4.8  HGB 9.2*  HCT 27.0*   BMET No results for input(s): NA, K, CL, CO2, GLUCOSE, BUN, CREATININE, CALCIUM in the last 72 hours.  Recent Labs  11/07/16 1056  INR 1.27   No results for input(s): LABURIN in the last 72 hours. Results for orders placed or performed during the hospital encounter of 11/03/16  Culture, blood (routine x 2)     Status: None   Collection Time: 11/04/16  8:26 AM  Result Value Ref Range Status   Specimen Description BLOOD LEFT ARM  Final   Special Requests IN PEDIATRIC BOTTLE Blood Culture adequate volume  Final   Culture   Final    NO GROWTH 5 DAYS Performed at [facility], [place] [address], [place], [place] [address]    Report Status 11/09/2016 FINAL  Final  Culture, blood (routine x 2)     Status: None   Collection Time: 11/04/16  8:26 AM  Result Value Ref Range Status   Specimen Description BLOOD LEFT HAND  Final   Special Requests   Final    BOTTLES DRAWN AEROBIC ONLY Blood Culture adequate volume   Culture   Final    NO GROWTH 5 DAYS Performed at [facility], [place] [address] [place], [place] [address]    Report Status 11/09/2016 FINAL  Final  Culture, blood (routine x 2)     Status: None (Preliminary result)   Collection Time: 11/08/16  7:22 PM  Result Value Ref Range Status   Specimen Description BLOOD RIGHT ARM  Final   Special Requests IN PEDIATRIC BOTTLE Blood Culture adequate volume  Final   Culture   Final    NO GROWTH < 12 HOURS Performed at [facility], [place] [address], [place], [place] [address]     Report Status PENDING  Incomplete  Culture, blood (routine x 2)     Status: None (Preliminary result)   Collection Time: 11/08/16  7:30 PM  Result Value Ref Range Status   Specimen Description BLOOD RIGHT ARM  Final   Special Requests IN PEDIATRIC BOTTLE Blood Culture adequate volume  Final   Culture   Final    NO GROWTH < 12 HOURS Performed at [facility], [place] [address], [place], [place] [address]  Report Status PENDING  Incomplete    Studies/Results: No results found.  Stasia Cavalier 11/09/2016, 10:47 AM  Patient was seen, examined,treatment plan was discussed with the resident.  I have directly reviewed the clinical findings, lab, imaging studies and management of this patient in detail. I have made the necessary changes and/or additions to the above noted documentation, and agree with the documentation, as recorded by the resident.

## 2016-11-09 NOTE — Care Management Note (Signed)
Case Management Note  Patient Details  Name: Kimberly Bishop MRN: [id number] Date of Birth: [birth year]  Subjective/Objective: 26 y/o f admitted w/Sepsis. From home.POD#2 R PCN to drainage. ID following.                   Action/Plan:d/c plan home.   Expected Discharge Date:                  Expected Discharge Plan:  Home/Self Care  In-House Referral:     Discharge planning Services  CM Consult  Post Acute Care Choice:    Choice offered to:     DME Arranged:    DME Agency:     HH Arranged:    HH Agency:     Status of Service:  In process, will continue to follow  If discussed at Long Length of Stay Meetings, dates discussed:    Additional Comments:  Dessa Phi, RN 11/09/2016, 11:52 AM

## 2016-11-09 NOTE — Progress Notes (Signed)
[facility] for Infectious Disease    Date of Admission:  11/03/2016   Total days of antibiotics 7        Day 2 cefepime           ID: Kimberly Bishop is a 26 y.o. 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 On tivicay/odefsey, admitted for nephrolithiasis,pyelonephritis Active Problems:   Sepsis ([place])    Subjective: Afebrile x 24hr, still having some right sided flank pain from PCN but somewhat better than yesterday Medications:  . docusate sodium  200 mg Oral BID  . dolutegravir  50 mg Oral Daily  . emtricitabine-rilpivir-tenofovir AF  1 tablet Oral Q breakfast  . oxybutynin  5 mg Oral TID  . senna  2 tablet Oral QHS  . sodium chloride flush  5 mL Intravenous Q8H  . sulfamethoxazole-trimethoprim  1 tablet Oral Daily    Objective: Vital signs in last 24 hours: Temp:  [99.2 F (37.3 C)-99.7 F (37.6 C)] 99.2 F (37.3 C) (08/29 1342) Pulse Rate:  [94-103] 94 (08/29 1342) Resp:  [16-18] 17 (08/29 1342) BP: (105-120)/(57-73) 120/70 (08/29 1342) SpO2:  [95 %-99 %] 97 % (08/29 1342)  Physical Exam  Constitutional:  oriented to person, place, and time. appears well-developed and well-nourished. No distress.  HENT: [place]/AT, PERRLA, no scleral icterus Mouth/Throat: Oropharynx is clear and moist. No oropharyngeal exudate.  Cardiovascular: Normal rate, regular rhythm and normal heart sounds. Exam reveals no gallop and no friction rub.  No murmur heard.  Pulmonary/Chest: Effort normal and breath sounds normal. No respiratory distress.  has no wheezes.  Neck = supple, no nuchal rigidity Abdominal: Soft. Bowel sounds are normal.  exhibits no distension. There is no tenderness Back: right PCN with clear urine in bag.  Lymphadenopathy: no cervical adenopathy. No axillary adenopathy Neurological: alert and oriented to person, place, and time.  Skin: Skin is warm and dry. No rash noted. No erythema.  Psychiatric: a normal mood and affect.  behavior  is normal.   Lab Results  Recent Labs  11/08/16 0440  WBC 4.8  HGB 9.2*  HCT 27.0*   Microbiology: Blood cx ngtd Urine cx pending Studies/Results: Ir Nephrostomy Placement Right  Result Date: 11/08/2016 INDICATION: History of staghorn calculus with sepsis syndromes, post placement of a right-sided double-J ureteral stent with persistent fevers. Request made for placement of a percutaneous nephrostomy catheter via a posterior inferior calyx to serve as both infectious source control as well as access for future PNL procedure. EXAM: 1. ULTRASOUND GUIDANCE FOR PUNCTURE OF THE RIGHT RENAL COLLECTING SYSTEM 2. RIGHT PERCUTANEOUS NEPHROSTOMY TUBE PLACEMENT. COMPARISON:  CT abdomen pelvis - 11/04/2016; renal ultrasound - 10/26/2016; fluoroscopic guided images during right-sided double-J ureteral stent placement - 11/03/2016 MEDICATIONS: The patient is currently admitted to the hospital and receiving intravenous antibiotics. The antibiotic was administered in an appropriate time frame prior to skin puncture. ANESTHESIA/SEDATION: Moderate (conscious) sedation was employed during this procedure. A total of Versed 4 mg and Fentanyl 200 mcg was administered intravenously. Moderate Sedation Time: 55 minutes. The patient's level of consciousness and vital signs were monitored continuously by radiology nursing throughout the procedure under my direct supervision. CONTRAST:  20 mL Isovue 300 administered into the collecting system FLUOROSCOPY TIME:  19 minutes 6 seconds (116 mGy). COMPLICATIONS: None immediate. PROCEDURE: The procedure, risks, benefits, and alternatives were explained to the patient. Questions regarding the procedure were encouraged and answered. The patient understands and consents to the procedure. A  timeout was performed prior to the initiation of the procedure. The right flank region was prepped with Betadine in a sterile fashion, and a sterile drape was applied covering the operative field. A  sterile gown and sterile gloves were used for the procedure. Local anesthesia was provided with 1% Lidocaine with epinephrine. Ultrasound was used to localize the right kidney. Under direct ultrasound guidance, a 21 gauge needle was advanced into the renal collecting system. An ultrasound image documentation was performed. Access within the collecting system was confirmed with the efflux of grossly purulent appearing urine as well as limited contrast injection. Despite prolonged efforts, an 0.018 Nitrex wire could not be advanced beyond the staghorn calculus. As such, a separate stone containing calyx within the mid/inferior pole of the right kidney was targeted with ultrasound guidance. Again, access to the collecting system was confirmed with the efflux of grossly purulent appearing urine as well as limited contrast injection. Again, despite prolonged efforts, an 0.018 Nitrex wire could not be advanced beyond the staghorn calculus calculus. As such, the decision was made at this time to coil a nephrostomy catheter within the dilated dysmorphic portion of the inferior aspect of the right renal pelvis/collecting system. Over a Nitrex wire, the tract was dilated with an Accustick stent. Over a guide wire, a 10-French percutaneous nephrostomy catheter was advanced into the collecting system where the coil was formed and locked. Contrast was injected and several sport radiographs were obtained in various obliquities confirming access. The catheter was secured at the skin with a Prolene retention suture and a gravity bag was placed. A dressing was placed. The patient tolerated procedure well without immediate postprocedural complication. FINDINGS: Ultrasound scanning demonstrates a moderate to severely dilated right renal collecting system. A posterior inferior calyx was initially accessed with contrast injection demonstrated marked dysmorphic appearance of the inferior left renal collecting system. Despite prolonged  efforts including access of an additional stone continuing calyx, a wire could not be advanced beyond the staghorn calculus. Ultimately a 10 French percutaneous nephrostomy catheter was coiled within the inferior aspect of the right renal pelvis/renal collecting system. Contrast injection confirmed appropriate positioning. IMPRESSION: Successful ultrasound and fluoroscopic guided placement of a right sided 10 French PCN, coiled within the inferior aspect of the right renal pelvis / renal collecting system. Note, grossly purulent urine was aspirated following access to the markedly dysmorphic appearing right renal collecting system. This finding questions the viability of the mid and inferior poles of the right kidney (potential XGP appearance). Renal function could be further evaluated with renal scintigraphy as clinically indicated. Above findings discussed with Dr. Karsten Ro at 10:45 on 11/08/16. Electronically Signed   By: Sandi Mariscal M.D.   On: 11/08/2016 10:42     Assessment/Plan: hiv disease = poorly controlled, has been back on hiv regimen in June. Not sure what her viral load looked like prior to reinitiation. I dont think it merits change in regimen quite yet. Recommend to continue with tivicay plus odefsey(with meal) and we will see her back in the ID clinic in 4 wk to retest VL to see if she is virally suppressed at that time.   oi proph = continue in bactrim ss daily  Staghorn calculi with secondary renal infection/pyelo = she appears to have source control. I suspect that the placement of PCN helped. I expanded her abtx empirically yesterday but not sure if she needed it. Await her urine/blood cx results from 8/28 so that we can likely find oral regimen to treat  her with while she waits to have management of her staghorn calculi.   Psoriasis, facial lesion = will do steroid cream  Fever =appears to have resolved since PCN placement  Dr Johnnye Sima to provide further [place],  [facility] for Infectious Diseases Cell: [phone] Pager: [phone]  11/09/2016, 3:43 PM

## 2016-11-10 DIAGNOSIS — B20 Human immunodeficiency virus [HIV] disease: Secondary | ICD-10-CM

## 2016-11-10 DIAGNOSIS — Z9114 Patient's other noncompliance with medication regimen: Secondary | ICD-10-CM

## 2016-11-10 DIAGNOSIS — N200 Calculus of kidney: Secondary | ICD-10-CM

## 2016-11-10 DIAGNOSIS — N12 Tubulo-interstitial nephritis, not specified as acute or chronic: Secondary | ICD-10-CM

## 2016-11-10 DIAGNOSIS — Z79899 Other long term (current) drug therapy: Secondary | ICD-10-CM

## 2016-11-10 LAB — URINE CULTURE: CULTURE: NO GROWTH

## 2016-11-10 MED ORDER — SULFAMETHOXAZOLE-TRIMETHOPRIM 400-80 MG PO TABS
1.0000 | ORAL_TABLET | Freq: Two times a day (BID) | ORAL | Status: DC
  Filled 2016-11-10: qty 1

## 2016-11-10 MED ORDER — SULFAMETHOXAZOLE-TRIMETHOPRIM 400-80 MG PO TABS: 1.0000 | ORAL_TABLET | Freq: Two times a day (BID) | ORAL | 0 refills | Status: AC

## 2016-11-10 MED ORDER — DOCUSATE SODIUM 100 MG PO CAPS: 200.0000 mg | ORAL_CAPSULE | Freq: Two times a day (BID) | ORAL | 0 refills | Status: DC

## 2016-11-10 MED ORDER — OXYCODONE HCL 5 MG PO TABS: 5.0000 mg | ORAL_TABLET | ORAL | 0 refills | Status: DC | PRN

## 2016-11-10 MED ORDER — SENNA 8.6 MG PO TABS: 2.0000 | ORAL_TABLET | Freq: Every day | ORAL | 0 refills | Status: AC

## 2016-11-10 NOTE — Progress Notes (Signed)
Patient and family given discharge, follow up, medication, and nephrostomy tube care instructions, verbalized understanding, IV removed, prescriptions given to patient, family to transport home

## 2016-11-10 NOTE — Care Management Note (Signed)
Case Management Note  Patient Details  Name: MARIANNE GOLIGHTLY MRN: [id number] Date of Birth: 1991/03/11  Subjective/Objective: 26 y/o f admitted w/Sepsis. Went into patient's rm to discuss new scripts for [facility]-explained that these are not covered;colace(OTC),oxycodone(narcotic), & bactrim is on $4 [facility] med list. All other prior meds she has @ home. Patient declines to use [facility] program-she states her mother will assist w/cost. She voiced understanding that [facility] is a 1 time use/1cal year/select pharmacies.Provided discount coupon for oxycodone-patient appreciative. No further CM needs.                    Action/Plan:d/c home.   Expected Discharge Date:  11/10/16               Expected Discharge Plan:  Home/Self Care  In-House Referral:     Discharge planning Services  CM Consult, Medication Assistance  Post Acute Care Choice:    Choice offered to:     DME Arranged:    DME Agency:     HH Arranged:    HH Agency:     Status of Service:  Completed, signed off  If discussed at [employer] of Stay Meetings, dates discussed:    Additional Comments:  Dessa Phi, RN 11/10/2016, 4:26 PM

## 2016-11-10 NOTE — Discharge Summary (Signed)
[facility] Discharge Summary  Admit date: 11/03/2016  Discharge date and time: 11/10/16   Discharge to: Home  Discharge Service: Urology  Discharge Attending Physician:  Dr. Karsten Ro  Discharge  Diagnoses: Right staghorn calculus  Secondary Diagnosis: Active Problems:   Sepsis ([place])   OR Procedures: Procedure(s): CYSTOSCOPY WITH RETROGRADE PYELOGRAM/URETERAL STENT PLACEMENT 11/03/2016 RIGHT NEPHROSTOMY TUBE PLACEMENT 11/07/16   Ancillary Procedures: None   Discharge Day Services: The patient was seen and examined by the Urology team both in the morning and immediately prior to discharge.  Vital signs and laboratory values were stable and within normal limits.  The physical exam was benign and unchanged and all surgical wounds were examined.  Discharge instructions were explained and all questions answered.  Subjective  No acute events overnight. Pain Controlled. No fever or chills in over 48 hours.   Objective Patient Vitals for the past 8 hrs:  BP Temp Temp src Resp SpO2  11/10/16 1254 (!) 131/56 97.9 F (36.6 C) Oral 18 96 %   No intake/output data recorded.  General Appearance:         No acute distress Lungs:                       Normal work of breathing on room air Heart:                                 Regular rate and rhythm Abdomen:                           Soft, non-tender, non-distended Back:          R PCN draining clear yellow urine Extremities:                        Warm and well perfused   Hospital Course:  The patient underwent right ureteral stent placement for a right partial staghorn kidney stone with obstruction and fevers on 11/03/2016.  The patient tolerated the procedure well, was extubated in the OR, and afterwards was taken to the PACU for routine post-surgical care.   She did well, but continued to spike fevers, and so a CT abd/pelvis was ordered without contrast, which revealed obstruction in the lower pole, but good drainage from the  upper from from stent placement. Given continued fevers and obstruction, she underwent right PCN placement on 11/07/16. Since then she has done well with a decreasing fever curve and clear yellow drainage from the PCN.   Given her poor compliance with HIV medication, as well as her complicated UTI, Inf disease was consulted, who repeated blood and urine cultures on 11/08/16, both of which were negative. As such, she was changed from IV cefepime to PO Bactrim, to be continued through her PCNL in about 2 weeks. Per ID, there is no contraindication to proceeding despite her low CD4 count (70), though she might have a slightly increased risk of infection  Prior to discharge, we discussed PCNL, as well as the risks and benefits. She will return for this in 1-2 weeks.   The patient was discharged home 7 Days Post-Op, at which point was tolerating a regular solid diet, was able to void spontaneously, have adequate pain control with P.O. pain medication, and could ambulate without difficulty. The patient will follow up with [place] for post op check.   Condition  at Discharge: Improved  Discharge Medications:  Allergies as of 11/10/2016   No Known Allergies     Medication List    STOP taking these medications   pantoprazole 20 MG tablet Commonly known as:  PROTONIX     TAKE these medications   acetaminophen 500 MG tablet Commonly known as:  TYLENOL Take 1,000 mg by mouth every 6 (six) hours as needed for mild pain, moderate pain, fever or headache.   docusate sodium 100 MG capsule Commonly known as:  COLACE Take 2 capsules (200 mg total) by mouth 2 (two) times daily.   dolutegravir 50 MG tablet Commonly known as:  TIVICAY Take 1 tablet (50 mg total) by mouth daily.   emtricitabine-rilpivir-tenofovir AF 200-25-25 MG Tabs tablet Commonly known as:  ODEFSEY Take 1 tablet by mouth daily.   oxyCODONE 5 MG immediate release tablet Commonly known as:  Oxy IR/ROXICODONE Take 1 tablet (5 mg total) by  mouth every 4 (four) hours as needed for moderate pain.   senna 8.6 MG Tabs tablet Commonly known as:  SENOKOT Take 2 tablets (17.2 mg total) by mouth at bedtime.   sulfamethoxazole-trimethoprim 400-80 MG tablet Commonly known as:  BACTRIM,SEPTRA Take 1 tablet by mouth every 12 (twelve) hours.            Discharge Care Instructions        Start     Ordered   11/10/16 0000  docusate sodium (COLACE) 100 MG capsule  2 times daily     11/10/16 1540   11/10/16 0000  oxyCODONE (OXY IR/ROXICODONE) 5 MG immediate release tablet  Every 4 hours PRN     11/10/16 1540   11/10/16 0000  senna (SENOKOT) 8.6 MG TABS tablet  Daily at bedtime     11/10/16 1540   11/10/16 0000  sulfamethoxazole-trimethoprim (BACTRIM,SEPTRA) 400-80 MG tablet  Every 12 hours     11/10/16 1540   11/10/16 0000  Increase activity slowly     11/10/16 1540   11/10/16 0000  Diet general     11/10/16 1540   11/10/16 0000  Call MD for:  temperature >100.4     11/10/16 1540   11/10/16 0000  Call MD for:  severe uncontrolled pain     11/10/16 1540   11/10/16 0000  Call MD for:  persistant nausea and vomiting     11/10/16 1540   11/10/16 0000  Call MD for:  redness, tenderness, or signs of infection (pain, swelling, redness, odor or green/yellow discharge around incision site)     11/10/16 1540   11/10/16 0000  Call MD for:  difficulty breathing, headache or visual disturbances     11/10/16 1540   11/10/16 0000  Discharge instructions     11/10/16 1540      Patient was seen, examined,treatment plan was discussed with the resident.  I have directly reviewed the clinical findings, lab, imaging studies and management of this patient in detail. I have made the necessary changes and/or additions to the above noted documentation, and agree with the documentation, as recorded by the resident.

## 2016-11-10 NOTE — Progress Notes (Signed)
INFECTIOUS DISEASE PROGRESS NOTE  ID: Kimberly Bishop is a 26 y.o. female with  Active Problems:   Sepsis ([place])  Subjective: Without complaints.  Denies pain.  Has missed her ART at home.   Abtx:  Anti-infectives    Start     Dose/Rate Route Frequency Ordered Stop   11/09/16 1000  sulfamethoxazole-trimethoprim (BACTRIM,SEPTRA) 400-80 MG per tablet 1 tablet     1 tablet Oral Daily 11/08/16 1741     11/08/16 1800  ceFEPIme (MAXIPIME) 1 g in dextrose 5 % 50 mL IVPB     1 g 100 mL/hr over 30 Minutes Intravenous Every 8 hours 11/08/16 1635     11/06/16 1700  vancomycin (VANCOCIN) IVPB 1000 mg/200 mL premix  Status:  Discontinued     1,000 mg 200 mL/hr over 60 Minutes Intravenous Every 12 hours 11/06/16 1637 11/08/16 1741   11/04/16 1930  cefTRIAXone (ROCEPHIN) 2 g in dextrose 5 % 50 mL IVPB  Status:  Discontinued     2 g 100 mL/hr over 30 Minutes Intravenous Every 24 hours 11/03/16 1918 11/08/16 1626   11/04/16 1200  emtricitabine-rilpivir-tenofovir AF (ODEFSEY) 200-25-25 MG per tablet 1 tablet     1 tablet Oral Daily with breakfast 11/04/16 0749     11/04/16 1000  dolutegravir (TIVICAY) tablet 50 mg     50 mg Oral Daily 11/04/16 0749     11/04/16 0215  vancomycin (VANCOCIN) IVPB 750 mg/150 ml premix  Status:  Discontinued     750 mg 150 mL/hr over 60 Minutes Intravenous Every 12 hours 11/04/16 0211 11/06/16 1637   11/03/16 1630  cefTRIAXone (ROCEPHIN) 2 g in dextrose 5 % 50 mL IVPB     2 g 100 mL/hr over 30 Minutes Intravenous 30 min pre-op 11/03/16 1632 11/03/16 1942      Medications:  Scheduled: . docusate sodium  200 mg Oral BID  . dolutegravir  50 mg Oral Daily  . emtricitabine-rilpivir-tenofovir AF  1 tablet Oral Q breakfast  . oxybutynin  5 mg Oral TID  . senna  2 tablet Oral QHS  . sodium chloride flush  5 mL Intravenous Q8H  . sulfamethoxazole-trimethoprim  1 tablet Oral Daily  . triamcinolone 0.1 % cream : eucerin   Topical BID    Objective: Vital signs  in last 24 hours: Temp:  [97.9 F (36.6 C)-98.5 F (36.9 C)] 97.9 F (36.6 C) (08/30 1254) Pulse Rate:  [83-94] 83 (08/30 0426) Resp:  [17-18] 18 (08/30 1254) BP: (103-131)/(56-63) 131/56 (08/30 1254) SpO2:  [96 %-97 %] 96 % (08/30 1254)   General appearance: alert, cooperative and no distress Resp: clear to auscultation bilaterally Cardio: regular rate and rhythm GI: normal findings: bowel sounds normal and soft, non-tender  Lab Results  Recent Labs  11/08/16 0440  WBC 4.8  HGB 9.2*  HCT 27.0*   Liver Panel No results for input(s): PROT, ALBUMIN, AST, ALT, ALKPHOS, BILITOT, BILIDIR, IBILI in the last 72 hours. Sedimentation Rate No results for input(s): ESRSEDRATE in the last 72 hours. C-Reactive Protein No results for input(s): CRP in the last 72 hours.  Microbiology: Recent Results (from the past 240 hour(s))  Culture, blood (routine x 2)     Status: None   Collection Time: 11/04/16  8:26 AM  Result Value Ref Range Status   Specimen Description BLOOD LEFT ARM  Final   Special Requests IN PEDIATRIC BOTTLE Blood Culture adequate volume  Final   Culture   Final    NO  GROWTH 5 DAYS Performed at [facility], [place] [address], [place], [place] [address]    Report Status 11/09/2016 FINAL  Final  Culture, blood (routine x 2)     Status: None   Collection Time: 11/04/16  8:26 AM  Result Value Ref Range Status   Specimen Description BLOOD LEFT HAND  Final   Special Requests   Final    BOTTLES DRAWN AEROBIC ONLY Blood Culture adequate volume   Culture   Final    NO GROWTH 5 DAYS Performed at [facility], [place] [address], [place], [place] [address]    Report Status 11/09/2016 FINAL  Final  Culture, Urine     Status: None   Collection Time: 11/08/16  9:19 AM  Result Value Ref Range Status   Specimen Description URINE, CLEAN CATCH  Final   Special Requests NONE  Final   Culture   Final    NO GROWTH Performed at [facility], [place] [address],  [place], [place] [address]    Report Status 11/10/2016 FINAL  Final  Culture, blood (routine x 2)     Status: None (Preliminary result)   Collection Time: 11/08/16  7:22 PM  Result Value Ref Range Status   Specimen Description BLOOD RIGHT ARM  Final   Special Requests IN PEDIATRIC BOTTLE Blood Culture adequate volume  Final   Culture   Final    NO GROWTH 2 DAYS Performed at [facility], [place] [address], [place], [place] [address]    Report Status PENDING  Incomplete  Culture, blood (routine x 2)     Status: None (Preliminary result)   Collection Time: 11/08/16  7:30 PM  Result Value Ref Range Status   Specimen Description BLOOD RIGHT ARM  Final   Special Requests IN PEDIATRIC BOTTLE Blood Culture adequate volume  Final   Culture   Final    NO GROWTH 2 DAYS Performed at [facility], [place] [address] [place],  [address]    Report Status PENDING  Incomplete    Studies/Results: No results found.   Assessment/Plan: AIDS (perinatal) R staghorn calculus Pyelo  Total days of antibiotics: 3 cefepime Tivicay/odefsy  Will add genotype and integrase resistance testing.  Continue bactrim for prophylaxis change to bid bactrim until she has procedure Post procedure, change to qday. Explained to pt.  She denies difficulty with taking bactrim in the past.   My great appreciation to Urology          Bobby Rumpf Infectious Diseases (pager) [phone] [email or web address] 11/10/2016, 3:16 PM  LOS: 7 days

## 2016-11-10 NOTE — Progress Notes (Deleted)
Patient ID: Kimberly Bishop, female   DOB: August 28, 1990, 26 y.o.   MRN: [id number] 7 Days Post-Op  Assessment: 1.  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  F/u CT poor drainage of her lower pole. Given she was continuing to spike fevers, she underwent R lower pole PCN placement on 11/09/16. 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 Will consider lasix scan to eval function if UOP drops. May require nephrectomy if continues to spike fevers despite adequate drainage and antibiotic coverage. Will stay the course for now.   2.  Right staghorn calculus.  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   3. HIV: Consulted ID for recommendations regarging timing of PCNL given previously low CD4 count. Per their recommendation, we have switched her antibiotics to Cefepime, and ordered repeat cultures, pending.    Plan:  - Continue current antibiotics per ID. - Follow up blood and urine cultures. Will ideally transition to PO antibiotics today or tomorrow and discharge. - Continue bowel regimen - Continue R PCN to drainage.  Subjective: Doing well. It's her 26th birthday.    Objective: Vital signs in last 24 hours: Temp:  [98.4 F (36.9 C)-99.2 F (37.3 C)] 98.4 F (36.9 C) (08/30 0426) Pulse Rate:  [83-94] 83 (08/30 0426) Resp:  [17-18] 18 (08/30 0426) BP: (103-120)/(60-70) 103/60 (08/30 0426) SpO2:  [96 %-97 %] 96 % (08/30 0426)  Intake/Output from previous day: 08/29 0701 - 08/30 0700 In: 585 [P.O.:480; IV Piggyback:100] Out: 1300 [Urine:1300] Intake/Output this shift: No intake/output data recorded.  Past Medical History:  Diagnosis Date  . 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 HIV (human immunodeficiency virus infection) ([place])   . Immune deficiency disorder ([place])    Current Facility-Administered Medications  Medication Dose Route Frequency Provider Last Rate Last Dose  . acetaminophen (TYLENOL) tablet 650 mg  650 mg Oral Q4H PRN Kathie Rhodes, MD   650 mg at 11/08/16 0444  . bisacodyl (DULCOLAX) suppository 10 mg  10 mg Rectal Daily PRN Stasia Cavalier, MD      . ceFEPIme (MAXIPIME) 1 g in dextrose 5 % 50 mL IVPB  1 g Intravenous Q8H Kathie Rhodes, MD   Stopped at 11/10/16 0327  . diphenhydrAMINE (BENADRYL) capsule 25 mg  25 mg Oral Q6H PRN Filippou, Braxton Feathers, MD      . docusate sodium (COLACE) capsule 200 mg  200 mg Oral BID Stasia Cavalier, MD   200 mg at 11/09/16 0915  . dolutegravir (TIVICAY) tablet 50 mg  50 mg Oral Daily Filippou, Braxton Feathers, MD   50 mg at 11/09/16 0915  . emtricitabine-rilpivir-tenofovir AF (ODEFSEY) 200-25-25 MG per tablet 1 tablet  1 tablet Oral Q breakfast Filippou, Braxton Feathers, MD   1 tablet at 11/09/16 0859  . HYDROmorphone (DILAUDID) injection 0.5-1 mg  0.5-1 mg Intravenous Q2H PRN Kathie Rhodes, MD   0.5 mg at 11/10/16 0306  . iopamidol (ISOVUE-300) 61 % injection 20 mL  20 mL Other Once PRN Sandi Mariscal, MD      . ondansetron [facility]) injection 4 mg  4 mg Intravenous Q4H PRN Kathie Rhodes, MD   4 mg at 11/07/16 1035  .  opium-belladonna (B&O SUPPRETTES) 16.2-60 MG suppository 1 suppository  1 suppository Rectal Q8H PRN Stasia Cavalier, MD      . oxybutynin (DITROPAN) tablet 5 mg  5 mg Oral TID Filippou, Braxton Feathers, MD   5 mg at 11/07/16 1906  . oxyCODONE (Oxy IR/ROXICODONE) immediate release tablet 5 mg  5 mg Oral Q4H PRN Kathie Rhodes, MD   5 mg at 11/09/16 2311  . phenol (CHLORASEPTIC) mouth spray 1 spray  1 spray Mouth/Throat PRN Kathie Rhodes, MD      . senna (SENOKOT) tablet 17.2 mg  2 tablet Oral QHS Stasia Cavalier, MD      . sodium chloride flush (NS)  0.9 % injection 5 mL  5 mL Intravenous Lovena Neighbours, MD   5 mL at 11/10/16 0257  . sulfamethoxazole-trimethoprim (BACTRIM,SEPTRA) 400-80 MG per tablet 1 tablet  1 tablet Oral Daily Carlyle Basques, MD   1 tablet at 11/09/16 0915  . triamcinolone 0.1 % cream : eucerin cream, 1:1   Topical BID Carlyle Basques, MD        Physical Exam:  General: Patient is in no apparent distress Lungs: Normal respiratory effort, chest expands symmetrically. GI: The abdomen is soft and nontender without mass. Back: R PCN draining clear yellow urine.    Lab Results:  Recent Labs  11/08/16 0440  WBC 4.8  HGB 9.2*  HCT 27.0*   BMET No results for input(s): NA, K, CL, CO2, GLUCOSE, BUN, CREATININE, CALCIUM in the last 72 hours.  Recent Labs  11/07/16 1056  INR 1.27   No results for input(s): LABURIN in the last 72 hours. Results for orders placed or performed during the hospital encounter of 11/03/16  Culture, blood (routine x 2)     Status: None   Collection Time: 11/04/16  8:26 AM  Result Value Ref Range Status   Specimen Description BLOOD LEFT ARM  Final   Special Requests IN PEDIATRIC BOTTLE Blood Culture adequate volume  Final   Culture   Final    NO GROWTH 5 DAYS Performed at [facility], [place] [address] [place], [place] [address]    Report Status 11/09/2016 FINAL  Final  Culture, blood (routine x 2)     Status: None   Collection Time: 11/04/16  8:26 AM  Result Value Ref Range Status   Specimen Description BLOOD LEFT HAND  Final   Special Requests   Final    BOTTLES DRAWN AEROBIC ONLY Blood Culture adequate volume   Culture   Final    NO GROWTH 5 DAYS Performed at [facility], [place] [address], [place], [place] [address]    Report Status 11/09/2016 FINAL  Final  Culture, blood (routine x 2)     Status: None (Preliminary result)   Collection Time: 11/08/16  7:22 PM  Result Value Ref Range Status   Specimen Description BLOOD RIGHT ARM  Final   Special Requests IN  PEDIATRIC BOTTLE Blood Culture adequate volume  Final   Culture   Final    NO GROWTH < 12 HOURS Performed at [facility], [place] [address], [place], [place] [address]    Report Status PENDING  Incomplete  Culture, blood (routine x 2)     Status: None (Preliminary result)   Collection Time: 11/08/16  7:30 PM  Result Value Ref Range Status   Specimen Description BLOOD RIGHT ARM  Final   Special Requests IN PEDIATRIC BOTTLE Blood Culture adequate volume  Final  Culture   Final    NO GROWTH < 12 HOURS Performed at [facility], [place] [address] [place], [place] [address]    Report Status PENDING  Incomplete    Studies/Results: No results found.  Stasia Cavalier 11/10/2016, 8:53 AM  Patient was seen, examined,treatment plan was discussed with the resident.  I have directly reviewed the clinical findings, lab, imaging studies and management of this patient in detail. I have made the necessary changes and/or additions to the above noted documentation, and agree with the documentation, as recorded by the resident.

## 2016-11-10 NOTE — Progress Notes (Signed)
Patient ID: Kimberly Bishop, female   DOB: [birth year], 26 y.o.   MRN: [id number] 7 Days Post-Op  Assessment: 1.  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  F/u CT poor drainage of her lower pole. Given she was continuing to spike fevers, she underwent R lower pole PCN placement on 11/09/16. 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 Will consider lasix scan to eval function if UOP drops. May require nephrectomy if continues to spike fevers despite adequate drainage and antibiotic coverage. Will stay the course for now.   2.  Right staghorn calculus.  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   3. HIV: Consulted ID for recommendations regarging timing of PCNL given previously low CD4 count. Per their recommendation, we have switched her antibiotics to Cefepime, and ordered repeat cultures, pending.    Plan:  - Continue current antibiotics per ID. - Follow up blood and urine cultures. Will ideally transition to PO antibiotics today or tomorrow and discharge. - Continue bowel regimen - Continue R PCN to drainage.  Subjective: Doing well. It's her 26th birthday.    Objective: Vital signs in last 24 hours: Temp:  [98.4 F (36.9 C)-99.2 F (37.3 C)] 98.4 F (36.9 C) (08/30 0426) Pulse Rate:  [83-94] 83 (08/30 0426) Resp:  [17-18] 18 (08/30 0426) BP: (103-120)/(60-70) 103/60 (08/30 0426) SpO2:  [96 %-97 %] 96 % (08/30 0426)  Intake/Output from previous day: 08/29 0701 - 08/30 0700 In: 585 [P.O.:480; IV Piggyback:100] Out: 1300 [Urine:1300] Intake/Output this shift: No intake/output data recorded.  Past Medical History:  Diagnosis Date  . AIDS  (acquired immunodeficiency syndrome), CD4 <=200 ([place])   . HIV (human immunodeficiency virus infection) ([place])   . Immune deficiency disorder ([place])    Current Facility-Administered Medications  Medication Dose Route Frequency Provider Last Rate Last Dose  . acetaminophen (TYLENOL) tablet 650 mg  650 mg Oral Q4H PRN Kathie Rhodes, MD   650 mg at 11/08/16 0444  . bisacodyl (DULCOLAX) suppository 10 mg  10 mg Rectal Daily PRN Stasia Cavalier, MD      . ceFEPIme (MAXIPIME) 1 g in dextrose 5 % 50 mL IVPB  1 g Intravenous Q8H Kathie Rhodes, MD   Stopped at 11/10/16 1001  . diphenhydrAMINE (BENADRYL) capsule 25 mg  25 mg Oral Q6H PRN Filippou, Braxton Feathers, MD      . docusate sodium (COLACE) capsule 200 mg  200 mg Oral BID Stasia Cavalier, MD   200 mg at 11/10/16 0932  . dolutegravir (TIVICAY) tablet 50 mg  50 mg Oral Daily Filippou, Braxton Feathers, MD   50 mg at 11/10/16 0932  . emtricitabine-rilpivir-tenofovir AF (ODEFSEY) 200-25-25 MG per tablet 1 tablet  1 tablet Oral Q breakfast Filippou, Braxton Feathers, MD   1 tablet at 11/10/16 0932  . HYDROmorphone (DILAUDID) injection 0.5-1 mg  0.5-1 mg Intravenous Q2H PRN Kathie Rhodes, MD   1 mg at 11/10/16 5701  . iopamidol (ISOVUE-300) 61 % injection 20 mL  20 mL Other Once PRN Sandi Mariscal, MD      . ondansetron [facility]) injection 4 mg  4 mg Intravenous Q4H PRN Kathie Rhodes, MD   4 mg at 11/07/16 1035  .  opium-belladonna (B&O SUPPRETTES) 16.2-60 MG suppository 1 suppository  1 suppository Rectal Q8H PRN Stasia Cavalier, MD      . oxybutynin (DITROPAN) tablet 5 mg  5 mg Oral TID Filippou, Braxton Feathers, MD   5 mg at 11/10/16 0932  . oxyCODONE (Oxy IR/ROXICODONE) immediate release tablet 5 mg  5 mg Oral Q4H PRN Kathie Rhodes, MD   5 mg at 11/09/16 2311  . phenol (CHLORASEPTIC) mouth spray 1 spray  1 spray Mouth/Throat PRN Kathie Rhodes, MD      . senna (SENOKOT) tablet 17.2 mg  2 tablet Oral QHS Stasia Cavalier, MD      . sodium chloride flush (NS) 0.9  % injection 5 mL  5 mL Intravenous Lovena Neighbours, MD   5 mL at 11/10/16 0257  . sulfamethoxazole-trimethoprim (BACTRIM,SEPTRA) 400-80 MG per tablet 1 tablet  1 tablet Oral Daily Carlyle Basques, MD   1 tablet at 11/10/16 0932  . triamcinolone 0.1 % cream : eucerin cream, 1:1   Topical BID Carlyle Basques, MD        Physical Exam:  General: Patient is in no apparent distress Lungs: Normal respiratory effort, chest expands symmetrically. GI: The abdomen is soft and nontender without mass. Back: R PCN draining clear yellow urine.    Lab Results:  Recent Labs  11/08/16 0440  WBC 4.8  HGB 9.2*  HCT 27.0*   BMET No results for input(s): NA, K, CL, CO2, GLUCOSE, BUN, CREATININE, CALCIUM in the last 72 hours. No results for input(s): LABPT, INR in the last 72 hours. No results for input(s): LABURIN in the last 72 hours. Results for orders placed or performed during the hospital encounter of 11/03/16  Culture, blood (routine x 2)     Status: None   Collection Time: 11/04/16  8:26 AM  Result Value Ref Range Status   Specimen Description BLOOD LEFT ARM  Final   Special Requests IN PEDIATRIC BOTTLE Blood Culture adequate volume  Final   Culture   Final    NO GROWTH 5 DAYS Performed at [facility], [place] [address], [place], [place] [address]    Report Status 11/09/2016 FINAL  Final  Culture, blood (routine x 2)     Status: None   Collection Time: 11/04/16  8:26 AM  Result Value Ref Range Status   Specimen Description BLOOD LEFT HAND  Final   Special Requests   Final    BOTTLES DRAWN AEROBIC ONLY Blood Culture adequate volume   Culture   Final    NO GROWTH 5 DAYS Performed at [facility], [place] [address], [place], [place] [address]    Report Status 11/09/2016 FINAL  Final  Culture, blood (routine x 2)     Status: None (Preliminary result)   Collection Time: 11/08/16  7:22 PM  Result Value Ref Range Status   Specimen Description BLOOD RIGHT ARM  Final   Special  Requests IN PEDIATRIC BOTTLE Blood Culture adequate volume  Final   Culture   Final    NO GROWTH < 12 HOURS Performed at [facility], [place] [address] [place], [place] [address]    Report Status PENDING  Incomplete  Culture, blood (routine x 2)     Status: None (Preliminary result)   Collection Time: 11/08/16  7:30 PM  Result Value Ref Range Status   Specimen Description BLOOD RIGHT ARM  Final   Special Requests IN PEDIATRIC BOTTLE Blood Culture adequate volume  Final  Culture   Final    NO GROWTH < 12 HOURS Performed at [facility], [place] [address] [place], [place] [address]    Report Status PENDING  Incomplete    Studies/Results: No results found.  Stasia Cavalier 11/10/2016, 11:18 AM  Patient was seen, examined,treatment plan was discussed with the resident.  I have directly reviewed the clinical findings, lab, imaging studies and management of this patient in detail. I have made the necessary changes and/or additions to the above noted documentation, and agree with the documentation, as recorded by the resident.

## 2016-11-13 LAB — CULTURE, BLOOD (ROUTINE X 2)
Culture: NO GROWTH
Culture: NO GROWTH
SPECIAL REQUESTS: ADEQUATE
SPECIAL REQUESTS: ADEQUATE

## 2016-11-17 ENCOUNTER — Ambulatory Visit: Payer: Self-pay

## 2016-11-17 ENCOUNTER — Other Ambulatory Visit: Payer: Self-pay | Admitting: Urology

## 2016-11-17 ENCOUNTER — Other Ambulatory Visit: Payer: Self-pay | Admitting: *Deleted

## 2016-11-17 DIAGNOSIS — B20 Human immunodeficiency virus [HIV] disease: Secondary | ICD-10-CM

## 2016-11-17 MED ORDER — EMTRICITAB-RILPIVIR-TENOFOV AF 200-25-25 MG PO TABS: 1.0000 | ORAL_TABLET | Freq: Every day | ORAL | 3 refills | Status: DC

## 2016-11-17 MED ORDER — DOLUTEGRAVIR SODIUM 50 MG PO TABS: 50.0000 mg | ORAL_TABLET | Freq: Every day | ORAL | 3 refills | Status: DC

## 2016-11-17 NOTE — Progress Notes (Signed)
Refilled per Dr Linus Salmons for patient assistance application. Landis Gandy, RN

## 2016-11-18 ENCOUNTER — Encounter: Payer: Self-pay | Admitting: Internal Medicine

## 2016-11-18 ENCOUNTER — Other Ambulatory Visit (HOSPITAL_COMMUNITY): Payer: Self-pay | Admitting: Urology

## 2016-11-18 ENCOUNTER — Other Ambulatory Visit: Payer: Self-pay | Admitting: Radiology

## 2016-11-18 DIAGNOSIS — N200 Calculus of kidney: Secondary | ICD-10-CM

## 2016-11-21 ENCOUNTER — Encounter: Payer: Self-pay | Admitting: Internal Medicine

## 2016-11-21 ENCOUNTER — Ambulatory Visit (INDEPENDENT_AMBULATORY_CARE_PROVIDER_SITE_OTHER): Payer: Self-pay | Admitting: Internal Medicine

## 2016-11-21 ENCOUNTER — Other Ambulatory Visit: Payer: Self-pay | Admitting: Pharmacist

## 2016-11-21 DIAGNOSIS — B20 Human immunodeficiency virus [HIV] disease: Secondary | ICD-10-CM

## 2016-11-21 DIAGNOSIS — Z9189 Other specified personal risk factors, not elsewhere classified: Secondary | ICD-10-CM

## 2016-11-21 DIAGNOSIS — B081 Molluscum contagiosum: Secondary | ICD-10-CM

## 2016-11-21 MED ORDER — BICTEGRAVIR-EMTRICITAB-TENOFOV 50-200-25 MG PO TABS: 1.0000 | ORAL_TABLET | Freq: Every day | ORAL | 2 refills | Status: DC

## 2016-11-21 MED ORDER — DARUN-COBIC-EMTRICIT-TENOFAF 800-150-200-10 MG PO TABS: 1.0000 | ORAL_TABLET | Freq: Every day | ORAL | 0 refills | Status: DC

## 2016-11-21 MED FILL — SYMTUZA 800-150-200-10 MG T: 800-150-200 | 30 days supply | Qty: 30 | Fill #0

## 2016-11-21 MED FILL — BIKTARVY 50-200-25 MG TABS: 50-200-25 | 30 days supply | Qty: 30 | Fill #0

## 2016-11-21 NOTE — Progress Notes (Signed)
   Subjective:    Patient ID: Kimberly Bishop, female    DOB: Aug 01, 1990, 26 y.o.   MRN: [id number]  HPI Here for follow up of HIV She has been poorly compliant in the past and previously was on [place] and Tivicay, though no history of resistance, only to give her two small pills.  She was hospitalized 8/23-8/30 with pyelonephritis and hydronephrosis with a stone and required cystocsopy and ureteral catheterization and double-J stent placement.  She has a right staghorn calculus that will be addressed as an outpatient. Unfortunately, she ran out of her ARVs and has already burned through emergency access routes for medication.  There is no resistance in the past known with past genotypes with wild type due to non-compliance.     Review of Systems  Constitutional: Negative for chills, fatigue and fever.  Gastrointestinal: Negative for diarrhea.  Genitourinary: Negative for hematuria and pelvic pain.  Skin: Positive for rash.       Objective:   Physical Exam  Constitutional: She appears well-developed and well-nourished. No distress.  Eyes: No scleral icterus.  Cardiovascular: Normal rate, regular rhythm and normal heart sounds.   No murmur heard. Pulmonary/Chest: Effort normal and breath sounds normal. No respiratory distress.  Musculoskeletal: She exhibits no edema.  Lymphadenopathy:    She has no cervical adenopathy.  Skin: Rash noted.  + Molluscum on face   SH: sexually active with condoms       Assessment & Plan:

## 2016-11-21 NOTE — Assessment & Plan Note (Signed)
This will only get better if she remains on ARVs.  Counseled.

## 2016-11-21 NOTE — Progress Notes (Signed)
HPI: Kimberly Bishop is a 26 y.o. female who is here to see Dr. Linus Bishop after missing in action for a while.   Allergies: No Known Allergies  Vitals: Temp: 98 F (36.7 C) (09/10 1406) Temp Source: Oral (09/10 1406) BP: 126/82 (09/10 1406) Pulse Rate: 96 (09/10 1406)  Past Medical History: Past Medical History:  Diagnosis Date  . AIDS (acquired immunodeficiency syndrome), CD4 <=200 ([place])   . HIV (human immunodeficiency virus infection) ([place])   . Immune deficiency disorder [facility])     Social History: Social History   Social History  . Marital status: Single    Spouse name: N/A  . Number of children: N/A  . Years of education: N/A   Social History Main Topics  . Smoking status: Never Smoker  . Smokeless tobacco: Never Used  . Alcohol use No     Comment: occasional  . Drug use: No  . Sexual activity: Yes    Partners: Male    Birth control/ protection: Condom     Comment: same partner for a couple months   Other Topics Concern  . None   Social History Narrative  . None    Previous Regimen: DTG/TRV, ATV/r  Current Regimen: [place]/DTG  Labs: HIV 1 RNA Quant (copies/mL)  Date Value  11/07/2016 17,200  10/27/2015 625 (H)  02/26/2015 <20   CD4 T Cell Abs (/uL)  Date Value  11/07/2016 70 (L)  03/22/2016 110 (L)  10/27/2015 70 (L)   Hep B S Ab (no units)  Date Value  12/09/2011 NEG   Hepatitis B Surface Ag (no units)  Date Value  12/09/2011 NEGATIVE   HCV Ab (no units)  Date Value  12/09/2011 NEGATIVE    CrCl: Estimated Creatinine Clearance: 79 mL/min (by C-G formula based on SCr of 0.85 mg/dL).  Lipids:    Component Value Date/Time   CHOL 165 10/27/2015 1206   TRIG 45 10/27/2015 1206   HDL 56 10/27/2015 1206   CHOLHDL 2.9 10/27/2015 1206   VLDL 9 10/27/2015 1206   LDLCALC 100 10/27/2015 1206    Assessment: Kimberly Bishop has had a very poor history of being compliance. She has never been suppressed for any extended period of time. Part of  the reason for that was due to difficulty swallowing with certain ART. We put her on the previous regimen due to pill size. She ran out of meds a few days ago.   She was recently admitted for pyelo and kidney stones and required stent placement. She still needs an outpt procedure. In the meantime, we are trying to get her to restart on therapy. We couldn't find any resistance in her, therefore, we are going to put her on Biktarvy. However, she has used the 30 day emergency access already so we will apply her for the patient assistance for it. We will use Symtuza voucher for now until the [place] is approved. Counseled her that [place] can be cut in half and take. Gave her a pill cutter. She lives in [place] and she is the only one earning income in the household with her mom and brother. [facility] will mail her the meds.   Recommendations:  Symtuza 1 PO daily with food until [name] is approved F/u in 2 wks for labs  Onnie Boer, PharmD, BCPS, AAHIVP, CPP Clinical [facility] [place] [facility] 11/21/2016, 3:04 PM

## 2016-11-21 NOTE — Assessment & Plan Note (Signed)
She will continue on Bactrim

## 2016-11-21 NOTE — Assessment & Plan Note (Addendum)
Counseled her on staying on track.  Her only quick option at this point is to start [place] today and then get Biktarvy through patient assistance, which can take 2-3 weeks.  Then hopefully the drug assitance program will be active.    rtc 2-3 weeks with me for follow up lab.  No labs today since she has been off ARVs.

## 2016-11-24 LAB — REFLEX TO GENOSURE(R) MG
HIV GENOSURE(R): UNDETERMINED
HIV GenoSure: UNDETERMINED

## 2016-11-24 LAB — GENOSURE INTEGRASE HIV EDI: HIV GENOSURE PDF1: UNDETERMINED

## 2016-11-24 LAB — HIV-1 RNA ULTRAQUANT REFLEX TO GENTYP+
HIV-1 RNA BY PCR: 2520 {copies}/mL
HIV-1 RNA QUANT, LOG: 3.401 {Log_copies}/mL

## 2016-11-25 ENCOUNTER — Telehealth: Payer: Self-pay | Admitting: Pharmacist Clinician (PhC)/ Clinical Pharmacy Specialist

## 2016-11-25 ENCOUNTER — Other Ambulatory Visit: Payer: Self-pay | Admitting: Pharmacist Clinician (PhC)/ Clinical Pharmacy Specialist

## 2016-11-25 NOTE — Telephone Encounter (Signed)
awesome

## 2016-11-25 NOTE — Telephone Encounter (Signed)
Kimberly Bishop is now approved for [employer] through patient assistance until her ADAP is approved. She started on Symtuza. We gave her a pill cutter. She has had no issue so far. She has always had an issue with swallowing so she will switch to [facility] Monday when she gets it in the mail. She has a f/u in 2 wks.

## 2016-11-30 NOTE — Progress Notes (Signed)
PT, PTT 11-07-16 epic

## 2016-11-30 NOTE — Patient Instructions (Addendum)
EVONY REZEK  11/30/2016   Your procedure is scheduled on: 12-05-16  Report to [facility] Main  Entrance Take [place]  elevators to 3rd floor to  [place] at [phone].   Call this number if you have problems the morning of surgery [phone]    Remember: ONLY 1 PERSON MAY GO WITH YOU TO SHORT STAY TO GET  READY MORNING OF YOUR SURGERY.  Do not eat food or drink liquids :After Midnight.     Take these medicines the morning of surgery with A SIP OF WATER: antiviral medication , bactrim                                 You may not have any metal on your body including hair pins and              piercings  Do not wear jewelry, make-up, lotions, powders or perfumes, deodorant             Do not wear nail polish.  Do not shave  48 hours prior to surgery.      Do not bring valuables to the hospital. [place].  Contacts, dentures or bridgework may not be worn into surgery.  Leave suitcase in the car. After surgery it may be brought to your room.               Please read over the following fact sheets you were given: _____________________________________________________________________        [facility] - Preparing for Surgery Before surgery, you can play an important role.  Because skin is not sterile, your skin needs to be as free of germs as possible.  You can reduce the number of germs on your skin by washing with CHG (chlorahexidine gluconate) soap before surgery.  CHG is an antiseptic cleaner which kills germs and bonds with the skin to continue killing germs even after washing. Please DO NOT use if you have an allergy to CHG or antibacterial soaps.  If your skin becomes reddened/irritated stop using the CHG and inform your nurse when you arrive at Short Stay. Do not shave (including legs and underarms) for at least 48 hours prior to the first CHG shower.  You may shave your face/neck. Please follow  these instructions carefully:  1.  Shower with CHG Soap the night before surgery and the  morning of Surgery.  2.  If you choose to wash your hair, wash your hair first as usual with your  normal  shampoo.  3.  After you shampoo, rinse your hair and body thoroughly to remove the  shampoo.                           4.  Use CHG as you would any other liquid soap.  You can apply chg directly  to the skin and wash                       Gently with a scrungie or clean washcloth.  5.  Apply the CHG Soap to your body ONLY FROM THE NECK DOWN.   Do not use on face/ open  Wound or open sores. Avoid contact with eyes, ears mouth and genitals (private parts).                       Wash face,  Genitals (private parts) with your normal soap.             6.  Wash thoroughly, paying special attention to the area where your surgery  will be performed.  7.  Thoroughly rinse your body with warm water from the neck down.  8.  DO NOT shower/wash with your normal soap after using and rinsing off  the CHG Soap.                9.  Pat yourself dry with a clean towel.            10.  Wear clean pajamas.            11.  Place clean sheets on your bed the night of your first shower and do not  sleep with pets. Day of Surgery : Do not apply any lotions/deodorants the morning of surgery.  Please wear clean clothes to the hospital/surgery center.  FAILURE TO FOLLOW THESE INSTRUCTIONS MAY RESULT IN THE CANCELLATION OF YOUR SURGERY PATIENT SIGNATURE_________________________________  NURSE SIGNATURE__________________________________  ________________________________________________________________________

## 2016-12-01 ENCOUNTER — Encounter: Payer: Self-pay | Admitting: Internal Medicine

## 2016-12-01 ENCOUNTER — Encounter (HOSPITAL_COMMUNITY): Payer: Self-pay

## 2016-12-01 ENCOUNTER — Ambulatory Visit (HOSPITAL_COMMUNITY)
Admission: RE | Admit: 2016-12-01 | Discharge: 2016-12-01 | Disposition: A | Discharge status: Home / Self Care | Payer: Self-pay | Source: Ambulatory Visit | Attending: Urology | Admitting: Urology

## 2016-12-01 ENCOUNTER — Ambulatory Visit (INDEPENDENT_AMBULATORY_CARE_PROVIDER_SITE_OTHER): Payer: Self-pay | Admitting: Internal Medicine

## 2016-12-01 ENCOUNTER — Encounter (HOSPITAL_COMMUNITY)
Admission: RE | Admit: 2016-12-01 | Discharge: 2016-12-01 | Disposition: A | Discharge status: Home / Self Care | Payer: Self-pay | Source: Ambulatory Visit | Attending: Urology | Admitting: Urology

## 2016-12-01 VITALS — BP 143/82 | HR 89 | Temp 97.6°F | Ht 62.0 in | Wt 111.0 lb

## 2016-12-01 DIAGNOSIS — B081 Molluscum contagiosum: Secondary | ICD-10-CM

## 2016-12-01 DIAGNOSIS — Z01811 Encounter for preprocedural respiratory examination: Secondary | ICD-10-CM

## 2016-12-01 DIAGNOSIS — Z7185 Encounter for immunization safety counseling: Secondary | ICD-10-CM | POA: Insufficient documentation

## 2016-12-01 DIAGNOSIS — Z7189 Other specified counseling: Secondary | ICD-10-CM

## 2016-12-01 DIAGNOSIS — I498 Other specified cardiac arrhythmias: Secondary | ICD-10-CM | POA: Insufficient documentation

## 2016-12-01 DIAGNOSIS — B20 Human immunodeficiency virus [HIV] disease: Secondary | ICD-10-CM

## 2016-12-01 DIAGNOSIS — Z01812 Encounter for preprocedural laboratory examination: Secondary | ICD-10-CM | POA: Insufficient documentation

## 2016-12-01 DIAGNOSIS — N200 Calculus of kidney: Secondary | ICD-10-CM | POA: Insufficient documentation

## 2016-12-01 DIAGNOSIS — Z9189 Other specified personal risk factors, not elsewhere classified: Secondary | ICD-10-CM

## 2016-12-01 LAB — CBC WITH DIFFERENTIAL/PLATELET
BASOS ABS: 30 {cells}/uL (ref 0–200)
BASOS PCT: 0.6 %
EOS ABS: 300 {cells}/uL (ref 15–500)
Eosinophils Relative: 6 %
HEMATOCRIT: 34 % — AB (ref 35.0–45.0)
HEMOGLOBIN: 11.2 g/dL — AB (ref 11.7–15.5)
LYMPHS ABS: 1145 {cells}/uL (ref 850–3900)
MCH: 28.1 pg (ref 27.0–33.0)
MCHC: 32.9 g/dL (ref 32.0–36.0)
MCV: 85.4 fL (ref 80.0–100.0)
MPV: 11.6 fL (ref 7.5–12.5)
Monocytes Relative: 8.9 %
NEUTROS ABS: 3080 {cells}/uL (ref 1500–7800)
Neutrophils Relative %: 61.6 %
Platelets: 244 10*3/uL (ref 140–400)
RBC: 3.98 10*6/uL (ref 3.80–5.10)
RDW: 14.7 % (ref 11.0–15.0)
Total Lymphocyte: 22.9 %
WBC mixed population: 445 cells/uL (ref 200–950)
WBC: 5 10*3/uL (ref 3.8–10.8)

## 2016-12-01 LAB — PROTIME-INR
INR: 1.1
PROTHROMBIN TIME: 14.1 s (ref 11.4–15.2)

## 2016-12-01 LAB — COMPLETE METABOLIC PANEL WITH GFR
AG RATIO: 0.9 (calc) — AB (ref 1.0–2.5)
ALT: 36 U/L — AB (ref 6–29)
AST: 59 U/L — ABNORMAL HIGH (ref 10–30)
Albumin: 4.1 g/dL (ref 3.6–5.1)
Alkaline phosphatase (APISO): 104 U/L (ref 33–115)
BUN: 11 mg/dL (ref 7–25)
CALCIUM: 9.5 mg/dL (ref 8.6–10.2)
CO2: 21 mmol/L (ref 20–32)
CREATININE: 0.85 mg/dL (ref 0.50–1.10)
Chloride: 104 mmol/L (ref 98–110)
GFR, EST AFRICAN AMERICAN: 110 mL/min/{1.73_m2} (ref >=60)
GFR, EST NON AFRICAN AMERICAN: 95 mL/min/{1.73_m2} (ref >=60)
GLUCOSE: 94 mg/dL (ref 65–99)
Globulin: 4.5 g/dL (calc) — ABNORMAL HIGH (ref 1.9–3.7)
Potassium: 4.3 mmol/L (ref 3.5–5.3)
Sodium: 134 mmol/L — ABNORMAL LOW (ref 135–146)
TOTAL PROTEIN: 8.6 g/dL — AB (ref 6.1–8.1)
Total Bilirubin: 0.5 mg/dL (ref 0.2–1.2)

## 2016-12-01 LAB — HCG, SERUM, QUALITATIVE: Preg, Serum: NEGATIVE

## 2016-12-01 NOTE — Assessment & Plan Note (Signed)
Should start to clear on ARVs after several months

## 2016-12-01 NOTE — Assessment & Plan Note (Signed)
Will check her labs today to confirm she is taking the medications.  rtc 4 weeks

## 2016-12-01 NOTE — Assessment & Plan Note (Signed)
Counseled on the flu shot, refused.

## 2016-12-01 NOTE — Assessment & Plan Note (Signed)
Continue bactrim

## 2016-12-01 NOTE — Progress Notes (Signed)
   Subjective:    Patient ID: France Ravens, female    DOB: [birth year], 26 y.o.   MRN: [id number]  HPI Here for follow up of HIV I saw her after a long abscence earlier this month.  She did not again have coverage and had been off her medications for months, though I suspect she had not been on them even longer.  She started [place] as a bridge and then to [place] now and has it.  Taking Bactrim as well.  Molluscum on face.  No weight loss.  No associated n/v/d.     Review of Systems  Constitutional: Negative for fatigue.  Gastrointestinal: Negative for diarrhea.  Skin: Negative for rash.  Neurological: Negative for dizziness.       Objective:   Physical Exam  Constitutional: She appears well-developed and well-nourished. No distress.  Eyes: No scleral icterus.  Cardiovascular: Normal rate, regular rhythm and normal heart sounds.   No murmur heard. Pulmonary/Chest: Effort normal and breath sounds normal. No respiratory distress.  Lymphadenopathy:    She has no cervical adenopathy.   SH: no tobacco       Assessment & Plan:

## 2016-12-02 ENCOUNTER — Other Ambulatory Visit: Payer: Self-pay | Admitting: Radiology

## 2016-12-02 LAB — T-HELPER CELL (CD4) - (RCID CLINIC ONLY)
CD4 T CELL HELPER: 8 % — AB (ref 33–55)
CD4 T Cell Abs: 90 /uL — ABNORMAL LOW (ref 400–2700)

## 2016-12-05 ENCOUNTER — Encounter (HOSPITAL_COMMUNITY): Payer: Self-pay | Admitting: *Deleted

## 2016-12-05 ENCOUNTER — Ambulatory Visit (HOSPITAL_COMMUNITY)
Admission: RE | Admit: 2016-12-05 | Discharge: 2016-12-05 | Disposition: A | Discharge status: Home / Self Care | Payer: Self-pay | Source: Ambulatory Visit | Attending: Urology | Admitting: Urology

## 2016-12-05 ENCOUNTER — Observation Stay (HOSPITAL_COMMUNITY)
Admission: RE | Admit: 2016-12-05 | Discharge: 2016-12-06 | Disposition: A | Discharge status: Home / Self Care | Payer: Self-pay | Source: Ambulatory Visit | Attending: Urology | Admitting: Urology

## 2016-12-05 ENCOUNTER — Ambulatory Visit (HOSPITAL_COMMUNITY): Payer: Self-pay

## 2016-12-05 ENCOUNTER — Ambulatory Visit (HOSPITAL_COMMUNITY): Payer: Self-pay | Admitting: Anesthesiology

## 2016-12-05 ENCOUNTER — Encounter (HOSPITAL_COMMUNITY)
Admission: RE | Disposition: A | Discharge status: Home / Self Care | Payer: Self-pay | Source: Ambulatory Visit | Attending: Urology

## 2016-12-05 DIAGNOSIS — Z79899 Other long term (current) drug therapy: Secondary | ICD-10-CM | POA: Insufficient documentation

## 2016-12-05 DIAGNOSIS — Z79891 Long term (current) use of opiate analgesic: Secondary | ICD-10-CM | POA: Insufficient documentation

## 2016-12-05 DIAGNOSIS — R8271 Bacteriuria: Secondary | ICD-10-CM | POA: Insufficient documentation

## 2016-12-05 DIAGNOSIS — N200 Calculus of kidney: Secondary | ICD-10-CM

## 2016-12-05 DIAGNOSIS — B20 Human immunodeficiency virus [HIV] disease: Secondary | ICD-10-CM | POA: Insufficient documentation

## 2016-12-05 HISTORY — DX: Personal history of urinary calculi: Z87.442

## 2016-12-05 HISTORY — PX: NEPHROLITHOTOMY: SHX5134

## 2016-12-05 HISTORY — PX: IR URETERAL STENT PLACEMENT EXISTING ACCESS RIGHT: IMG6074

## 2016-12-05 HISTORY — PX: IR DIL URETER RIGHT: IMG2265

## 2016-12-05 LAB — BASIC METABOLIC PANEL
Anion gap: 7 (ref 5–15)
BUN: 9 mg/dL (ref 6–20)
CO2: 21 mmol/L — ABNORMAL LOW (ref 22–32)
Calcium: 8.9 mg/dL (ref 8.9–10.3)
Chloride: 106 mmol/L (ref 101–111)
Creatinine, Ser: 0.78 mg/dL (ref 0.44–1.00)
GFR calc Af Amer: >60 mL/min (ref >60)
GFR calc non Af Amer: >60 mL/min (ref >60)
Glucose, Bld: 107 mg/dL — ABNORMAL HIGH (ref 65–99)
Potassium: 4.6 mmol/L (ref 3.5–5.1)
Sodium: 134 mmol/L — ABNORMAL LOW (ref 135–145)

## 2016-12-05 LAB — HIV-1 RNA QUANT-NO REFLEX-BLD
HIV 1 RNA QUANT: 59 {copies}/mL — AB
HIV-1 RNA Quant, Log: 1.77 Log copies/mL — ABNORMAL HIGH

## 2016-12-05 LAB — CBC
HCT: 30.8 % — ABNORMAL LOW (ref 36.0–46.0)
Hemoglobin: 10.4 g/dL — ABNORMAL LOW (ref 12.0–15.0)
MCH: 28.1 pg (ref 26.0–34.0)
MCHC: 33.8 g/dL (ref 30.0–36.0)
MCV: 83.2 fL (ref 78.0–100.0)
Platelets: 229 10*3/uL (ref 150–400)
RBC: 3.7 MIL/uL — ABNORMAL LOW (ref 3.87–5.11)
RDW: 15.1 % (ref 11.5–15.5)
WBC: 7.9 10*3/uL (ref 4.0–10.5)

## 2016-12-05 SURGERY — NEPHROLITHOTOMY PERCUTANEOUS
Anesthesia: General | Laterality: Right

## 2016-12-05 MED ORDER — MIDAZOLAM HCL 5 MG/5ML IJ SOLN
INTRAMUSCULAR | Status: DC | PRN
  Administered 2016-12-05: 2 mg via INTRAVENOUS

## 2016-12-05 MED ORDER — SCOPOLAMINE 1 MG/3DAYS TD PT72
1.0000 | MEDICATED_PATCH | Freq: Once | TRANSDERMAL | Status: DC
  Administered 2016-12-05: 1.5 mg via TRANSDERMAL

## 2016-12-05 MED ORDER — DEXAMETHASONE SODIUM PHOSPHATE 10 MG/ML IJ SOLN
INTRAMUSCULAR | Status: DC | PRN
  Administered 2016-12-05: 10 mg via INTRAVENOUS

## 2016-12-05 MED ORDER — DEXTROSE 5 % IV SOLN
1.0000 g | Freq: Once | INTRAVENOUS | Status: AC
  Administered 2016-12-05: 2 g via INTRAVENOUS
  Filled 2016-12-05: qty 1

## 2016-12-05 MED ORDER — FENTANYL CITRATE (PF) 100 MCG/2ML IJ SOLN
INTRAMUSCULAR | Status: AC
  Filled 2016-12-05: qty 2

## 2016-12-05 MED ORDER — ACETAMINOPHEN 500 MG PO TABS
1000.0000 mg | ORAL_TABLET | Freq: Four times a day (QID) | ORAL | Status: DC
  Administered 2016-12-05 – 2016-12-06 (×3): 1000 mg via ORAL
  Filled 2016-12-05 (×4): qty 2

## 2016-12-05 MED ORDER — OXYBUTYNIN CHLORIDE ER 5 MG PO TB24
10.0000 mg | ORAL_TABLET | Freq: Every day | ORAL | Status: DC
  Administered 2016-12-05 – 2016-12-06 (×2): 10 mg via ORAL
  Filled 2016-12-05 (×2): qty 2

## 2016-12-05 MED ORDER — PROPOFOL 10 MG/ML IV BOLUS
INTRAVENOUS | Status: DC | PRN
  Administered 2016-12-05: 150 mg via INTRAVENOUS

## 2016-12-05 MED ORDER — PROPOFOL 10 MG/ML IV BOLUS
INTRAVENOUS | Status: AC
  Filled 2016-12-05: qty 20

## 2016-12-05 MED ORDER — SODIUM CHLORIDE 0.9% FLUSH: 3.0000 mL | Freq: Two times a day (BID) | INTRAVENOUS | Status: DC

## 2016-12-05 MED ORDER — LIDOCAINE 2% (20 MG/ML) 5 ML SYRINGE
INTRAMUSCULAR | Status: DC | PRN
  Administered 2016-12-05: 100 mg via INTRAVENOUS

## 2016-12-05 MED ORDER — DEXTROSE 5 % IV SOLN
1.0000 g | Freq: Three times a day (TID) | INTRAVENOUS | Status: DC
  Administered 2016-12-05 – 2016-12-06 (×2): 1 g via INTRAVENOUS
  Filled 2016-12-05 (×3): qty 1

## 2016-12-05 MED ORDER — DEXTROSE-NACL 5-0.9 % IV SOLN
INTRAVENOUS | Status: DC
  Administered 2016-12-05 – 2016-12-06 (×2): via INTRAVENOUS

## 2016-12-05 MED ORDER — FENTANYL CITRATE (PF) 100 MCG/2ML IJ SOLN
INTRAMUSCULAR | Status: DC | PRN
  Administered 2016-12-05 (×5): 50 ug via INTRAVENOUS

## 2016-12-05 MED ORDER — BICTEGRAVIR-EMTRICITAB-TENOFOV 50-200-25 MG PO TABS
1.0000 | ORAL_TABLET | Freq: Every day | ORAL | Status: DC
  Administered 2016-12-06: 1 via ORAL
  Filled 2016-12-05 (×2): qty 1

## 2016-12-05 MED ORDER — ROCURONIUM BROMIDE 50 MG/5ML IV SOSY
PREFILLED_SYRINGE | INTRAVENOUS | Status: DC | PRN
  Administered 2016-12-05: 10 mg via INTRAVENOUS
  Administered 2016-12-05: 40 mg via INTRAVENOUS

## 2016-12-05 MED ORDER — SUGAMMADEX SODIUM 200 MG/2ML IV SOLN
INTRAVENOUS | Status: DC | PRN
  Administered 2016-12-05: 150 mg via INTRAVENOUS

## 2016-12-05 MED ORDER — SODIUM CHLORIDE 0.9 % IV SOLN: 250.0000 mL | INTRAVENOUS | Status: DC | PRN

## 2016-12-05 MED ORDER — SUGAMMADEX SODIUM 200 MG/2ML IV SOLN
INTRAVENOUS | Status: AC
  Filled 2016-12-05: qty 2

## 2016-12-05 MED ORDER — MEPERIDINE HCL 50 MG/ML IJ SOLN
INTRAMUSCULAR | Status: AC
  Filled 2016-12-05: qty 1

## 2016-12-05 MED ORDER — SODIUM CHLORIDE 0.9 % IV SOLN: INTRAVENOUS | Status: DC

## 2016-12-05 MED ORDER — SODIUM CHLORIDE 0.9% FLUSH: 3.0000 mL | INTRAVENOUS | Status: DC | PRN

## 2016-12-05 MED ORDER — MEPERIDINE HCL 50 MG/ML IJ SOLN
6.2500 mg | INTRAMUSCULAR | Status: DC | PRN
  Administered 2016-12-05: 12.5 mg via INTRAVENOUS

## 2016-12-05 MED ORDER — OXYCODONE-ACETAMINOPHEN 5-325 MG PO TABS: 1.0000 | ORAL_TABLET | ORAL | Status: DC | PRN

## 2016-12-05 MED ORDER — DEXAMETHASONE SODIUM PHOSPHATE 10 MG/ML IJ SOLN
INTRAMUSCULAR | Status: AC
  Filled 2016-12-05: qty 1

## 2016-12-05 MED ORDER — ONDANSETRON HCL 4 MG/2ML IJ SOLN
INTRAMUSCULAR | Status: DC | PRN
  Administered 2016-12-05: 4 mg via INTRAVENOUS

## 2016-12-05 MED ORDER — FENTANYL CITRATE (PF) 100 MCG/2ML IJ SOLN
25.0000 ug | INTRAMUSCULAR | Status: DC | PRN
  Administered 2016-12-05 (×2): 50 ug via INTRAVENOUS
  Administered 2016-12-05: 25 ug via INTRAVENOUS
  Administered 2016-12-05: 50 ug via INTRAVENOUS
  Administered 2016-12-05: 25 ug via INTRAVENOUS

## 2016-12-05 MED ORDER — SODIUM CHLORIDE 0.9 % IR SOLN
Status: DC | PRN
  Administered 2016-12-05: 9000 mL
  Administered 2016-12-05: 6000 mL

## 2016-12-05 MED ORDER — SUCCINYLCHOLINE CHLORIDE 200 MG/10ML IV SOSY
PREFILLED_SYRINGE | INTRAVENOUS | Status: AC
  Filled 2016-12-05: qty 10

## 2016-12-05 MED ORDER — ONDANSETRON HCL 4 MG/2ML IJ SOLN
INTRAMUSCULAR | Status: AC
  Filled 2016-12-05: qty 2

## 2016-12-05 MED ORDER — ROCURONIUM BROMIDE 50 MG/5ML IV SOSY
PREFILLED_SYRINGE | INTRAVENOUS | Status: AC
  Filled 2016-12-05: qty 5

## 2016-12-05 MED ORDER — LIDOCAINE 2% (20 MG/ML) 5 ML SYRINGE
INTRAMUSCULAR | Status: AC
  Filled 2016-12-05: qty 5

## 2016-12-05 MED ORDER — SUCCINYLCHOLINE CHLORIDE 200 MG/10ML IV SOSY
PREFILLED_SYRINGE | INTRAVENOUS | Status: DC | PRN
  Administered 2016-12-05: 100 mg via INTRAVENOUS

## 2016-12-05 MED ORDER — LACTATED RINGERS IV SOLN
INTRAVENOUS | Status: DC
  Administered 2016-12-05 (×2): via INTRAVENOUS

## 2016-12-05 MED ORDER — SCOPOLAMINE 1 MG/3DAYS TD PT72
MEDICATED_PATCH | TRANSDERMAL | Status: AC
  Filled 2016-12-05: qty 1

## 2016-12-05 MED ORDER — MORPHINE SULFATE (PF) 4 MG/ML IV SOLN
2.0000 mg | INTRAVENOUS | Status: DC | PRN
  Administered 2016-12-05 (×2): 2 mg via INTRAVENOUS
  Filled 2016-12-05 (×2): qty 1

## 2016-12-05 MED ORDER — MIDAZOLAM HCL 2 MG/2ML IJ SOLN
INTRAMUSCULAR | Status: AC
  Filled 2016-12-05: qty 2

## 2016-12-05 MED ORDER — PROMETHAZINE HCL 25 MG/ML IJ SOLN: 6.2500 mg | INTRAMUSCULAR | Status: DC | PRN

## 2016-12-05 MED ORDER — FENTANYL CITRATE (PF) 250 MCG/5ML IJ SOLN
INTRAMUSCULAR | Status: AC
  Filled 2016-12-05: qty 5

## 2016-12-05 MED ORDER — ONDANSETRON HCL 4 MG/2ML IJ SOLN: 4.0000 mg | INTRAMUSCULAR | Status: DC | PRN

## 2016-12-05 SURGICAL SUPPLY — 44 items
APPLICATOR SURGIFLO ENDO (HEMOSTASIS) IMPLANT
BAG URINE DRAINAGE (UROLOGICAL SUPPLIES) IMPLANT
BASKET LASER NITINOL 1.9FR (BASKET) ×2 IMPLANT
BASKET ZERO TIP NITINOL 2.4FR (BASKET) IMPLANT
BENZOIN TINCTURE PRP APPL 2/3 (GAUZE/BANDAGES/DRESSINGS) ×2 IMPLANT
BLADE SURG 15 STRL LF DISP TIS (BLADE) ×1 IMPLANT
BLADE SURG 15 STRL SS (BLADE) ×1
CATH FOLEY 2W COUNCIL 20FR 5CC (CATHETERS) IMPLANT
CATH FOLEY 2WAY SLVR  5CC 18FR (CATHETERS)
CATH FOLEY 2WAY SLVR 5CC 18FR (CATHETERS) IMPLANT
CATH IMAGER II 65CM (CATHETERS) IMPLANT
CATH X-FORCE N30 NEPHROSTOMY (TUBING) ×2 IMPLANT
COVER SURGICAL LIGHT HANDLE (MISCELLANEOUS) ×2 IMPLANT
DERMABOND ADVANCED (GAUZE/BANDAGES/DRESSINGS) ×1
DERMABOND ADVANCED .7 DNX12 (GAUZE/BANDAGES/DRESSINGS) ×1 IMPLANT
DRAPE C-ARM 42X120 X-RAY (DRAPES) ×2 IMPLANT
DRAPE LINGEMAN PERC (DRAPES) ×2 IMPLANT
DRAPE SURG IRRIG POUCH 19X23 (DRAPES) ×2 IMPLANT
DRSG PAD ABDOMINAL 8X10 ST (GAUZE/BANDAGES/DRESSINGS) IMPLANT
DRSG TEGADERM 8X12 (GAUZE/BANDAGES/DRESSINGS) IMPLANT
FIBER LASER TRAC TIP (UROLOGICAL SUPPLIES) IMPLANT
GAUZE SPONGE 4X4 12PLY STRL (GAUZE/BANDAGES/DRESSINGS) IMPLANT
GLOVE BIOGEL M 8.0 STRL (GLOVE) ×2 IMPLANT
GOWN STRL REUS W/TWL XL LVL3 (GOWN DISPOSABLE) ×2 IMPLANT
GUIDEWIRE STR DUAL SENSOR (WIRE) IMPLANT
HOLDER FOLEY CATH W/STRAP (MISCELLANEOUS) ×2 IMPLANT
KIT BASIN OR (CUSTOM PROCEDURE TRAY) ×2 IMPLANT
MANIFOLD NEPTUNE II (INSTRUMENTS) ×2 IMPLANT
NS IRRIG 1000ML POUR BTL (IV SOLUTION) IMPLANT
PACK CYSTO (CUSTOM PROCEDURE TRAY) ×2 IMPLANT
PROBE LITHOCLAST ULTRA 3.8X403 (UROLOGICAL SUPPLIES) ×2 IMPLANT
PROBE PNEUMATIC 1.0MMX570MM (UROLOGICAL SUPPLIES) ×2 IMPLANT
SET IRRIG Y TYPE TUR BLADDER L (SET/KITS/TRAYS/PACK) ×2 IMPLANT
SHEATH PEELAWAY SET 9 (SHEATH) ×2 IMPLANT
STONE CATCHER W/TUBE ADAPTER (UROLOGICAL SUPPLIES) ×2 IMPLANT
SURGIFLO W/THROMBIN 8M KIT (HEMOSTASIS) IMPLANT
SUT MNCRL AB 4-0 PS2 18 (SUTURE) ×2 IMPLANT
SUT SILK 2 0 30  PSL (SUTURE) ×1
SUT SILK 2 0 30 PSL (SUTURE) ×1 IMPLANT
SYR 10ML LL (SYRINGE) ×2 IMPLANT
SYR 20CC LL (SYRINGE) ×4 IMPLANT
TOWEL OR NON WOVEN STRL DISP B (DISPOSABLE) ×2 IMPLANT
TRAY FOLEY W/METER SILVER 16FR (SET/KITS/TRAYS/PACK) ×2 IMPLANT
TUBING CONNECTING 10 (TUBING) ×4 IMPLANT

## 2016-12-05 NOTE — Transfer of Care (Signed)
Immediate Anesthesia Transfer of Care Note  Patient: Kimberly Bishop  Procedure(s) Performed: Procedure(s): NEPHROLITHOTOMY PERCUTANEOUS (Right)  Patient Location: PACU  Anesthesia Type:General  Level of Consciousness:  sedated, patient cooperative and responds to stimulation  Airway & Oxygen Therapy:Patient Spontanous Breathing and Patient connected to face mask oxgen  Post-op Assessment:  Report given to PACU RN and Post -op Vital signs reviewed and stable  Post vital signs:  Reviewed and stable  Last Vitals:  Vitals:   12/05/16 0907  BP: 118/86  Pulse: 90  Resp: 16  SpO2: 373%    Complications: No apparent anesthesia complications

## 2016-12-05 NOTE — Progress Notes (Signed)
Urology Progress Note   Post-op day 0 R PCNL  Subjective: No complaints, doing well.   Objective: Vital signs in last 24 hours: Temp:  [97.6 F (36.4 C)-97.9 F (36.6 C)] 97.9 F (36.6 C) (09/24 1458) Pulse Rate:  [73-101] 82 (09/24 1430) Resp:  [13-22] 14 (09/24 1430) BP: (118-152)/(75-93) 123/86 (09/24 1430) SpO2:  [100 %] 100 % (09/24 1430)  Intake/Output from previous day: No intake/output data recorded. Intake/Output this shift: Total I/O In: 1500 [I.V.:1400; IV Piggyback:100] Out: 330 [Urine:130; Blood:200]  Physical Exam:  General: Alert and oriented CV: RRR Chest: Bilateral nipple piercings. Small amount of exposed granulation tissue around left nipple ring, no evidence of infection or drainage.  Lungs: Clear Abdomen: Soft, appropriately tender. Incisions c/d/i. GU: Foley in place draining clear pink urine  Ext: NT, No erythema  Lab Results: No results for input(s): HGB, HCT in the last 72 hours. BMET No results for input(s): NA, K, CL, CO2, GLUCOSE, BUN, CREATININE, CALCIUM in the last 72 hours.   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 Patient had placement of a percutaneous nephrostomy tube and a ureteral stent. Patient is scheduled for nephrolithotomy procedure. Percutaneous nephrostomy tube needs to be exchanged for a ureteral catheter for the nephrolithotomy procedure. EXAM: EXCHANGE OF PERCUTANEOUS NEPHROSTOMY TUBE FOR AN ANTEGRADE URETER CATHETER WITH FLUOROSCOPY COMPARISON:  None. MEDICATIONS: See operative report ANESTHESIA/SEDATION: General anesthesia CONTRAST:  30 mL - administered into the collecting system(s) FLUOROSCOPY TIME:  Fluoroscopy Time: 8 minutes 17 seconds (183 mGy). COMPLICATIONS: None immediate. PROCEDURE: Informed written consent was obtained from the patient after a thorough discussion of the procedural risks, benefits and alternatives. All questions were  addressed. Maximal Sterile Barrier Technique was utilized including caps, mask, sterile gowns, sterile gloves, sterile drape, hand hygiene and skin antiseptic. A timeout was performed prior to the initiation of the procedure. Patient was under general anesthesia and placed in a prone position. The right flank and existing catheter were prepped and draped in sterile fashion. Contrast injection confirmed placement within the right renal collecting system. The catheter was cut and removed over a Bentson wire. A 5 French catheter was advanced into the collecting system. It was very difficult to advance the catheter beyond the staghorn calculus. Eventually, a Glidewire and catheter were advanced beyond this stone and into the renal pelvic region. Contrast injection confirmed placement in the collecting system. A stiff Amplatz wire was advanced down the ureter. The catheter was removed for a peel-away sheath. A second Amplatz wire was placed as a safety wire. At this point, the nephrolithotomy procedure was performed by Dr. Karsten Ro. FINDINGS: Nephrostomy tube was coiled within a dilated lower pole calyx. A retrograde placed ureter stent was again noted. It was technically difficult to advance a catheter and wire beyond the stone. There was extravasation of contrast outside of the collecting system during catheter placement. Catheter placement confirmed in the proximal ureter. Catheter was advanced into the distal ureter. Contrast filled multiple dilated calices. The percutaneous access was dilated with a balloon catheter. Final intraoperative image demonstrates removal of the large renal calculus. IMPRESSION: Successful conversion of the right nephrostomy tube for a ureter catheter. Right nephrolithotomy procedure performed by Dr. Karsten Ro and see operative report. Electronically Signed   By: Kimberly Bishop M.D.   On: 12/05/2016 14:09   Dg C-arm 1-60 Min-no Report  Result Date: 12/05/2016 Fluoroscopy was utilized by the  requesting physician.  No radiographic interpretation.  Assessment/Plan:  26 y.o. female s/p R PCNL 12/05/16.  Overall doing well post-op. Report of small volume purulent drainage around left nipple ring. On exam, I was unable to express any fluid. Non-infected appearing.   - Continue to monitor - AM CT scan to evaluate residual stone burden   Dispo: Floor   LOS: 0 days   Kimberly Bishop 12/05/2016, 3:12 PM

## 2016-12-05 NOTE — H&P (View-Only) (Signed)
2 Days Post-Op   Subjective/Chief Complaint:  1 - Urosepsis - fevers, tachycardia at presentation with bacteruria and partial Rt renal obsstruction. UCX 8/23 from [place] office pending. Placed on empiric Vanc + Rocephin per pharmacy in addition to her baseline HAART therapy for HIV. [facility] 8/24 pending.  2 - Rt Staghorn Kidney Stone- large Rt staghorn stone by CT 8/23 on eval urosepsis and flank pain. Rt JJ stent placed 8/23 for urgent renal decompression.  Today "Kimberly Bishop" is stable. Fever curve appears to be trending down. CX's still pending.    Objective: Vital signs in last 24 hours: Temp:  [98.8 F (37.1 C)-102 F (38.9 C)] 98.8 F (37.1 C) (08/25 0442) Pulse Rate:  [94-110] 94 (08/25 0442) Resp:  [15] 15 (08/25 0442) BP: (101-119)/(59-66) 102/59 (08/25 0442) SpO2:  [94 %-99 %] 94 % (08/25 0442) Last BM Date: 10/26/16  Intake/Output from previous day: 08/24 0701 - 08/25 0700 In: 4116.7 [P.O.:960; I.V.:2956.7; IV Piggyback:200] Out: 1900 [Urine:1900] Intake/Output this shift: No intake/output data recorded.  General appearance: alert and cooperative Eyes: negative Nose: Nares normal. Septum midline. Mucosa normal. No drainage or sinus tenderness. Throat: lips, mucosa, and tongue normal; teeth and gums normal Neck: supple, symmetrical, trachea midline Back: symmetric, no curvature. ROM normal. No CVA tenderness. Resp: non-labored on room air.  Cardio: Nl rate GI: soft, non-tender; bowel sounds normal; no masses,  no organomegaly Pelvic: external genitalia normal and foley in palce wtih yellow urine and scant debris.  Extremities: extremities normal, atraumatic, no cyanosis or edema Skin: Skin color, texture, turgor normal. No rashes or lesions Lymph nodes: Cervical, supraclavicular, and axillary nodes normal. Neurologic: Grossly normal  Lab Results:   Recent Labs  11/03/16 2133 11/04/16 0458  WBC 8.0 5.3  HGB 13.0 10.7*  HCT 37.2 31.6*  PLT 181 177    BMET  Recent Labs  11/03/16 1721 11/04/16 0458  NA 135 133*  K 4.1 3.4*  CL 98* 103  CO2 23 22  GLUCOSE 91 117*  BUN 15 12  CREATININE 1.05* 0.91  CALCIUM 9.2 7.9*   PT/INR No results for input(s): LABPROT, INR in the last 72 hours. ABG No results for input(s): PHART, HCO3 in the last 72 hours.  Invalid input(s): PCO2, PO2  Studies/Results: Ct Abdomen Pelvis Wo Contrast  Result Date: 11/04/2016 CLINICAL DATA:  Generalized abdominal pain beginning yesterday. One day status post right ureteral stent placement for staghorn calculus. HIV. EXAM: CT ABDOMEN AND PELVIS WITHOUT CONTRAST TECHNIQUE: Multidetector CT imaging of the abdomen and pelvis was performed following the standard protocol without IV contrast. COMPARISON:  01/13/2009 FINDINGS: Lower chest: No acute findings. Hepatobiliary:  No masses visualized on this unenhanced exam. Pancreas: No mass or inflammatory process visualized on this unenhanced exam. Spleen:  Within normal limits in size. Adrenals/Urinary tract: Partial staghorn calculus is seen in the right renal pelvis and lower pole collecting system. A right ureteral stent is seen in place with proximal tip of the stent in the upper pole collecting system. Mild-to-moderate right pelvicaliectasis is seen with high attenuation hemorrhage in several lower pole calices. No evidence of perinephric or retroperitoneal hemorrhage. Foley catheter seen within empty urinary bladder. Stomach/Bowel: No evidence of obstruction, inflammatory process, or abnormal fluid collections. Vascular/Lymphatic: No pathologically enlarged lymph nodes identified. No evidence of abdominal aortic aneurysm. Reproductive:  No mass or other significant abnormality. Other:  None. Musculoskeletal:  No suspicious bone lesions identified. IMPRESSION: Partial staghorn calculus in right renal collecting system, with mild to moderate pelvicaliectasis. Internal  right ureteral stent, with hemorrhage within several  lower pole calices in the right renal collecting system. No evidence of perinephric or retroperitoneal hemorrhage. Electronically Signed   By: Earle Gell M.D.   On: 11/04/2016 08:58   Dg C-arm 1-60 Min  Result Date: 11/03/2016 CLINICAL DATA:  Right ureteral stent placement. EXAM: DG C-ARM 61-120 MIN FLUOROSCOPY TIME:  48 seconds. COMPARISON:  Ultrasound of October 26, 2016. FINDINGS: Ten fluoroscopic images were obtained during cystoscopy. These images demonstrate large staghorn type calculus in right kidney. Contrast was injected into the ureter. Catheter tip is seen in the right renal pelvis. IMPRESSION: Large right-sided staghorn calculus is noted. Electronically Signed   By: Marijo Conception, M.D.   On: 11/03/2016 21:28    Anti-infectives: Anti-infectives    Start     Dose/Rate Route Frequency Ordered Stop   11/04/16 1930  cefTRIAXone (ROCEPHIN) 2 g in dextrose 5 % 50 mL IVPB     2 g 100 mL/hr over 30 Minutes Intravenous Every 24 hours 11/03/16 1918     11/04/16 1200  emtricitabine-rilpivir-tenofovir AF (ODEFSEY) 200-25-25 MG per tablet 1 tablet     1 tablet Oral Daily with breakfast 11/04/16 0749     11/04/16 1000  dolutegravir (TIVICAY) tablet 50 mg     50 mg Oral Daily 11/04/16 0749     11/04/16 0215  vancomycin (VANCOCIN) IVPB 750 mg/150 ml premix     750 mg 150 mL/hr over 60 Minutes Intravenous Every 12 hours 11/04/16 0211     11/03/16 1630  cefTRIAXone (ROCEPHIN) 2 g in dextrose 5 % 50 mL IVPB     2 g 100 mL/hr over 30 Minutes Intravenous 30 min pre-op 11/03/16 1632 11/03/16 1942      Assessment/Plan:  1 - Urosepsis - improving clinically. Reinforced goals of afebrile x 24 hrs and preliminary CX results before discharge. Continue empiric ABX for now.   2 - Rt Staghorn Kidney Stone-  Temporized with Rt stent, will need PCNL in elective setting to address stone in few weeks after clears infectious parameters.   DC foley, dec IVF to 39, Remain in house  [facility],  [place] 11/05/2016

## 2016-12-05 NOTE — Anesthesia Postprocedure Evaluation (Signed)
Anesthesia Post Note  Patient: Kimberly Bishop  Procedure(s) Performed: Procedure(s) (LRB): NEPHROLITHOTOMY PERCUTANEOUS (Right)     Patient location during evaluation: PACU Anesthesia Type: General Level of consciousness: awake and alert Pain management: pain level controlled Vital Signs Assessment: post-procedure vital signs reviewed and stable Respiratory status: spontaneous breathing, nonlabored ventilation and respiratory function stable Cardiovascular status: blood pressure returned to baseline and stable Postop Assessment: no apparent nausea or vomiting Anesthetic complications: no    Last Vitals:  Vitals:   12/05/16 0907 12/05/16 1317  BP: 118/86   Pulse: 90   Resp: 16   Temp:  (P) 36.4 C  SpO2: 100%     Last Pain:  Vitals:   12/05/16 1345  TempSrc:   PainSc: 7                  Audry Pili

## 2016-12-05 NOTE — Consult Note (Deleted)
#  Hydronephrosis: Presented to ED 10/26/16 w/ nausea, vomiting,and epigastric pain. CBC wnl, Cr at baseline 0.9. UA concerning for infection, however not started on antibiotics. Abdominal ultrasound was obtained which showed right hydroureteroephrosis and several renal stones largest measuring 14mm. However further imaging was not done and patient was discharged with a diagnosis of peptic ulcer disease.   She was seen in follow up in clinic on 11/03/16, at which point she was found to be febrile, and was stented on the right. While admitted, fevers continued, and a repeat CT was performed, which revealed an obstructed right lower pole. A right lower pole PCN was placed on 11/07/16, after which she did well. All cultures negative, on Bactrim   Since discharge, has done well, with only occasional n/v. No fevers. Right PCN draining well.   Negative pregnancy test 11/03/16, no intercourse since.   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 CD4 count 70 in 10/2016. Followed by Dr. Linus Salmons of ID. Per ID, no indication to postpone stone treatment.   26yF with HIV/AIDS, s/p R stent on 11/03/16 and R lower pole PCN placement on 11/07/16 for a right partial staghorn renal stone. All cultures negative during admission. Doing well on Bactrim. PCN draining well.   Again discussed plan for right PCNL in the near future. Discussed R/B, as well as typical post-op course. All questions answered. Will coordinate with IR for antegrade ureteral access.   Continue Bactrim through procedure.

## 2016-12-05 NOTE — Op Note (Signed)
Preoperative Diagnosis:   Right renal stone greater than 2 cm   Postoperative Diagnosis:   Right renal stone greater than 2 cm   Procedure(s) Performed:   - Right percutaneous nephrostolithotomy for stone burden greater than 2 cm - Nephrostomy tube removal under flouroscopy - Simple Foley catheter placement   Teaching Surgeon:  Kathie Rhodes, M.D.   Resident Surgeon:  Lenna Sciara McCormick,M.D.   Assistants:  None listed   Anesthesia:  General via endotracheal tube.     IV Fluids:  See Anesthesia record.   Estimated Blood Loss:  200 mL's.   Cultures:  None   Drains:   - Right 6Frx24cm JJ ureteral stent - 16 French Foley catheter   Specimens:  Right renal stone for chemical analysis    Complications:  None.   Indications for Surgery:  Kimberly Bishop is a 26 y.o. female with a history of nephrolithiasis. Please see H+P note for further details. Briefly, she was stented on 11/03/16 for an obstructing right sided partial staghorn stone. Required PCN placement 11/07/16 for an obstructed lower pole calyx. Presents today for definitive stone treatment. The risks and benefits of the procedure were discussed with the patient who wishes to proceed.   Operative Findings:  Interventional radiology present for the beginning of the case for antegrade access to the ureter. Successful percutaneous treatment of the entirety of the visible stone burden.  Existing JJ ureteral stent pulled into lower pole calyx to facilitate drainage. Nephrostomy tube tract sealed with Floseal and closed.    Procedure:  The patient was correctly identified in the preoperative holding area where written informed consent as well as potential risks and complications were reviewed. The patient was brought back to the operative suite. Once correct information was verified, general anesthesia was induced via endotracheal tube.  The patient was then gently repositioned into the prone position, paying careful attention  to pad all pressure points and affixed the patient to the bed at multiple points of contact.  She was then prepped and draped in the usual sterile fashion and given appropriate perioperative procedural antibiotics.  Sequential compression devices were placed for VTE prophylaxis. A Foley was placed.  A timeout was then performed.   At the beginning of the case, interventional radiology was present for removal of her nephrostomy tube and gaining antegrade access to there bladder. At the end of their portion of the case, 2 Amplatz super-stiff wires were placed, into the bladder, running the course of the ureter, and exiting the nephrostomy tube tract. The existing JJ stent was noted to be in appropriate position.  Next, we developed our percutaneous tract with the advancement of a 30 French x 20 cm Nephromax balloon dilator over one of the wires.  After this, a 63 French sheath was advanced to the edge of the distal calyx on fluoroscopy.     We then performed rigid nephroscopy with the lithotrite. Immediately upon entrance into the collecting system, we encountered the stone and we then proceeded to treat the stone with lithotripsy. After reaching the limit of the stone capable of being treated with the rigid scope, we then switched to flexible nephroscopy and removed additional stone using the basket. At the conclusion of the procedure, we repeated flexible nephroscopy in the kidney as well and withdrew our sheath over our rigid nephroscope to the edge of renal parenchyma which did not reveal any residual stone fragments.   At this point, we pulled the existing stent back into the lower  pole calyx to facilitate drainage. We then removed the sheath and safety wire, and sealed the nephrostomy tube tract with Floseal. The skin and subcutaneous tissue were closed and covered with dermabond. At this point, the patient was extubated and taken to the recovery area in stable fashion.   I was present and participated  in all aspects of her operation from start to finish.

## 2016-12-05 NOTE — Care Management Note (Signed)
Case Management Note  Patient Details  Name: Kimberly Bishop MRN: [id number] Date of Birth: 05-05-1990  Subjective/Objective: 26 y/o f admitted w/R ureteral stone. S/p R PCNL. From home.                   Action/Plan:d/c plan home.   Expected Discharge Date:                  Expected Discharge Plan:  Home/Self Care  In-House Referral:     Discharge planning Services  CM Consult  Post Acute Care Choice:    Choice offered to:     DME Arranged:    DME Agency:     HH Arranged:    HH Agency:     Status of Service:  In process, will continue to follow  If discussed at Long Length of Stay Meetings, dates discussed:    Additional Comments:  Dessa Phi, RN 12/05/2016, 3:55 PM

## 2016-12-05 NOTE — Anesthesia Procedure Notes (Signed)
Procedure Name: Intubation Date/Time: 12/05/2016 11:01 AM Performed by: Maxwell Caul Pre-anesthesia Checklist: Patient identified, Emergency Drugs available, Suction available and Patient being monitored Patient Re-evaluated:Patient Re-evaluated prior to induction Oxygen Delivery Method: Circle system utilized Preoxygenation: Pre-oxygenation with 100% oxygen Induction Type: IV induction Ventilation: Mask ventilation without difficulty Laryngoscope Size: Mac and 4 Grade View: Grade I Tube type: Oral Tube size: 7.0 mm Number of attempts: 1 Airway Equipment and Method: Stylet Placement Confirmation: ETT inserted through vocal cords under direct vision,  positive ETCO2 and breath sounds checked- equal and bilateral Secured at: 21 cm Tube secured with: Tape Dental Injury: Teeth and Oropharynx as per pre-operative assessment

## 2016-12-05 NOTE — Interval H&P Note (Signed)
History and Physical Interval Note:  12/05/2016 11:05 AM  [place]  has presented today for surgery, with the diagnosis of RIGHT NEPHROLITHIASIS, STAGHORN  The various methods of treatment have been discussed with the patient and family. After consideration of risks, benefits and other options for treatment, the patient has consented to  Procedure(s): NEPHROLITHOTOMY PERCUTANEOUS (Right) as a surgical intervention .  The patient's history has been reviewed, patient examined, no change in status, stable for surgery.  I have reviewed the patient's chart and labs.  Questions were answered to the patient's satisfaction.    #Hydronephrosis: Presented to ED 10/26/16 w/ nausea, vomiting,and epigastric pain. CBC wnl, Cr at baseline 0.9. UA concerning for infection, however not started on antibiotics. Abdominal ultrasound was obtained which showed right hydroureteroephrosis and several renal stones largest measuring 34mm. However further imaging was not done and patient was discharged with a diagnosis of peptic ulcer disease.   She was seen in follow up in clinic on 11/03/16, at which point she was found to be febrile, and was stented on the right. While admitted, fevers continued, and a repeat CT was performed, which revealed an obstructed right lower pole. A right lower pole PCN was placed on 11/07/16, after which she did well. All cultures negative, on Bactrim   Since discharge, has done well, with only occasional n/v. No fevers. Right PCN draining well.   Negative pregnancy test 11/03/16, no intercourse since.   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 CD4 count 70 in 10/2016. Followed by Dr. Linus Salmons of ID. Per ID, no indication to postpone stone treatment.   26yF with HIV/AIDS, s/p R stent on 11/03/16 and R lower pole PCN placement on 11/07/16 for a right partial staghorn renal stone. All cultures negative during admission. Doing well on Bactrim. PCN draining well.   Again discussed  plan for right PCNL in the near future. Discussed R/B, as well as typical post-op course. All questions answered. Will coordinate with IR for antegrade ureteral access.   Continue Bactrim through procedure.    Claybon Jabs

## 2016-12-05 NOTE — Anesthesia Preprocedure Evaluation (Addendum)
Anesthesia Evaluation  Patient identified by MRN, date of birth, ID band Patient awake    Reviewed: Allergy & Precautions, NPO status , Patient's Chart, lab work & pertinent test results  Airway Mallampati: I  TM Distance: <3 FB Neck ROM: Full    Dental no notable dental hx. (+) Teeth Intact, Dental Advisory Given   Pulmonary neg pulmonary ROS,    Pulmonary exam normal breath sounds clear to auscultation       Cardiovascular negative cardio ROS Normal cardiovascular exam Rhythm:Regular Rate:Normal     Neuro/Psych negative neurological ROS  negative psych ROS   GI/Hepatic negative GI ROS, Neg liver ROS,   Endo/Other  negative endocrine ROS  Renal/GU negative Renal ROSRight Nephrolithiasis  negative genitourinary   Musculoskeletal negative musculoskeletal ROS (+)   Abdominal   Peds  Hematology  (+) HIV, Thrombocytopenia AIDS   Anesthesia Other Findings   Reproductive/Obstetrics negative OB ROS                            Anesthesia Physical  Anesthesia Plan  ASA: II  Anesthesia Plan: General   Post-op Pain Management:    Induction: Intravenous  PONV Risk Score and Plan: 4 or greater and Ondansetron, Midazolam, Propofol infusion, Treatment may vary due to age or medical condition, Dexamethasone and Scopolamine patch - Pre-op  Airway Management Planned: Oral ETT  Additional Equipment: None  Intra-op Plan:   Post-operative Plan: Extubation in OR  Informed Consent: I have reviewed the patients History and Physical, chart, labs and discussed the procedure including the risks, benefits and alternatives for the proposed anesthesia with the patient or authorized representative who has indicated his/her understanding and acceptance.   Dental advisory given  Plan Discussed with: CRNA  Anesthesia Plan Comments: (Patient with nipple piercings, states they cannot be removed. Discussed  risks of leaving them in place during surgery, especially while in prone position. Patient accepting of risks.)       Anesthesia Quick Evaluation

## 2016-12-06 ENCOUNTER — Observation Stay (HOSPITAL_COMMUNITY): Payer: Self-pay

## 2016-12-06 ENCOUNTER — Encounter (HOSPITAL_COMMUNITY): Payer: Self-pay | Admitting: Urology

## 2016-12-06 ENCOUNTER — Other Ambulatory Visit (HOSPITAL_COMMUNITY): Payer: Self-pay | Admitting: Urology

## 2016-12-06 DIAGNOSIS — N200 Calculus of kidney: Secondary | ICD-10-CM

## 2016-12-06 LAB — BASIC METABOLIC PANEL
Anion gap: 8 (ref 5–15)
BUN: 9 mg/dL (ref 6–20)
CO2: 20 mmol/L — ABNORMAL LOW (ref 22–32)
Calcium: 9.2 mg/dL (ref 8.9–10.3)
Chloride: 108 mmol/L (ref 101–111)
Creatinine, Ser: 0.79 mg/dL (ref 0.44–1.00)
GFR calc Af Amer: >60 mL/min (ref >60)
GFR calc non Af Amer: >60 mL/min (ref >60)
Glucose, Bld: 115 mg/dL — ABNORMAL HIGH (ref 65–99)
Potassium: 3.9 mmol/L (ref 3.5–5.1)
Sodium: 136 mmol/L (ref 135–145)

## 2016-12-06 LAB — CBC
HCT: 29.1 % — ABNORMAL LOW (ref 36.0–46.0)
Hemoglobin: 10.2 g/dL — ABNORMAL LOW (ref 12.0–15.0)
MCH: 29.1 pg (ref 26.0–34.0)
MCHC: 35.1 g/dL (ref 30.0–36.0)
MCV: 83.1 fL (ref 78.0–100.0)
Platelets: 254 10*3/uL (ref 150–400)
RBC: 3.5 MIL/uL — ABNORMAL LOW (ref 3.87–5.11)
RDW: 15.3 % (ref 11.5–15.5)
WBC: 11.2 10*3/uL — ABNORMAL HIGH (ref 4.0–10.5)

## 2016-12-06 MED ORDER — OXYBUTYNIN CHLORIDE 5 MG PO TABS: 5.0000 mg | ORAL_TABLET | Freq: Three times a day (TID) | ORAL | 11 refills | Status: DC | PRN

## 2016-12-06 MED ORDER — OXYCODONE HCL 5 MG PO CAPS: 5.0000 mg | ORAL_CAPSULE | ORAL | 0 refills | Status: DC | PRN

## 2016-12-06 MED ORDER — TAMSULOSIN HCL 0.4 MG PO CAPS: 0.4000 mg | ORAL_CAPSULE | Freq: Every day | ORAL | 0 refills | Status: AC

## 2016-12-06 MED ORDER — TAMSULOSIN HCL 0.4 MG PO CAPS
0.4000 mg | ORAL_CAPSULE | Freq: Every day | ORAL | 0 refills | Status: DC
Start: 2016-12-06 — End: 2016-12-06

## 2016-12-06 NOTE — Discharge Instructions (Signed)
Post percutaneous nephrolithotomy and stent  placement instructions   We will plan to remove your only remaining stone in 2-3 weeks. We will call you with this date. This will be a day procedure, which you can return home on the same day. You will be asleep, we will look in your kidney with a camera (no incisions), and remove your stone. The whole procedure will take about 1 hour.   Definitions:  Ureter: The duct that transports urine from the kidney to the bladder. Stent: A plastic hollow tube that is placed into the ureter, from the kidney to the bladder to prevent the ureter from swelling shut.  General instructions:  Despite the fact that only a small skin incision was used, the area around the kidney, ureter and bladder is raw and irritated. The stent is a foreign body which can further irritate the bladder wall. This irritation is manifested by increased frequency of urination, both day and night, and by an increase in the urge to urinate. In some, the urge to urinate is present almost always. Sometimes the urge is strong enough that you may not be able to stop your self from urinating. This can often be controlled with medication but does not occur in everyone. A stent can safely be left in place for 3 months or greater.  You may see some blood in your urine while the stent is in place and a few days afterward. Do not be alarmed, even if the urine is clear for a while. Get off your feet and drink lots of fluids until clearing occurs. If you start to pass clots or don't improve, call us.  Diet:  You may return to your normal diet immediately. Because of the raw surface of your bladder, alcohol, spicy foods, foods high in acid and drinks with caffeine may cause irritation or frequency and should be used in moderation. To keep your urine flowing freely and avoid constipation, drink plenty of fluids during the day (8-10 glasses). Tip: Avoid cranberry juice because it is very  acidic.  Activity:  Your physical activity doesn't need to be restricted. However, if you are very active, you may see some blood in the urine. We suggest that you reduce your activity under the circumstances until the bleeding has stopped.  Bowels:  It is important to keep your bowels regular during the postoperative period. Straining with bowel movements can cause bleeding. A bowel movement every other day is reasonable. Use a mild laxative if needed, such as milk of magnesia 2-3 tablespoons, or 2 Dulcolax tablets. Call if you continue to have problems. If you had been taking narcotics for pain, before, during or after your surgery, you may be constipated. Take a laxative if necessary.  Medication:  You should resume your pre-surgery medications unless told not to. In addition you may be given an antibiotic to prevent or treat infection. Antibiotics are not always necessary. All medication should be taken as prescribed until the bottles are finished unless you are having an unusual reaction to one of the drugs.  Problems you should report to [place]:  a. Fever greater than 101F. b. Heavy bleeding, or clots (see notes above about blood in urine). c. Inability to urinate. d. Drug reactions (hives, rash, nausea, vomiting, diarrhea). e. Severe burning or pain with urination that is not improving.

## 2016-12-06 NOTE — Discharge Summary (Signed)
[facility] Discharge Summary  Admit date: 12/05/2016  Discharge date and time: 12/06/16   Discharge to: Home  Discharge Service: Urology  Discharge Attending Physician:  Dr. Karsten Ro  Discharge  Diagnoses: Right nephrolithiasis  Secondary Diagnosis: Active Problems:   Nephrolith   OR Procedures: Procedure(s): NEPHROLITHOTOMY PERCUTANEOUS 12/05/2016   Ancillary Procedures: None   Discharge Day Services: The patient was seen and examined by the Urology team both in the morning and immediately prior to discharge.  Vital signs and laboratory values were stable and within normal limits.  The physical exam was benign and unchanged and all surgical wounds were examined.  Discharge instructions were explained and all questions answered.  Subjective  No acute events overnight. Pain Controlled. No fever or chills.  Objective Patient Vitals for the past 8 hrs:  BP Temp Temp src Pulse Resp SpO2  12/06/16 0941 (!) 113/58 99.4 F (37.4 C) Oral 76 17 100 %  12/06/16 0519 110/61 99.7 F (37.6 C) Oral 72 16 99 %  12/06/16 0407 108/61 98.6 F (37 C) Oral 77 16 100 %   No intake/output data recorded.  General Appearance:        No acute distress Lungs:                       Normal work of breathing on room air Heart:                                Regular rate and rhythm Abdomen:                         Soft, non-tender, non-distended Back:        Former right nephrostomy tube site dressed appropriately Extremities:                      Warm and well perfused   Hospital Course:  The patient underwent a R PCNL on 12/05/2016.  The patient tolerated the procedure well, was extubated in the OR, and afterwards was taken to the PACU for routine post-surgical care. When stable the patient was transferred to the floor.   The patient did well postoperatively.  The patient's diet was slowly advanced and at the time of discharge was tolerating a regular diet. On POD1 she underwent a CT which  revealed a 1cm stone in the right interpole kidney. The patient was discharged home 1 Day Post-Op, at which point was tolerating a regular solid diet, was able to void spontaneously, have adequate pain control with P.O. pain medication, and could ambulate without difficulty. The patient will follow up with [place] in 2-3 weeks for a second look procedure.   Condition at Discharge: Improved  Discharge Medications:  Allergies as of 12/06/2016   No Known Allergies     Medication List    STOP taking these medications   oxyCODONE 5 MG immediate release tablet Commonly known as:  Oxy IR/ROXICODONE Replaced by:  oxycodone 5 MG capsule     TAKE these medications   bictegravir-emtricitabine-tenofovir AF 50-200-25 MG Tabs tablet Commonly known as:  BIKTARVY Take 1 tablet by mouth daily.   oxybutynin 5 MG tablet Commonly known as:  DITROPAN Take 1 tablet (5 mg total) by mouth 3 (three) times daily as needed for bladder spasms.   oxycodone 5 MG capsule Commonly known as:  OXY-IR Take 1-2 capsules (5-10 mg  total) by mouth every 4 (four) hours as needed. Replaces:  oxyCODONE 5 MG immediate release tablet   senna 8.6 MG Tabs tablet Commonly known as:  SENOKOT Take 2 tablets (17.2 mg total) by mouth at bedtime.   sulfamethoxazole-trimethoprim 400-80 MG tablet Commonly known as:  BACTRIM,SEPTRA Take 1 tablet by mouth daily.   tamsulosin 0.4 MG Caps capsule Commonly known as:  FLOMAX Take 1 capsule (0.4 mg total) by mouth at bedtime.   triamcinolone cream 0.1 % Commonly known as:  KENALOG Apply 1 application topically daily.            Discharge Care Instructions        Start     Ordered   12/06/16 0000  oxycodone (OXY-IR) 5 MG capsule  Every 4 hours PRN     12/06/16 1110   12/06/16 0000  tamsulosin (FLOMAX) 0.4 MG CAPS capsule  Daily at bedtime     12/06/16 1110   12/06/16 0000  oxybutynin (DITROPAN) 5 MG tablet  3 times daily PRN     12/06/16 1110

## 2016-12-06 NOTE — Care Management Note (Signed)
Case Management Note  Patient Details  Name: Kimberly Bishop MRN: [id number] Date of Birth: 1990/06/13  Subjective/Objective: 26 y/o f admitted w/Nephrolithiasis. From home.                   Action/Plan:d/c home.   Expected Discharge Date:  12/06/16               Expected Discharge Plan:  Home/Self Care  In-House Referral:     Discharge planning Services  CM Consult  Post Acute Care Choice:    Choice offered to:     DME Arranged:    DME Agency:     HH Arranged:    HH Agency:     Status of Service:  Completed, signed off  If discussed at [employer] of Stay Meetings, dates discussed:    Additional Comments:  Dessa Phi, RN 12/06/2016, 1:01 PM

## 2016-12-07 LAB — URINE CULTURE

## 2016-12-12 LAB — STONE ANALYSIS
Ca Oxalate,Monohydr.: 5 %
Ca phos cry stone ql IR: 55 %
Magnesium Ammon Phos: 40 %
Stone Weight KSTONE: 741.9 mg

## 2016-12-15 ENCOUNTER — Other Ambulatory Visit: Payer: Self-pay | Admitting: Urology

## 2016-12-19 ENCOUNTER — Other Ambulatory Visit: Payer: Self-pay | Admitting: Pharmacist

## 2016-12-19 DIAGNOSIS — B20 Human immunodeficiency virus [HIV] disease: Secondary | ICD-10-CM

## 2016-12-19 MED ORDER — SULFAMETHOXAZOLE-TRIMETHOPRIM 400-80 MG PO TABS: 1.0000 | ORAL_TABLET | Freq: Every day | ORAL | 5 refills | Status: DC

## 2016-12-19 MED FILL — BIKTARVY 50-200-25 MG TABS: 50-200-25 | 30 days supply | Qty: 30 | Fill #1

## 2016-12-19 MED FILL — SULFAMETHOXAZOLE-TMP SS TAB: 400-80 | 30 days supply | Qty: 30 | Fill #0

## 2017-01-03 ENCOUNTER — Ambulatory Visit (INDEPENDENT_AMBULATORY_CARE_PROVIDER_SITE_OTHER): Payer: Self-pay | Admitting: Internal Medicine

## 2017-01-03 ENCOUNTER — Encounter: Payer: Self-pay | Admitting: Internal Medicine

## 2017-01-03 VITALS — BP 139/83 | HR 81 | Temp 98.3°F | Ht 62.0 in | Wt 113.0 lb

## 2017-01-03 DIAGNOSIS — Z7185 Encounter for immunization safety counseling: Secondary | ICD-10-CM

## 2017-01-03 DIAGNOSIS — B081 Molluscum contagiosum: Secondary | ICD-10-CM

## 2017-01-03 DIAGNOSIS — Z7189 Other specified counseling: Secondary | ICD-10-CM

## 2017-01-03 DIAGNOSIS — Z23 Encounter for immunization: Secondary | ICD-10-CM

## 2017-01-03 DIAGNOSIS — B20 Human immunodeficiency virus [HIV] disease: Secondary | ICD-10-CM

## 2017-01-03 DIAGNOSIS — Z9189 Other specified personal risk factors, not elsewhere classified: Secondary | ICD-10-CM

## 2017-01-03 NOTE — Assessment & Plan Note (Signed)
Compliant.  Labs today and rtc 3 months.

## 2017-01-03 NOTE — Progress Notes (Signed)
   Subjective:    Patient ID: Kimberly Bishop, female    DOB: 10-12-1990, 26 y.o.   MRN: [id number]  HPI Here for follow up of HIV I saw her after a long abscence twice last month.  She did not again have coverage and had been off her medications for months, though I suspect she had not been on them even longer.  She started [place] as a bridge and then to [place] which she continues to take.  Taking Bactrim as well.  Molluscum on face is improving.  No weight loss.  No associated n/v/d.  Last viral load down to 59 copies.  CD4 only 90.     Review of Systems  Constitutional: Negative for fatigue.  Gastrointestinal: Negative for diarrhea.  Skin: Negative for rash.  Neurological: Negative for dizziness.       Objective:   Physical Exam  Constitutional: She appears well-developed and well-nourished. No distress.  Eyes: No scleral icterus.  Cardiovascular: Normal rate, regular rhythm and normal heart sounds.   No murmur heard. Pulmonary/Chest: Effort normal and breath sounds normal. No respiratory distress.  Lymphadenopathy:    She has no cervical adenopathy.   SH: no tobacco       Assessment & Plan:

## 2017-01-03 NOTE — Assessment & Plan Note (Signed)
Improving slowly.  I discussed it should improve over the next 2-6 months with continued viral suppression

## 2017-01-03 NOTE — Assessment & Plan Note (Signed)
Continue on Bactrim prophylaxis

## 2017-01-03 NOTE — Assessment & Plan Note (Signed)
Counseled on the flu shot and given today

## 2017-01-04 LAB — T-HELPER CELL (CD4) - (RCID CLINIC ONLY)
CD4 % Helper T Cell: 11 % — ABNORMAL LOW (ref 33–55)
CD4 T Cell Abs: 120 /uL — ABNORMAL LOW (ref 400–2700)

## 2017-01-05 LAB — HIV-1 RNA QUANT-NO REFLEX-BLD
HIV 1 RNA QUANT: 94 {copies}/mL — AB
HIV-1 RNA Quant, Log: 1.97 Log copies/mL — ABNORMAL HIGH

## 2017-01-05 NOTE — Patient Instructions (Signed)
Kimberly Bishop  01/05/2017   Your procedure is scheduled on: 01-16-17  Report to [facility]  Entrance Take [place]  elevators to 3rd floor to  [place] at 6:45AM.   Call this number if you have problems the morning of surgery [phone]   Remember: ONLY 1 PERSON MAY GO WITH YOU TO SHORT STAY TO GET  READY MORNING OF [place].  Do not eat food or drink liquids :After Midnight.     Take these medicines the morning of surgery with A SIP OF WATER: antiviral medication, Bactrim                                 You may not have any metal on your body including hair pins and              piercings  Do not wear jewelry, make-up, lotions, powders or perfumes, deodorant             Do not wear nail polish.  Do not shave  48 hours prior to surgery.               Do not bring valuables to the hospital. [place].  Contacts, dentures or bridgework may not be worn into surgery.     Patients discharged the day of surgery will not be allowed to drive home.  Name and phone number of your driver:  Special Instructions: N/A              Please read over the following fact sheets you were given: _____________________________________________________________________             [facility] - Preparing for Surgery Before surgery, you can play an important role.  Because skin is not sterile, your skin needs to be as free of germs as possible.  You can reduce the number of germs on your skin by washing with CHG (chlorahexidine gluconate) soap before surgery.  CHG is an antiseptic cleaner which kills germs and bonds with the skin to continue killing germs even after washing. Please DO NOT use if you have an allergy to CHG or antibacterial soaps.  If your skin becomes reddened/irritated stop using the CHG and inform your nurse when you arrive at Short Stay. Do not shave (including legs and underarms) for at  least 48 hours prior to the first CHG shower.  You may shave your face/neck. Please follow these instructions carefully:  1.  Shower with CHG Soap the night before surgery and the  morning of Surgery.  2.  If you choose to wash your hair, wash your hair first as usual with your  normal  shampoo.  3.  After you shampoo, rinse your hair and body thoroughly to remove the  shampoo.                           4.  Use CHG as you would any other liquid soap.  You can apply chg directly  to the skin and wash                       Gently with a scrungie or clean washcloth.  5.  Apply the CHG Soap to your body ONLY FROM THE NECK DOWN.   Do not use on face/ open                           Wound or open sores. Avoid contact with eyes, ears mouth and genitals (private parts).                       Wash face,  Genitals (private parts) with your normal soap.             6.  Wash thoroughly, paying special attention to the area where your surgery  will be performed.  7.  Thoroughly rinse your body with warm water from the neck down.  8.  DO NOT shower/wash with your normal soap after using and rinsing off  the CHG Soap.                9.  Pat yourself dry with a clean towel.            10.  Wear clean pajamas.            11.  Place clean sheets on your bed the night of your first shower and do not  sleep with pets. Day of Surgery : Do not apply any lotions/deodorants the morning of surgery.  Please wear clean clothes to the hospital/surgery center.  FAILURE TO FOLLOW THESE INSTRUCTIONS MAY RESULT IN THE CANCELLATION OF YOUR SURGERY PATIENT SIGNATURE_________________________________  NURSE SIGNATURE__________________________________  ________________________________________________________________________

## 2017-01-05 NOTE — Progress Notes (Signed)
Orders in epic for surgery on 01-16-17 does not include consent order. Please place in epic . PAT appt on 01-10-17. Thank you

## 2017-01-05 NOTE — Progress Notes (Signed)
ekg 12-01-16 epic  cxr 12-01-16 epic

## 2017-01-10 ENCOUNTER — Encounter (HOSPITAL_COMMUNITY)
Admission: RE | Admit: 2017-01-10 | Discharge: 2017-01-10 | Disposition: A | Discharge status: Home / Self Care | Payer: Self-pay | Source: Ambulatory Visit | Attending: Urology | Admitting: Urology

## 2017-01-10 ENCOUNTER — Encounter (HOSPITAL_COMMUNITY): Payer: Self-pay

## 2017-01-10 DIAGNOSIS — Z01812 Encounter for preprocedural laboratory examination: Secondary | ICD-10-CM | POA: Insufficient documentation

## 2017-01-10 LAB — CBC
HEMATOCRIT: 33.8 % — AB (ref 36.0–46.0)
HEMOGLOBIN: 11.6 g/dL — AB (ref 12.0–15.0)
MCH: 29.4 pg (ref 26.0–34.0)
MCHC: 34.3 g/dL (ref 30.0–36.0)
MCV: 85.8 fL (ref 78.0–100.0)
Platelets: 353 10*3/uL (ref 150–400)
RBC: 3.94 MIL/uL (ref 3.87–5.11)
RDW: 15.2 % (ref 11.5–15.5)
WBC: 3.6 10*3/uL — ABNORMAL LOW (ref 4.0–10.5)

## 2017-01-10 LAB — BASIC METABOLIC PANEL
Anion gap: 10 (ref 5–15)
BUN: 11 mg/dL (ref 6–20)
CHLORIDE: 105 mmol/L (ref 101–111)
CO2: 22 mmol/L (ref 22–32)
Calcium: 9.4 mg/dL (ref 8.9–10.3)
Creatinine, Ser: 0.93 mg/dL (ref 0.44–1.00)
GFR calc non Af Amer: >60 mL/min (ref >60)
Glucose, Bld: 87 mg/dL (ref 65–99)
POTASSIUM: 4.2 mmol/L (ref 3.5–5.1)
SODIUM: 137 mmol/L (ref 135–145)

## 2017-01-10 LAB — PREGNANCY, URINE: PREG TEST UR: NEGATIVE

## 2017-01-11 LAB — URINE CULTURE

## 2017-01-11 NOTE — Progress Notes (Signed)
Urine culture result routed via epic to Dr Karsten Ro

## 2017-01-16 ENCOUNTER — Ambulatory Visit (HOSPITAL_COMMUNITY)
Admission: RE | Admit: 2017-01-16 | Discharge: 2017-01-16 | Disposition: A | Discharge status: Home / Self Care | Payer: Self-pay | Source: Ambulatory Visit | Attending: Urology | Admitting: Urology

## 2017-01-16 ENCOUNTER — Ambulatory Visit (HOSPITAL_COMMUNITY): Payer: Self-pay

## 2017-01-16 ENCOUNTER — Other Ambulatory Visit: Payer: Self-pay

## 2017-01-16 ENCOUNTER — Ambulatory Visit (HOSPITAL_COMMUNITY): Payer: Self-pay | Admitting: Anesthesiology

## 2017-01-16 ENCOUNTER — Encounter (HOSPITAL_COMMUNITY): Payer: Self-pay | Admitting: *Deleted

## 2017-01-16 ENCOUNTER — Encounter (HOSPITAL_COMMUNITY)
Admission: RE | Disposition: A | Discharge status: Home / Self Care | Payer: Self-pay | Source: Ambulatory Visit | Attending: Urology

## 2017-01-16 DIAGNOSIS — B20 Human immunodeficiency virus [HIV] disease: Secondary | ICD-10-CM | POA: Insufficient documentation

## 2017-01-16 DIAGNOSIS — N200 Calculus of kidney: Secondary | ICD-10-CM | POA: Insufficient documentation

## 2017-01-16 DIAGNOSIS — Z87442 Personal history of urinary calculi: Secondary | ICD-10-CM | POA: Insufficient documentation

## 2017-01-16 DIAGNOSIS — Z79899 Other long term (current) drug therapy: Secondary | ICD-10-CM | POA: Insufficient documentation

## 2017-01-16 DIAGNOSIS — D649 Anemia, unspecified: Secondary | ICD-10-CM | POA: Insufficient documentation

## 2017-01-16 HISTORY — PX: CYSTOSCOPY/URETEROSCOPY/HOLMIUM LASER/STENT PLACEMENT: SHX6546

## 2017-01-16 SURGERY — CYSTOSCOPY/URETEROSCOPY/HOLMIUM LASER/STENT PLACEMENT
Anesthesia: General | Laterality: Right

## 2017-01-16 MED ORDER — DEXAMETHASONE SODIUM PHOSPHATE 10 MG/ML IJ SOLN
INTRAMUSCULAR | Status: AC
  Filled 2017-01-16: qty 1

## 2017-01-16 MED ORDER — CEFAZOLIN SODIUM-DEXTROSE 2-4 GM/100ML-% IV SOLN
2.0000 g | Freq: Once | INTRAVENOUS | Status: DC
  Administered 2017-01-16: 2 g via INTRAVENOUS
  Filled 2017-01-16: qty 100

## 2017-01-16 MED ORDER — LIDOCAINE 2% (20 MG/ML) 5 ML SYRINGE
INTRAMUSCULAR | Status: DC | PRN
  Administered 2017-01-16: 100 mg via INTRAVENOUS

## 2017-01-16 MED ORDER — PROPOFOL 10 MG/ML IV BOLUS
INTRAVENOUS | Status: DC | PRN
  Administered 2017-01-16: 50 mg via INTRAVENOUS
  Administered 2017-01-16: 150 mg via INTRAVENOUS

## 2017-01-16 MED ORDER — CEFAZOLIN SODIUM-DEXTROSE 2-4 GM/100ML-% IV SOLN: 2.0000 g | INTRAVENOUS | Status: DC

## 2017-01-16 MED ORDER — ONDANSETRON HCL 4 MG/2ML IJ SOLN
INTRAMUSCULAR | Status: AC
  Filled 2017-01-16: qty 2

## 2017-01-16 MED ORDER — OXYCODONE HCL 5 MG PO CAPS: 5.0000 mg | ORAL_CAPSULE | ORAL | 0 refills | Status: DC | PRN

## 2017-01-16 MED ORDER — PROPOFOL 10 MG/ML IV BOLUS
INTRAVENOUS | Status: AC
  Filled 2017-01-16: qty 20

## 2017-01-16 MED ORDER — PROMETHAZINE HCL 25 MG/ML IJ SOLN: 6.2500 mg | INTRAMUSCULAR | Status: DC | PRN

## 2017-01-16 MED ORDER — MEPERIDINE HCL 50 MG/ML IJ SOLN
6.2500 mg | INTRAMUSCULAR | Status: DC | PRN
  Administered 2017-01-16: 6.25 mg via INTRAVENOUS

## 2017-01-16 MED ORDER — 0.9 % SODIUM CHLORIDE (POUR BTL) OPTIME
TOPICAL | Status: DC | PRN
  Administered 2017-01-16: 1000 mL

## 2017-01-16 MED ORDER — LACTATED RINGERS IV SOLN
INTRAVENOUS | Status: DC
  Administered 2017-01-16 (×2): via INTRAVENOUS

## 2017-01-16 MED ORDER — FENTANYL CITRATE (PF) 100 MCG/2ML IJ SOLN: 25.0000 ug | INTRAMUSCULAR | Status: DC | PRN

## 2017-01-16 MED ORDER — ONDANSETRON HCL 4 MG/2ML IJ SOLN
INTRAMUSCULAR | Status: DC | PRN
  Administered 2017-01-16: 4 mg via INTRAVENOUS

## 2017-01-16 MED ORDER — SODIUM CHLORIDE 0.9 % IR SOLN
Status: DC | PRN
  Administered 2017-01-16: 4000 mL

## 2017-01-16 MED ORDER — LIDOCAINE 2% (20 MG/ML) 5 ML SYRINGE
INTRAMUSCULAR | Status: AC
  Filled 2017-01-16: qty 5

## 2017-01-16 MED ORDER — MEPERIDINE HCL 50 MG/ML IJ SOLN
INTRAMUSCULAR | Status: AC
  Filled 2017-01-16: qty 1

## 2017-01-16 MED ORDER — MIDAZOLAM HCL 5 MG/5ML IJ SOLN
INTRAMUSCULAR | Status: DC | PRN
  Administered 2017-01-16: 2 mg via INTRAVENOUS

## 2017-01-16 MED ORDER — FENTANYL CITRATE (PF) 100 MCG/2ML IJ SOLN
INTRAMUSCULAR | Status: AC
  Filled 2017-01-16: qty 2

## 2017-01-16 MED ORDER — DEXAMETHASONE SODIUM PHOSPHATE 10 MG/ML IJ SOLN
INTRAMUSCULAR | Status: DC | PRN
  Administered 2017-01-16: 10 mg via INTRAVENOUS

## 2017-01-16 MED ORDER — HYDROCODONE-ACETAMINOPHEN 5-325 MG PO TABS: 1.0000 | ORAL_TABLET | ORAL | 0 refills | Status: DC | PRN

## 2017-01-16 MED ORDER — MIDAZOLAM HCL 2 MG/2ML IJ SOLN
INTRAMUSCULAR | Status: AC
  Filled 2017-01-16: qty 2

## 2017-01-16 MED ORDER — FENTANYL CITRATE (PF) 100 MCG/2ML IJ SOLN
INTRAMUSCULAR | Status: DC | PRN
  Administered 2017-01-16 (×2): 50 ug via INTRAVENOUS

## 2017-01-16 MED ORDER — IOHEXOL 300 MG/ML  SOLN
INTRAMUSCULAR | Status: DC | PRN
  Administered 2017-01-16: 30 mL

## 2017-01-16 SURGICAL SUPPLY — 25 items
BAG URO CATCHER STRL LF (MISCELLANEOUS) ×3 IMPLANT
BASKET DAKOTA 1.9FR 11X120 (BASKET) IMPLANT
BASKET ZERO TIP 1.9FR (BASKET) IMPLANT
BASKET ZERO TIP NITINOL 2.4FR (BASKET) ×3 IMPLANT
CATH INTERMIT  6FR 70CM (CATHETERS) ×3 IMPLANT
CATH URET DUAL LUMEN 6-10FR 50 (CATHETERS) ×3 IMPLANT
CLOTH BEACON ORANGE TIMEOUT ST (SAFETY) ×3 IMPLANT
COVER FOOTSWITCH UNIV (MISCELLANEOUS) IMPLANT
COVER SURGICAL LIGHT HANDLE (MISCELLANEOUS) ×3 IMPLANT
GLOVE BIOGEL M 8.0 STRL (GLOVE) ×3 IMPLANT
GOWN STRL REUS W/TWL XL LVL3 (GOWN DISPOSABLE) ×3 IMPLANT
GUIDEWIRE ANG ZIPWIRE 038X150 (WIRE) ×3 IMPLANT
GUIDEWIRE STR DUAL SENSOR (WIRE) ×6 IMPLANT
IV NS 1000ML (IV SOLUTION)
IV NS 1000ML BAXH (IV SOLUTION) IMPLANT
KIT BALLN UROMAX 15FX4 (MISCELLANEOUS) ×1 IMPLANT
KIT BALLN UROMAX 26 75X4 (MISCELLANEOUS) ×2
MANIFOLD NEPTUNE II (INSTRUMENTS) ×3 IMPLANT
PACK CYSTO (CUSTOM PROCEDURE TRAY) ×3 IMPLANT
SHEATH ACCESS URETERAL 24CM (SHEATH) IMPLANT
SHEATH ACCESS URETERAL 38CM (SHEATH) IMPLANT
SHEATH ACCESS URETERAL 54CM (SHEATH) IMPLANT
SHEATH URETERAL 12FRX35CM (MISCELLANEOUS) ×3 IMPLANT
TUBING CONNECTING 10 (TUBING) ×2 IMPLANT
TUBING CONNECTING 10' (TUBING) ×1

## 2017-01-16 NOTE — Anesthesia Procedure Notes (Signed)
Procedure Name: LMA Insertion Date/Time: 01/16/2017 8:33 AM Performed by: Lind Covert, CRNA Pre-anesthesia Checklist: Patient identified, Emergency Drugs available, Suction available and Patient being monitored Patient Re-evaluated:Patient Re-evaluated prior to induction Oxygen Delivery Method: Circle system utilized Preoxygenation: Pre-oxygenation with 100% oxygen Induction Type: IV induction Ventilation: Mask ventilation without difficulty LMA Size: 3.0 Number of attempts: 1

## 2017-01-16 NOTE — Discharge Instructions (Signed)
Cystoscopy, Care After Refer to this sheet in the next few weeks. These instructions provide you with information about caring for yourself after your procedure. Your health care provider may also give you more specific instructions. Your treatment has been planned according to current medical practices, but problems sometimes occur. Call your health care provider if you have any problems or questions after your procedure. What can I expect after the procedure? After the procedure, it is common to have:  Mild pain when you urinate. Pain should stop within a few minutes after you urinate. This may last for up to 1 week.  A small amount of blood in your urine for several days.  Feeling like you need to urinate but producing only a small amount of urine.  Follow these instructions at home:  Medicines  Take over-the-counter and prescription medicines only as told by your health care provider.  If you were prescribed an antibiotic medicine, take it as told by your health care provider. Do not stop taking the antibiotic even if you start to feel better. General instructions   Return to your normal activities as told by your health care provider. Ask your health care provider what activities are safe for you.  Do not drive for 24 hours if you received a sedative.  Watch for any blood in your urine. If the amount of blood in your urine increases, call your health care provider.  Follow instructions from your health care provider about eating or drinking restrictions.  If a tissue sample was removed for testing (biopsy) during your procedure, it is your responsibility to get your test results. Ask your health care provider or the department performing the test when your results will be ready.  Drink enough fluid to keep your urine clear or pale yellow.  Keep all follow-up visits as told by your health care provider. This is important. Contact a health care provider if:  You have pain that  gets worse or does not get better with medicine, especially pain when you urinate.  You have difficulty urinating. Get help right away if:  You have more blood in your urine.  You have blood clots in your urine.  You have abdominal pain.  You have a fever or chills.  You are unable to urinate. This information is not intended to replace advice given to you by your health care provider. Make sure you discuss any questions you have with your health care provider. Document Released: 09/17/2004 Document Revised: 08/06/2015 Document Reviewed: 01/15/2015 Elsevier Interactive Patient Education  2017 [place] Anesthesia, Adult, Care After These instructions provide you with information about caring for yourself after your procedure. Your health care provider may also give you more specific instructions. Your treatment has been planned according to current medical practices, but problems sometimes occur. Call your health care provider if you have any problems or questions after your procedure. What can I expect after the procedure? After the procedure, it is common to have:  Vomiting.  A sore throat.  Mental slowness.  It is common to feel:  Nauseous.  Cold or shivery.  Sleepy.  Tired.  Sore or achy, even in parts of your body where you did not have surgery.  Follow these instructions at home: For at least 24 hours after the procedure:  Do not: ? Participate in activities where you could fall or become injured. ? Drive. ? Use heavy machinery. ? Drink alcohol. ? Take sleeping pills or medicines  that cause drowsiness. ? Make important decisions or sign legal documents. ? Take care of children on your own.  Rest. Eating and drinking  If you vomit, drink water, juice, or soup when you can drink without vomiting.  Drink enough fluid to keep your urine clear or pale yellow.  Make sure you have little or no nausea before eating solid foods.  Follow  the diet recommended by your health care provider. General instructions  Have a responsible adult stay with you until you are awake and alert.  Return to your normal activities as told by your health care provider. Ask your health care provider what activities are safe for you.  Take over-the-counter and prescription medicines only as told by your health care provider.  If you smoke, do not smoke without supervision.  Keep all follow-up visits as told by your health care provider. This is important. Contact a health care provider if:  You continue to have nausea or vomiting at home, and medicines are not helpful.  You cannot drink fluids or start eating again.  You cannot urinate after 8-12 hours.  You develop a skin rash.  You have fever.  You have increasing redness at the site of your procedure. Get help right away if:  You have difficulty breathing.  You have chest pain.  You have unexpected bleeding.  You feel that you are having a life-threatening or urgent problem. This information is not intended to replace advice given to you by your health care provider. Make sure you discuss any questions you have with your health care provider. Document Released: 06/06/2000 Document Revised: 08/03/2015 Document Reviewed: 02/12/2015 Elsevier Interactive Patient Education  2018 [employer].  Per Dr. Candis Schatz OP note, pt should follow up in 6 weeks in office.

## 2017-01-16 NOTE — Op Note (Signed)
Preoperative Diagnosis: Right nephrolithiasis  Postoperative Diagnosis:  Same  Procedure(s) Performed:   - Cystourethroscopy - Right ureteroscopic stone extraction   - Right retrograde pyelogram with interpretation - Right ureteral stent removal  - Dilation of stenosed renal infundibulum  - Intraoperative fluoroscopy with interpretation <1hr.   Teaching Surgeon:  Karsten Ro, MD  Resident Surgeon:  Alla Feeling, MD, MD  Assistant(s):  None  Anesthesia:  General  Fluids:  See anesthesia record  Estimated blood loss:  0cc  Specimens:  Stone given to patient  Drains: None  Complications:  None  Indications: 26 y.o. patient with a history of right nephrolithiasis s/p R PCNL with residual stone. Presents for R USE. Risks & benefits of the procedure discussed with the patient, who wishes to proceed.  Findings:   - Indwelling stent with mild encrustation.  - Interpolar infundibulum making navigation into calyx of interest difficult  - Interpolar infundibulum balloon dilated allowed entry into calyx and uncomplicated basket extraction of stone  - No evidence of ureteral trauma   Radiologic Interpretation of Retrograde Pyelogram: Right retrograde pyelogram demonstrated filling defect in right interpolar calyx. Post procedureKUB revealed resolution of filling defect, no contrast extravasation, or evidence of hydroureteronephrosis.  Description:  The patient was correctly identified in the preop holding area where written informed consent as well potential risk and complication reviewed. The patient agreed. They were brought back to the operative suite where a preinduction timeout was performed. Once correct information was verified, general anesthesia was induced. They were then gently placed into dorsal lithotomy position with SCDs in place for VTE prophylaxis. They were prepped and draped in the usual sterile fashion and given appropriate preoperative antibiotics. A second timeout was  then performed.   We inserted a 78F rigid cystoscope per urethra with copious lubrication and normal saline irrigation running. This demonstrated findings as described above.    We turned our attention to the right ureteral orifice with a ureteral stent emanating with minimal encrustation. We grasped the distal end of the ureteral stent and brought it to the ureteral orifice with the flexible graspers. We attempted to pass a sensor wire into the stent but it was encrusted. We then entered the bladder and passed a sensor wire along the stent. The stent was removed and the an 6fr dual lumen catheter was used to perform a retrograde with the above noted findings. Next a 2nd sensor wire was placed an a 12/65fr x 36cm ureteral access sheath was easily passed over the sensor wire into the proximal ureter just distal to the UPJ under fluoroscopic guidance.  The inner cannula was removed and was replaced by the flexible ureteroscope and ureteroscopy was performed.   This demonstrated a simple system with no stone visible. An interpolar calyx with a steep take off was noted and also found to be stenosed. Navigation into this calyx was difficult. After prolonged manipulation, a zip wire was passed into the [place]. The neck was gently dilated with a 80fr dual lumen catheter and the balloon dilated with a 4cm ureteral dilator to 16 atmospheres.   Next the ureteroscope was passed into the calyx and an 6mm stone was found. This was grasped with a zerotip. The ureteroscopy, stone and access sheath were then removed in unison. Post procedure KUB showed resolution of filling defect. Given prior stenting and little ureteral manipulation a stent was not placed.  The remaining sensor wire was then removed. Lastly the patients bladder was emptied.     Post Op  Plan:   1. Discharge home when patient meets PACU criteria  2. Follow up in 6  weeks w/ RUS    I was present and assisted in all aspects of the surgery.

## 2017-01-16 NOTE — Transfer of Care (Signed)
Immediate Anesthesia Transfer of Care Note  Patient: Kimberly Bishop  Procedure(s) Performed: CYSTOSCOPY/URETEROSCOPY/ STONE EXTRACTION/STENT removal (Right )  Patient Location: PACU  Anesthesia Type:General  Level of Consciousness: sedated  Airway & Oxygen Therapy: Patient Spontanous Breathing and Patient connected to face mask oxygen  Post-op Assessment: Report given to RN and Post -op Vital signs reviewed and stable  Post vital signs: Reviewed and stable  Last Vitals:  Vitals:   01/16/17 0658 01/16/17 1003  BP: (!) 137/100 (P) 97/61  Pulse: 79   Resp: 18 (P) 10  Temp: 37.1 C     Last Pain:  Vitals:   01/16/17 1003  TempSrc:   PainSc: (P) Asleep      Patients Stated Pain Goal: 4 (71/85/50 1586)  Complications: No apparent anesthesia complications

## 2017-01-16 NOTE — Anesthesia Postprocedure Evaluation (Signed)
Anesthesia Post Note  Patient: Kimberly Bishop  Procedure(s) Performed: CYSTOSCOPY/URETEROSCOPY/ STONE EXTRACTION/STENT removal (Right )     Patient location during evaluation: PACU Anesthesia Type: General Level of consciousness: awake, awake and alert and oriented Pain management: pain level controlled Vital Signs Assessment: post-procedure vital signs reviewed and stable Respiratory status: spontaneous breathing, nonlabored ventilation and respiratory function stable Cardiovascular status: stable Postop Assessment: no apparent nausea or vomiting Anesthetic complications: no    Last Vitals:  Vitals:   01/16/17 1110 01/16/17 1206  BP: 117/69 133/76  Pulse: 74 69  Resp: 14 18  Temp: (!) 36.4 C 36.7 C  SpO2: 100% 100%    Last Pain:  Vitals:   01/16/17 1206  TempSrc: Oral  PainSc: 0-No pain                 Catalina Gravel

## 2017-01-16 NOTE — Anesthesia Procedure Notes (Signed)
Procedure Name: LMA Insertion Date/Time: 01/16/2017 8:33 AM Performed by: Lind Covert, CRNA Pre-anesthesia Checklist: Patient identified, Emergency Drugs available, Suction available, Patient being monitored and Timeout performed Patient Re-evaluated:Patient Re-evaluated prior to induction Oxygen Delivery Method: Circle system utilized Preoxygenation: Pre-oxygenation with 100% oxygen Induction Type: IV induction LMA: LMA inserted LMA Size: 3.0 Number of attempts: 1 Placement Confirmation: positive ETCO2 and breath sounds checked- equal and bilateral Tube secured with: Tape Dental Injury: Teeth and Oropharynx as per pre-operative assessment

## 2017-01-16 NOTE — Progress Notes (Signed)
Pt is having shivering and was warmed up with no resolve. Dr. Gifford Shave called and orders given. (See MAR) Pt is alert and calm and has no s/s of distress. VS and orders assessed and will continue to monitor and tx pt according to MD orders. Pt denies additional complaints.

## 2017-01-16 NOTE — Progress Notes (Signed)
Pt and mother Kimberly Bishop verbalized understanding of d/c instructions, no further questions. Explained to call office and/or go to ER if pt has any emergent symptoms (cp, sob, inability to urinate, increased blood in urine, blood clots in urine, fever, rash.) Pt left in stable condition without pain.  Pt notified to follow up in office in 6 weeks.

## 2017-01-16 NOTE — Anesthesia Preprocedure Evaluation (Addendum)
Anesthesia Evaluation  Patient identified by MRN, date of birth, ID band Patient awake    Reviewed: Allergy & Precautions, NPO status , Patient's Chart, lab work & pertinent test results  Airway Mallampati: II  TM Distance: >3 FB Neck ROM: Full    Dental  (+) Teeth Intact, Dental Advisory Given   Pulmonary neg pulmonary ROS,    Pulmonary exam normal breath sounds clear to auscultation       Cardiovascular Exercise Tolerance: Good negative cardio ROS Normal cardiovascular exam Rhythm:Regular Rate:Normal     Neuro/Psych negative neurological ROS  negative psych ROS   GI/Hepatic negative GI ROS, Neg liver ROS,   Endo/Other  negative endocrine ROS  Renal/GU Renal disease (Nephrolithiasis)     Musculoskeletal negative musculoskeletal ROS (+)   Abdominal   Peds  Hematology  (+) Blood dyscrasia, anemia , HIV,   Anesthesia Other Findings Day of surgery medications reviewed with the patient.  Reproductive/Obstetrics negative OB ROS                            Anesthesia Physical Anesthesia Plan  ASA: III  Anesthesia Plan:    Post-op Pain Management:    Induction: Intravenous  PONV Risk Score and Plan: 2 and Dexamethasone, Ondansetron and Midazolam  Airway Management Planned: LMA  Additional Equipment:   Intra-op Plan:   Post-operative Plan: Extubation in OR  Informed Consent: I have reviewed the patients History and Physical, chart, labs and discussed the procedure including the risks, benefits and alternatives for the proposed anesthesia with the patient or authorized representative who has indicated his/her understanding and acceptance.   Dental advisory given  Plan Discussed with: CRNA  Anesthesia Plan Comments: (Risks/benefits of general anesthesia discussed with patient including risk of damage to teeth, lips, gum, and tongue, nausea/vomiting, allergic reactions to  medications, and the possibility of heart attack, stroke and death.  All patient questions answered.  Patient wishes to proceed.)        Anesthesia Quick Evaluation

## 2017-01-16 NOTE — H&P (Signed)
Urology Preoperative H+P Note   Chief Complaint:  Stone  HPI: Kimberly Bishop is a 26 y.o. female with hx of nephrolithiasis who recently underwent a Right PCNL with Dr. Karsten Ro on 12/05/2016. Post procedure CT revealed residual stone in her right kidney. Specifically an 6mm fragment in a right interpolar calyx and smaller fragments in the lower pole.   She presents today for R USE, RPG, cysto and stent.   No changes to her pmh since last visit    Past Medical History: Past Medical History:  Diagnosis Date  . 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 History of kidney stones   . HIV (human immunodeficiency virus infection) ([place])   . Immune deficiency disorder [facility])     Past Surgical History:  Past Surgical History:  Procedure Laterality Date  . APPENDECTOMY  2011  . IR DIL URETER RIGHT  12/05/2016  . IR NEPHROSTOMY PLACEMENT RIGHT  11/07/2016  . IR URETERAL STENT PLACEMENT EXISTING ACCESS RIGHT  12/05/2016    Medication: Current Facility-Administered Medications  Medication Dose Route Frequency Provider Last Rate Last Dose  . ceFAZolin (ANCEF) IVPB 2g/100 mL premix  2 g Intravenous 30 min Pre-Op Narang, Gopal L, MD      . lactated ringers infusion   Intravenous Continuous Catalina Gravel, MD 20 mL/hr at 01/16/17 0727      Allergies: No Known Allergies  Social History: Social History   Tobacco Use  . Smoking status: Never Smoker  . Smokeless tobacco: Never Used  Substance Use Topics  . Alcohol use: No    Comment: occasional  . Drug use: No    Family History Family History  Adopted: Yes  Problem Relation Age of Onset  . Hypertension Mother     Review of Systems 10 systems were reviewed and are negative except as noted specifically in the HPI.  Objective   Vital signs in last 24 hours: BP (!) 137/100   Pulse 79   Temp 98.7 F (37.1 C) (Oral)   Resp 18   Ht 5\' 2"  (1.575 m)   Wt 50.8 kg (112 lb)   LMP 01/05/2017 (Exact Date)    BMI 20.49 kg/m   Physical Exam General: NAD, A&O, resting, appropriate HEENT: [place]/AT, EOMI, MMM Pulmonary: Normal work of breathing Cardiovascular: HDS, adequate peripheral perfusion Abdomen: Soft, NTTP, nondistended, . GU: , no CVA tenderness Extremities: warm and well perfused Neuro: Appropriate, no focal neurological deficits  Most Recent Labs: Lab Results  Component Value Date   WBC 3.6 (L) 01/10/2017   HGB 11.6 (L) 01/10/2017   HCT 33.8 (L) 01/10/2017   PLT 353 01/10/2017    Lab Results  Component Value Date   NA 137 01/10/2017   K 4.2 01/10/2017   CL 105 01/10/2017   CO2 22 01/10/2017   BUN 11 01/10/2017   CREATININE 0.93 01/10/2017   CALCIUM 9.4 01/10/2017    Lab Results  Component Value Date   INR 1.10 12/01/2016   APTT 34 11/07/2016     IMAGING: No results found.  ------  Assessment:  26 y.o. female with right 50mm stone and small fragments in lower pole after recent PCNL here for 2nd look. Risk benefits and alternative explained. Will proceed with R USE, RPG, stent and cysto.    Plan: - To OR today as bove - Ancef OCTOR  - Preop pregnancy test.   Patient was seen, examined,treatment plan was discussed with the resident.  I have directly reviewed the  clinical findings, lab, imaging studies and management of this patient in detail. I have made the necessary changes and/or additions to the above noted documentation, and agree with the documentation, as recorded by the resident.

## 2017-01-17 LAB — URINE CULTURE: Culture: NO GROWTH

## 2017-02-11 ENCOUNTER — Encounter (HOSPITAL_COMMUNITY): Payer: Self-pay

## 2017-02-11 ENCOUNTER — Inpatient Hospital Stay (HOSPITAL_COMMUNITY)
Admission: AD | Admit: 2017-02-11 | Discharge: 2017-02-11 | Disposition: A | Discharge status: Home / Self Care | Payer: Medicaid Other | Source: Ambulatory Visit | Attending: Obstetrics and Gynecology | Admitting: Obstetrics and Gynecology

## 2017-02-11 ENCOUNTER — Other Ambulatory Visit: Payer: Self-pay

## 2017-02-11 DIAGNOSIS — B20 Human immunodeficiency virus [HIV] disease: Secondary | ICD-10-CM | POA: Insufficient documentation

## 2017-02-11 DIAGNOSIS — Z87442 Personal history of urinary calculi: Secondary | ICD-10-CM | POA: Insufficient documentation

## 2017-02-11 DIAGNOSIS — Z79899 Other long term (current) drug therapy: Secondary | ICD-10-CM | POA: Insufficient documentation

## 2017-02-11 DIAGNOSIS — Z9889 Other specified postprocedural states: Secondary | ICD-10-CM | POA: Insufficient documentation

## 2017-02-11 DIAGNOSIS — B081 Molluscum contagiosum: Secondary | ICD-10-CM

## 2017-02-11 DIAGNOSIS — Z79891 Long term (current) use of opiate analgesic: Secondary | ICD-10-CM | POA: Insufficient documentation

## 2017-02-11 LAB — URINALYSIS, ROUTINE W REFLEX MICROSCOPIC
BILIRUBIN URINE: NEGATIVE
Glucose, UA: NEGATIVE mg/dL
HGB URINE DIPSTICK: NEGATIVE
Ketones, ur: NEGATIVE mg/dL
NITRITE: NEGATIVE
PH: 6 (ref 5.0–8.0)
Protein, ur: NEGATIVE mg/dL
SPECIFIC GRAVITY, URINE: 1.016 (ref 1.005–1.030)

## 2017-02-11 LAB — POCT PREGNANCY, URINE: PREG TEST UR: NEGATIVE

## 2017-02-11 LAB — WET PREP, GENITAL
SPERM: NONE SEEN
Trich, Wet Prep: NONE SEEN
Yeast Wet Prep HPF POC: NONE SEEN

## 2017-02-11 MED ORDER — PODOFILOX 0.5 % EX SOLN: Freq: Two times a day (BID) | CUTANEOUS | 3 refills | Status: DC

## 2017-02-11 NOTE — MAU Note (Signed)
Pt presents with c/o rash located in vaginal area that's started 2 days ago.  Denies any itiching @ rash site, or discharge from rash.  Denies vaginal discharge or itching.

## 2017-02-11 NOTE — MAU Provider Note (Signed)
Chief Complaint: Rash   First Provider Initiated Contact with Patient 02/11/17 1537      SUBJECTIVE HPI: Kimberly Bishop is a 26 y.o. G1P0010 who presents to maternity admissions reporting new rash near  Her vagina starting yesterday. The rash is associated with burning and irritation but no pain. She has not tried anything for it. There are no other associated symptoms. She is taking her antiviral medications for HIV as prescribed.  She was treated for molluscum contagiosum  on her face 2-3 months ago and it is is still present but improved after using a topical medication.  She denies vaginal bleeding, vaginal itching/burning, urinary symptoms, h/a, dizziness, n/v, or fever/chills.     HPI  Past Medical History:  Diagnosis Date  . 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 History of kidney stones   . HIV (human immunodeficiency virus infection) ([place])   . Immune deficiency disorder [facility])    Past Surgical History:  Procedure Laterality Date  . APPENDECTOMY  2011  . CYSTOSCOPY W/ URETERAL STENT PLACEMENT Right 11/03/2016   Procedure: CYSTOSCOPY WITH RETROGRADE PYELOGRAM/URETERAL STENT PLACEMENT;  Surgeon: Kathie Rhodes, MD;  Location: [facility];  Service: Urology;  Laterality: Right;  . CYSTOSCOPY/URETEROSCOPY/HOLMIUM LASER/STENT PLACEMENT Right 01/16/2017   Procedure: CYSTOSCOPY/URETEROSCOPY/ STONE EXTRACTION/STENT removal;  Surgeon: Kathie Rhodes, MD;  Location: [facility];  Service: Urology;  Laterality: Right;  . IR DIL URETER RIGHT  12/05/2016  . IR NEPHROSTOMY PLACEMENT RIGHT  11/07/2016  . IR URETERAL STENT PLACEMENT EXISTING ACCESS RIGHT  12/05/2016  . NEPHROLITHOTOMY Right 12/05/2016   Procedure: NEPHROLITHOTOMY PERCUTANEOUS;  Surgeon: Kathie Rhodes, MD;  Location: [facility];  Service: Urology;  Laterality: Right;   Social History   Socioeconomic History  . Marital status: Single    Spouse name: Not on file  . Number of children: Not on file  . Years of  education: Not on file  . Highest education level: Not on file  Social Needs  . Financial resource strain: Not on file  . Food insecurity - worry: Not on file  . Food insecurity - inability: Not on file  . Transportation needs - medical: Not on file  . Transportation needs - non-medical: Not on file  Occupational History  . Not on file  Tobacco Use  . Smoking status: Never Smoker  . Smokeless tobacco: Never Used  Substance and Sexual Activity  . Alcohol use: No    Comment: occasional  . Drug use: No  . Sexual activity: Yes    Partners: Male    Birth control/protection: None  Other Topics Concern  . Not on file  Social History Narrative  . Not on file   No current facility-administered medications on file prior to encounter.    Current Outpatient Medications on File Prior to Encounter  Medication Sig Dispense Refill  . bictegravir-emtricitabine-tenofovir AF (BIKTARVY) 50-200-25 MG TABS tablet Take 1 tablet by mouth daily. 30 tablet 2  . sulfamethoxazole-trimethoprim (BACTRIM,SEPTRA) 400-80 MG tablet Take 1 tablet by mouth daily. 30 tablet 5  . HYDROcodone-acetaminophen (NORCO) 5-325 MG tablet Take 1 tablet every 4 (four) hours as needed by mouth for moderate pain. (Patient not taking: Reported on 02/11/2017) 8 tablet 0  . oxybutynin (DITROPAN) 5 MG tablet Take 1 tablet (5 mg total) by mouth 3 (three) times daily as needed for bladder spasms. (Patient not taking: Reported on 02/11/2017) 30 tablet 11  . oxycodone (OXY-IR) 5 MG capsule Take 1-2 capsules (5-10 mg total) every 4 (four)  hours as needed by mouth. (Patient not taking: Reported on 02/11/2017) 20 capsule 0   No Known Allergies  ROS:  Review of Systems  Constitutional: Negative for chills, fatigue and fever.  Respiratory: Negative for shortness of breath.   Cardiovascular: Negative for chest pain.  Gastrointestinal: Negative for abdominal pain, nausea and vomiting.  Genitourinary: Positive for vaginal pain. Negative for  difficulty urinating, dysuria, flank pain, pelvic pain, vaginal bleeding and vaginal discharge.  Skin: Positive for rash.  Neurological: Negative for dizziness and headaches.  Psychiatric/Behavioral: Negative.      I have reviewed patient's Past Medical Hx, Surgical Hx, Family Hx, Social Hx, medications and allergies.   Physical Exam   Patient Vitals for the past 24 hrs:  BP Temp Temp src Pulse Resp SpO2 Height Weight  02/11/17 1331 134/70 98.7 F (37.1 C) Oral 91 20 100 % '5\' 2"'  (1.575 m) 109 lb 4 oz (49.6 kg)   Constitutional: Well-developed, well-nourished female in no acute distress.  Cardiovascular: normal rate Respiratory: normal effort GI: Abd soft, non-tender. Pos BS x 4 MS: Extremities nontender, no edema, normal ROM Neurologic: Alert and oriented x 4.  GU: Neg CVAT.  PELVIC EXAM: Visual inspection reveals flesh colored dome shaped lesions x 8-10 on left groin/inner thigh area near left labia, there are also 1-2 flat eczematous patches near these lesions, lesions are not painful to touch with cotton swab   LAB RESULTS Results for orders placed or performed during the hospital encounter of 02/11/17 (from the past 24 hour(s))  Urinalysis, Routine w reflex microscopic     Status: Abnormal   Collection Time: 02/11/17  1:34 PM  Result Value Ref Range   Color, Urine YELLOW YELLOW   APPearance HAZY (A) CLEAR   Specific Gravity, Urine 1.016 1.005 - 1.030   pH 6.0 5.0 - 8.0   Glucose, UA NEGATIVE NEGATIVE mg/dL   Hgb urine dipstick NEGATIVE NEGATIVE   Bilirubin Urine NEGATIVE NEGATIVE   Ketones, ur NEGATIVE NEGATIVE mg/dL   Protein, ur NEGATIVE NEGATIVE mg/dL   Nitrite NEGATIVE NEGATIVE   Leukocytes, UA MODERATE (A) NEGATIVE   RBC / HPF 6-30 0 - 5 RBC/hpf   WBC, UA TOO NUMEROUS TO COUNT 0 - 5 WBC/hpf   Bacteria, UA MANY (A) NONE SEEN   Squamous Epithelial / LPF 0-5 (A) NONE SEEN   Mucus PRESENT   Pregnancy, urine POC     Status: None   Collection Time: 02/11/17  2:02  PM  Result Value Ref Range   Preg Test, Ur NEGATIVE NEGATIVE       IMAGING   MAU Management/MDM: Ordered labs and reviewed results.  UA with large leukocytes but pt without abdominal pain or dysuria.  Urine sent for culture.  Rash c/w molluscum contagiousum in genital area.  Will prescribe topical therapy for pt to start, Condylox BID for next 2-3 weeks, then follow up in [facility] office.  Pt with clue cells on wet prep but no reported odor or other criteria met for diagnosis of BV.  Discussed findings with pt in MAU, who states understanding. Encouraged pt to continue to take her prescribed HIV medications and follow up with ID as scheduled.  Pt discharged with strict infection precautions.  ASSESSMENT 1. Sexually transmissible infection due to molluscum contagiosum     PLAN Discharge home  Allergies as of 02/11/2017   No Known Allergies     Medication List    STOP taking these medications   HYDROcodone-acetaminophen 5-325 MG tablet  Commonly known as:  NORCO   oxybutynin 5 MG tablet Commonly known as:  DITROPAN   oxycodone 5 MG capsule Commonly known as:  OXY-IR     TAKE these medications   bictegravir-emtricitabine-tenofovir AF 50-200-25 MG Tabs tablet Commonly known as:  BIKTARVY Take 1 tablet by mouth daily.   podofilox 0.5 % external solution Commonly known as:  CONDYLOX Apply topically 2 (two) times daily.   sulfamethoxazole-trimethoprim 400-80 MG tablet Commonly known as:  BACTRIM,SEPTRA Take 1 tablet by mouth daily.        Fatima Blank Certified Nurse-Midwife 02/11/2017  3:41 PM

## 2017-02-13 LAB — URINE CULTURE: Culture: >=100000 — AB

## 2017-02-13 LAB — GC/CHLAMYDIA PROBE AMP (~~LOC~~) NOT AT ARMC
Chlamydia: NEGATIVE
NEISSERIA GONORRHEA: NEGATIVE

## 2017-02-14 ENCOUNTER — Encounter: Payer: Self-pay | Admitting: Advanced Practice Midwife

## 2017-02-14 ENCOUNTER — Other Ambulatory Visit: Payer: Self-pay | Admitting: Advanced Practice Midwife

## 2017-02-14 LAB — HERPES SIMPLEX VIRUS(HSV) DNA BY PCR
HSV 1 DNA: NEGATIVE
HSV 2 DNA: POSITIVE — AB

## 2017-02-14 MED ORDER — NITROFURANTOIN MONOHYD MACRO 100 MG PO CAPS: 100.0000 mg | ORAL_CAPSULE | Freq: Two times a day (BID) | ORAL | 0 refills | Status: DC

## 2017-02-14 NOTE — Progress Notes (Signed)
UC results + for UTI, no treatment started. Sensitive to macrobid. RX sent to pharmacy on file, and mychart message sent to patient.  Marcille Buffy 9:10 AM 02/14/17

## 2017-03-22 ENCOUNTER — Other Ambulatory Visit: Payer: Self-pay | Admitting: Pharmacist

## 2017-03-22 DIAGNOSIS — B20 Human immunodeficiency virus [HIV] disease: Secondary | ICD-10-CM

## 2017-03-22 MED ORDER — SULFAMETHOXAZOLE-TRIMETHOPRIM 400-80 MG PO TABS: 1.0000 | ORAL_TABLET | Freq: Every day | ORAL | 5 refills | Status: DC

## 2017-03-22 MED ORDER — BICTEGRAVIR-EMTRICITAB-TENOFOV 50-200-25 MG PO TABS: 1.0000 | ORAL_TABLET | Freq: Every day | ORAL | 5 refills | Status: DC

## 2017-03-28 ENCOUNTER — Ambulatory Visit (INDEPENDENT_AMBULATORY_CARE_PROVIDER_SITE_OTHER): Payer: Self-pay | Admitting: Internal Medicine

## 2017-03-28 ENCOUNTER — Encounter: Payer: Self-pay | Admitting: Internal Medicine

## 2017-03-28 ENCOUNTER — Ambulatory Visit: Payer: Self-pay

## 2017-03-28 ENCOUNTER — Other Ambulatory Visit (HOSPITAL_COMMUNITY)
Admission: RE | Admit: 2017-03-28 | Discharge: 2017-03-28 | Disposition: A | Discharge status: Home / Self Care | Payer: No Typology Code available for payment source | Source: Ambulatory Visit | Attending: Internal Medicine | Admitting: Internal Medicine

## 2017-03-28 VITALS — BP 118/73 | HR 82 | Temp 98.3°F | Ht 62.0 in | Wt 117.0 lb

## 2017-03-28 DIAGNOSIS — B20 Human immunodeficiency virus [HIV] disease: Secondary | ICD-10-CM

## 2017-03-28 DIAGNOSIS — Z113 Encounter for screening for infections with a predominantly sexual mode of transmission: Secondary | ICD-10-CM

## 2017-03-28 DIAGNOSIS — B081 Molluscum contagiosum: Secondary | ICD-10-CM

## 2017-03-28 DIAGNOSIS — Z79899 Other long term (current) drug therapy: Secondary | ICD-10-CM

## 2017-03-28 DIAGNOSIS — Z9189 Other specified personal risk factors, not elsewhere classified: Secondary | ICD-10-CM

## 2017-03-28 NOTE — Assessment & Plan Note (Signed)
Will screen 

## 2017-03-28 NOTE — Assessment & Plan Note (Signed)
Will continue with Bactrim

## 2017-03-28 NOTE — Assessment & Plan Note (Signed)
Small improvement still. I anticipate about another 6-12 months for resultion with a good immune system.

## 2017-03-28 NOTE — Progress Notes (Signed)
   Subjective:    Patient ID: Kimberly Bishop, female    DOB: 07/29/1990, 27 y.o.   MRN: [id number]  HPI Here for follow up of HIV I saw her after a long abscence in September and October and has been back on her medication still.  Denies any missed doses.  Her mother is still involvedin her care.  She started [place] as a bridge and then to [place] which she continues to take. Does not recall taking Bactrim.   Molluscum on face is improving a little more.  No weight loss.  No associated n/v/d.  Last viral load down to 94 copies.  CD4 up to 120.   Has gained about 4 lbs since last visit.    Review of Systems  Constitutional: Negative for fatigue.  Gastrointestinal: Negative for diarrhea.  Skin: Negative for rash.  Neurological: Negative for dizziness.       Objective:   Physical Exam  Constitutional: She appears well-developed and well-nourished. No distress.  Eyes: No scleral icterus.  Cardiovascular: Normal rate, regular rhythm and normal heart sounds.  No murmur heard. Pulmonary/Chest: Effort normal and breath sounds normal. No respiratory distress.  Lymphadenopathy:    She has no cervical adenopathy.   SH: no tobacco       Assessment & Plan:

## 2017-03-28 NOTE — Assessment & Plan Note (Signed)
Reports doing well.  Will check labs today and I will include resitance testing since she has had detectable virus still and if ok, rtc 4 months.

## 2017-03-29 LAB — COMPLETE METABOLIC PANEL WITH GFR
AG RATIO: 1.3 (calc) (ref 1.0–2.5)
ALBUMIN MSPROF: 4.6 g/dL (ref 3.6–5.1)
ALT: 26 U/L (ref 6–29)
AST: 24 U/L (ref 10–30)
Alkaline phosphatase (APISO): 88 U/L (ref 33–115)
BILIRUBIN TOTAL: 0.5 mg/dL (ref 0.2–1.2)
BUN: 10 mg/dL (ref 7–25)
CALCIUM: 10 mg/dL (ref 8.6–10.2)
CHLORIDE: 105 mmol/L (ref 98–110)
CO2: 23 mmol/L (ref 20–32)
Creat: 0.97 mg/dL (ref 0.50–1.10)
GFR, EST AFRICAN AMERICAN: 93 mL/min/{1.73_m2} (ref >=60)
GFR, EST NON AFRICAN AMERICAN: 81 mL/min/{1.73_m2} (ref >=60)
GLOBULIN: 3.5 g/dL (ref 1.9–3.7)
Glucose, Bld: 83 mg/dL (ref 65–99)
POTASSIUM: 4 mmol/L (ref 3.5–5.3)
SODIUM: 136 mmol/L (ref 135–146)
TOTAL PROTEIN: 8.1 g/dL (ref 6.1–8.1)

## 2017-03-29 LAB — CBC WITH DIFFERENTIAL/PLATELET
BASOS ABS: 29 {cells}/uL (ref 0–200)
Basophils Relative: 0.6 %
EOS ABS: 230 {cells}/uL (ref 15–500)
Eosinophils Relative: 4.8 %
HEMATOCRIT: 37.6 % (ref 35.0–45.0)
Hemoglobin: 12.6 g/dL (ref 11.7–15.5)
Lymphs Abs: 1114 cells/uL (ref 850–3900)
MCH: 29.7 pg (ref 27.0–33.0)
MCHC: 33.5 g/dL (ref 32.0–36.0)
MCV: 88.7 fL (ref 80.0–100.0)
MONOS PCT: 12.3 %
MPV: 11 fL (ref 7.5–12.5)
NEUTROS ABS: 2837 {cells}/uL (ref 1500–7800)
Neutrophils Relative %: 59.1 %
Platelets: 291 10*3/uL (ref 140–400)
RBC: 4.24 10*6/uL (ref 3.80–5.10)
RDW: 13 % (ref 11.0–15.0)
Total Lymphocyte: 23.2 %
WBC mixed population: 590 cells/uL (ref 200–950)
WBC: 4.8 10*3/uL (ref 3.8–10.8)

## 2017-03-29 LAB — LIPID PANEL
CHOLESTEROL: 177 mg/dL (ref <200)
HDL: 44 mg/dL — AB (ref >50)
LDL CHOLESTEROL (CALC): 119 mg/dL — AB
NON-HDL CHOLESTEROL (CALC): 133 mg/dL — AB (ref <130)
TRIGLYCERIDES: 58 mg/dL (ref <150)
Total CHOL/HDL Ratio: 4 (calc) (ref <5.0)

## 2017-03-29 LAB — T-HELPER CELL (CD4) - (RCID CLINIC ONLY)
CD4 % Helper T Cell: 12 % — ABNORMAL LOW (ref 33–55)
CD4 T Cell Abs: 180 /uL — ABNORMAL LOW (ref 400–2700)

## 2017-03-29 LAB — URINE CYTOLOGY ANCILLARY ONLY
Chlamydia: NEGATIVE
NEISSERIA GONORRHEA: NEGATIVE

## 2017-03-29 LAB — RPR: RPR Ser Ql: NONREACTIVE

## 2017-04-05 ENCOUNTER — Telehealth: Payer: Self-pay | Admitting: *Deleted

## 2017-04-05 DIAGNOSIS — B20 Human immunodeficiency virus [HIV] disease: Secondary | ICD-10-CM

## 2017-04-05 NOTE — Telephone Encounter (Signed)
Per Dr Linus Salmons request called the patient to have her come in and see pharmacy but had to leave a message for her to call the office when she can.

## 2017-04-05 NOTE — Telephone Encounter (Signed)
-----   Message from Thayer Headings, MD sent at 03/30/2017  1:42 PM EST ----- Here viral load came back at her baseline level of 17,500 so she clearly is not taking her medications despite what she told me at the visit.  She needs to come back next week and meet with a pharmacist and reassess.  A genotype is already pending. Thanks Rob

## 2017-04-06 LAB — HIV-1 GENOTYPING (RTI,PI,IN INHBTR): HIV-1 GENOTYPE: DETECTED — AB

## 2017-04-06 LAB — HIV-1 RNA QUANT-NO REFLEX-BLD
HIV 1 RNA QUANT: 17500 {copies}/mL — AB
HIV-1 RNA Quant, Log: 4.24 Log copies/mL — ABNORMAL HIGH

## 2017-04-10 NOTE — Addendum Note (Signed)
Addended by: Laverle Patter on: 04/10/2017 09:04 AM   Modules accepted: Orders

## 2017-04-10 NOTE — Telephone Encounter (Signed)
Will refer to [employer].   Laverle Patter, RN

## 2017-04-26 ENCOUNTER — Ambulatory Visit (INDEPENDENT_AMBULATORY_CARE_PROVIDER_SITE_OTHER): Payer: No Typology Code available for payment source | Admitting: Infectious Diseases

## 2017-04-26 ENCOUNTER — Encounter: Payer: Self-pay | Admitting: Infectious Diseases

## 2017-04-26 ENCOUNTER — Telehealth: Payer: Self-pay | Admitting: *Deleted

## 2017-04-26 VITALS — BP 131/78 | HR 90 | Temp 98.9°F | Ht 62.0 in | Wt 117.0 lb

## 2017-04-26 DIAGNOSIS — Z113 Encounter for screening for infections with a predominantly sexual mode of transmission: Secondary | ICD-10-CM | POA: Diagnosis not present

## 2017-04-26 DIAGNOSIS — L282 Other prurigo: Secondary | ICD-10-CM

## 2017-04-26 DIAGNOSIS — B20 Human immunodeficiency virus [HIV] disease: Secondary | ICD-10-CM | POA: Diagnosis not present

## 2017-04-26 DIAGNOSIS — B081 Molluscum contagiosum: Secondary | ICD-10-CM

## 2017-04-26 MED ORDER — HYDROXYZINE HCL 25 MG PO TABS: 25.0000 mg | ORAL_TABLET | Freq: Three times a day (TID) | ORAL | 1 refills | Status: DC | PRN

## 2017-04-26 NOTE — Telephone Encounter (Signed)
Patient called reporting developing a rash all over her body. She is asking if someone can see her today. Per chart review, patient is recently detectable again and attempts to contact her were unsuccessful. Today she is calling from a new number - demographics updated.  Per [name], offered patient an appointment today at 4. She accepted. Landis Gandy, RN

## 2017-04-26 NOTE — Assessment & Plan Note (Signed)
I will check her VL/CD4 today to assess compliance and see how her immune system is responding. I have asked her to continue the [place] and keep the Bactrim going as well. I reviewed her labs with her from previous encounters and counseled her on adherence and safe sex.

## 2017-04-26 NOTE — Assessment & Plan Note (Signed)
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 I reassured her that this is likely going to go away with more time on her medications as her immune system is starting to rebuild and return to normal function. I encouraged her to continue taking her Biktarvy.   Also possibility of drug rash although she has been on her Biktarvy 1.5 months now so less likely. I will check a beta-hCG to ensure no pregnancy today as she has concern over this. Will also check RPR today although not as likely considering presentation. Palms and soles are spared of rash.   Will send her in some hydroxyzine and I have advised her to keep area well moisturized.

## 2017-04-26 NOTE — Telephone Encounter (Signed)
Happy to try

## 2017-04-26 NOTE — Patient Instructions (Signed)
I am hopeful your rash will go away with more time on your medications. I think this has to do with your immune system getting turned back on.   I will send you in some medication for the itching you can take. If this makes you too sleepy you can try over the counter claritin or zyrtec.   Will check your labs today and have you return to see Dr. Linus Salmons as currently scheduled.

## 2017-04-26 NOTE — Telephone Encounter (Signed)
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  Her geno was wild type, baseline viral load.  Clearly just wasn't taking it at all.....  Again.  Maybe you will have better luck.

## 2017-04-26 NOTE — Progress Notes (Signed)
Name: Kimberly Bishop  DOB: Aug 13, 1990 MRN: [id number] PCP: Patient, No Pcp Per   Chief Complaint  Patient presents with  . Rash     Patient Active Problem List   Diagnosis Date Noted  . Screening examination for venereal disease 03/28/2017  . Encounter for long-term (current) use of high-risk medication 03/28/2017  . 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 Vaccine counseling 12/01/2016  . Molluscum contagiosum 03/22/2016  . At risk for opportunistic infections 03/22/2016  . History of opportunistic infections 03/22/2016  . Pruritic rash 02/26/2015  . HIV disease ([place]) 07/03/2012  . Perinatal HIV exposure 12/16/2011     Subjective:   Kimberly Bishop is a 27 y.o. AA female here today for an acute visit for a rash.   She is a patient of Dr. Henreitta Leber and was recently started on [place] for her HIV infection about 1-2 months ago. She has reported taking this medication everyday and does not think she has had any side effects from it.   She has a red raised itchy rash over her forearms, chest and abdomen today. This is a new problem that started about 4 days ago. Also with patch on her face and small white bumpy rash to her face. She has not taken any medications for her symptoms and is wondering what it is from. She has no fevers, diarrhea, abdominal pain, body aches, cough or sore throat. She has had unprotected sex recently and there is a chance she could be pregnant.   Review of Systems  Constitutional: Negative for chills and fever.  HENT: Negative for tinnitus.   Eyes: Negative for blurred vision and photophobia.  Respiratory: Negative for cough and sputum production.   Cardiovascular: Negative for chest pain.  Gastrointestinal: Negative for diarrhea, nausea and vomiting.  Genitourinary: Negative for dysuria.  Musculoskeletal: Negative for myalgias.  Skin: Positive for itching and rash.  Neurological: Negative for headaches.    Past Medical History:  Diagnosis Date  . AIDS  (acquired immunodeficiency syndrome), CD4 <=200 ([place])   . History of kidney stones   . HIV (human immunodeficiency virus infection) ([place])   . Immune deficiency disorder [facility])     Outpatient Medications Prior to Visit  Medication Sig Dispense Refill  . bictegravir-emtricitabine-tenofovir AF (BIKTARVY) 50-200-25 MG TABS tablet Take 1 tablet by mouth daily. 30 tablet 5  . sulfamethoxazole-trimethoprim (BACTRIM,SEPTRA) 400-80 MG tablet Take 1 tablet by mouth daily. 30 tablet 5  . podofilox (CONDYLOX) 0.5 % external solution Apply topically 2 (two) times daily. (Patient not taking: Reported on 03/28/2017) 3.5 mL 3   No facility-administered medications prior to visit.      No Known Allergies  Social History   Tobacco Use  . Smoking status: Never Smoker  . Smokeless tobacco: Never Used  Substance Use Topics  . Alcohol use: No    Comment: occasional  . Drug use: No    Family History  Adopted: Yes  Problem Relation Age of Onset  . Hypertension Mother     Social History   Substance and Sexual Activity  Sexual Activity No  . Partners: Male  . Birth control/protection: None     Objective:   Vitals:   04/26/17 1614  BP: 131/78  Pulse: 90  Temp: 98.9 F (37.2 C)  TempSrc: Oral  Weight: 117 lb (53.1 kg)  Height: 5\' 2"  (1.575 m)   Body mass index is 21.4 kg/m.  Physical Exam  Constitutional: She is oriented to person, place, and time and well-developed, well-nourished,  and in no distress.  HENT:  Mouth/Throat: Oropharynx is clear and moist. No oral lesions. No dental abscesses. No oropharyngeal exudate.  Small pearly white papules noted to upper lip and right cheek. Left cheek with a hyperpigmented scaled patch that is irregular in shape.   Eyes: Pupils are equal, round, and reactive to light.  Cardiovascular: Normal rate.  Pulmonary/Chest: Effort normal.  Musculoskeletal: Normal range of motion. She exhibits no tenderness.  Lymphadenopathy:    She has no cervical  adenopathy.  Neurological: She is alert and oriented to person, place, and time.  Skin: Skin is warm and dry. Rash noted. Rash is papular.  Rare scattered patches of scaling, hyperpigmented skin to right forearm and right chest wall. Red papular rash more heavily concentrated around chest/upper arms down to her forearms. No drainage or vesicles noted.   Psychiatric: Mood, affect and judgment normal.  Vitals reviewed.   Lab Results Lab Results  Component Value Date   WBC 4.8 03/28/2017   HGB 12.6 03/28/2017   HCT 37.6 03/28/2017   MCV 88.7 03/28/2017   PLT 291 03/28/2017    Lab Results  Component Value Date   CREATININE 0.97 03/28/2017   BUN 10 03/28/2017   NA 136 03/28/2017   K 4.0 03/28/2017   CL 105 03/28/2017   CO2 23 03/28/2017    Lab Results  Component Value Date   ALT 26 03/28/2017   AST 24 03/28/2017   ALKPHOS 97 11/03/2016   BILITOT 0.5 03/28/2017    Lab Results  Component Value Date   CHOL 177 03/28/2017   HDL 44 (L) 03/28/2017   LDLCALC 100 10/27/2015   TRIG 58 03/28/2017   CHOLHDL 4.0 03/28/2017   HIV 1 RNA Quant (copies/mL)  Date Value  03/28/2017 17,500 (H)  01/03/2017 94 (H)  12/01/2016 59 (H)   CD4 T Cell Abs (/uL)  Date Value  03/28/2017 180 (L)  01/03/2017 120 (L)  12/01/2016 90 (L)   Lab Results  Component Value Date   HAV NEG 12/09/2011   Lab Results  Component Value Date   HEPBSAG NEGATIVE 12/09/2011   HEPBSAB NEG 12/09/2011   Lab Results  Component Value Date   HCVAB NEGATIVE 12/09/2011   Lab Results  Component Value Date   CHLAMYDIAWP Negative 03/28/2017   N Negative 03/28/2017   Lab Results  Component Value Date   GCPROBEAPT NEGATIVE 12/23/2013   No results found for: QU[place]GOLD No results found for: RPR   Assessment & Plan:   Problem List Items Addressed This Visit      Musculoskeletal and Integument   Pruritic rash - Primary    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 I  reassured her that this is likely going to go away with more time on her medications as her immune system is starting to rebuild and return to normal function. I encouraged her to continue taking her Biktarvy.   Also possibility of drug rash although she has been on her Biktarvy 1.5 months now so less likely. I will check a beta-hCG to ensure no pregnancy today as she has concern over this. Will also check RPR today although not as likely considering presentation. Palms and soles are spared of rash.   Will send her in some hydroxyzine and I have advised her to keep area well moisturized.         Relevant Orders   B-HCG Quant     Other   Screening examination for venereal  disease   Relevant Orders   RPR   Molluscum contagiosum   HIV disease ([place])    I will check her VL/CD4 today to assess compliance and see how her immune system is responding. I have asked her to continue the [place] and keep the Bactrim going as well. I reviewed her labs with her from previous encounters and counseled her on adherence and safe sex.       Relevant Orders   HIV 1 RNA quant-no reflex-bld   T-helper cell (CD4)- ([facility] clinic only)     She will return as previously scheduled with Dr. Linus Salmons or if this condition does not improve.   Janene Madeira, MSN, NP-C [facility] for Infectious [place] Pager: [phone] Office: [phone]  04/26/17  4:56 PM

## 2017-04-27 LAB — RPR: RPR: NONREACTIVE

## 2017-04-27 LAB — HCG, QUANTITATIVE, PREGNANCY

## 2017-04-28 LAB — HIV-1 RNA QUANT-NO REFLEX-BLD
HIV 1 RNA QUANT: 1130 {copies}/mL — AB
HIV-1 RNA Quant, Log: 3.05 Log copies/mL — ABNORMAL HIGH

## 2017-04-28 LAB — T-HELPER CELL (CD4) - (RCID CLINIC ONLY)
CD4 T CELL HELPER: 12 % — AB (ref 33–55)
CD4 T Cell Abs: 200 /uL — ABNORMAL LOW (ref 400–2700)

## 2017-05-05 ENCOUNTER — Other Ambulatory Visit: Payer: Self-pay

## 2017-05-05 ENCOUNTER — Encounter (HOSPITAL_COMMUNITY): Payer: Self-pay | Admitting: Emergency Medicine

## 2017-05-05 ENCOUNTER — Emergency Department (HOSPITAL_COMMUNITY): Payer: No Typology Code available for payment source

## 2017-05-05 ENCOUNTER — Emergency Department (HOSPITAL_COMMUNITY)
Admission: EM | Admit: 2017-05-05 | Discharge: 2017-05-05 | Disposition: A | Discharge status: Home / Self Care | Payer: No Typology Code available for payment source | Attending: Emergency Medicine | Admitting: Emergency Medicine

## 2017-05-05 DIAGNOSIS — Z21 Asymptomatic human immunodeficiency virus [HIV] infection status: Secondary | ICD-10-CM | POA: Insufficient documentation

## 2017-05-05 DIAGNOSIS — R6889 Other general symptoms and signs: Secondary | ICD-10-CM

## 2017-05-05 DIAGNOSIS — Z79899 Other long term (current) drug therapy: Secondary | ICD-10-CM | POA: Insufficient documentation

## 2017-05-05 DIAGNOSIS — J111 Influenza due to unidentified influenza virus with other respiratory manifestations: Secondary | ICD-10-CM | POA: Insufficient documentation

## 2017-05-05 DIAGNOSIS — R05 Cough: Secondary | ICD-10-CM | POA: Diagnosis present

## 2017-05-05 DIAGNOSIS — B20 Human immunodeficiency virus [HIV] disease: Secondary | ICD-10-CM

## 2017-05-05 MED ORDER — OSELTAMIVIR PHOSPHATE 75 MG PO CAPS
75.0000 mg | ORAL_CAPSULE | Freq: Once | ORAL | Status: AC
  Administered 2017-05-05: 75 mg via ORAL
  Filled 2017-05-05: qty 1

## 2017-05-05 MED ORDER — HYDROCODONE-HOMATROPINE 5-1.5 MG/5ML PO SYRP: 5.0000 mL | ORAL_SOLUTION | Freq: Four times a day (QID) | ORAL | 0 refills | Status: DC | PRN

## 2017-05-05 MED ORDER — PSEUDOEPHEDRINE HCL 60 MG PO TABS
60.0000 mg | ORAL_TABLET | Freq: Once | ORAL | Status: AC
  Administered 2017-05-05: 60 mg via ORAL
  Filled 2017-05-05: qty 1

## 2017-05-05 MED ORDER — IBUPROFEN 400 MG PO TABS
400.0000 mg | ORAL_TABLET | Freq: Once | ORAL | Status: AC
  Administered 2017-05-05: 400 mg via ORAL
  Filled 2017-05-05: qty 1

## 2017-05-05 MED ORDER — OSELTAMIVIR PHOSPHATE 75 MG PO CAPS: 75.0000 mg | ORAL_CAPSULE | Freq: Two times a day (BID) | ORAL | 0 refills | Status: DC

## 2017-05-05 NOTE — ED Triage Notes (Signed)
Pt c/o congestion,cough and headache x 2 days. Nad.

## 2017-05-05 NOTE — ED Provider Notes (Signed)
[facility] EMERGENCY DEPARTMENT Provider Note   CSN: [id number] Arrival date & time: 05/05/17  3762     History   Chief Complaint Chief Complaint  Patient presents with  . Cough  . Headache    HPI Kimberly Bishop is a 27 y.o. female.  The history is provided by the patient.  URI   This is a new problem. The current episode started more than 1 week ago. Associated symptoms include congestion, sore throat and cough. Pertinent negatives include no chest pain, no abdominal pain, no diarrhea, no nausea, no vomiting, no dysuria, no neck pain and no wheezing. Associated symptoms comments: Shortness of breath with being supine. Treatments tried: tylenol, Vicks, OTC cold medications.    Past Medical History:  Diagnosis Date  . AIDS (acquired immunodeficiency syndrome), CD4 <=200 ([place])   . History of kidney stones   . HIV (human immunodeficiency virus infection) ([place])   . Immune deficiency disorder [facility])     Patient Active Problem List   Diagnosis Date Noted  . Screening examination for venereal disease 03/28/2017  . Encounter for long-term (current) use of high-risk medication 03/28/2017  . 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 Vaccine counseling 12/01/2016  . Molluscum contagiosum 03/22/2016  . At risk for opportunistic infections 03/22/2016  . History of opportunistic infections 03/22/2016  . Pruritic rash 02/26/2015  . HIV disease ([place]) 07/03/2012  . Perinatal HIV exposure 12/16/2011    Past Surgical History:  Procedure Laterality Date  . APPENDECTOMY  2011  . CYSTOSCOPY W/ URETERAL STENT PLACEMENT Right 11/03/2016   Procedure: CYSTOSCOPY WITH RETROGRADE PYELOGRAM/URETERAL STENT PLACEMENT;  Surgeon: Kathie Rhodes, MD;  Location: [facility];  Service: Urology;  Laterality: Right;  . CYSTOSCOPY/URETEROSCOPY/HOLMIUM LASER/STENT PLACEMENT Right 01/16/2017   Procedure: CYSTOSCOPY/URETEROSCOPY/ STONE EXTRACTION/STENT removal;  Surgeon: Kathie Rhodes, MD;  Location: [facility];  Service:  Urology;  Laterality: Right;  . IR DIL URETER RIGHT  12/05/2016  . IR NEPHROSTOMY PLACEMENT RIGHT  11/07/2016  . IR URETERAL STENT PLACEMENT EXISTING ACCESS RIGHT  12/05/2016  . NEPHROLITHOTOMY Right 12/05/2016   Procedure: NEPHROLITHOTOMY PERCUTANEOUS;  Surgeon: Kathie Rhodes, MD;  Location: [facility];  Service: Urology;  Laterality: Right;    OB History    Gravida Para Term Preterm AB Living   1 0 0 0 1 0   SAB TAB Ectopic Multiple Live Births   1 0 0 0         Home Medications    Prior to Admission medications   Medication Sig Start Date End Date Taking? Authorizing Provider  bictegravir-emtricitabine-tenofovir AF (BIKTARVY) 50-200-25 MG TABS tablet Take 1 tablet by mouth daily. 03/22/17   Kuppelweiser, Cassie L, RPH-CPP  hydrOXYzine (ATARAX/VISTARIL) 25 MG tablet Take 1 tablet (25 mg total) by mouth every 8 (eight) hours as needed for itching. 04/26/17   Lanagan Callas, NP  podofilox (CONDYLOX) 0.5 % external solution Apply topically 2 (two) times daily. Patient not taking: Reported on 03/28/2017 02/11/17   Elvera Maria, CNM  sulfamethoxazole-trimethoprim (BACTRIM,SEPTRA) 400-80 MG tablet Take 1 tablet by mouth daily. 03/22/17   Kuppelweiser, Gillian Shields, RPH-CPP    Family History Family History  Adopted: Yes  Problem Relation Age of Onset  . Hypertension Mother     Social History Social History   Tobacco Use  . Smoking status: Never Smoker  . Smokeless tobacco: Never Used  Substance Use Topics  . Alcohol use: No    Comment: occasional  . Drug use: No     Allergies  Patient has no known allergies.   Review of Systems Review of Systems  Constitutional: Negative for activity change.       All ROS Neg except as noted in HPI  HENT: Positive for congestion and sore throat. Negative for nosebleeds.   Eyes: Negative for photophobia and discharge.  Respiratory: Positive for cough. Negative for shortness of breath and wheezing.   Cardiovascular: Negative for chest  pain and palpitations.  Gastrointestinal: Negative for abdominal pain, blood in stool, diarrhea, nausea and vomiting.  Genitourinary: Negative for dysuria, frequency and hematuria.  Musculoskeletal: Negative for arthralgias, back pain and neck pain.  Skin: Negative.   Neurological: Negative for dizziness, seizures and speech difficulty.  Psychiatric/Behavioral: Negative for confusion and hallucinations.     Physical Exam Updated Vital Signs BP 127/89   Pulse (!) 107   Temp 100.3 F (37.9 C) (Oral)   Resp (!) 22   LMP 04/30/2017   SpO2 98%   Physical Exam  Constitutional: She is oriented to person, place, and time. She appears well-developed and well-nourished.  Non-toxic appearance.  HENT:  Head: Normocephalic.  Right Ear: Tympanic membrane and external ear normal.  Left Ear: Tympanic membrane and external ear normal.  Nasal congestion. No pain to percussion of the sinuses  Eyes: EOM and lids are normal. Pupils are equal, round, and reactive to light.  Neck: Normal range of motion. Neck supple. Carotid bruit is not present.  Cardiovascular: Regular rhythm, normal heart sounds, intact distal pulses and normal pulses. Tachycardia present.  Pulmonary/Chest: Breath sounds normal. No respiratory distress.  Abdominal: Soft. Bowel sounds are normal. There is no tenderness. There is no guarding.  Musculoskeletal: Normal range of motion.  Lymphadenopathy:       Head (right side): No submandibular adenopathy present.       Head (left side): No submandibular adenopathy present.    She has no cervical adenopathy.  Neurological: She is alert and oriented to person, place, and time. She has normal strength. No cranial nerve deficit or sensory deficit.  Skin: Skin is warm and dry.  Papular flesh tone rash about the mouth and face. Pt reports similar rash on thigh  Psychiatric: She has a normal mood and affect. Her speech is normal.  Nursing note and vitals reviewed.    ED Treatments /  Results  Labs (all labs ordered are listed, but only abnormal results are displayed) Labs Reviewed - No data to display  EKG  EKG Interpretation None       Radiology No results found.  Procedures Procedures (including critical care time)  Medications Ordered in ED Medications - No data to display   Initial Impression / Assessment and Plan / ED Course  I have reviewed the triage vital signs and the nursing notes.  Pertinent labs & imaging results that were available during my care of the patient were reviewed by me and considered in my medical decision making (see chart for details).       Final Clinical Impressions(s) / ED Diagnoses MDM  Vital signs reviewed. Previous lab work reviewed. Latest CD4 is low at 200.  Chest x-ray is negative for acute changes.  It is ambulatory in the hall without problem.  I suspect the patient has flulike illness.  Patient will be treated with Hycodan for cough, ibuprofen every 6 hours or Tylenol every 4 hours for fever or aching, and Tamiflu.  Of asked the patient to see her physicians at infectious disease for recheck soon.  I have also  asked her to call the [facility] for assistance with establishing a primary care physician.  Patient is in agreement with this plan.   Final diagnoses:  Flu-like symptoms  History of HIV infection [facility])    ED Discharge Orders        Ordered    oseltamivir (TAMIFLU) 75 MG capsule  Every 12 hours     05/05/17 1150    HYDROcodone-homatropine (HYCODAN) 5-1.5 MG/5ML syrup  Every 6 hours PRN     05/05/17 1150       Lily Kocher, PA-C 05/05/17 1155    Davonna Belling, MD 05/05/17 1558

## 2017-05-05 NOTE — ED Notes (Signed)
To rad 

## 2017-05-05 NOTE — Discharge Instructions (Signed)
Your chest x-ray is negative for acute problems.  Your oxygen level is 100% on room air.  Your examination suggest flulike illness.  Please wash hands frequently, and encourage everyone in your home to wash hands frequently to prevent spread of this infection.  Please increase your water, Gatorade, juices, etc..  Hydration is extremely important.  Please see your infectious control physician for follow-up.  Please see the physicians at the [facility] clinic for assistance in obtaining a primary care physician.  Use Tamiflu 2 times daily.  Use Tylenol every 4 hours for fever or aching, or ibuprofen every 6 hours.  Please use Hycodan for cough.This medication may cause drowsiness. Please do not drink, drive, or participate in activity that requires concentration while taking this medication.

## 2017-07-25 ENCOUNTER — Ambulatory Visit: Payer: Medicaid Other | Admitting: Internal Medicine

## 2017-09-08 ENCOUNTER — Ambulatory Visit: Payer: No Typology Code available for payment source | Admitting: Internal Medicine

## 2017-09-19 ENCOUNTER — Ambulatory Visit: Payer: No Typology Code available for payment source | Admitting: Internal Medicine

## 2018-01-30 ENCOUNTER — Ambulatory Visit: Payer: Medicaid Other | Admitting: Internal Medicine

## 2018-04-27 ENCOUNTER — Telehealth: Payer: Self-pay | Admitting: *Deleted

## 2018-04-27 NOTE — Telephone Encounter (Signed)
I would probably wait until we can get more information and see exactly how long she's been off or how she has been taking it.  Will defer to Dr. Linus Salmons though.

## 2018-04-27 NOTE — Telephone Encounter (Signed)
Kimberly Bishop called, wanting to reestablish care.  She has been off medication for "a while."  She has a new job, with insurance, works Monday - Thursday, 8-6.  She has Fridays off.  RN scheduled patient to see [facility] Friday 2/21.  Please advise if appropriate to send in new prescription of Biktarvy for her to start taking before her appointment 2/21. Landis Gandy, RN

## 2018-04-30 NOTE — Telephone Encounter (Signed)
I would wait until she shows up. thanks

## 2018-05-04 ENCOUNTER — Ambulatory Visit: Payer: Medicaid Other | Admitting: Pharmacist

## 2018-05-09 ENCOUNTER — Telehealth: Payer: Self-pay

## 2018-05-09 NOTE — Telephone Encounter (Signed)
Attempted to call patient regarding message left on 2/25. Unable reach patient at this time;left voicemail with patient to call office back if she still has any questions for our office. [place]

## 2018-05-09 NOTE — Telephone Encounter (Signed)
-----   Message from Lynnell Chad sent at 05/08/2018  1:37 PM EST ----- Regarding: Medication Refill Patient called to reschedule an appointment and had a question if she would be able to get a medication refill before then- she recently spoke with a nurse and told her to wait until her next appointment which was originally been on 2/21 unfortunately she could not make it due to the snow.

## 2018-05-15 ENCOUNTER — Encounter (HOSPITAL_COMMUNITY): Payer: Self-pay | Admitting: *Deleted

## 2018-05-15 ENCOUNTER — Emergency Department (HOSPITAL_COMMUNITY)
Admission: EM | Admit: 2018-05-15 | Discharge: 2018-05-15 | Disposition: A | Discharge status: Home / Self Care | Payer: BLUE CROSS/BLUE SHIELD | Attending: Emergency Medicine | Admitting: Emergency Medicine

## 2018-05-15 ENCOUNTER — Other Ambulatory Visit: Payer: Self-pay

## 2018-05-15 ENCOUNTER — Emergency Department (HOSPITAL_COMMUNITY): Payer: BLUE CROSS/BLUE SHIELD

## 2018-05-15 DIAGNOSIS — B20 Human immunodeficiency virus [HIV] disease: Secondary | ICD-10-CM | POA: Diagnosis not present

## 2018-05-15 DIAGNOSIS — R05 Cough: Secondary | ICD-10-CM | POA: Diagnosis not present

## 2018-05-15 DIAGNOSIS — Z79899 Other long term (current) drug therapy: Secondary | ICD-10-CM | POA: Insufficient documentation

## 2018-05-15 DIAGNOSIS — B9789 Other viral agents as the cause of diseases classified elsewhere: Secondary | ICD-10-CM

## 2018-05-15 DIAGNOSIS — J069 Acute upper respiratory infection, unspecified: Secondary | ICD-10-CM | POA: Diagnosis not present

## 2018-05-15 LAB — INFLUENZA PANEL BY PCR (TYPE A & B)
INFLAPCR: NEGATIVE
Influenza B By PCR: NEGATIVE

## 2018-05-15 MED ORDER — HYDROCODONE-HOMATROPINE 5-1.5 MG/5ML PO SYRP: 5.0000 mL | ORAL_SOLUTION | Freq: Four times a day (QID) | ORAL | 0 refills | Status: DC | PRN

## 2018-05-15 MED ORDER — IBUPROFEN 400 MG PO TABS
400.0000 mg | ORAL_TABLET | Freq: Once | ORAL | Status: AC
  Administered 2018-05-15: 400 mg via ORAL
  Filled 2018-05-15: qty 1

## 2018-05-15 MED ORDER — ACETAMINOPHEN 500 MG PO TABS
1000.0000 mg | ORAL_TABLET | Freq: Once | ORAL | Status: AC
  Administered 2018-05-15: 1000 mg via ORAL
  Filled 2018-05-15: qty 2

## 2018-05-15 MED ORDER — LORATADINE-PSEUDOEPHEDRINE ER 5-120 MG PO TB12: 1.0000 | ORAL_TABLET | Freq: Two times a day (BID) | ORAL | 0 refills | Status: DC

## 2018-05-15 NOTE — ED Provider Notes (Signed)
[facility] EMERGENCY DEPARTMENT Provider Note   CSN: [id number] Arrival date & time: 05/15/18  1545    History   Chief Complaint Chief Complaint  Patient presents with  . URI    HPI Kimberly Bishop is a 28 y.o. female.     The history is provided by the patient.  URI  Presenting symptoms: congestion, cough and ear pain   Presenting symptoms: no fever   Severity:  Moderate Onset quality:  Gradual Duration:  5 days Timing:  Intermittent Progression:  Worsening Chronicity:  New Relieved by:  Nothing Worsened by:  Nothing Ineffective treatments:  OTC medications Associated symptoms: headaches and sneezing   Associated symptoms: no arthralgias, no neck pain and no wheezing   Risk factors: no recent travel and no sick contacts     Past Medical History:  Diagnosis Date  . AIDS (acquired immunodeficiency syndrome), CD4 <=200 ([place])   . History of kidney stones   . HIV (human immunodeficiency virus infection) ([place])   . Immune deficiency disorder [facility])     Patient Active Problem List   Diagnosis Date Noted  . Screening examination for venereal disease 03/28/2017  . Encounter for long-term (current) use of high-risk medication 03/28/2017  . 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 Vaccine counseling 12/01/2016  . Molluscum contagiosum 03/22/2016  . At risk for opportunistic infections 03/22/2016  . History of opportunistic infections 03/22/2016  . Pruritic rash 02/26/2015  . HIV disease ([place]) 07/03/2012  . Perinatal HIV exposure 12/16/2011    Past Surgical History:  Procedure Laterality Date  . APPENDECTOMY  2011  . CYSTOSCOPY W/ URETERAL STENT PLACEMENT Right 11/03/2016   Procedure: CYSTOSCOPY WITH RETROGRADE PYELOGRAM/URETERAL STENT PLACEMENT;  Surgeon: Kathie Rhodes, MD;  [facility];  Service: Urology;  Laterality: Right;  . CYSTOSCOPY/URETEROSCOPY/HOLMIUM LASER/STENT PLACEMENT Right 01/16/2017   Procedure: CYSTOSCOPY/URETEROSCOPY/ STONE EXTRACTION/STENT removal;   Surgeon: Kathie Rhodes, MD;  [facility];  Service: Urology;  Laterality: Right;  . IR DIL URETER RIGHT  12/05/2016  . IR NEPHROSTOMY PLACEMENT RIGHT  11/07/2016  . IR URETERAL STENT PLACEMENT EXISTING ACCESS RIGHT  12/05/2016  . NEPHROLITHOTOMY Right 12/05/2016   Procedure: NEPHROLITHOTOMY PERCUTANEOUS;  Surgeon: Kathie Rhodes, MD;  [facility];  Service: Urology;  Laterality: Right;     OB History    Gravida  1   Para  0   Term  0   Preterm  0   AB  1   Living  0     SAB  1   TAB  0   Ectopic  0   Multiple  0   Live Births               Home Medications    Prior to Admission medications   Medication Sig Start Date End Date Taking? Authorizing Provider  bictegravir-emtricitabine-tenofovir AF (BIKTARVY) 50-200-25 MG TABS tablet Take 1 tablet by mouth daily. 03/22/17   Kuppelweiser, Cassie L, RPH-CPP  HYDROcodone-homatropine (HYCODAN) 5-1.5 MG/5ML syrup Take 5 mLs by mouth every 6 (six) hours as needed. 05/05/17   Lily Kocher, PA-C  hydrOXYzine (ATARAX/VISTARIL) 25 MG tablet Take 1 tablet (25 mg total) by mouth every 8 (eight) hours as needed for itching. 04/26/17   [place] Callas, NP  oseltamivir (TAMIFLU) 75 MG capsule Take 1 capsule (75 mg total) by mouth every 12 (twelve) hours. 05/05/17   Lily Kocher, PA-C  podofilox (CONDYLOX) 0.5 % external solution Apply topically 2 (two) times daily. Patient not taking: Reported on 03/28/2017 02/11/17  Leftwich-Kirby, Kathie Dike, CNM  sulfamethoxazole-trimethoprim (BACTRIM,SEPTRA) 400-80 MG tablet Take 1 tablet by mouth daily. 03/22/17   Kuppelweiser, Gillian Shields, RPH-CPP    Family History Family History  Adopted: Yes  Problem Relation Age of Onset  . Hypertension Mother     Social History Social History   Tobacco Use  . Smoking status: Never Smoker  . Smokeless tobacco: Never Used  Substance Use Topics  . Alcohol use: No    Comment: occasional  . Drug use: No     Allergies   Patient has no known  allergies.   Review of Systems Review of Systems  Constitutional: Negative for activity change and fever.       All ROS Neg except as noted in HPI  HENT: Positive for congestion, ear pain and sneezing. Negative for nosebleeds.   Eyes: Negative for photophobia and discharge.  Respiratory: Positive for cough. Negative for shortness of breath and wheezing.   Cardiovascular: Negative for chest pain and palpitations.  Gastrointestinal: Negative for abdominal pain and blood in stool.  Genitourinary: Negative for dysuria, frequency and hematuria.  Musculoskeletal: Negative for arthralgias, back pain and neck pain.  Skin: Negative.   Neurological: Positive for headaches. Negative for dizziness, seizures and speech difficulty.  Psychiatric/Behavioral: Negative for confusion and hallucinations.     Physical Exam Updated Vital Signs BP (!) 152/105 (BP Location: Left Arm)   Pulse (!) 102   Temp 98 F (36.7 C) (Oral)   Resp 12   Ht 5\' 2"  (1.575 m)   Wt 49.9 kg   LMP 04/15/2018   SpO2 99%   BMI 20.12 kg/m   Physical Exam Vitals signs and nursing note reviewed.  Constitutional:      Appearance: She is well-developed. She is not toxic-appearing.  HENT:     Head: Normocephalic.     Comments: There are no pre-or postauricular nodes appreciated on the right or the left.  The external auditory canals are clear.  There are no foreign bodies appreciated.  No swelling noted.  The right tympanic membrane shows fluid behind the drum and minimal bulge.  No significant increased redness, and no drainage appreciated.  The mastoids are not involved.    Right Ear: External ear normal.     Left Ear: Tympanic membrane and external ear normal.     Nose: Congestion present.  Eyes:     General: Lids are normal.     Pupils: Pupils are equal, round, and reactive to light.  Neck:     Musculoskeletal: Normal range of motion and neck supple.     Vascular: No carotid bruit.  Cardiovascular:     Rate and  Rhythm: Normal rate and regular rhythm.     Pulses: Normal pulses.     Heart sounds: Normal heart sounds.  Pulmonary:     Effort: No respiratory distress.     Breath sounds: Normal breath sounds.     Comments: Patient speaks in complete sentences without problem. Abdominal:     General: Bowel sounds are normal.     Palpations: Abdomen is soft.     Tenderness: There is no abdominal tenderness. There is no guarding.  Musculoskeletal: Normal range of motion.  Lymphadenopathy:     Head:     Right side of head: No submandibular adenopathy.     Left side of head: No submandibular adenopathy.     Cervical: No cervical adenopathy.  Skin:    General: Skin is warm and dry.  Neurological:  Mental Status: She is alert and oriented to person, place, and time.     Cranial Nerves: No cranial nerve deficit.     Sensory: No sensory deficit.  Psychiatric:        Speech: Speech normal.      ED Treatments / Results  Labs (all labs ordered are listed, but only abnormal results are displayed) Labs Reviewed - No data to display  EKG None  Radiology No results found.  Procedures Procedures (including critical care time)  Medications Ordered in ED Medications - No data to display   Initial Impression / Assessment and Plan / ED Course  I have reviewed the triage vital signs and the nursing notes.  Pertinent labs & imaging results that were available during my care of the patient were reviewed by me and considered in my medical decision making (see chart for details).          Final Clinical Impressions(s) / ED Diagnoses MDM  Vital signs reviewed.  Pulse oximetry is 99% on room air. Patient speaks in complete sentences without problem.  She has some fluid behind the right eardrum.  Chest x-ray is negative.  Influenza test is negative.  I have asked the patient to wash hands frequently, I have provided a mask for her.  The patient is to use Tylenol every 4 hours or ibuprofen  every 6 hours for fever, and/or aching.  The patient will use Claritin-D for nasal congestion, she will use Hycodan for cough.  The patient is to follow-up with her primary physician to ensure complete resolution of this issue.  I have emphasized the importance of this follow-up especially given the HIV status of this patient.   Final diagnoses:  Viral URI with cough    ED Discharge Orders         Ordered    HYDROcodone-homatropine (HYCODAN) 5-1.5 MG/5ML syrup  Every 6 hours PRN     05/15/18 1814    loratadine-pseudoephedrine (CLARITIN-D 12 HOUR) 5-120 MG tablet  2 times daily     05/15/18 1814           Lily Kocher, PA-C 05/15/18 1823    Julianne Rice, MD 05/15/18 [phone]

## 2018-05-15 NOTE — ED Triage Notes (Signed)
Pt c/o sneezing and headache that started x 5 days ago and today she started with a cough and right ear pain

## 2018-05-15 NOTE — Discharge Instructions (Addendum)
Your blood pressure is elevated today, please have this rechecked soon.  Your influenza test and chest x-ray are both negative.  Please increase your fluids.  Wash hands frequently.  Use your mask until symptoms have resolved.  Use Claritin-D for congestion.  Use Tylenol every 4 hours, or ibuprofen every 6 hours for fever, and or body aching.  Use Hycodan for cough. This medication may cause drowsiness. Please do not drink, drive, or participate in activity that requires concentration while taking this medication.  Please follow-up with your primary physician to ensure complete resolution of this illness.

## 2018-05-25 ENCOUNTER — Ambulatory Visit (INDEPENDENT_AMBULATORY_CARE_PROVIDER_SITE_OTHER): Payer: BLUE CROSS/BLUE SHIELD | Admitting: Pharmacist

## 2018-05-25 ENCOUNTER — Other Ambulatory Visit: Payer: Self-pay

## 2018-05-25 ENCOUNTER — Telehealth: Payer: Self-pay | Admitting: Pharmacy Technician

## 2018-05-25 ENCOUNTER — Other Ambulatory Visit (HOSPITAL_COMMUNITY)
Admission: RE | Admit: 2018-05-25 | Discharge: 2018-05-25 | Disposition: A | Discharge status: Home / Self Care | Payer: BLUE CROSS/BLUE SHIELD | Source: Ambulatory Visit | Attending: Internal Medicine | Admitting: Internal Medicine

## 2018-05-25 DIAGNOSIS — B20 Human immunodeficiency virus [HIV] disease: Secondary | ICD-10-CM | POA: Diagnosis not present

## 2018-05-25 DIAGNOSIS — Z79899 Other long term (current) drug therapy: Secondary | ICD-10-CM | POA: Diagnosis not present

## 2018-05-25 LAB — T-HELPER CELL (CD4) - (RCID CLINIC ONLY)
CD4 % Helper T Cell: 10 % — ABNORMAL LOW (ref 33–55)
CD4 T Cell Abs: 110 /uL — ABNORMAL LOW (ref 400–2700)

## 2018-05-25 MED ORDER — BICTEGRAVIR-EMTRICITAB-TENOFOV 50-200-25 MG PO TABS: 1.0000 | ORAL_TABLET | Freq: Every day | ORAL | 5 refills | Status: DC

## 2018-05-25 MED ORDER — SULFAMETHOXAZOLE-TRIMETHOPRIM 400-80 MG PO TABS: 1.0000 | ORAL_TABLET | Freq: Every day | ORAL | 5 refills | Status: DC

## 2018-05-25 MED FILL — SULFAMETHOXAZOLE-TMP SS TAB: 400-80 | 30 days supply | Qty: 30 | Fill #0

## 2018-05-25 MED FILL — BIKTARVY 50-200-25 MG TABS: 50-200-25 | 30 days supply | Qty: 30 | Fill #0

## 2018-05-25 NOTE — Progress Notes (Addendum)
HPI: Kimberly Bishop is a 28 y.o. female who presents to the [facility] clinic for HIV follow-up, and a return to care. Last [facility] clinic visit 04/26/2017, and patient has a history of no shows for appointments (x3 since last visit).  Patient reported by phone 04/27/2018 that she had been out of her medications for "a while", and requested refills. Patient presented to ED 05/15/2018 for viral URI with cough.  Patient Active Problem List   Diagnosis Date Noted   Screening examination for venereal disease 03/28/2017   Encounter for long-term (current) use of high-risk medication 03/28/2017   Nephrolith 12/05/2016   Vaccine counseling 12/01/2016   Molluscum contagiosum 03/22/2016   At risk for opportunistic infections 03/22/2016   History of opportunistic infections 03/22/2016   Pruritic rash 02/26/2015   HIV disease ([place]) 07/03/2012   Perinatal HIV exposure 12/16/2011    Patient's Medications  New Prescriptions   No medications on file  Previous Medications   No medications on file  Modified Medications   Modified Medication Previous Medication   BICTEGRAVIR-EMTRICITABINE-TENOFOVIR AF (BIKTARVY) 50-200-25 MG TABS TABLET bictegravir-emtricitabine-tenofovir AF (BIKTARVY) 50-200-25 MG TABS tablet      Take 1 tablet by mouth daily.    Take 1 tablet by mouth daily.   SULFAMETHOXAZOLE-TRIMETHOPRIM (BACTRIM,SEPTRA) 400-80 MG TABLET sulfamethoxazole-trimethoprim (BACTRIM,SEPTRA) 400-80 MG tablet      Take 1 tablet by mouth daily.    Take 1 tablet by mouth daily.  Discontinued Medications   HYDROCODONE-HOMATROPINE (HYCODAN) 5-1.5 MG/5ML SYRUP    Take 5 mLs by mouth every 6 (six) hours as needed.   HYDROXYZINE (ATARAX/VISTARIL) 25 MG TABLET    Take 1 tablet (25 mg total) by mouth every 8 (eight) hours as needed for itching.   LORATADINE-PSEUDOEPHEDRINE (CLARITIN-D 12 HOUR) 5-120 MG TABLET    Take 1 tablet by mouth 2 (two) times daily.   OSELTAMIVIR (TAMIFLU) 75 MG CAPSULE    Take 1  capsule (75 mg total) by mouth every 12 (twelve) hours.   PODOFILOX (CONDYLOX) 0.5 % EXTERNAL SOLUTION    Apply topically 2 (two) times daily.    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 ([place])    History of kidney stones    HIV (human immunodeficiency virus infection) ([place])    Immune deficiency disorder ([place])     Social History: Social History   Socioeconomic History   Marital status: Single    Spouse name: Not on file   Number of children: Not on file   Years of education: Not on file   Highest education level: Not on file  Occupational History   Not on file  Social Needs   Financial resource strain: Not on file   Food insecurity:    Worry: Not on file    Inability: Not on file   Transportation needs:    Medical: Not on file    Non-medical: Not on file  Tobacco Use   Smoking status: Never Smoker   Smokeless tobacco: Never Used  Substance and Sexual Activity   Alcohol use: No    Comment: occasional   Drug use: No   Sexual activity: Never    Partners: Male    Birth control/protection: None  Lifestyle   Physical activity:    Days per week: Not on file    Minutes per session: Not on file   Stress: Not on file  Relationships   Social connections:  Talks on phone: Not on file    Gets together: Not on file    Attends religious service: Not on file    Active member of club or organization: Not on file    Attends meetings of clubs or organizations: Not on file    Relationship status: Not on file  Other Topics Concern   Not on file  Social History Narrative   Not on file    Labs: Lab Results  Component Value Date   HIV1RNAQUANT 1,130 (H) 04/26/2017   HIV1RNAQUANT 17,500 (H) 03/28/2017   HIV1RNAQUANT 94 (H) 01/03/2017   CD4TABS 200 (L) 04/26/2017   CD4TABS 180 (L) 03/28/2017   CD4TABS 120 (L) 01/03/2017    RPR and STI Lab  Results  Component Value Date   LABRPR NON-REACTIVE 04/26/2017   LABRPR NON-REACTIVE 03/28/2017   LABRPR Non Reactive 02/07/2015   LABRPR NON REAC 05/19/2014   LABRPR NON REAC 03/28/2013    STI Results GC GC CT CT  Latest Ref Rng & Units - NEGATIVE - NEGATIVE  03/28/2017 Negative - Negative -  02/11/2017 Negative - Negative -  07/07/2016 Negative - Negative -  10/27/2015 Negative - Negative -  02/07/2015 Negative - **POSITIVE**(A) -  05/19/2014 NG: Negative - CT: Negative -  12/23/2013 - NEGATIVE - NEGATIVE  05/16/2013 NG: Negative - CT: Negative -  10/22/2012 - NEGATIVE - NEGATIVE  02/12/2012 - NEGATIVE - NEGATIVE    Hepatitis B Lab Results  Component Value Date   HEPBSAB NEG 12/09/2011   HEPBSAG NEGATIVE 12/09/2011   HEPBCAB NEG 12/09/2011   Hepatitis C No results found for: HEPCAB, HCVRNAPCRQN Hepatitis A Lab Results  Component Value Date   HAV NEG 12/09/2011   Lipids: Lab Results  Component Value Date   CHOL 177 03/28/2017   TRIG 58 03/28/2017   HDL 44 (L) 03/28/2017   CHOLHDL 4.0 03/28/2017   VLDL 9 10/27/2015   LDLCALC 119 (H) 03/28/2017    Current HIV Regimen: Biktarvy  Assessment: Kimberly Bishop is a 28 y.o. female who presents to the [facility] clinic for HIV follow-up, and a return to care. Patient reports feeling well today, that her viral illness from 05/15/2018 has resolved.   Patient has been out of care since 04/26/2017. Patient reported the reason she missed appointments due to forgetfulness.  Patient reported by phone 04/27/2018 that she had been out of her medications for "a while", and requested refills. Patient could not remember approximate date of last dose. Patient endorsed that when she was taking her medication, she took ~3doses/week for about 4 months. She reports the reasons for missed doses are that she is out and about, and forgetfulness.  She reports that she now has someone to help her remember to take her medications, and reminders.  Patient also reported that Biktarvy made her stomach hurt, nausea-like feeling, and that this also made her not want to take her medication.  Upon discussion with pharmacist, patient reported that she takes her medication on an empty stomach, and that the nausea only happened once, not every time she takes [place]. Patient also reported past therapies of [place], Tivicay, Truvada, and Sustiva. Patient reported she likes smaller pills, likes only taking one pill a day, and that taking pills is problematic for her. Pharmacist recommended taking medication with food to reduce stomach upset and nausea, even if it is only a few crackers to settle her stomach.  Patient repeatedly mentioned that since she is taking it in the morning or  during the day when she is out and about that she doesn't have food to take it with. Pharmacist recommended carrying some crackers or another small snack in her purse. Patient currently has Pharmacist, community which covers her ART.  At last clinic visit, 04/26/2017, patient reported that she was interested in getting pregnant.  She has not yet, but is still interested in doing so at some point in the future.  Pharmacist discussed risk of HIV transmission from mother to child, and how important adherence and viral suppression were to lowering the risk of transmission to child.  Pharmacist also explained that we would need to manage patient's ART medications during pregnancy to ensure effective therapy (viral suppression) to mother, and lower risks from medications to fetus. Patient reported that she is sexually active, but it is not possible that she is pregnant today, so pregnancy test deferred. Patient expressed no concerns about STIs.  When asked about general health concerns, patient endorsed a sharp pain in her left flank, on her back, that lasted for ~15 minutes, then went away and has not reoccurred.  It reminded her of the pain she experienced when she previously had kidney  stones, only that occurred on the right side.   Based on her phone call, she has not had medications in 1-2 months. Past highly variable HIV viral load (VL of 17,500 on 03/28/17 and 1,130 on 04/26/17) and CD4 counts with no clear downward trend while on medication (apex 200 on 04/26/17), combined with patient reporting of low adherence, indicates that patient has a high risk for developing resistance. Of note, patient HIV RNA was last undetectable on 02/26/2015.  Based on transient nature of nausea, patient will continue on Biktarvy at this time, unless HIV genotype results show resistance. Based on patient's historically low CD4 count, and recent viral illness, patient is at high risk for opportunistic infection, so will start Bactrim for PCP prophylaxis until CD4 count recovery > 200.  Patient preferred to pick up medications from [facility] today, and have them mailed in future months. Her address was confirmed.  Patient was offered a flu shot today, and she refused it.  Plan: - Labs today: HIV RNA viral load, HIV genotyping, CD4 count, CBC, CMP, RPR, CH/GN (urine) - Prescription for Biktarvy and Bactrim sent to [facility] for pickup today, and to be mailed in subsequent months. - Follow-up in 6 weeks (07/06/2018) with pharmacist   Alden Benjamin, PharmD Candidate, [place] for Infectious Disease 05/25/2018, 11:09 AM

## 2018-05-25 NOTE — Telephone Encounter (Signed)
[facility] Patient Advocate Encounter    Findings of the benefits investigation conducted this morning via test claims for the patient's upcoming appointment on 05/25/2018 are as follows:   Insurance: MEDCOPUB active Psychologist, sport and exercise run with drug historically used, will run another test claim if new drug prescribed Estimated copay amount: $0 (with historic drug) Prior Authorization: not required at this time  [facility] Patient Advocate will follow up once patient arrives for their appointment.  Bartholomew Crews, CPhT Specialty Pharmacy Patient [facility] for Infectious Disease Phone: [phone] Fax: [phone] 05/25/2018 10:25 AM

## 2018-05-28 LAB — URINE CYTOLOGY ANCILLARY ONLY
CHLAMYDIA, DNA PROBE: POSITIVE — AB
Neisseria Gonorrhea: NEGATIVE

## 2018-05-29 ENCOUNTER — Telehealth: Payer: Self-pay | Admitting: Pharmacist

## 2018-05-29 NOTE — Telephone Encounter (Signed)
Patient returned call. She works until 6pm every day and is off on Friday.  Made her an appointment for Friday 3/20 at 10am for treatment.  She may possibly be able to get off early one day before then and will call me if she is able to do so.  She will need azithromycin 1gm PO x 1.

## 2018-05-29 NOTE — Telephone Encounter (Signed)
Patient's urine cytology came back positive for chlamydia.  Called patient to relay results and make an appointment for treatment but no one answered. Left HIPAA compliant voicemail. Will try again later.

## 2018-05-30 NOTE — Telephone Encounter (Signed)
Thanks

## 2018-05-31 ENCOUNTER — Ambulatory Visit (INDEPENDENT_AMBULATORY_CARE_PROVIDER_SITE_OTHER): Payer: BLUE CROSS/BLUE SHIELD | Admitting: *Deleted

## 2018-05-31 ENCOUNTER — Other Ambulatory Visit: Payer: Self-pay

## 2018-05-31 DIAGNOSIS — A749 Chlamydial infection, unspecified: Secondary | ICD-10-CM

## 2018-05-31 MED ORDER — AZITHROMYCIN 250 MG PO TABS
1000.0000 mg | ORAL_TABLET | Freq: Once | ORAL | Status: AC
  Administered 2018-05-31: 1000 mg via ORAL

## 2018-05-31 NOTE — Telephone Encounter (Signed)
Patient called and will be able to come in today for treatment as she is getting off work early.  Rescheduled her for 2pm today.

## 2018-05-31 NOTE — Progress Notes (Signed)
Patient given ginger ale and granola bar with the azithromycin for treatment of chlamydia. Encouraged her to ask her partner to get tested/treated to reduce the risk of reinfection.  Encouraged her to keep taking her biktarvy.  Offered/declined condoms. Landis Gandy, RN

## 2018-06-01 ENCOUNTER — Ambulatory Visit: Payer: Managed Care, Other (non HMO)

## 2018-06-05 LAB — COMPREHENSIVE METABOLIC PANEL
AG Ratio: 1.2 (calc) (ref 1.0–2.5)
ALT: 26 U/L (ref 6–29)
AST: 21 U/L (ref 10–30)
Albumin: 4.6 g/dL (ref 3.6–5.1)
Alkaline phosphatase (APISO): 108 U/L (ref 31–125)
BUN: 11 mg/dL (ref 7–25)
CO2: 26 mmol/L (ref 20–32)
CREATININE: 0.85 mg/dL (ref 0.50–1.10)
Calcium: 9.2 mg/dL (ref 8.6–10.2)
Chloride: 107 mmol/L (ref 98–110)
Globulin: 3.8 g/dL (calc) — ABNORMAL HIGH (ref 1.9–3.7)
Glucose, Bld: 88 mg/dL (ref 65–99)
Potassium: 4.2 mmol/L (ref 3.5–5.3)
Sodium: 140 mmol/L (ref 135–146)
TOTAL PROTEIN: 8.4 g/dL — AB (ref 6.1–8.1)
Total Bilirubin: 0.3 mg/dL (ref 0.2–1.2)

## 2018-06-05 LAB — CBC
HCT: 38 % (ref 35.0–45.0)
Hemoglobin: 12.6 g/dL (ref 11.7–15.5)
MCH: 29.1 pg (ref 27.0–33.0)
MCHC: 33.2 g/dL (ref 32.0–36.0)
MCV: 87.8 fL (ref 80.0–100.0)
MPV: 11.5 fL (ref 7.5–12.5)
Platelets: 243 10*3/uL (ref 140–400)
RBC: 4.33 10*6/uL (ref 3.80–5.10)
RDW: 12.7 % (ref 11.0–15.0)
WBC: 4.7 10*3/uL (ref 3.8–10.8)

## 2018-06-05 LAB — HIV-1 INTEGRASE GENOTYPE

## 2018-06-05 LAB — HIV RNA, RTPCR W/R GT (RTI, PI,INT)
HIV 1 RNA Quant: 6270 copies/mL — ABNORMAL HIGH
HIV-1 RNA Quant, Log: 3.8 Log copies/mL — ABNORMAL HIGH

## 2018-06-05 LAB — RPR: RPR Ser Ql: NONREACTIVE

## 2018-06-05 LAB — HIV-1 GENOTYPE: HIV-1 Genotype: DETECTED — AB

## 2018-06-22 MED FILL — SULFAMETHOXAZOLE-TMP SS TAB: 400-80 | 30 days supply | Qty: 30 | Fill #1

## 2018-06-22 MED FILL — BIKTARVY 50-200-25 MG TABS: 50-200-25 | 30 days supply | Qty: 30 | Fill #1

## 2018-07-05 ENCOUNTER — Ambulatory Visit (INDEPENDENT_AMBULATORY_CARE_PROVIDER_SITE_OTHER): Payer: BLUE CROSS/BLUE SHIELD | Admitting: Pharmacist

## 2018-07-05 ENCOUNTER — Other Ambulatory Visit: Payer: Self-pay

## 2018-07-05 ENCOUNTER — Other Ambulatory Visit: Payer: Self-pay | Admitting: Pharmacist

## 2018-07-05 DIAGNOSIS — B20 Human immunodeficiency virus [HIV] disease: Secondary | ICD-10-CM | POA: Diagnosis not present

## 2018-07-05 NOTE — Progress Notes (Signed)
Labs for 4/24 visit

## 2018-07-06 ENCOUNTER — Other Ambulatory Visit: Payer: No Typology Code available for payment source

## 2018-07-06 ENCOUNTER — Ambulatory Visit: Payer: Managed Care, Other (non HMO) | Admitting: Pharmacist

## 2018-07-06 NOTE — Progress Notes (Signed)
Virtual Visit via Telephone Note  I connected with Kimberly Bishop on 07/05/2018 at  1:45 PM EDT by telephone and verified that I am speaking with the correct person using two identifiers.   I discussed the limitations, risks, security and privacy concerns of performing an evaluation and management service by telephone and the availability of in person appointments. I also discussed with the patient that there may be a patient responsible charge related to this service. The patient expressed understanding and agreed to proceed.  HPI: Kimberly Bishop is a 28 y.o. female who presents for HIV follow-up.  Patient Active Problem List   Diagnosis Date Noted  . Screening examination for venereal disease 03/28/2017  . Encounter for long-term (current) use of high-risk medication 03/28/2017  . 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 Vaccine counseling 12/01/2016  . Molluscum contagiosum 03/22/2016  . At risk for opportunistic infections 03/22/2016  . History of opportunistic infections 03/22/2016  . Pruritic rash 02/26/2015  . HIV disease ([place]) 07/03/2012  . Perinatal HIV exposure 12/16/2011    Patient's Medications  New Prescriptions   No medications on file  Previous Medications   BICTEGRAVIR-EMTRICITABINE-TENOFOVIR AF (BIKTARVY) 50-200-25 MG TABS TABLET    Take 1 tablet by mouth daily.   SULFAMETHOXAZOLE-TRIMETHOPRIM (BACTRIM,SEPTRA) 400-80 MG TABLET    Take 1 tablet by mouth daily.  Modified Medications   No medications on file  Discontinued Medications   No medications on file    Allergies: No Known Allergies  Past Medical History: Past Medical History:  Diagnosis Date  . 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 History of kidney stones   . HIV (human immunodeficiency virus infection) ([place])   . Immune deficiency disorder [facility])     Social History: Social History   Socioeconomic History  . Marital status: Single    Spouse name: Not on file  . Number of  children: Not on file  . Years of education: Not on file  . Highest education level: Not on file  Occupational History  . Not on file  Social Needs  . Financial resource strain: Not on file  . Food insecurity:    Worry: Not on file    Inability: Not on file  . Transportation needs:    Medical: Not on file    Non-medical: Not on file  Tobacco Use  . Smoking status: Never Smoker  . Smokeless tobacco: Never Used  Substance and Sexual Activity  . Alcohol use: No    Comment: occasional  . Drug use: No  . Sexual activity: Never    Partners: Male    Birth control/protection: None  Lifestyle  . Physical activity:    Days per week: Not on file    Minutes per session: Not on file  . Stress: Not on file  Relationships  . Social connections:    Talks on phone: Not on file    Gets together: Not on file    Attends religious service: Not on file    Active member of club or organization: Not on file    Attends meetings of clubs or organizations: Not on file    Relationship status: Not on file  Other Topics Concern  . Not on file  Social History Narrative  . Not on file    Labs: Lab Results  Component Value Date   HIV1RNAQUANT 6,270 (H) 05/25/2018   HIV1RNAQUANT 1,130 (H) 04/26/2017   HIV1RNAQUANT 17,500 (H) 03/28/2017   CD4TABS 110 (L) 05/25/2018  CD4TABS 200 (L) 04/26/2017   CD4TABS 180 (L) 03/28/2017    RPR and STI Lab Results  Component Value Date   LABRPR NON-REACTIVE 05/25/2018   LABRPR NON-REACTIVE 04/26/2017   LABRPR NON-REACTIVE 03/28/2017   LABRPR Non Reactive 02/07/2015   LABRPR NON REAC 05/19/2014    STI Results GC GC CT CT  Latest Ref Rng & Units - NEGATIVE - NEGATIVE  05/25/2018 Negative - **POSITIVE**(A) -  03/28/2017 Negative - Negative -  02/11/2017 Negative - Negative -  07/07/2016 Negative - Negative -  10/27/2015 Negative - Negative -  02/07/2015 Negative - **POSITIVE**(A) -  05/19/2014 NG: Negative - CT: Negative -  12/23/2013 - NEGATIVE -  NEGATIVE  05/16/2013 NG: Negative - CT: Negative -  10/22/2012 - NEGATIVE - NEGATIVE  02/12/2012 - NEGATIVE - NEGATIVE    Hepatitis B Lab Results  Component Value Date   HEPBSAB NEG 12/09/2011   HEPBSAG NEGATIVE 12/09/2011   HEPBCAB NEG 12/09/2011   Hepatitis C No results found for: HEPCAB, HCVRNAPCRQN Hepatitis A Lab Results  Component Value Date   HAV NEG 12/09/2011   Lipids: Lab Results  Component Value Date   CHOL 177 03/28/2017   TRIG 58 03/28/2017   HDL 44 (L) 03/28/2017   CHOLHDL 4.0 03/28/2017   VLDL 9 10/27/2015   LDLCALC 119 (H) 03/28/2017    Current HIV Regimen: Biktarvy  Assessment: I connected with patient over the phone and conducted an evisit for this encounter. I recently saw Kimberly Bishop back in March after she had been out of care for a year. She has had issues in the past with poor compliance and had been off of medications for quite some time.  I restarted her on Biktarvy and Bactrim and had her follow-up with me in 1 month.  She states she is doing ok today and taking her Biktarvy and Bactrim every day.  She is having some nausea but states that it isn't every day and seems to be getting better as the time goes on.  She takes the [place] before bed and says that the nausea happens a few minutes after.  I offered some zofran but told her to push through and that hopefully it will resolve.   She was set to have labs drawn and tells me that she will still come in tomorrow to have labs drawn.  She needs to have Friday follow-up appointments because that is the day she is off of work.  Dr. Linus Salmons does not have anything open on Fridays, so will make an appointment with one of our nurse practitioners to get her back into care.  She is fine with the plan.  Plan: - Continue Biktarvy PO once daily - Continue Bactrim SS 1 tab PO once daily - HIV VL and CD4 - F/u with Kimberly Bishop 6/26 at 11am  I discussed the assessment and treatment plan with the patient. The patient was  provided an opportunity to ask questions and all were answered. The patient agreed with the plan and demonstrated an understanding of the instructions.   The patient was advised to call back or seek an in-person evaluation if the symptoms worsen or if the condition fails to improve as anticipated.  I provided 13 minutes of non-face-to-face time during this encounter.    L. , PharmD, BCIDP, AAHIVP, [place] for Infectious Disease 07/06/2018, 4:44 PM

## 2018-07-25 ENCOUNTER — Telehealth: Payer: Self-pay | Admitting: Pharmacy Technician

## 2018-07-25 MED FILL — BIKTARVY 50-200-25 MG TABS: 50-200-25 | 30 days supply | Qty: 30 | Fill #2

## 2018-07-25 MED FILL — SULFAMETHOXAZOLE-TMP SS TAB: 400-80 | 30 days supply | Qty: 30 | Fill #2

## 2018-07-25 NOTE — Telephone Encounter (Signed)
[facility] Patient Advocate Encounter   Was successful in obtaining a [place] copay card for [employer].  This copay card will make the patients copay 0.  I have spoken with the pharmacy.    The billing information is as follows and has been shared with [place].  RxBin: [id number] PCN: [id number] Member ID: [id number] Group ID: [place] Nadara Mustard [place] Patient [facility] Phone: [phone] Fax:  [phone]

## 2018-08-22 ENCOUNTER — Telehealth: Payer: Self-pay

## 2018-08-22 ENCOUNTER — Telehealth: Payer: Self-pay | Admitting: Pharmacist

## 2018-08-22 NOTE — Telephone Encounter (Signed)
Patient called to tell me that she was ~[redacted] weeks pregnant.  She is currently taking Biktarvy. She wanted to know if the medication was ok and what she should do.  Advised her that [place] hasn't been studied in pregnancy. She will come see me on Monday 6/15 for medication change/adherence counseling.  She has struggled with taking her ART in the past so I will strongly emphasize the importance since she is pregnant.  Will keep a close eye on her and make frequent visit between [place] and I to keep her on track.

## 2018-08-22 NOTE — Telephone Encounter (Signed)
Patient called office today requesting to speak with Cassie, Pharmacist regarding her [place]. Patient states that she took three pregnancy test and they all came back positive. Patient would like to know if it is okay to take [place]. Transferred call to [facility], [employer] who will take message for Cassie. Will route call to Cassie as well Aundria Rud, [place]

## 2018-08-22 NOTE — Telephone Encounter (Signed)
Talked with patient - thanks Cassandria Santee!

## 2018-08-22 NOTE — Telephone Encounter (Signed)
Sounds great - would love to tag team with her. Maybe if she would be interested in Ambre's support too that would be helpful?   Would you also have her start taking prenatal vitamin (any of them from the pharmacy is fine) that has at least 034 mcg folic acid once daily when you see her please?   What do you think about using PI regimen for her vs Isentress/Truvada? I worry about her adherence.Marland KitchenMarland Kitchen

## 2018-08-23 NOTE — Telephone Encounter (Signed)
That's a good idea.Kimberly Bishop float the Ambre idea to her.   Yes, will definitely have her start a prenatal vitamin w/ folic acid.   That's a good thought.. would probably prefer her to be on Prezista/Norvir/Truvada.. just getting her to take 5 pills per day may be an issues as the Prezista and Novir would need to be BID due to the volume of distribution differences in pregnany.

## 2018-08-23 NOTE — Telephone Encounter (Signed)
Sounds like we will need to do some negotiation with her and what she is willing/able to do. Maybe PI until we get to the second trimester and can do the tivicay/Truvada for higher barrier vs the isentress?   We shall see - thank you for talking to her.

## 2018-08-23 NOTE — Telephone Encounter (Signed)
Sounds good to me

## 2018-08-24 ENCOUNTER — Telehealth: Payer: Self-pay | Admitting: Pharmacist

## 2018-08-24 NOTE — Telephone Encounter (Signed)
COVID-19 Pre-Screening Questions: ° °Do you currently have a fever (>100 °F), chills or unexplained body aches?no  °Are you currently experiencing new cough, shortness of breath, sore throat, runny nose?no  °•  °Have you recently travelled outside the state of [place] in the last 14 days?no  °•  °1. Have you been in contact with someone that is currently pending confirmation of Covid19 testing or has been confirmed to have the Covid19 virus?  No  ° °

## 2018-08-27 ENCOUNTER — Other Ambulatory Visit: Payer: Self-pay

## 2018-08-27 ENCOUNTER — Ambulatory Visit (INDEPENDENT_AMBULATORY_CARE_PROVIDER_SITE_OTHER): Payer: Self-pay | Admitting: Pharmacist

## 2018-08-27 ENCOUNTER — Telehealth: Payer: Self-pay | Admitting: Pharmacy Technician

## 2018-08-27 ENCOUNTER — Encounter: Payer: Self-pay | Admitting: Infectious Diseases

## 2018-08-27 ENCOUNTER — Other Ambulatory Visit: Payer: Self-pay | Admitting: *Deleted

## 2018-08-27 DIAGNOSIS — Z3200 Encounter for pregnancy test, result unknown: Secondary | ICD-10-CM

## 2018-08-27 DIAGNOSIS — B20 Human immunodeficiency virus [HIV] disease: Secondary | ICD-10-CM

## 2018-08-27 MED ORDER — EMTRICITABINE-TENOFOVIR DF 200-300 MG PO TABS: 1.0000 | ORAL_TABLET | Freq: Every day | ORAL | 11 refills | Status: DC

## 2018-08-27 MED ORDER — RALTEGRAVIR POTASSIUM 400 MG PO TABS: 400.0000 mg | ORAL_TABLET | Freq: Two times a day (BID) | ORAL | 11 refills | Status: DC

## 2018-08-27 NOTE — Telephone Encounter (Signed)
[facility] Patient Advocate Encounter  The patient [place] medications.  She was approved for a 30 day fill of Truvada.  If she should need it, the pharmacy information is as follows: ID [id number] BIN [id number] PCN [id number] GRP [id number] She most likely will be approved for HMAP or get her Medicaid changed before needing patient assistance.  An application was generated for Isentress that we will hold in case she needs more than the 30 days given her today.  Venida Jarvis. Nadara Mustard [place] Patient [facility] for Infectious Disease Phone: [phone] Fax:  [phone]

## 2018-08-27 NOTE — Progress Notes (Signed)
HPI: Kimberly Bishop is a 28 y.o. female who presents to the [facility] for HIV follow-up.  Patient Active Problem List   Diagnosis Date Noted   Screening examination for venereal disease 03/28/2017   Encounter for long-term (current) use of high-risk medication 03/28/2017   Nephrolith 12/05/2016   Vaccine counseling 12/01/2016   Molluscum contagiosum 03/22/2016   At risk for opportunistic infections 03/22/2016   History of opportunistic infections 03/22/2016   Pruritic rash 02/26/2015   HIV disease ([place]) 07/03/2012   Perinatal HIV exposure 12/16/2011    Patient's Medications  New Prescriptions   EMTRICITABINE-TENOFOVIR (TRUVADA) 200-300 MG TABLET    Take 1 tablet by mouth daily.   RALTEGRAVIR (ISENTRESS) 400 MG TABLET    Take 1 tablet (400 mg total) by mouth 2 (two) times daily.  Previous Medications   SULFAMETHOXAZOLE-TRIMETHOPRIM (BACTRIM,SEPTRA) 400-80 MG TABLET    Take 1 tablet by mouth daily.  Modified Medications   No medications on file  Discontinued Medications   BICTEGRAVIR-EMTRICITABINE-TENOFOVIR AF (BIKTARVY) 50-200-25 MG TABS TABLET    Take 1 tablet by mouth daily.    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 ([place])    History of kidney stones    HIV (human immunodeficiency virus infection) ([place])    Immune deficiency disorder ([place])     Social History: Social History   Socioeconomic History   Marital status: Single    Spouse name: Not on file   Number of children: Not on file   Years of education: Not on file   Highest education level: Not on file  Occupational History   Not on file  Social Needs   Financial resource strain: Not on file   Food insecurity    Worry: Not on file    Inability: Not on file   Transportation needs    Medical: Not on file    Non-medical: Not on file  Tobacco Use   Smoking  status: Never Smoker   Smokeless tobacco: Never Used  Substance and Sexual Activity   Alcohol use: No    Comment: occasional   Drug use: No   Sexual activity: Never    Partners: Male    Birth control/protection: None  Lifestyle   Physical activity    Days per week: Not on file    Minutes per session: Not on file   Stress: Not on file  Relationships   Social connections    Talks on phone: Not on file    Gets together: Not on file    Attends religious service: Not on file    Active member of club or organization: Not on file    Attends meetings of clubs or organizations: Not on file    Relationship status: Not on file  Other Topics Concern   Not on file  Social History Narrative   Not on file    Labs: Lab Results  Component Value Date   HIV1RNAQUANT 6,270 (H) 05/25/2018   HIV1RNAQUANT 1,130 (H) 04/26/2017   HIV1RNAQUANT 17,500 (H) 03/28/2017   CD4TABS 110 (L) 05/25/2018   CD4TABS 200 (L) 04/26/2017   CD4TABS 180 (L) 03/28/2017    RPR and STI Lab Results  Component Value Date   LABRPR NON-REACTIVE 05/25/2018   LABRPR NON-REACTIVE 04/26/2017   LABRPR NON-REACTIVE 03/28/2017   LABRPR Non Reactive 02/07/2015   LABRPR NON REAC 05/19/2014    STI Results GC GC CT  CT  Latest Ref Rng & Units - NEGATIVE - NEGATIVE  05/25/2018 Negative - **POSITIVE**(A) -  03/28/2017 Negative - Negative -  02/11/2017 Negative - Negative -  07/07/2016 Negative - Negative -  10/27/2015 Negative - Negative -  02/07/2015 Negative - **POSITIVE**(A) -  05/19/2014 NG: Negative - CT: Negative -  12/23/2013 - NEGATIVE - NEGATIVE  05/16/2013 NG: Negative - CT: Negative -  10/22/2012 - NEGATIVE - NEGATIVE  02/12/2012 - NEGATIVE - NEGATIVE    Hepatitis B Lab Results  Component Value Date   HEPBSAB NEG 12/09/2011   HEPBSAG NEGATIVE 12/09/2011   HEPBCAB NEG 12/09/2011   Hepatitis C No results found for: HEPCAB, HCVRNAPCRQN Hepatitis A Lab Results  Component Value Date   HAV NEG  12/09/2011   Lipids: Lab Results  Component Value Date   CHOL 177 03/28/2017   TRIG 58 03/28/2017   HDL 44 (L) 03/28/2017   CHOLHDL 4.0 03/28/2017   VLDL 9 10/27/2015   LDLCALC 119 (H) 03/28/2017    Current HIV Regimen: Biktarvy  Assessment: Kimberly Bishop is here today to follow-up for her HIV infection.  I saw her several months ago and restarted her ART.  She has been taking Biktarvy and is tolerating it very well she states with no side effects or issues swallowing the medication.  She called me last week to tell me that she was pregnant.  She comes in today to discuss switching her ART.  This is her first pregnancy and was unplanned according to her.  We do not have any data on Biktarvy in pregnancy, so I discussed switched her regimen to something we know is safe to the mother and fetus. She has had many struggles in the past with consistently taking her ART with detectable viral loads as far back as I can see.  I spent a great deal of time explaining the importance of staying on medications and being undetectable in order to have the highest chances of not transferring the virus to the baby during childbirth.  I explained that if she were to be detectable during labor that they would have to do a c-section and her baby would have to take ART for a certain amount of time (depending if they were positive or negative).  She seemed very concerned about this and eager to do whatever she can to prevent that from happening.  Ideally, I would like her to be on a PI-based regimen with her adherence history.  I explained Prezista BID + Norvir BID + Truvada daily to her and how she would take this.  She was previously on Norvir and states that she could not swallow it and when she did, it would make her sick.  She tells me that this is one of the main reasons she stopped taking her ART. After much discussion, we decided to trial Isentress BID + Truvada daily.  I explained the importance of taking this every  day and being sure to take both doses of Isentress.  Explained that if she were to develop Isentress resistance that it could potentially cause her not to be able to take [place] again in the future.  Once she gets past her 1st trimester, we can switch her to [place] for less pill burden.  Went over the medication regimen with her multiple times and she understands fully.    She is taking a prenatal vitamin right now that she believes has folic acid in it, but I told her to make  sure she is taking 400 mcg daily.  She will look when she gets home.  Tammy is helping with a referral to a OB/GYN for her as she does not currently have one.  Of note, she is not taking her Bactrim as she states that she cannot tolerate it.  Hopefully her CD4 count has rebounded, but I will defer starting her on anything else right now and focus on her ART.  Her commercial insurance is no longer active and she only has family planning Medicaid right now.  She met with Juliann Pulse to apply for HMAP, and I was able to get her a 30 day supply of both Isentress and Truvada.  We can get her more medication if she needs it past the 30 days, but I believe she will be approved by then for HMAP. Answered all of her questions today.  She will see [name] in follow-up in ~4 weeks. We will rotate keeping a close eye on her with lab and office visits throughout her pregnancy.  I will check labs today to confirm her pregnancy and check her viral load. She knows to call me with any issues.  Plan: - Stop Biktarvy - Start Isentress PO BID - Start Truvada PO daily - HCG level, HIV RNA, CD4 today - F/u with [name] 7/21 at 1015am  Cleston Lautner L. Bradan Congrove, PharmD, BCIDP, AAHIVP, [facility] Infectious [facility] 08/27/2018, 2:42 PM

## 2018-08-28 LAB — T-HELPER CELL (CD4) - (RCID CLINIC ONLY)
CD4 % Helper T Cell: 10 % — ABNORMAL LOW (ref 33–65)
CD4 T Cell Abs: 90 /uL — ABNORMAL LOW (ref 400–1790)

## 2018-08-29 ENCOUNTER — Other Ambulatory Visit: Payer: Self-pay | Admitting: Internal Medicine

## 2018-08-29 ENCOUNTER — Telehealth: Payer: Self-pay | Admitting: Medical

## 2018-08-29 DIAGNOSIS — O0991 Supervision of high risk pregnancy, unspecified, first trimester: Secondary | ICD-10-CM

## 2018-08-29 NOTE — Telephone Encounter (Signed)
Called the patient to confirm upcoming virtual appointment. The patient verbalized understanding and has the app downloaded. Informed of how to access the appointment by tapping on the appointment via a smart phone. A link will appear to click to begin virtual visit. Please log in 5-10 mins prior to the appointment time.

## 2018-09-01 LAB — CBC
HCT: 42.3 % (ref 35.0–45.0)
Hemoglobin: 14.1 g/dL (ref 11.7–15.5)
MCH: 30.1 pg (ref 27.0–33.0)
MCHC: 33.3 g/dL (ref 32.0–36.0)
MCV: 90.4 fL (ref 80.0–100.0)
MPV: 12.2 fL (ref 7.5–12.5)
Platelets: 237 10*3/uL (ref 140–400)
RBC: 4.68 10*6/uL (ref 3.80–5.10)
RDW: 12.7 % (ref 11.0–15.0)
WBC: 4.3 10*3/uL (ref 3.8–10.8)

## 2018-09-01 LAB — HIV-1 RNA QUANT-NO REFLEX-BLD
HIV 1 RNA Quant: 13800 copies/mL — ABNORMAL HIGH
HIV-1 RNA Quant, Log: 4.14 Log copies/mL — ABNORMAL HIGH

## 2018-09-01 LAB — HCG, QUANTITATIVE, PREGNANCY: HCG, Total, QN: 9801 m[IU]/mL

## 2018-09-03 NOTE — Progress Notes (Signed)
Kimberly Bishop

## 2018-09-03 NOTE — Progress Notes (Signed)
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 I haven't talked to her yet, so feel free to give her a call!

## 2018-09-05 ENCOUNTER — Encounter: Payer: Self-pay | Admitting: *Deleted

## 2018-09-05 ENCOUNTER — Ambulatory Visit: Payer: BLUE CROSS/BLUE SHIELD | Admitting: Infectious Diseases

## 2018-09-05 NOTE — Telephone Encounter (Signed)
[facility] Patient Advocate Encounter  HMAP has been approved as of June 2020 and patient will receive her medications from [facility].

## 2018-09-07 ENCOUNTER — Ambulatory Visit: Payer: No Typology Code available for payment source | Admitting: Infectious Diseases

## 2018-09-19 ENCOUNTER — Encounter: Payer: Self-pay | Admitting: *Deleted

## 2018-10-01 ENCOUNTER — Telehealth: Payer: Self-pay | Admitting: Family Medicine

## 2018-10-01 NOTE — Telephone Encounter (Signed)
Spoke with patient about her appointment on 7/21 @ 8:15. Patient instructed that the visit will be a phone visit with the nurses. Patient was given her in office appointment as well ( 8/6 @ 8:35). Patient verbalized understanding.

## 2018-10-02 ENCOUNTER — Ambulatory Visit: Payer: Medicaid Other | Admitting: Infectious Diseases

## 2018-10-02 ENCOUNTER — Other Ambulatory Visit: Payer: Self-pay

## 2018-10-02 ENCOUNTER — Ambulatory Visit (INDEPENDENT_AMBULATORY_CARE_PROVIDER_SITE_OTHER): Payer: Self-pay | Admitting: *Deleted

## 2018-10-02 ENCOUNTER — Ambulatory Visit (INDEPENDENT_AMBULATORY_CARE_PROVIDER_SITE_OTHER): Payer: Self-pay | Admitting: Infectious Diseases

## 2018-10-02 ENCOUNTER — Encounter: Payer: Self-pay | Admitting: Infectious Diseases

## 2018-10-02 VITALS — BP 122/76 | HR 99 | Temp 98.0°F

## 2018-10-02 DIAGNOSIS — B20 Human immunodeficiency virus [HIV] disease: Secondary | ICD-10-CM

## 2018-10-02 DIAGNOSIS — O0990 Supervision of high risk pregnancy, unspecified, unspecified trimester: Secondary | ICD-10-CM

## 2018-10-02 MED ORDER — PROMETHAZINE HCL 25 MG PO TABS: 25.0000 mg | ORAL_TABLET | Freq: Four times a day (QID) | ORAL | 1 refills | Status: DC | PRN

## 2018-10-02 MED ORDER — TIVICAY 50 MG PO TABS: 50.0000 mg | ORAL_TABLET | Freq: Every day | ORAL | 11 refills | Status: DC

## 2018-10-02 NOTE — Progress Notes (Signed)
Name: Kimberly Bishop  DOB: Sep 05, 1990 MRN: [id number] PCP: Patient, No Pcp Per    Patient Active Problem List   Diagnosis Date Noted  . Supervision of high risk pregnancy, antepartum 10/02/2018  . Screening examination for venereal disease 03/28/2017  . Encounter for long-term (current) use of high-risk medication 03/28/2017  . Vaccine counseling 12/01/2016  . Molluscum contagiosum 03/22/2016  . At risk for opportunistic infections 03/22/2016  . Pruritic rash 02/26/2015  . HIV disease ([place]) 07/03/2012  . Perinatal HIV exposure 12/16/2011     Brief Narrative:  Kimberly Bishop  is a 28 y.o. female  with HIV disease. CD4 nadir 90, likely less unrecorded VL she has never been undetectable HIV Risk: Perinatal History of OIs: Intake Labs 2013: Hep B sAg (-), sAb (-), cAb (-); Hep A (-), Hep C (-) Quantiferon () HLA B*5701 (-) G6PD: ()   Previous Regimens: . Tivicay + [place] . Biktarvy  Genotypes: . 05/2018: wildtype virus   Subjective:  CC: HIV follow up care in pregnant female. She is [redacted]w[redacted]d Profound problem with nausea and vomiting.     HPI: Ms. WParkhurstis here today to follow-up on her HIV infection, first trimester pregnancy supervision.  She tells me that she has been may be taking her Isentress and Truvada 3 days a week at most.  She tells me that she has had a tremendous problem with nausea and vomiting and has failed multiple antiemetics given to her by her GYN team.  She got a new antiemetic recently that she has not yet tried (Phenergan).  She feels that her vomiting is best controlled in the evening.  Things that help remedy her vomiting and nausea include fruits especially watermelon.  She is not taking her Bactrim.  She is taking a prenatal vitamin.  Father of the baby is involved, however I am uncertain of his status with regards to HIV.  She is not felt any fetal movement at this point.  [facility]  She is pretty quiet throughout the encounter today but clearly is concerned about perinatal transmission to unborn child.  No outward concerns with uncontrolled anxiety or depression.  Depression screen PHQ 2/9 10/02/2018  Decreased Interest 0  Down, Depressed, Hopeless 0  PHQ - 2 Score 0    Review of Systems  All other systems reviewed and are negative.   Past Medical History:  Diagnosis Date  . AIDS (acquired immunodeficiency syndrome), CD4 <=200 ([place]   . History of kidney stones   . HIV (human immunodeficiency virus infection) ([place]   . Immune deficiency disorder ([facility]     Outpatient Medications Prior to Visit  Medication Sig Dispense Refill  . emtricitabine-tenofovir (TRUVADA) 200-300 MG tablet Take 1 tablet by mouth daily. 30 tablet 11  . Prenatal MV-Min-FA-Omega-3 (PRENATAL GUMMIES/DHA & FA) 0.4-32.5 MG CHEW Chew 2 tablets by mouth daily.    . raltegravir (ISENTRESS) 400 MG tablet Take 1 tablet (400 mg total) by mouth 2 (two) times daily. 60 tablet 11  . promethazine (PHENERGAN) 25 MG tablet Take 1 tablet (25 mg total) by mouth every 6 (six) hours as needed for nausea or vomiting. 30 tablet 1   No facility-administered medications prior to visit.      No Known Allergies  Social History   Tobacco Use  . Smoking status: Never Smoker  . Smokeless tobacco: Never Used  Substance Use Topics  . Alcohol use:  Not Currently    Comment: occasional - last use 08/2018  . Drug use: Yes    Types: Marijuana    Comment: last use 08/24/18    Family History  Adopted: Yes  Problem Relation Age of Onset  . Hypertension Mother   . Autism Brother     Social History   Substance and Sexual Activity  Sexual Activity Yes  . Partners: Male     Objective:   Vitals:   10/02/18 1517  BP: 122/76  Pulse: 99  Temp: 98 F (36.7 C)  TempSrc: Oral  SpO2: 99%   There is no height or weight on file to calculate BMI.  Physical Exam Constitutional:      Comments:  Seated comfortably in the chair in no distress.  HENT:     Mouth/Throat:     Mouth: Mucous membranes are moist.     Pharynx: Oropharynx is clear. No oropharyngeal exudate.  Cardiovascular:     Rate and Rhythm: Normal rate and regular rhythm.  Pulmonary:     Effort: Pulmonary effort is normal.     Breath sounds: Normal breath sounds.  Abdominal:     General: Bowel sounds are normal. There is no distension.  Skin:    Capillary Refill: Capillary refill takes less than 2 seconds.  Neurological:     Mental Status: She is oriented to person, place, and time.  Psychiatric:     Comments: Affect seems flat today.     Lab Results Lab Results  Component Value Date   WBC 4.3 08/27/2018   HGB 14.1 08/27/2018   HCT 42.3 08/27/2018   MCV 90.4 08/27/2018   PLT 237 08/27/2018    Lab Results  Component Value Date   CREATININE 0.85 05/25/2018   BUN 11 05/25/2018   NA 140 05/25/2018   K 4.2 05/25/2018   CL 107 05/25/2018   CO2 26 05/25/2018    Lab Results  Component Value Date   ALT 26 05/25/2018   AST 21 05/25/2018   ALKPHOS 97 11/03/2016   BILITOT 0.3 05/25/2018    Lab Results  Component Value Date   CHOL 177 03/28/2017   HDL 44 (L) 03/28/2017   LDLCALC 119 (H) 03/28/2017   TRIG 58 03/28/2017   CHOLHDL 4.0 03/28/2017   HIV 1 RNA Quant (copies/mL)  Date Value  08/27/2018 13,800 (H)  05/25/2018 6,270 (H)  04/26/2017 1,130 (H)   CD4 T Cell Abs (/uL)  Date Value  10/03/2018 94 (L)  08/27/2018 90 (L)  05/25/2018 110 (L)     Assessment & Plan:   Problem List Items Addressed This Visit      Unprioritized   HIV disease ([place]) - Primary    We spent a majority of the visit today discussing what we can do to help her tolerate her medications and reduce the risk of vertical transmission to the unborn child.  She does express concern about that and would like to prevent it.  Given she is past 9 weeks, we will go ahead and initiate Tivicay and stop her Isentress.  She is to  continue her Truvada. I would like for her to resume her single strength Bactrim once daily when she reaches her second trimester as well.  When our ID pharmacist does a telephone consultation in 2 weeks for adherence check-in we will send in this prescription for her. We will check HIV viral load as well as full genotype given her terrible adherence on the Isentress.  Relevant Medications   dolutegravir (TIVICAY) 50 MG tablet   Other Relevant Orders   HIV-1 RNA quant-no reflex-bld   HIV RNA, RTPCR W/R GT (RTI, PI,INT)   Supervision of high risk pregnancy, antepartum    Currently 11w 0d pregnant dated off LMP. She has had significant trouble with nausea/vomiting and was recently given rx for phenergan.  Given she is nearly in her second trimester we will go ahead and make switch to Tivicay and continue her Truvada.  She will stop her Isentress.  I am greatly concerned that with the 3 day/week adherence in the context of historically poor adherence and now presently her nausea that she may develop further resistance on this regimen.  I would really like to have her on a PI based regimen however she has had Norvir and darunavir in the past and is absolutely refusing this regimen due to previous nausea in the past. The Tivicay will offer a higher barrier to resistance but still given the poor adherence on Isentress over the last few weeks I will also run a genotype off of today's labs.  I was very frank with her and told her that should she continue throughout her pregnancy with uncontrolled HIV virus the risk for vertical transmission will be minimum 50% and recommendations to deliver the baby will be via C-section.  I suggested she begin taking her antiretrovirals at night when her nausea is at best with food that she can most easily tolerate.  She will try this.        I spent 15 minutes with the patient in face-to-face consultation and counseling of all the points detailed above.   Janene Madeira, MSN, NP-C [facility] for Infectious [place] Pager: [phone] Office: [phone]  10/04/18  12:42 PM

## 2018-10-02 NOTE — Patient Instructions (Signed)
Stop taking your Isentress.  We will go ahead and send in your Tivicay (small yellow pill) to take with your Truvada (bigger blue pill).  I want you to start your new nausea medication and plan on taking this 30 to 45 minutes before you give yourself your Tivicay and Truvada dose.  If you have the least amount of nausea at night I want you to try to take your HIV medication at night. If you could try taking this with food that would be good to help reduced vomiting.  I want you to arrange a phone visit with Cassie our pharmacist in 3 weeks to check and see how you are doing.  If you find that you cannot get 6 days a week of medicine and you, I want you to call us to discuss and not wait until your next appointment.

## 2018-10-02 NOTE — Progress Notes (Signed)
I connected with  Kimberly Bishop on 10/02/18 at  8:15 AM EDT by telephone and verified that I am speaking with the correct person using two identifiers.   I discussed the limitations, risks, security and privacy concerns of performing an evaluation and management service by telephone and the availability of in person appointments. I also discussed with the patient that there may be a patient responsible charge related to this service. The patient expressed understanding and agreed to proceed.  New Ob intake completed. Pt advised that her prenatal care will include a combination of virtual, telephonic and face to face visits. Covid visitation restrictions explained. Pt reports she has been having a lot of nausea and vomiting for the past 6 weeks (2 times yesterday). Dietary recommendations given and Rx for Phenergan sent to pharmacy per standing order. She was advised to go to [facility] if she has frequent vomiting and is not able to keep any liquids down. Pt had last Pap 06/23/14 - negative result. She was advised that she will have Pap test during office visit with Dr. Roselie Awkward on 8/6 as well as routine prenatal lab tests. Pt agrees to check BP weekly and record results to [employer] portal. Pt currently has [employer] risk [employer] Medicaid. She will apply for pregnancy Medicaid and once approved, her BP cuff will be ordered and sent to her home. Pt voiced understanding of all information and instructions given.   Kimberly Bishop, Kimberly [place], RN 10/02/2018  1:59 PM

## 2018-10-03 ENCOUNTER — Other Ambulatory Visit: Payer: Medicaid Other

## 2018-10-03 DIAGNOSIS — B20 Human immunodeficiency virus [HIV] disease: Secondary | ICD-10-CM

## 2018-10-03 NOTE — Assessment & Plan Note (Addendum)
Currently 11w 0d pregnant dated off LMP. She has had significant trouble with nausea/vomiting and was recently given rx for phenergan.  Given she is nearly in her second trimester we will go ahead and make switch to Tivicay and continue her Truvada.  She will stop her Isentress.  I am greatly concerned that with the 3 day/week adherence in the context of historically poor adherence and now presently her nausea that she may develop further resistance on this regimen.  I would really like to have her on a PI based regimen however she has had Norvir and darunavir in the past and is absolutely refusing this regimen due to previous nausea in the past. The Tivicay will offer a higher barrier to resistance but still given the poor adherence on Isentress over the last few weeks I will also run a genotype off of today's labs.  I was very frank with her and told her that should she continue throughout her pregnancy with uncontrolled HIV virus the risk for vertical transmission will be minimum 50% and recommendations to deliver the baby will be via C-section.  I suggested she begin taking her antiretrovirals at night when her nausea is at best with food that she can most easily tolerate.  She will try this.

## 2018-10-04 LAB — T-HELPER CELL (CD4) - (RCID CLINIC ONLY)
CD4 % Helper T Cell: 8 % — ABNORMAL LOW (ref 33–65)
CD4 T Cell Abs: 94 /uL — ABNORMAL LOW (ref 400–1790)

## 2018-10-04 NOTE — Assessment & Plan Note (Signed)
We spent a majority of the visit today discussing what we can do to help her tolerate her medications and reduce the risk of vertical transmission to the unborn child.  She does express concern about that and would like to prevent it.  Given she is past 9 weeks, we will go ahead and initiate Tivicay and stop her Isentress.  She is to continue her Truvada. I would like for her to resume her single strength Bactrim once daily when she reaches her second trimester as well.  When our ID pharmacist does a telephone consultation in 2 weeks for adherence check-in we will send in this prescription for her. We will check HIV viral load as well as full genotype given her terrible adherence on the Isentress.

## 2018-10-05 LAB — HIV-1 RNA QUANT-NO REFLEX-BLD
HIV 1 RNA Quant: 10400 copies/mL — ABNORMAL HIGH
HIV-1 RNA Quant, Log: 4.02 Log copies/mL — ABNORMAL HIGH

## 2018-10-08 ENCOUNTER — Telehealth: Payer: Self-pay | Admitting: Infectious Diseases

## 2018-10-08 NOTE — Telephone Encounter (Signed)
I called Kimberly Bishop today to go over her most recent lab results after verifying her name and date of birth.  Her viral load is still elevated at above 10,000 copies in the setting of known poor adherence with her Isentress plus Truvada due to uncontrolled vomiting.  She reports much better control of her nausea and vomiting with Phenergan.  She has picked up her Tivicay and is taking this correctly with her Truvada once daily.  Reports much better tolerance.  I scheduled her a office visit in 3 weeks to repeat her viral load.  She will be into her second trimester at that point and would like to resume single strength Bactrim for OI prophylaxis daily at that time.  She had no further questions.

## 2018-10-10 NOTE — Progress Notes (Signed)
I have reviewed this chart and agree with the RN/CMA assessment and management.    Derelle Cockrell C Girlie Veltri, MD, FACOG Attending Physician, [facility]  

## 2018-10-13 IMAGING — DX DG CHEST 2V
2 series · 2 of 2 positions shown · non-contrast
Comparison: 02/07/2015

CLINICAL DATA: Preop for kidney stone surgery.

EXAM:
CHEST  2 VIEW

[chest pa]
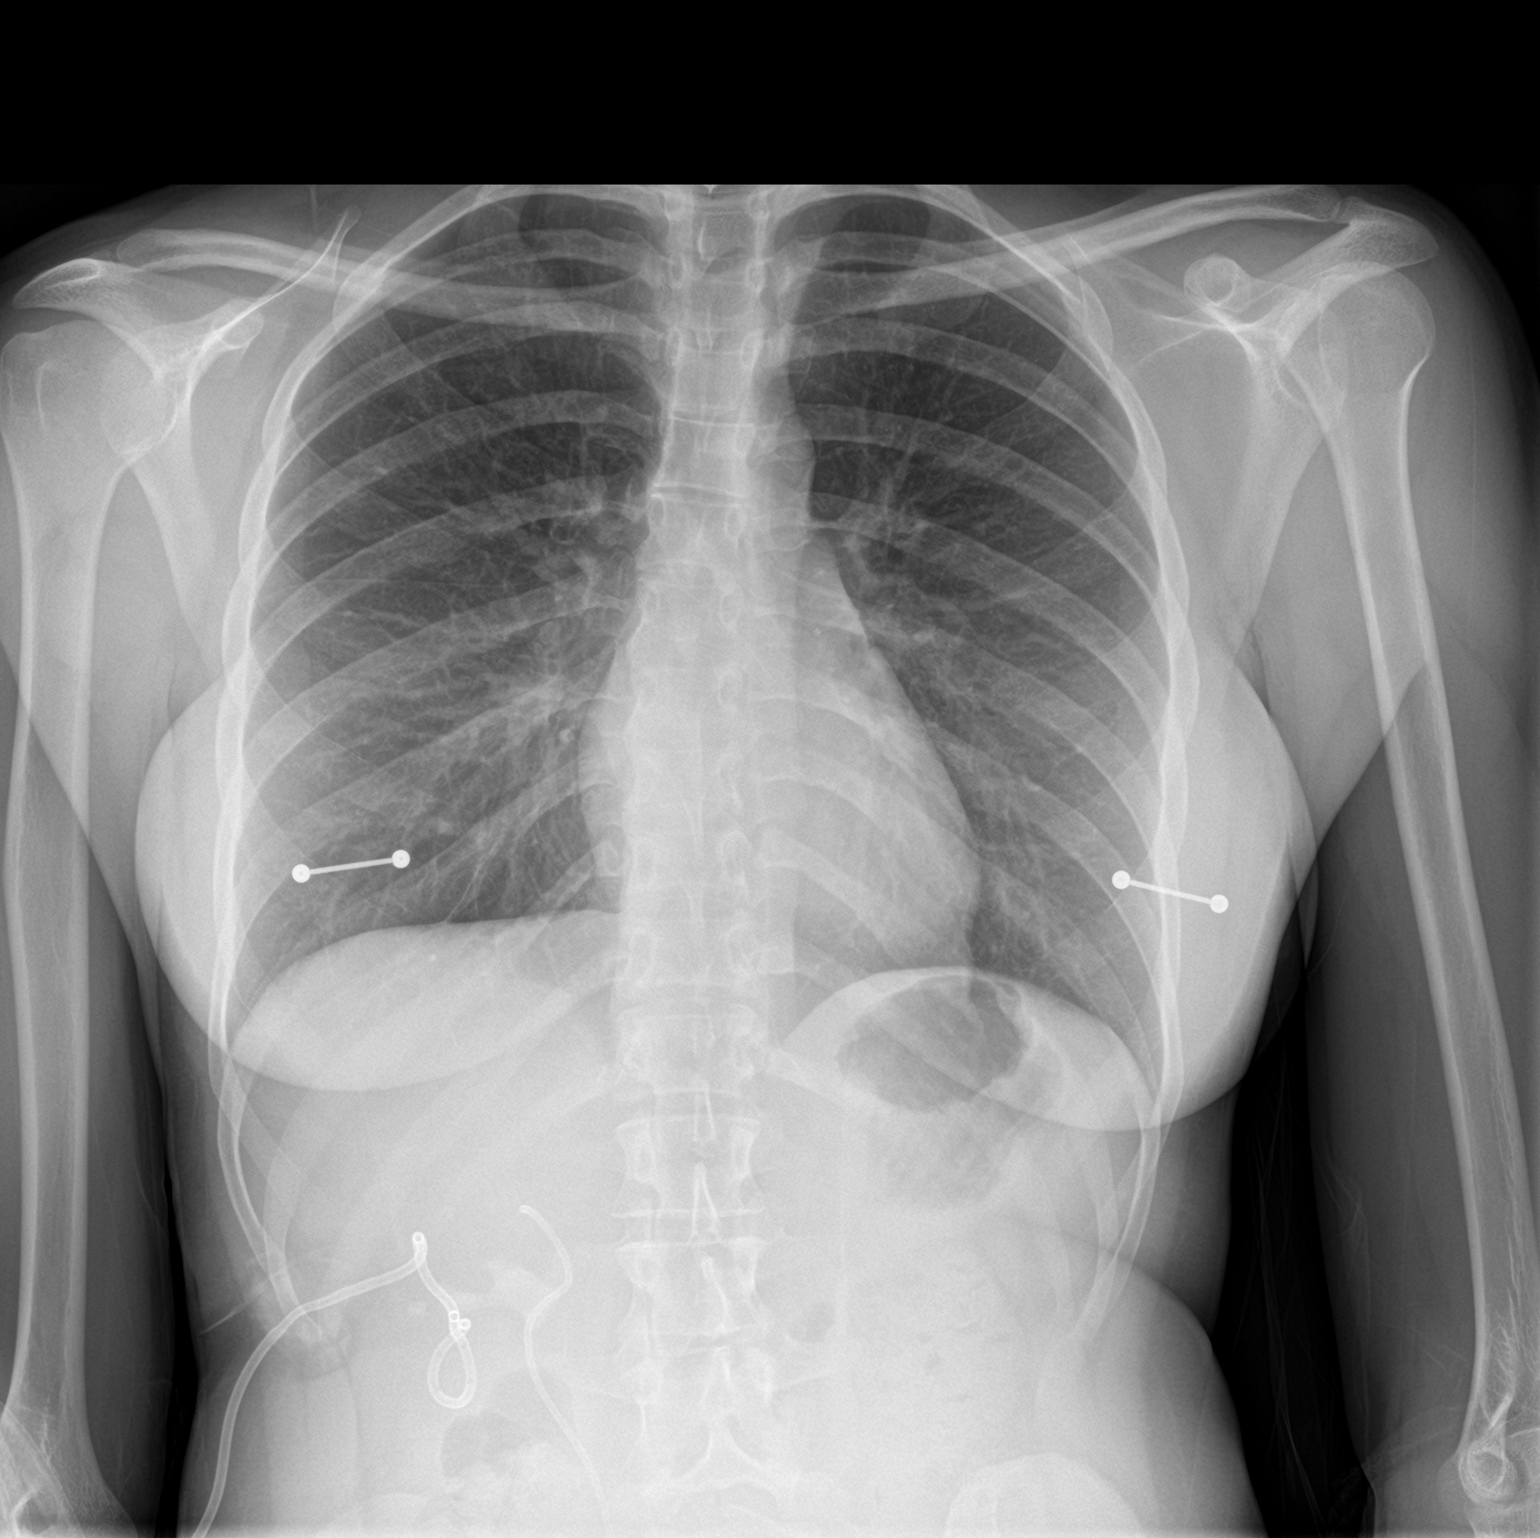

[chest lat]
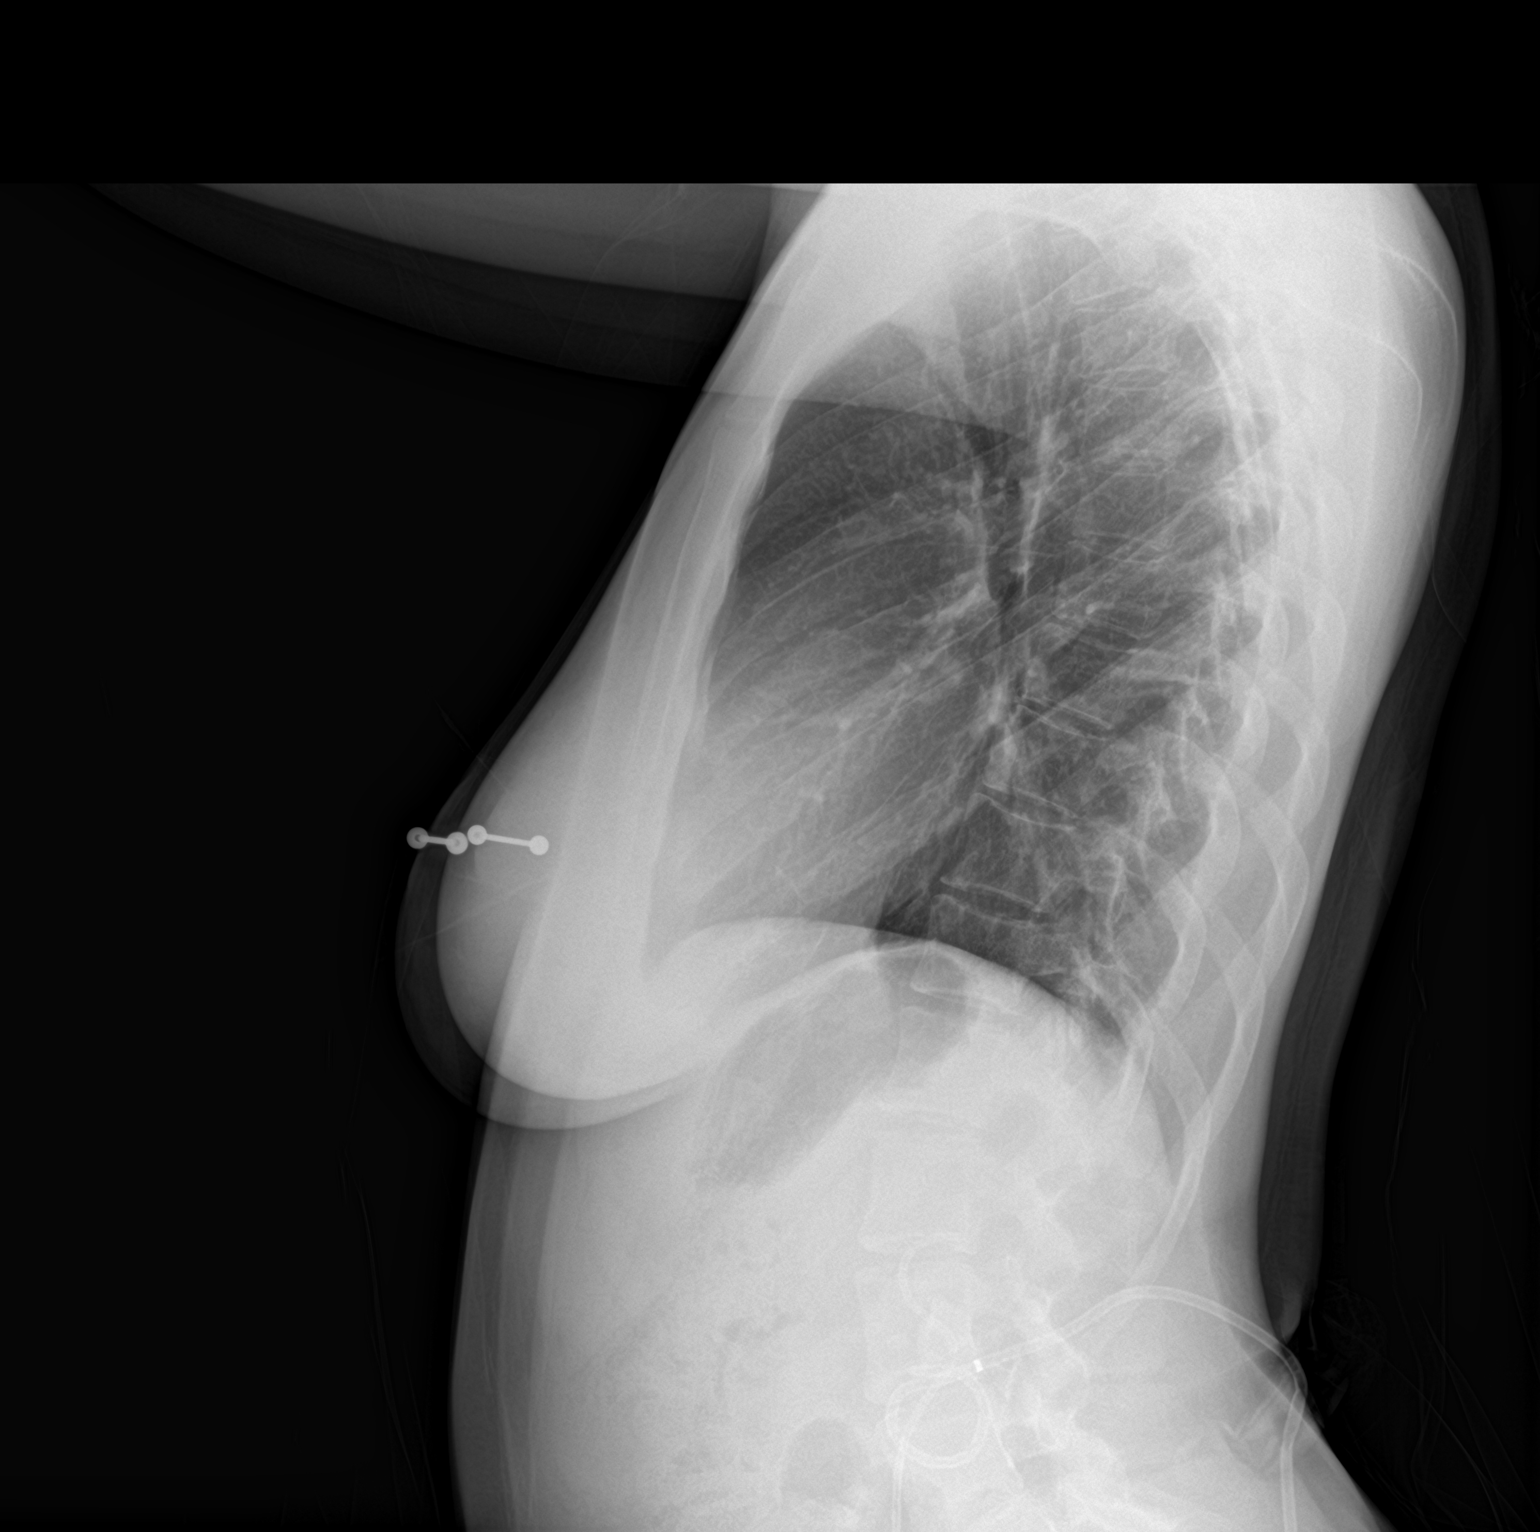

[2 of 2 positions shown; findings below may reference images not displayed]

FINDINGS: Both lungs are clear. Heart and mediastinum are within normal
limits. Trachea is midline. Patient has a right staghorn renal
calculi with a right ureter stent and right percutaneous nephrostomy
tube. No pleural effusions.
IMPRESSION: No active cardiopulmonary disease.

Right kidney findings as described.

## 2018-10-17 ENCOUNTER — Telehealth: Payer: Self-pay | Admitting: Obstetrics & Gynecology

## 2018-10-17 NOTE — Telephone Encounter (Signed)
Called the patient to inform of upcoming appointment. The patient answered no to the covid19 screening questions. Also informed of our new office location and no visitors or children due to covid19 restrictions. The patient verbalized understanding.

## 2018-10-18 ENCOUNTER — Ambulatory Visit (INDEPENDENT_AMBULATORY_CARE_PROVIDER_SITE_OTHER): Payer: Medicaid Other | Admitting: Obstetrics & Gynecology

## 2018-10-18 ENCOUNTER — Encounter: Payer: Self-pay | Admitting: Obstetrics & Gynecology

## 2018-10-18 ENCOUNTER — Other Ambulatory Visit: Payer: Self-pay

## 2018-10-18 ENCOUNTER — Other Ambulatory Visit (HOSPITAL_COMMUNITY)
Admission: RE | Admit: 2018-10-18 | Discharge: 2018-10-18 | Disposition: A | Discharge status: Home / Self Care | Payer: Medicaid Other | Source: Ambulatory Visit | Attending: Obstetrics & Gynecology | Admitting: Obstetrics & Gynecology

## 2018-10-18 DIAGNOSIS — O0991 Supervision of high risk pregnancy, unspecified, first trimester: Secondary | ICD-10-CM

## 2018-10-18 DIAGNOSIS — O0990 Supervision of high risk pregnancy, unspecified, unspecified trimester: Secondary | ICD-10-CM

## 2018-10-18 DIAGNOSIS — Z3A13 13 weeks gestation of pregnancy: Secondary | ICD-10-CM

## 2018-10-18 IMAGING — CT CT RENAL STONE PROTOCOL
2 of 4 series · 15 of 46 positions shown, 17 images · non-contrast
Comparison: CT the abdomen and pelvis 11/04/2016.

CLINICAL DATA: 26-year-old female with history urinary tract
calculi status post right-sided ureteral stent on 11/03/2016,
followed by right-sided nephrostomy tube placement on 11/07/2016,
followed by right ureteral dilatation and percutaneous
nephrolithotomy on 12/05/2016. Evaluate for residual calculus.

EXAM:
CT ABDOMEN AND PELVIS WITHOUT CONTRAST
TECHNIQUE: Multidetector CT imaging of the abdomen and pelvis was performed
following the standard protocol without IV contrast.

[Series 2: axial st · axial · 0.68mm/px · z∈[+1208,+1508]mm · 12 of 68 slices shown, 14 images]
[im 4/68  soft-tissue]
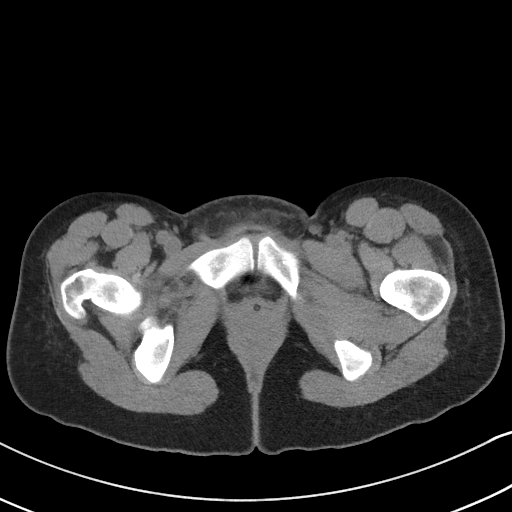
[im 4/68  bone]
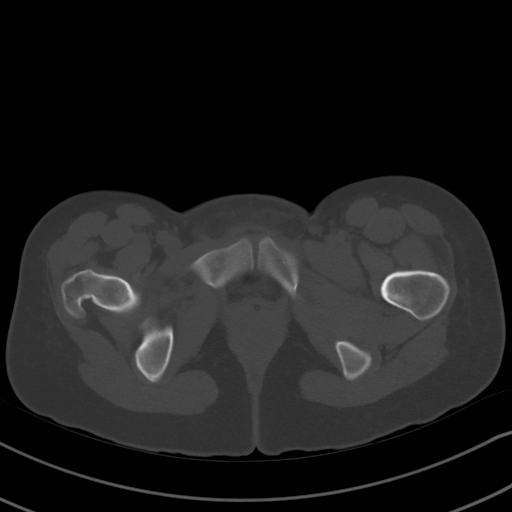
[im 10/68  soft-tissue]
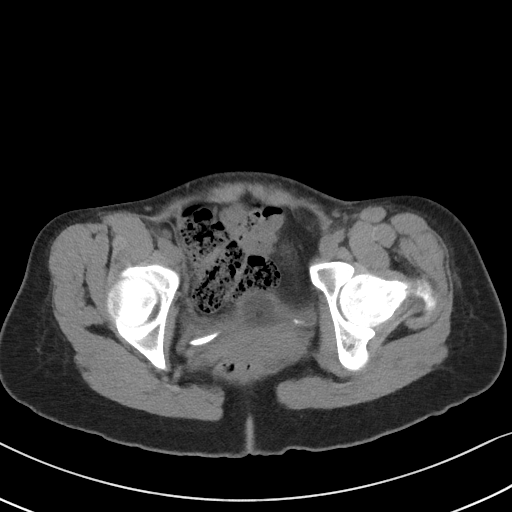
[im 16/68  soft-tissue]
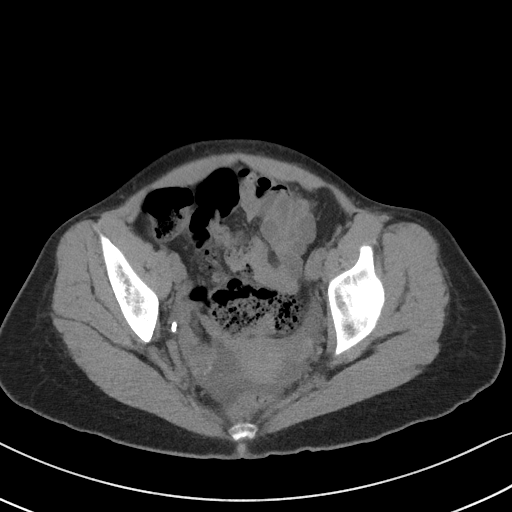
[im 22/68  soft-tissue]
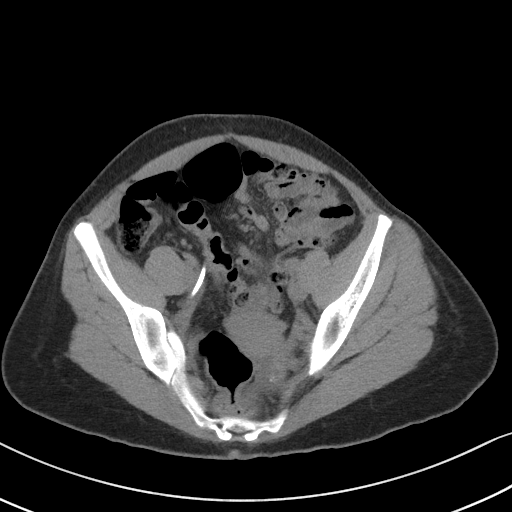
[im 25/68  soft-tissue]
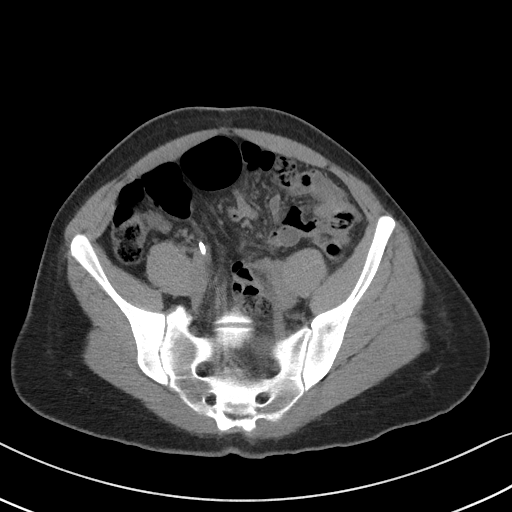
[im 31/68  soft-tissue]
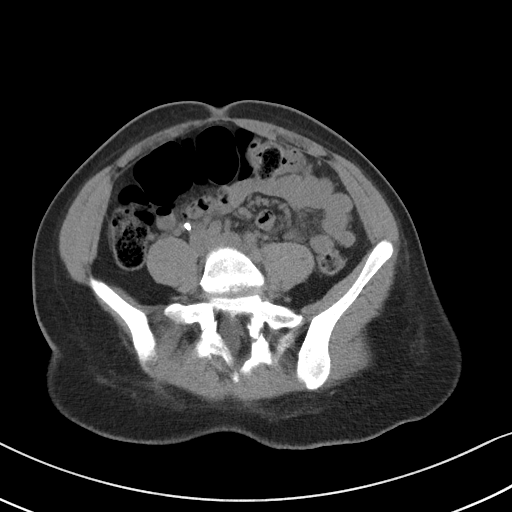
[im 37/68  soft-tissue]
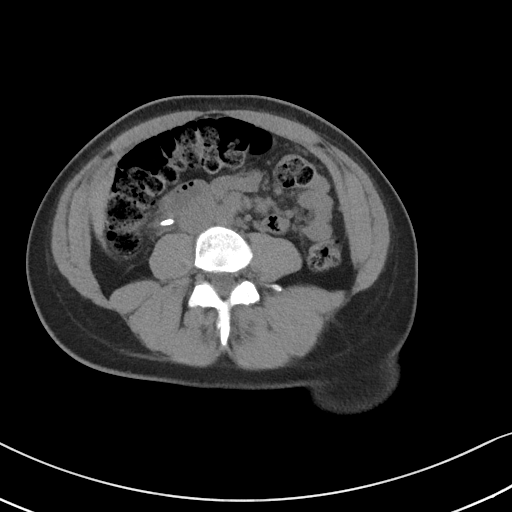
[im 43/68  soft-tissue]
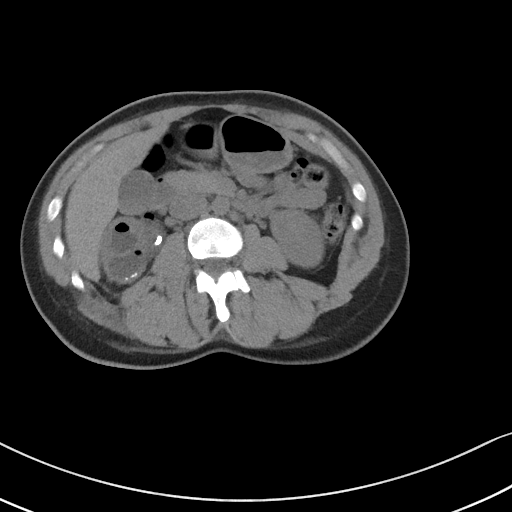
[im 46/68  soft-tissue]
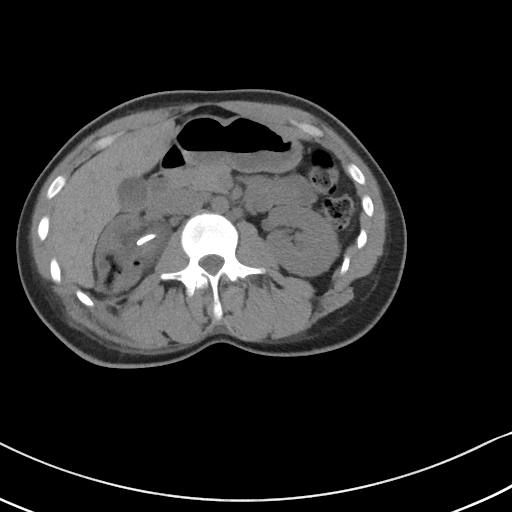
[im 46/68  bone]
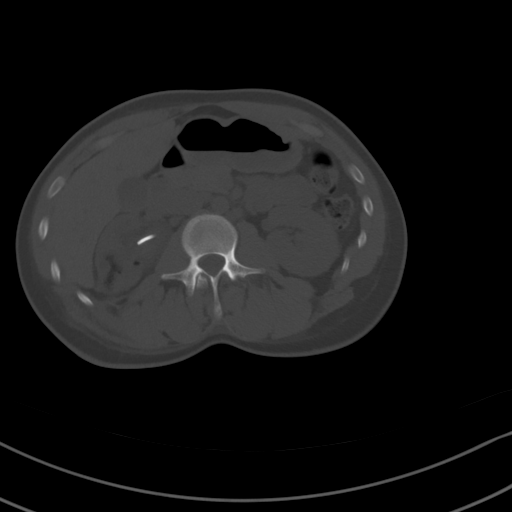
[im 52/68  soft-tissue]
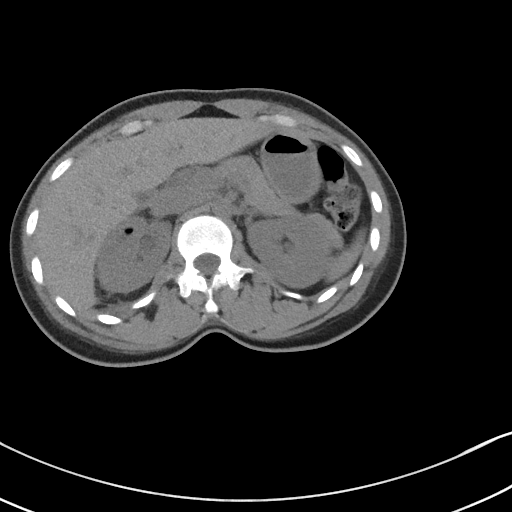
[im 58/68  soft-tissue]
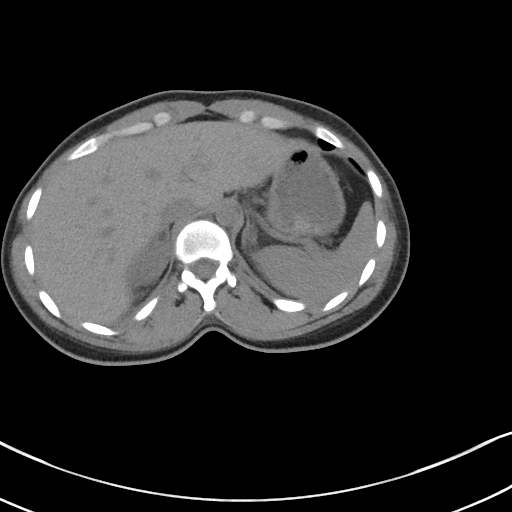
[im 64/68  soft-tissue]
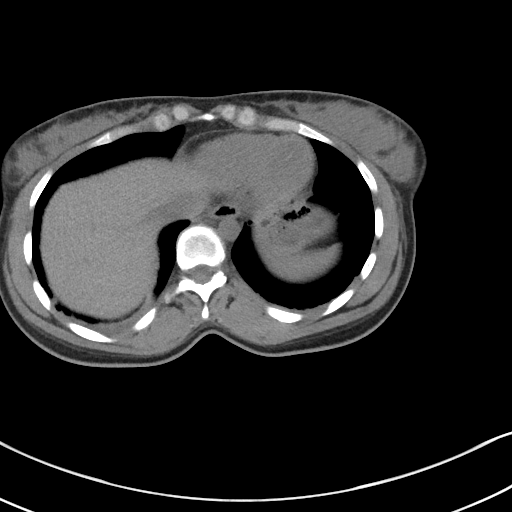

[Series 4: coronal · coronal · 0.59mm/px · 3 of 123 slices shown]
[im 41/123  soft-tissue]
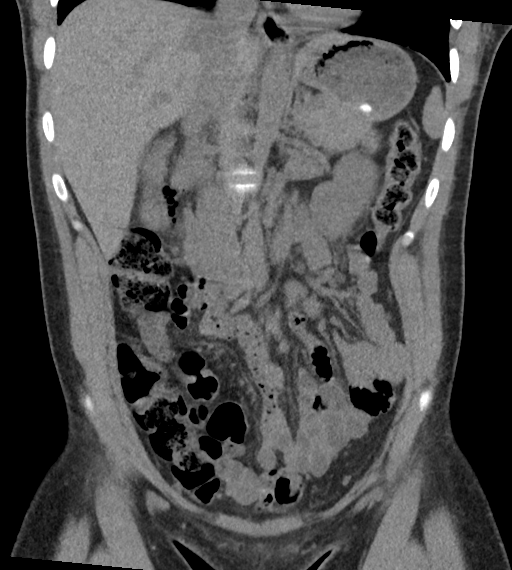
[im 55/123  soft-tissue]
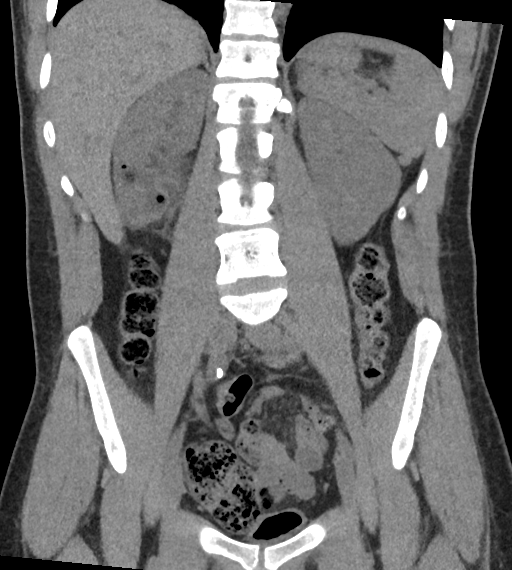
[im 68/123  soft-tissue]
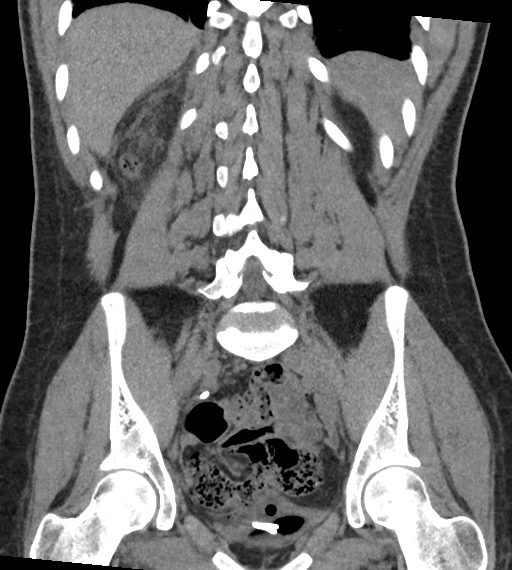

[15 of 46 positions shown; findings below may reference images not displayed]

FINDINGS: Lower chest: Subsegmental atelectasis in the dependent portion of
the right lower lobe.

Hepatobiliary: No definite cystic or solid hepatic lesions are
identified on today's noncontrast CT examination. Unenhanced
appearance of the gallbladder is unremarkable.

Pancreas: No definite pancreatic mass or peripancreatic inflammatory
changes are noted on today's noncontrast CT examination.

Spleen: Unremarkable.

Adrenals/Urinary Tract: Again noted is a right-sided double-J
ureteral stent with the proximal loop reformed in the right renal
pelvis in the distal loop reformed in the urinary bladder. There
continue to be several stones and/or stone fragments in the right
renal collecting system. The largest of these is in the interpolar
collecting system measuring 8 mm. Several smaller apparent stone
fragments are also noted in the lower pole collecting system.
Chronic dilatation of the lower pole collecting system is noted,
with overlying cortical thinning indicative of chronic scarring.
There is gas present within the right renal collecting system and in
the lumen of the urinary bladder, presumably iatrogenic related to
the indwelling Foley catheter. Left kidney is normal in appearance.
Other than gas in the urinary bladder, urinary bladder is
unremarkable in appearance. Bilateral adrenal glands are normal in
appearance.

Stomach/Bowel: The unenhanced appearance of the stomach is normal.
There is no pathologic dilatation of small bowel or colon. The
appendix is not confidently identified and may be surgically absent.
Regardless, there are no inflammatory changes noted adjacent to the
cecum to suggest the presence of an acute appendicitis at this time.

Vascular/Lymphatic: No atherosclerotic calcifications or definite
aneurysm identified in the abdominal or pelvic vasculature. No
lymphadenopathy noted in the abdomen or pelvis. Several prominent
borderline enlarged retroperitoneal lymph nodes are noted,
nonspecific but presumably reactive.

Reproductive: Uterus and ovaries are unremarkable in appearance.

Other: Small volume of free fluid in the cul-de-sac, presumably
physiologic in this young female patient. No pneumoperitoneum.

Musculoskeletal: There are no aggressive appearing lytic or blastic
lesions noted in the visualized portions of the skeleton.
IMPRESSION: 1. 8 mm nonobstructive calculus in the interpolar collecting system
of the right kidney is unchanged. There continue to be several small
stone fragments in the lower pole collecting system of the right
kidney, although the number of these fragments has decreased
compared to the prior study. The collecting system of the lower pole
of the right kidney is chronically dilated, with overlying cortical
thinning presumably post infectious scarring. Right-sided double-J
ureteral stent appears appropriately positioned.
2. Additional incidental findings, as above.

## 2018-10-18 MED ORDER — BLOOD PRESSURE MONITORING DEVI: 1.0000 | 0 refills | Status: DC

## 2018-10-18 NOTE — Progress Notes (Signed)
Subjective:    Kimberly Bishop is a G2P0010 [redacted]w[redacted]d being seen today for her first obstetrical visit.  Her obstetrical history is significant for HIV positive on medication . Patient does not intend to breast feed. Pregnancy history fully reviewed.  Patient reports nausea.  Vitals:   10/18/18 0851  BP: 128/74  Pulse: (!) 102  Temp: 98.6 F (37 C)  Weight: 121 lb (54.9 kg)    HISTORY: OB History  Gravida Para Term Preterm AB Living  2 0 0 0 1 0  SAB TAB Ectopic Multiple Live Births  1 0 0 0      # Outcome Date GA Lbr Len/2nd Weight Sex Delivery Anes PTL Lv  2 Current           1 SAB 01/02/13 [redacted]w[redacted]d          Past Medical History:  Diagnosis Date  . 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 History of kidney stones   . HIV (human immunodeficiency virus infection) ([place])   . Immune deficiency disorder [facility])    Past Surgical History:  Procedure Laterality Date  . APPENDECTOMY  2011  . CYSTOSCOPY W/ URETERAL STENT PLACEMENT Right 11/03/2016   Procedure: CYSTOSCOPY WITH RETROGRADE PYELOGRAM/URETERAL STENT PLACEMENT;  Surgeon: Kathie Rhodes, MD;  Location: [facility];  Service: Urology;  Laterality: Right;  . CYSTOSCOPY/URETEROSCOPY/HOLMIUM LASER/STENT PLACEMENT Right 01/16/2017   Procedure: CYSTOSCOPY/URETEROSCOPY/ STONE EXTRACTION/STENT removal;  Surgeon: Kathie Rhodes, MD;  Location: [facility];  Service: Urology;  Laterality: Right;  . IR DIL URETER RIGHT  12/05/2016  . IR NEPHROSTOMY PLACEMENT RIGHT  11/07/2016  . IR URETERAL STENT PLACEMENT EXISTING ACCESS RIGHT  12/05/2016  . NEPHROLITHOTOMY Right 12/05/2016   Procedure: NEPHROLITHOTOMY PERCUTANEOUS;  Surgeon: Kathie Rhodes, MD;  Location: [facility];  Service: Urology;  Laterality: Right;   Family History  Adopted: Yes  Problem Relation Age of Onset  . Hypertension Mother   . Autism Brother      Exam    Uterus:     Pelvic Exam:    Perineum: No Hemorrhoids   Vulva: normal   Vagina:  normal mucosa   pH:      Cervix: no lesions   Adnexa: normal adnexa   Bony Pelvis: average  System: Breast:  Inspection negative   Skin: normal coloration and turgor, no rashes    Neurologic: oriented, normal mood   Extremities: normal strength, tone, and muscle mass   HEENT neck supple with midline trachea and thyroid without masses   Mouth/Teeth mucous membranes moist, pharynx normal without lesions and dental hygiene good   Neck supple   Cardiovascular: regular rate and rhythm, no murmurs or gallops   Respiratory:  appears well, vitals normal, no respiratory distress, acyanotic, normal RR, neck free of mass or lymphadenopathy, chest clear, no wheezing, crepitations, rhonchi, normal symmetric air entry   Abdomen: soft, non-tender; bowel sounds normal; no masses,  no organomegaly   Urinary: urethral meatus normal      Assessment:    Pregnancy: G2P0010 Patient Active Problem List   Diagnosis Date Noted  . Supervision of high risk pregnancy, antepartum 10/02/2018  . Screening examination for venereal disease 03/28/2017  . Encounter for long-term (current) use of high-risk medication 03/28/2017  . Vaccine counseling 12/01/2016  . Molluscum contagiosum 03/22/2016  . At risk for opportunistic infections 03/22/2016  . Pruritic rash 02/26/2015  . HIV affecting pregnancy, antepartum 07/03/2012  . Perinatal HIV exposure 12/16/2011  Plan:     Initial labs drawn. Prenatal vitamins. Problem list reviewed and updated. Genetic Screening discussed Panorama ordered Ultrasound discussed; fetal survey: ordered.  Follow up in 7 weeks. 50% of 30 min visit spent on counseling and coordination of care.  Babyscripts instructed   Kimberly Bishop 10/18/2018

## 2018-10-18 NOTE — Patient Instructions (Signed)

## 2018-10-18 NOTE — Progress Notes (Signed)
Medicaid BP Cuff ordered thru [facility].-10/18/2018

## 2018-10-19 LAB — OBSTETRIC PANEL, INCLUDING HIV
Antibody Screen: NEGATIVE
Basophils Absolute: 0 10*3/uL (ref 0.0–0.2)
Basos: 0 %
EOS (ABSOLUTE): 0.1 10*3/uL (ref 0.0–0.4)
Eos: 2 %
HIV Screen 4th Generation wRfx: REACTIVE — AB
Hematocrit: 36.1 % (ref 34.0–46.6)
Hemoglobin: 12 g/dL (ref 11.1–15.9)
Hepatitis B Surface Ag: NEGATIVE
Immature Grans (Abs): 0 10*3/uL (ref 0.0–0.1)
Immature Granulocytes: 0 %
Lymphocytes Absolute: 0.7 10*3/uL (ref 0.7–3.1)
Lymphs: 10 %
MCH: 30 pg (ref 26.6–33.0)
MCHC: 33.2 g/dL (ref 31.5–35.7)
MCV: 90 fL (ref 79–97)
Monocytes Absolute: 0.5 10*3/uL (ref 0.1–0.9)
Monocytes: 7 %
Neutrophils Absolute: 5.4 10*3/uL (ref 1.4–7.0)
Neutrophils: 81 %
Platelets: 190 10*3/uL (ref 150–450)
RBC: 4 x10E6/uL (ref 3.77–5.28)
RDW: 12.7 % (ref 11.7–15.4)
RPR Ser Ql: NONREACTIVE
Rh Factor: POSITIVE
Rubella Antibodies, IGG: <0.9 index — ABNORMAL LOW (ref >0.99)
WBC: 6.8 10*3/uL (ref 3.4–10.8)

## 2018-10-19 LAB — HIV 1/2 AB DIFFERENTIATION
HIV 1 Ab: POSITIVE — AB
HIV 2 Ab: NEGATIVE
NOTE (HIV CONF MULTIP: POSITIVE

## 2018-10-20 LAB — URINE CULTURE, OB REFLEX

## 2018-10-20 LAB — CULTURE, OB URINE

## 2018-10-22 ENCOUNTER — Encounter: Payer: Self-pay | Admitting: *Deleted

## 2018-10-24 ENCOUNTER — Telehealth: Payer: Self-pay | Admitting: *Deleted

## 2018-10-24 LAB — CYTOLOGY - PAP
Chlamydia: NEGATIVE
Diagnosis: UNDETERMINED — AB
HPV: DETECTED — AB
Neisseria Gonorrhea: NEGATIVE

## 2018-10-24 NOTE — Telephone Encounter (Signed)
I called Kimberly Bishop and informed her of her pap smear results and recommendation to have a colposcopy. I answered her questions and explained a registrar will call her with an appointment and the appointment might be a month or so away due to not many appointments. I asked her to call us back if she does not get a call. She voices understanding.  Lurline Caver,RN

## 2018-10-24 NOTE — Telephone Encounter (Signed)
-----   Message from Woodroe Mode, MD sent at 10/24/2018  3:47 PM EDT ----- ASCUS pos HR HPV, please schedule colposcopy

## 2018-10-26 LAB — HIV-1 INTEGRASE GENOTYPE

## 2018-10-26 LAB — HIV RNA, RTPCR W/R GT (RTI, PI,INT)
HIV 1 RNA Quant: 9960 copies/mL — ABNORMAL HIGH
HIV-1 RNA Quant, Log: 4 Log copies/mL — ABNORMAL HIGH

## 2018-10-26 LAB — HIV-1 GENOTYPE: HIV-1 Genotype: DETECTED — AB

## 2018-10-31 ENCOUNTER — Ambulatory Visit: Payer: Medicaid Other | Admitting: Pharmacist

## 2018-11-13 ENCOUNTER — Telehealth: Payer: Self-pay | Admitting: Pharmacist

## 2018-11-13 NOTE — Telephone Encounter (Signed)
Called patient to see how she was doing on her medication and to reschedule her missed appointment with me. No answer, left HIPAA compliant voicemail.

## 2018-11-21 NOTE — Telephone Encounter (Signed)
When you have time this week can you try to get her on either my or Cassie's schedule for a follow up? Pregnant and uncontrolled HIV, AIDS+. Would also offer Ambre assistance to her for adherence if she is willing to accept her help.  Thank you.

## 2018-11-22 ENCOUNTER — Telehealth: Payer: Self-pay | Admitting: *Deleted

## 2018-11-22 NOTE — Telephone Encounter (Signed)
I was unable to reach her, but will continue to try.  I left a voicemail asking her to call her doctor's office. Will send Mychart message too, in case I am able to reach her that way.

## 2018-11-22 NOTE — Telephone Encounter (Signed)
RN left message at [facility] request, asking patient to call her doctor's office. WIll send mychart message as well. Landis Gandy, RN

## 2018-11-26 ENCOUNTER — Ambulatory Visit (INDEPENDENT_AMBULATORY_CARE_PROVIDER_SITE_OTHER): Payer: Medicaid Other | Admitting: Infectious Diseases

## 2018-11-26 ENCOUNTER — Encounter: Payer: Self-pay | Admitting: Infectious Diseases

## 2018-11-26 ENCOUNTER — Other Ambulatory Visit: Payer: Self-pay

## 2018-11-26 VITALS — BP 121/76 | HR 90

## 2018-11-26 DIAGNOSIS — B20 Human immunodeficiency virus [HIV] disease: Secondary | ICD-10-CM | POA: Diagnosis not present

## 2018-11-26 DIAGNOSIS — R87610 Atypical squamous cells of undetermined significance on cytologic smear of cervix (ASC-US): Secondary | ICD-10-CM | POA: Diagnosis not present

## 2018-11-26 DIAGNOSIS — Z7189 Other specified counseling: Secondary | ICD-10-CM

## 2018-11-26 DIAGNOSIS — R87810 Cervical high risk human papillomavirus (HPV) DNA test positive: Secondary | ICD-10-CM | POA: Diagnosis not present

## 2018-11-26 DIAGNOSIS — O98719 Human immunodeficiency virus [HIV] disease complicating pregnancy, unspecified trimester: Secondary | ICD-10-CM

## 2018-11-26 DIAGNOSIS — Z7185 Encounter for immunization safety counseling: Secondary | ICD-10-CM

## 2018-11-26 NOTE — Assessment & Plan Note (Addendum)
In care/managment with her GYN team; has a colposcopy scheduled tomorrow. We spent time discussing what this procedure is and why she needs it. Will at minimum need repeat pap in 6 months (February 2021).

## 2018-11-26 NOTE — Assessment & Plan Note (Signed)
Counseled on recommendation to receive flu shot while pregnant. She will talk with her OB team; hesitant to accept anything that is "not absolutely necessary" being this is her first pregnancy.

## 2018-11-26 NOTE — Patient Instructions (Signed)
Wonderful to see you today.  Will see what your blood work looks like today. I need you to try to find the best way that you can get your medication in 7 days a week.   Tips for Successful Daily Medication Habits: 1. Set a reminder on your phone  2. Try filling out a pill box for the week - pick a day and put one pill for every day during the week so you know right away if you missed a pill.  3. Have a trusted family member ask you about your medications.  4. Smartphone app   Remember every pill is precious and we want to be able to continue treating your with confidence the rest of your life.    Will see you back in 2 months to check in again. Would also encourage you to consider taking the flu shot.

## 2018-11-26 NOTE — Assessment & Plan Note (Addendum)
Counseled again on the importance of getting 100% of her doses in every week not only to reduce viral load to decrease risk for vertical transmission to her daughter in utero. We discussed pill tray to accurately understand how many doses she is really getting in. Also recommended asking a trusted loved one/family member to help hold her accountable for taking her medications (call or text). Pill alarm reminder as well I think will be helpful for her--I actually encouraged her to set 2 alarms.  Will check viral load today. CD4 count remains low - being she is pregnant I do not suspect that this will reconstitute normally and prepared her for this to continue to be low throughout her pregnancy. She can help reduce the risk of opportunistic infections, however by being virologically suppressed through taking her Tivicay + Truvada daily.   If she has a viral load that is unchanged will refer to home health nurse, Ambre for adherence support.

## 2018-11-26 NOTE — Progress Notes (Signed)
Name: JALEENA VIVIANI  DOB: 1990/11/15 MRN: [id number] PCP: Patient, No Pcp Per  OB/GYN: [name], MD  Patient Active Problem List   Diagnosis Date Noted  . ASCUS with positive high risk HPV cervical 11/26/2018  . Supervision of high risk pregnancy, antepartum 10/02/2018  . Screening examination for venereal disease 03/28/2017  . Encounter for long-term (current) use of high-risk medication 03/28/2017  . Vaccine counseling 12/01/2016  . Molluscum contagiosum 03/22/2016  . At risk for opportunistic infections 03/22/2016  . HIV affecting pregnancy, antepartum 07/03/2012  . Perinatal HIV exposure 12/16/2011     Brief Narrative:  Kimberly Bishop  is a 28 y.o. female  with HIV disease. CD4 nadir 90, likely less unrecorded VL she has never been undetectable HIV Risk: Perinatal History of OIs: Intake Labs 2013: Hep B sAg (-), sAb (-), cAb (-); Hep A (-), Hep C (-) Quantiferon () HLA B*5701 (-) G6PD: ()   Previous Regimens: . Tivicay + Odefsey . Biktarvy . Tivicay + Truvada (pregnancy 10-2018)  Genotypes: . 05/2018: wildtype virus   Subjective:  CC: HIV follow up care in pregnant female. Currently in second trimester, [redacted]w[redacted]d  EDD February 9th, 2021. She is having a daughter, named Dream.     HPI: She has had a significant improvement in her nausea and vomiting now that she is in the second trimester. She has for the last 4 weeks now been getting on average 4 doses of her HIV medication in per week. She states that it makes her "feel funny" which is why she takes it at night after dinner. She feels as if she needs to take it with food because that is how she has always taken her medicines in the past. She cites the reason for not having 100% adherence to be related to forgetting her medication.   She had a pap smear in July with OB/GYN team - read as ASCUS with +HPV. She is due to have a colposcopy tomorrow 11/27/18. Recently treated with antibiotic for +urine  cultures (Ecoli). Reports +fetal movement.   Depression screen PHQ 2/9 11/26/2018  Decreased Interest 0  Down, Depressed, Hopeless 0  PHQ - 2 Score 0    Review of Systems  All other systems reviewed and are negative.   Past Medical History:  Diagnosis Date  . 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 History of kidney stones   . [place]   . Immune deficiency disorder ([facility]     Outpatient Medications Prior to Visit  Medication Sig Dispense Refill  . Blood Pressure Monitoring DEVI 1 Device by Does not apply route once a week. ICD 10: O09.90 1 Device 0  . dolutegravir (TIVICAY) 50 MG tablet Take 1 tablet (50 mg total) by mouth daily. 30 tablet 11  . emtricitabine-tenofovir (TRUVADA) 200-300 MG tablet Take 1 tablet by mouth daily. 30 tablet 11  . Prenatal MV-Min-FA-Omega-3 (PRENATAL GUMMIES/DHA & FA) 0.4-32.5 MG CHEW Chew 2 tablets by mouth daily.    . promethazine (PHENERGAN) 25 MG tablet Take 1 tablet (25 mg total) by mouth every 6 (six) hours as needed for nausea or vomiting. 30 tablet 1   No facility-administered medications prior to visit.      No Known Allergies  Social History   Tobacco Use  . Smoking status: Never Smoker  . Smokeless tobacco: Never Used  Substance Use Topics  . Alcohol use: Not Currently    Comment: occasional - last  use 08/2018  . Drug use: Yes    Types: Marijuana    Comment: last use 08/24/18    Family History  Adopted: Yes  Problem Relation Age of Onset  . Hypertension Mother   . Autism Brother     Social History   Substance and Sexual Activity  Sexual Activity Yes  . Partners: Male  . Birth control/protection: None     Objective:   Vitals:   11/26/18 1127  BP: 121/76  Pulse: 90   There is no height or weight on file to calculate BMI.  Physical Exam Constitutional:      Comments: Seated comfortably in the chair in no distress.  HENT:     Mouth/Throat:      Mouth: Mucous membranes are moist.     Pharynx: Oropharynx is clear. No oropharyngeal exudate.  Cardiovascular:     Rate and Rhythm: Normal rate and regular rhythm.  Pulmonary:     Effort: Pulmonary effort is normal.     Breath sounds: Normal breath sounds.  Abdominal:     Comments: Pregnant  Skin:    Capillary Refill: Capillary refill takes less than 2 seconds.  Neurological:     Mental Status: She is oriented to person, place, and time.  Psychiatric:     Comments: Affect seems flat today.     Lab Results Lab Results  Component Value Date   WBC 6.8 10/18/2018   HGB 12.0 10/18/2018   HCT 36.1 10/18/2018   MCV 90 10/18/2018   PLT 190 10/18/2018    Lab Results  Component Value Date   CREATININE 0.85 05/25/2018   BUN 11 05/25/2018   NA 140 05/25/2018   K 4.2 05/25/2018   CL 107 05/25/2018   CO2 26 05/25/2018    Lab Results  Component Value Date   ALT 26 05/25/2018   AST 21 05/25/2018   ALKPHOS 97 11/03/2016   BILITOT 0.3 05/25/2018    Lab Results  Component Value Date   CHOL 177 03/28/2017   HDL 44 (L) 03/28/2017   LDLCALC 119 (H) 03/28/2017   TRIG 58 03/28/2017   CHOLHDL 4.0 03/28/2017   HIV 1 RNA Quant (copies/mL)  Date Value  10/02/2018 9,960 (H)  10/02/2018 10,400 (H)  08/27/2018 13,800 (H)   CD4 T Cell Abs (/uL)  Date Value  10/03/2018 94 (L)  08/27/2018 90 (L)  05/25/2018 110 (L)     Assessment & Plan:   Problem List Items Addressed This Visit      Unprioritized   HIV affecting pregnancy, antepartum    Counseled again on the importance of getting 100% of her doses in every week not only to reduce viral load to decrease risk for vertical transmission to her daughter in utero. We discussed pill tray to accurately understand how many doses she is really getting in. Also recommended asking a trusted loved one/family member to help hold her accountable for taking her medications (call or text). Pill alarm reminder as well I think will be helpful  for her--I actually encouraged her to set 2 alarms.  Will check viral load today. CD4 count remains low - being she is pregnant I do not suspect that this will reconstitute normally and prepared her for this to continue to be low throughout her pregnancy. She can help reduce the risk of opportunistic infections, however by being virologically suppressed through taking her Tivicay + Truvada daily.   If she has a viral load that is unchanged will refer to  home health nurse, Ambre for adherence support.       Vaccine counseling    Counseled on recommendation to receive flu shot while pregnant. She will talk with her OB team; hesitant to accept anything that is "not absolutely necessary" being this is her first pregnancy.       ASCUS with positive high risk HPV cervical    In care/managment with her GYN team; has a colposcopy scheduled tomorrow. We spent time discussing what this procedure is and why she needs it. Will at minimum need repeat pap in 6 months (February 2021).       Other Visit Diagnoses    HIV disease ([place])    -  Primary   Relevant Orders   HIV-1 RNA quant-no reflex-bld     I spent 15 minutes with the patient in face-to-face consultation and counseling of all the points detailed above.  Janene Madeira, MSN, NP-C [facility] [place] Pager: [phone] Office: [phone]  11/26/18  12:05 PM

## 2018-11-27 ENCOUNTER — Other Ambulatory Visit (HOSPITAL_COMMUNITY)
Admission: RE | Admit: 2018-11-27 | Discharge: 2018-11-27 | Disposition: A | Discharge status: Home / Self Care | Payer: Medicaid Other | Source: Ambulatory Visit | Attending: Obstetrics & Gynecology | Admitting: Obstetrics & Gynecology

## 2018-11-27 ENCOUNTER — Ambulatory Visit (INDEPENDENT_AMBULATORY_CARE_PROVIDER_SITE_OTHER): Payer: Medicaid Other | Admitting: Obstetrics & Gynecology

## 2018-11-27 VITALS — BP 126/84 | HR 92 | Wt 128.0 lb

## 2018-11-27 DIAGNOSIS — R87610 Atypical squamous cells of undetermined significance on cytologic smear of cervix (ASC-US): Secondary | ICD-10-CM

## 2018-11-27 DIAGNOSIS — O0990 Supervision of high risk pregnancy, unspecified, unspecified trimester: Secondary | ICD-10-CM

## 2018-11-27 DIAGNOSIS — R87810 Cervical high risk human papillomavirus (HPV) DNA test positive: Secondary | ICD-10-CM

## 2018-11-27 DIAGNOSIS — Z23 Encounter for immunization: Secondary | ICD-10-CM | POA: Diagnosis not present

## 2018-11-27 DIAGNOSIS — Z3A19 19 weeks gestation of pregnancy: Secondary | ICD-10-CM

## 2018-11-27 DIAGNOSIS — O0992 Supervision of high risk pregnancy, unspecified, second trimester: Secondary | ICD-10-CM

## 2018-11-27 DIAGNOSIS — O98712 Human immunodeficiency virus [HIV] disease complicating pregnancy, second trimester: Secondary | ICD-10-CM

## 2018-11-27 DIAGNOSIS — O98719 Human immunodeficiency virus [HIV] disease complicating pregnancy, unspecified trimester: Secondary | ICD-10-CM

## 2018-11-27 NOTE — Progress Notes (Signed)
   PRENATAL VISIT NOTE  Subjective:  Kimberly Bishop is a 28 y.o. G2P0010 at [redacted]w[redacted]d being seen today for ongoing prenatal care.  She is currently monitored for the following issues for this high-risk pregnancy and has Perinatal HIV exposure; HIV affecting pregnancy, antepartum; Molluscum contagiosum; At risk for opportunistic infections; Vaccine counseling; Screening examination for venereal disease; Encounter for long-term (current) use of high-risk medication; Supervision of high risk pregnancy, antepartum; and ASCUS with positive high risk HPV cervical on their problem list.  Patient reports no complaints.  Contractions: Not present. Vag. Bleeding: None.  Movement: Present. Denies leaking of fluid.   The following portions of the patient's history were reviewed and updated as appropriate: allergies, current medications, past family history, past medical history, past social history, past surgical history and problem list.   Objective:   Vitals:   11/27/18 0911  BP: 126/84  Pulse: 92  Weight: 128 lb (58.1 kg)    Fetal Status: Fetal Heart Rate (bpm): 154   Movement: Present     General:  Alert, oriented and cooperative. Patient is in no acute distress.  Skin: Skin is warm and dry. No rash noted.   Cardiovascular: Normal heart rate noted  Respiratory: Normal respiratory effort, no problems with respiration noted  Abdomen: Soft, gravid, appropriate for gestational age.  Pain/Pressure: Absent     Pelvic: Cervical exam deferred        Extremities: Normal range of motion.  Edema: None  Mental Status: Normal mood and affect. Normal behavior. Normal judgment and thought content.   UPT negative, consent signed, time out done Cervix prepped with acetic acid. Transformation zone seen in its entirety. Colpo adequate. 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  Silver nitrate achieved  hemostasis. She tolerated the procedure well.   Assessment and Plan:  Pregnancy: G2P0010 at [redacted]w[redacted]d 1. ASCUS with positive high risk HPV cervical - rec Gardasil when she is not pregnant - Surgical pathology( [place]/ [facility])  2. Supervision of high risk pregnancy, antepartum  - Flu Vaccine QUAD 36+ mos IM  3. HIV affecting pregnancy, antepartum  Preterm labor symptoms and general obstetric precautions including but not limited to vaginal bleeding, contractions, leaking of fluid and fetal movement were reviewed in detail with the patient. Please refer to After Visit Summary for other counseling recommendations.   No follow-ups on file.  Future Appointments  Date Time Provider [place]  12/06/2018  8:55 AM Rasch, Artist Pais, NP [place]  12/07/2018  9:30 AM [place] [facility]  12/07/2018  9:30 AM [facility] [place] 5 [facility]US  02/04/2019 11:15 AM [facility]  02/18/2019 11:15 AM Dixon, Melton Krebs, NP [facility]    Emily Filbert, MD

## 2018-11-28 ENCOUNTER — Encounter: Payer: Self-pay | Admitting: Obstetrics & Gynecology

## 2018-11-28 DIAGNOSIS — N870 Mild cervical dysplasia: Secondary | ICD-10-CM | POA: Insufficient documentation

## 2018-11-28 LAB — SURGICAL PATHOLOGY

## 2018-11-29 LAB — HIV-1 RNA QUANT-NO REFLEX-BLD
HIV 1 RNA Quant: 1190 copies/mL — ABNORMAL HIGH
HIV-1 RNA Quant, Log: 3.08 Log copies/mL — ABNORMAL HIGH

## 2018-12-05 ENCOUNTER — Telehealth: Payer: Self-pay | Admitting: Obstetrics and Gynecology

## 2018-12-05 NOTE — Telephone Encounter (Signed)
Spoke to patient about her appointment on 9/24 @ 8:55. Patient instructed to wear a face mask for the entire appointment and no visitors are allowed with her during the visit. Patient screened for covid symptoms and denied having any

## 2018-12-06 ENCOUNTER — Other Ambulatory Visit: Payer: Self-pay

## 2018-12-06 ENCOUNTER — Ambulatory Visit (INDEPENDENT_AMBULATORY_CARE_PROVIDER_SITE_OTHER): Payer: Medicaid Other | Admitting: Obstetrics and Gynecology

## 2018-12-06 VITALS — BP 107/67 | HR 84 | Temp 98.4°F | Wt 132.9 lb

## 2018-12-06 DIAGNOSIS — M6208 Separation of muscle (nontraumatic), other site: Secondary | ICD-10-CM | POA: Diagnosis not present

## 2018-12-06 DIAGNOSIS — O0992 Supervision of high risk pregnancy, unspecified, second trimester: Secondary | ICD-10-CM

## 2018-12-06 DIAGNOSIS — Z3A20 20 weeks gestation of pregnancy: Secondary | ICD-10-CM | POA: Diagnosis not present

## 2018-12-06 DIAGNOSIS — O0990 Supervision of high risk pregnancy, unspecified, unspecified trimester: Secondary | ICD-10-CM

## 2018-12-06 NOTE — Progress Notes (Signed)
   PRENATAL VISIT NOTE  Subjective:  Kimberly Bishop is a 28 y.o. G2P0010 at [redacted]w[redacted]d being seen today for ongoing prenatal care.  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  Patient reports no complaints.  Contractions: Not present. Vag. Bleeding: None.  Movement: Present. Denies leaking of fluid.   The following portions of the patient's history were reviewed and updated as appropriate: allergies, current medications, past family history, past medical history, past social history, past surgical history and problem list.   Objective:   Vitals:   12/06/18 0930  BP: 107/67  Pulse: 84  Temp: 98.4 F (36.9 C)  Weight: 132 lb 14.4 oz (60.3 kg)    Fetal Status: Fetal Heart Rate (bpm): 152   Movement: Present     General:  Alert, oriented and cooperative. Patient is in no acute distress.  Skin: Skin is warm and dry. No rash noted.   Cardiovascular: Normal heart rate noted  Respiratory: Normal respiratory effort, no problems with respiration noted  Abdomen: Soft, gravid, appropriate for gestational age.  Pain/Pressure: Present     Pelvic: Cervical exam deferred        Extremities: Normal range of motion.  Edema: None  Mental Status: Normal mood and affect. Normal behavior. Normal judgment and thought content.   Assessment and Plan:  Pregnancy: G2P0010 at [redacted]w[redacted]d  1. Supervision of high risk pregnancy, antepartum  - AFP, Serum, Open Spina Bifida - [place] scheduled for tomorrow.   2. Diastasis recti   Preterm labor  symptoms and general obstetric precautions including but not limited to vaginal bleeding, contractions, leaking of fluid and fetal movement were reviewed in detail with the patient. Please refer to After Visit Summary for other counseling recommendations.   Return in about 4 weeks (around 01/03/2019), or High Risk MD only.  Future Appointments  Date Time Provider [place]  12/07/2018  9:30 AM [place] [facility]  12/07/2018  9:30 AM [facility] [place] 5 WH-MFCUS M[facility]-US  02/04/2019 11:15 AM [facility]  02/18/2019 11:15 AM Dixon, Melton Krebs, NP [facility]    Noni Saupe, NP

## 2018-12-07 ENCOUNTER — Encounter (HOSPITAL_COMMUNITY): Payer: Self-pay

## 2018-12-07 ENCOUNTER — Ambulatory Visit (HOSPITAL_COMMUNITY)
Admission: RE | Admit: 2018-12-07 | Discharge: 2018-12-07 | Disposition: A | Discharge status: Home / Self Care | Payer: Medicaid Other | Source: Ambulatory Visit | Attending: Obstetrics and Gynecology | Admitting: Obstetrics and Gynecology

## 2018-12-07 ENCOUNTER — Ambulatory Visit (HOSPITAL_COMMUNITY): Payer: Medicaid Other | Admitting: *Deleted

## 2018-12-07 ENCOUNTER — Other Ambulatory Visit (HOSPITAL_COMMUNITY): Payer: Self-pay | Admitting: *Deleted

## 2018-12-07 VITALS — BP 116/62 | HR 89 | Temp 98.4°F

## 2018-12-07 DIAGNOSIS — Z362 Encounter for other antenatal screening follow-up: Secondary | ICD-10-CM

## 2018-12-07 DIAGNOSIS — Z3A20 20 weeks gestation of pregnancy: Secondary | ICD-10-CM

## 2018-12-07 DIAGNOSIS — O0990 Supervision of high risk pregnancy, unspecified, unspecified trimester: Secondary | ICD-10-CM | POA: Diagnosis present

## 2018-12-07 DIAGNOSIS — O98712 Human immunodeficiency virus [HIV] disease complicating pregnancy, second trimester: Secondary | ICD-10-CM | POA: Diagnosis not present

## 2018-12-12 LAB — AFP, SERUM, OPEN SPINA BIFIDA
AFP MoM: 0.88
AFP Value: 59.9 ng/mL
Gest. Age on Collection Date: 20 weeks
Maternal Age At EDD: 28.4 yr
OSBR Risk 1 IN: 10000
Test Results:: NEGATIVE
Weight: 132 [lb_av]

## 2018-12-20 ENCOUNTER — Encounter: Payer: Self-pay | Admitting: *Deleted

## 2019-01-03 ENCOUNTER — Ambulatory Visit (INDEPENDENT_AMBULATORY_CARE_PROVIDER_SITE_OTHER): Payer: Medicaid Other | Admitting: Obstetrics & Gynecology

## 2019-01-03 ENCOUNTER — Other Ambulatory Visit: Payer: Self-pay

## 2019-01-03 VITALS — BP 122/64 | HR 75 | Temp 98.6°F | Wt 139.3 lb

## 2019-01-03 DIAGNOSIS — O98719 Human immunodeficiency virus [HIV] disease complicating pregnancy, unspecified trimester: Secondary | ICD-10-CM

## 2019-01-03 DIAGNOSIS — O0992 Supervision of high risk pregnancy, unspecified, second trimester: Secondary | ICD-10-CM

## 2019-01-03 DIAGNOSIS — O0990 Supervision of high risk pregnancy, unspecified, unspecified trimester: Secondary | ICD-10-CM

## 2019-01-03 DIAGNOSIS — O98712 Human immunodeficiency virus [HIV] disease complicating pregnancy, second trimester: Secondary | ICD-10-CM

## 2019-01-03 DIAGNOSIS — Z3A24 24 weeks gestation of pregnancy: Secondary | ICD-10-CM

## 2019-01-03 NOTE — Patient Instructions (Signed)

## 2019-01-03 NOTE — Progress Notes (Signed)
   PRENATAL VISIT NOTE  Subjective:  Kimberly Bishop is a 28 y.o. G2P0010 at [redacted]w[redacted]d being seen today for ongoing prenatal care.  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  Patient reports no complaints.  Contractions: Not present. Vag. Bleeding: None.  Movement: Present. Denies leaking of fluid.   The following portions of the patient's history were reviewed and updated as appropriate: allergies, current medications, past family history, past medical history, past social history, past surgical history and problem list.   Objective:   Vitals:   01/03/19 1032  BP: 122/64  Pulse: 75  Temp: 98.6 F (37 C)  Weight: 139 lb 4.8 oz (63.2 kg)    Fetal Status: Fetal Heart Rate (bpm): 155   Movement: Present     General:  Alert, oriented and cooperative. Patient is in no acute distress.  Skin: Skin is warm and dry. No rash noted.   Cardiovascular: Normal heart rate noted  Respiratory: Normal respiratory effort, no problems with respiration noted  Abdomen: Soft, gravid, appropriate for gestational age.  Pain/Pressure: Present     Pelvic: Cervical exam deferred        Extremities: Normal range of motion.  Edema: None  Mental Status: Normal mood and affect. Normal behavior. Normal judgment and thought content.   Assessment and Plan:  Pregnancy: G2P0010 at [redacted]w[redacted]d 1. Supervision of high risk pregnancy, antepartum Nl growth on [place]  2. HIV affecting pregnancy, antepartum F/u with ID  Preterm labor symptoms and general  obstetric precautions including but not limited to vaginal bleeding, contractions, leaking of fluid and fetal movement were reviewed in detail with the patient. Please refer to After Visit Summary for other counseling recommendations.   Return in about 4 weeks (around 01/31/2019) for 2 hr GTT.  Future Appointments  Date Time Provider [place]  01/18/2019  9:45 AM [place] [facility]  01/18/2019  9:45 AM [place] 2 WH-MFC[facility]-US  01/31/2019  8:15 AM [place] Jude, MD [place]  01/31/2019  9:30 AM [facility]OC  02/04/2019 11:15 AM [facility]  02/18/2019 11:15 AM [place] Callas, NP [facility]    [name], MD

## 2019-01-08 ENCOUNTER — Other Ambulatory Visit: Payer: Self-pay

## 2019-01-08 ENCOUNTER — Ambulatory Visit (INDEPENDENT_AMBULATORY_CARE_PROVIDER_SITE_OTHER): Payer: Medicaid Other | Admitting: Women's Health

## 2019-01-08 VITALS — BP 112/59 | HR 75 | Temp 98.5°F

## 2019-01-08 DIAGNOSIS — R310 Gross hematuria: Secondary | ICD-10-CM

## 2019-01-08 DIAGNOSIS — Z3A25 25 weeks gestation of pregnancy: Secondary | ICD-10-CM

## 2019-01-08 DIAGNOSIS — O26892 Other specified pregnancy related conditions, second trimester: Secondary | ICD-10-CM

## 2019-01-08 LAB — POCT URINALYSIS DIP (DEVICE)
Bilirubin Urine: NEGATIVE
Glucose, UA: NEGATIVE mg/dL
Ketones, ur: NEGATIVE mg/dL
Nitrite: NEGATIVE
Protein, ur: 100 mg/dL — AB
Specific Gravity, Urine: 1.02 (ref 1.005–1.030)
Urobilinogen, UA: 0.2 mg/dL (ref 0.0–1.0)
pH: 7.5 (ref 5.0–8.0)

## 2019-01-08 MED ORDER — CEFADROXIL 500 MG PO CAPS: 500.0000 mg | ORAL_CAPSULE | Freq: Two times a day (BID) | ORAL | 0 refills | Status: AC

## 2019-01-08 NOTE — Progress Notes (Signed)
Subjective:  Kimberly Bishop is a 28 y.o. G2P0010 at [redacted]w[redacted]d being seen today for ongoing prenatal care.  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   Pt here for an unscheduled visit today due to blood in the urine. Pt reports she is sure that the bleeding is coming from her urine and not from the vagina, but consents to a pelvic exam to be sure. Pt reports the bleeding started two days ago after she had intercourse. Pt reports frequency of urination, but denies dysuria. Pt reports there is no blood present on toilet tissue when wiping, but reports the toilet water turns pink/red after she urinates. Pt also reports back pain as low back pain that was present prior to this issue. Pt denies any other problems, including any OB-related issues.  Denies leaking of fluid.   The following portions of the patient's history were reviewed and updated as appropriate: allergies, current medications, past family history, past medical history, past social history, past surgical history and problem list. Problem list updated.  Objective:   Vitals:   01/08/19 1626  BP: (!) 112/59  Pulse: 75  Temp: 98.5 F (36.9 C)  SpO2: 100%    Fetal Status: Fetal Heart Rate (bpm): 154         General:  Alert, oriented and cooperative. Patient is in no acute distress.  Skin: Skin is warm and dry. No rash noted.   Cardiovascular: Normal heart rate noted  Respiratory: Normal respiratory effort, no problems with respiration noted  Abdomen: Soft, gravid,  appropriate for gestational age.     Non-tender in all quadrants, no CVA tenderness.  Pelvic:      Normal speculum exam, no bleeding noted on internal or external genitalia. Cervical exam deferred        Extremities: Normal range of motion.     Mental Status: Normal mood and affect. Normal behavior. Normal judgment and thought content.   Urinalysis: lg hgb, 100PRO/lg leuks, otherwise WNL, sending urine for culture  Assessment and Plan:  Pregnancy: G2P0010 at [redacted]w[redacted]d  1. Gross hematuria - RX for Duricef for presumed UTI (meds and allergies confirmed) - discussed s/sx of pyelonephritis or worsening UTI, MAU directions given for worsening symptoms - POCT urinalysis dip (device) - Urine Culture  Preterm labor symptoms and general obstetric precautions including but not limited to vaginal bleeding, contractions, leaking of fluid and fetal movement were reviewed in detail with the patient.  Please refer to After Visit Summary for other counseling recommendations.  Return if symptoms worsen or fail to improve, for already has next [facility] visit scheduled 01/31/2019.   Nugent, Gerrie Nordmann, NP

## 2019-01-08 NOTE — Patient Instructions (Addendum)
The Maternity Assessment Unit (MAU) is located at the [facility] and [place] at [facility]. The address is: [address], [place], [place], [place] [address]. Please see map below for additional directions.    The Maternity Assessment Unit is designed to help you during your pregnancy, and for up to 6 weeks after delivery, with any pregnancy- or postpartum-related emergencies, if you think you are in labor, or if your water has broken. For example, if you experience nausea and vomiting, vaginal bleeding, severe abdominal or pelvic pain, elevated blood pressure or other problems related to your pregnancy or postpartum time, please come to the Maternity Assessment Unit for assistance.       Preterm Labor and Birth Information  The normal length of a pregnancy is 39-41 weeks. Preterm labor is when labor starts before 37 completed weeks of pregnancy. What are the risk factors for preterm labor? Preterm labor is more likely to occur in women who:  Have certain infections during pregnancy such as a bladder infection, sexually transmitted infection, or infection inside the uterus (chorioamnionitis).  Have a shorter-than-normal cervix.  Have gone into preterm labor before.  Have had surgery on their cervix.  Are younger than age 13 or older than age 61.  Are African American.  Are pregnant with twins or multiple babies (multiple gestation).  Take street drugs or smoke while pregnant.  Do not gain enough weight while pregnant.  Became pregnant shortly after having been pregnant. What are the symptoms of preterm labor? Symptoms of preterm labor include:  Cramps similar to those that can happen during a menstrual period. The cramps may happen with diarrhea.  Pain in the abdomen or lower back.  Regular uterine contractions that may feel like tightening of the abdomen.  A feeling of increased pressure in the pelvis.  Increased watery or bloody mucus  discharge from the vagina.  Water breaking (ruptured amniotic sac). Why is it important to recognize signs of preterm labor? It is important to recognize signs of preterm labor because babies who are born prematurely may not be fully developed. This can put them at an increased risk for:  Long-term (chronic) heart and lung problems.  Difficulty immediately after birth with regulating body systems, including blood sugar, body temperature, heart rate, and breathing rate.  Bleeding in the brain.  Cerebral palsy.  Learning difficulties.  Death. These risks are highest for babies who are born before 25 weeks of pregnancy. How is preterm labor treated? Treatment depends on the length of your pregnancy, your condition, and the health of your baby. It may involve:  Having a stitch (suture) placed in your cervix to prevent your cervix from opening too early (cerclage).  Taking or being given medicines, such as: ? Hormone medicines. These may be given early in pregnancy to help support the pregnancy. ? Medicine to stop contractions. ? Medicines to help mature the babys lungs. These may be prescribed if the risk of delivery is high. ? Medicines to prevent your baby from developing cerebral palsy. If the labor happens before 34 weeks of pregnancy, you may need to stay in the hospital. What should I do if I think I am in preterm labor? If you think that you are going into preterm labor, call your health care provider right away. How can I prevent preterm labor in future pregnancies? To increase your chance of having a full-term pregnancy:  Do not use any tobacco products, such as cigarettes, chewing tobacco, and e-cigarettes. If  you need help quitting, ask your health care provider.  Do not use street drugs or medicines that have not been prescribed to you during your pregnancy.  Talk with your health care provider before taking any herbal supplements, even if you have been taking them  regularly.  Make sure you gain a healthy amount of weight during your pregnancy.  Watch for infection. If you think that you might have an infection, get it checked right away.  Make sure to tell your health care provider if you have gone into preterm labor before. This information is not intended to replace advice given to you by your health care provider. Make sure you discuss any questions you have with your health care provider. Document Released: 05/21/2003 Document Revised: 06/22/2018 Document Reviewed: 07/22/2015 Elsevier Patient Education  [place]. Hematuria, Adult Hematuria is blood in the urine. Blood may be visible in the urine, or it may be identified with a test. This condition can be caused by infections of the bladder, urethra, kidney, or prostate. Other possible causes include:  Kidney stones.  Cancer of the urinary tract.  Too much calcium in the urine.  Conditions that are passed from parent to child (inherited conditions).  Exercise that requires a lot of energy. Infections can usually be treated with medicine, and a kidney stone usually will pass through your urine. If neither of these is the cause of your hematuria, more tests may be needed to identify the cause of your symptoms. It is very important to tell your health care provider about any blood in your urine, even if it is painless or the blood stops without treatment. Blood in the urine, when it happens and then stops and then happens again, can be a symptom of a very serious condition, including cancer. There is no pain in the initial stages of many urinary cancers. Follow these instructions at home: Medicines  Take over-the-counter and prescription medicines only as told by your health care provider.  If you were prescribed an antibiotic medicine, take it as told by your health care provider. Do not stop taking the antibiotic even if you start to feel better. Eating and drinking  Drink enough  fluid to keep your urine clear or pale yellow. It is recommended that you drink 3-4 quarts (2.8-3.8 L) a day. If you have been diagnosed with an infection, it is recommended that you drink cranberry juice in addition to large amounts of water.  Avoid caffeine, tea, and carbonated beverages. These tend to irritate the bladder.  Avoid alcohol because it may irritate the prostate (men). General instructions  If you have been diagnosed with a kidney stone, follow your health care provider's instructions about straining your urine to catch the stone.  Empty your bladder often. Avoid holding urine for long periods of time.  If you are female: ? After a bowel movement, wipe from front to back and use each piece of toilet paper only once. ? Empty your bladder before and after sex.  Pay attention to any changes in your symptoms. Tell your health care provider about any changes or any new symptoms.  It is your responsibility to get your test results. Ask your health care provider, or the department performing the test, when your results will be ready.  Keep all follow-up visits as told by your health care provider. This is important. Contact a health care provider if:  You develop back pain.  You have a fever.  You have nausea or vomiting.  Your symptoms do not improve after 3 days.  Your symptoms get worse. Get help right away if:  You develop severe vomiting and are unable take medicine without vomiting.  You develop severe pain in your back or abdomen even though you are taking medicine.  You pass a large amount of blood in your urine.  You pass blood clots in your urine.  You feel very weak or like you might faint.  You faint. Summary  Hematuria is blood in the urine. It has many possible causes.  It is very important that you tell your health care provider about any blood in your urine, even if it is painless or the blood stops without treatment.  Take over-the-counter  and prescription medicines only as told by your health care provider.  Drink enough fluid to keep your urine clear or pale yellow. This information is not intended to replace advice given to you by your health care provider. Make sure you discuss any questions you have with your health care provider. Document Released: 02/28/2005 Document Revised: 02/10/2017 Document Reviewed: 04/02/2016 Elsevier Patient Education  2020 [place].  Pregnancy and Urinary Tract Infection  A urinary tract infection (UTI) is an infection of any part of the urinary tract. This includes the kidneys, the tubes that connect your kidneys to your bladder (ureters), the bladder, and the tube that carries urine out of your body (urethra). These organs make, store, and get rid of urine in the body. Your health care provider may use other names to describe the infection. An upper UTI affects the ureters and kidneys (pyelonephritis). A lower UTI affects the bladder (cystitis) and urethra (urethritis). Most urinary tract infections are caused by bacteria in your genital area, around the entrance to your urinary tract (urethra). These bacteria grow and cause irritation and inflammation of your urinary tract. You are more likely to develop a UTI during pregnancy because the physical and hormonal changes your body goes through can make it easier for bacteria to get into your urinary tract. Your growing baby also puts pressure on your bladder and can affect urine flow. It is important to recognize and treat UTIs in pregnancy because of the risk of serious complications for both you and your baby. How does this affect me? Symptoms of a UTI include:  Needing to urinate right away (urgently).  Frequent urination or passing small amounts of urine frequently.  Pain or burning with urination.  Blood in the urine.  Urine that smells bad or unusual.  Trouble urinating.  Cloudy urine.  Pain in the abdomen or lower  back.  Vaginal discharge. You may also have:  Vomiting or a decreased appetite.  Confusion.  Irritability or tiredness.  A fever.  Diarrhea. How does this affect my baby? An untreated UTI during pregnancy could lead to a kidney infection or a systemic infection, which can cause health problems that could affect your baby. Possible complications of an untreated UTI include:  Giving birth to your baby before 37 weeks of pregnancy (premature).  Having a baby with a low birth weight.  Developing high blood pressure during pregnancy (preeclampsia).  Having a low hemoglobin level (anemia). What can I do to lower my risk? To prevent a UTI:  Go to the bathroom as soon as you feel the need. Do not hold urine for long periods of time.  Always wipe from front to back, especially after a bowel movement. Use each tissue one time when you wipe.  Empty your bladder  after sex.  Keep your genital area dry.  Drink 6-10 glasses of water each day.  Do not douche or use deodorant sprays. How is this treated? Treatment for this condition may include:  Antibiotic medicines that are safe to take during pregnancy.  Other medicines to treat less common causes of UTI. Follow these instructions at home:  If you were prescribed an antibiotic medicine, take it as told by your health care provider. Do not stop using the antibiotic even if you start to feel better.  Keep all follow-up visits as told by your health care provider. This is important. Contact a health care provider if:  Your symptoms do not improve or they get worse.  You have abnormal vaginal discharge. Get help right away if you:  Have a fever.  Have nausea and vomiting.  Have back or side pain.  Feel contractions in your uterus.  Have lower belly pain.  Have a gush of fluid from your vagina.  Have blood in your urine. Summary  A urinary tract infection (UTI) is an infection of any part of the urinary tract,  which includes the kidneys, ureters, bladder, and urethra.  Most urinary tract infections are caused by bacteria in your genital area, around the entrance to your urinary tract (urethra).  You are more likely to develop a UTI during pregnancy.  If you were prescribed an antibiotic medicine, take it as told by your health care provider. Do not stop using the antibiotic even if you start to feel better. This information is not intended to replace advice given to you by your health care provider. Make sure you discuss any questions you have with your health care provider. Document Released: 06/25/2010 Document Revised: 06/22/2018 Document Reviewed: 02/01/2018 Elsevier Patient Education  [place].  Pyelonephritis During Pregnancy  What are the causes? This condition is caused by a bacterial infection in the lower urinary tract that spreads to the kidney. What increases the risk? You are more likely to develop this condition if:  You have diabetes.  You have a history of frequent urinary tract infections. What are the signs or symptoms? Symptoms of this condition may begin with symptoms of a lower urinary tract infection. These may include:  A frequent urge to pass urine.  Burning pain when passing urine.  Pain and pressure in your lower abdomen.  Blood in your urine.  Cloudy or smelly urine. As the infection spreads to your kidney, you may have these symptoms:  Fever.  Chills.  Pain and tenderness in your upper abdomen or in your back and sides (flank pain). Flank pain often affects one side of the body, usually the right side.  Nausea, vomiting, or loss of appetite. How is this diagnosed? This condition may be diagnosed based on:  Your symptoms and medical history.  A physical exam.  Tests to confirm the diagnosis. These may include: ? Blood tests to check kidney function and look for signs of infection. ? Urine tests to check for signs of infection,  including bacteria, white or red blood cells, and protein. ? Tests to grow and identify the type of bacteria that is causing the infection (urine culture). ? Imaging studies of your kidneys to learn more about your condition. How is this treated? This condition is treated in the hospital with antibiotics that are given into one of your veins through an IV. Your health care provider:  Will start you on an antibiotic that is effective against common urinary tract infections.  May switch to another antibiotic if the results of your urine culture show that your infection is caused by different bacteria.  Will be careful to choose antibiotics that are the safest during pregnancy. Other treatments may include:  IV fluids if you are nauseous and not able to drink fluids.  Pain and fever medicines.  Medicines for nausea and vomiting. You will be able to go home when your infection is under control. To prevent another infection, you may need to continue taking antibiotics by mouth until your baby is born. Follow these instructions at home: Medicines   Take over-the-counter and prescription medicines only as told by your health care provider.  Take your antibiotic medicine as told by your health care provider. Do not stop taking the antibiotic even if you start to feel better.  Continue to take your prenatal vitamins. Lifestyle   Follow instructions from your health care provider about eating or drinking restrictions. You may need to avoid: ? Foods and drinks with added sugar. ? Caffeine and fruit juice.  Drink enough fluid to keep your urine pale yellow.  Go to the bathroom frequently. Do not hold your urine. Try to empty your bladder completely.  Change your underwear every day. Wear all-cotton underwear. Do not wear tight underwear or pants. General instructions   Take these steps to lower the risk of bacteria getting into your urinary tract: ? Use liquid soap instead of bar soap  when showering or bathing. Bacteria can grow on bar soap. ? When you wash yourself, clean the urethra opening first. Use a washcloth to clean the area between your vagina and anus. Pat the area dry with a clean towel. ? Wash your hands before and after you go to the bathroom. ? Wipe yourself from front to back after going to the bathroom. ? Do not use douches, perfumed soap, creams, or powders. ? Do not soak in a bath for more than 30 minutes.  Return to your normal activities as told by your health care provider. Ask your health care provider what activities are safe for you.  Keep all follow-up visits as told by your health care provider. This is important. Contact a health care provider if:  You have chills or a fever.  You have any symptoms of infection that do not get better at home.  Symptoms of infection come back.  You have a reaction or side effects from your antibiotic. Get help right away if:  You start having contractions. Summary  Pyelonephritis is an infection of the kidney or kidneys.  This condition results when a bacterial infection in the lower urinary tract spreads to the kidney.  Lower urinary tract infections are common during pregnancy.  Pyelonephritis causes chills, a fever, flank pain, and nausea.  Pyelonephritis is a serious infection that is usually treated in the hospital with IV antibiotics. This information is not intended to replace advice given to you by your health care provider. Make sure you discuss any questions you have with your health care provider. Document Released: 06/01/2017 Document Revised: 06/22/2018 Document Reviewed: 06/01/2017 Elsevier Patient Education  2020 [employer].

## 2019-01-10 LAB — URINE CULTURE

## 2019-01-18 ENCOUNTER — Other Ambulatory Visit: Payer: Self-pay

## 2019-01-18 ENCOUNTER — Other Ambulatory Visit (HOSPITAL_COMMUNITY): Payer: Self-pay | Admitting: *Deleted

## 2019-01-18 ENCOUNTER — Ambulatory Visit (HOSPITAL_COMMUNITY)
Admission: RE | Admit: 2019-01-18 | Discharge: 2019-01-18 | Disposition: A | Discharge status: Home / Self Care | Payer: Medicaid Other | Source: Ambulatory Visit | Attending: Obstetrics | Admitting: Obstetrics

## 2019-01-18 ENCOUNTER — Encounter (HOSPITAL_COMMUNITY): Payer: Self-pay

## 2019-01-18 ENCOUNTER — Ambulatory Visit (HOSPITAL_COMMUNITY): Payer: Medicaid Other | Admitting: *Deleted

## 2019-01-18 DIAGNOSIS — O0990 Supervision of high risk pregnancy, unspecified, unspecified trimester: Secondary | ICD-10-CM

## 2019-01-18 DIAGNOSIS — O98712 Human immunodeficiency virus [HIV] disease complicating pregnancy, second trimester: Secondary | ICD-10-CM | POA: Diagnosis not present

## 2019-01-18 DIAGNOSIS — M6208 Separation of muscle (nontraumatic), other site: Secondary | ICD-10-CM | POA: Insufficient documentation

## 2019-01-18 DIAGNOSIS — N870 Mild cervical dysplasia: Secondary | ICD-10-CM

## 2019-01-18 DIAGNOSIS — Z362 Encounter for other antenatal screening follow-up: Secondary | ICD-10-CM | POA: Insufficient documentation

## 2019-01-18 DIAGNOSIS — R310 Gross hematuria: Secondary | ICD-10-CM | POA: Diagnosis present

## 2019-01-18 DIAGNOSIS — Z3A26 26 weeks gestation of pregnancy: Secondary | ICD-10-CM

## 2019-01-18 DIAGNOSIS — O98719 Human immunodeficiency virus [HIV] disease complicating pregnancy, unspecified trimester: Secondary | ICD-10-CM

## 2019-01-29 ENCOUNTER — Other Ambulatory Visit: Payer: Self-pay | Admitting: *Deleted

## 2019-01-29 DIAGNOSIS — O0990 Supervision of high risk pregnancy, unspecified, unspecified trimester: Secondary | ICD-10-CM

## 2019-01-30 ENCOUNTER — Telehealth: Payer: Self-pay | Admitting: Family Medicine

## 2019-01-30 NOTE — Telephone Encounter (Signed)
Spoke to patient about her appointment on 11/19 @ 8:15. Patient instructed to wear a face mask for the entire appointment and no visitors are allowed with her during the visit. Patient screened for covid symptoms and denied having any

## 2019-01-31 ENCOUNTER — Other Ambulatory Visit: Payer: Medicaid Other

## 2019-01-31 ENCOUNTER — Ambulatory Visit (INDEPENDENT_AMBULATORY_CARE_PROVIDER_SITE_OTHER): Payer: Medicaid Other | Admitting: Family Medicine

## 2019-01-31 ENCOUNTER — Other Ambulatory Visit: Payer: Self-pay

## 2019-01-31 VITALS — BP 130/66 | HR 76 | Wt 145.3 lb

## 2019-01-31 DIAGNOSIS — Z3A28 28 weeks gestation of pregnancy: Secondary | ICD-10-CM | POA: Diagnosis not present

## 2019-01-31 DIAGNOSIS — O98713 Human immunodeficiency virus [HIV] disease complicating pregnancy, third trimester: Secondary | ICD-10-CM | POA: Diagnosis present

## 2019-01-31 DIAGNOSIS — O0990 Supervision of high risk pregnancy, unspecified, unspecified trimester: Secondary | ICD-10-CM

## 2019-01-31 DIAGNOSIS — Z23 Encounter for immunization: Secondary | ICD-10-CM | POA: Diagnosis not present

## 2019-01-31 DIAGNOSIS — O98719 Human immunodeficiency virus [HIV] disease complicating pregnancy, unspecified trimester: Secondary | ICD-10-CM

## 2019-01-31 DIAGNOSIS — O0993 Supervision of high risk pregnancy, unspecified, third trimester: Secondary | ICD-10-CM

## 2019-01-31 NOTE — Patient Instructions (Signed)
Contraception Choices Contraception, also called birth control, refers to methods or devices that prevent pregnancy. Hormonal methods Contraceptive implant  A contraceptive implant is a thin, plastic tube that contains a hormone. It is inserted into the upper part of the arm. It can remain in place for up to 3 years. Progestin-only injections Progestin-only injections are injections of progestin, a synthetic form of the hormone progesterone. They are given every 3 months by a health care provider. Birth control pills  Birth control pills are pills that contain hormones that prevent pregnancy. They must be taken once a day, preferably at the same time each day. Birth control patch  The birth control patch contains hormones that prevent pregnancy. It is placed on the skin and must be changed once a week for three weeks and removed on the fourth week. A prescription is needed to use this method of contraception. Vaginal ring  A vaginal ring contains hormones that prevent pregnancy. It is placed in the vagina for three weeks and removed on the fourth week. After that, the process is repeated with a new ring. A prescription is needed to use this method of contraception. Emergency contraceptive Emergency contraceptives prevent pregnancy after unprotected sex. They come in pill form and can be taken up to 5 days after sex. They work best the sooner they are taken after having sex. Most emergency contraceptives are available without a prescription. This method should not be used as your only form of birth control. Barrier methods Female condom  A female condom is a thin sheath that is worn over the penis during sex. Condoms keep sperm from going inside a woman's body. They can be used with a spermicide to increase their effectiveness. They should be disposed after a single use. Female condom  A female condom is a soft, loose-fitting sheath that is put into the vagina before sex. The condom keeps  sperm from going inside a woman's body. They should be disposed after a single use. Diaphragm  A diaphragm is a soft, dome-shaped barrier. It is inserted into the vagina before sex, along with a spermicide. The diaphragm blocks sperm from entering the uterus, and the spermicide kills sperm. A diaphragm should be left in the vagina for 6-8 hours after sex and removed within 24 hours. A diaphragm is prescribed and fitted by a health care provider. A diaphragm should be replaced every 1-2 years, after giving birth, after gaining more than 15 lb (6.8 kg), and after pelvic surgery. Cervical cap  A cervical cap is a round, soft latex or plastic cup that fits over the cervix. It is inserted into the vagina before sex, along with spermicide. It blocks sperm from entering the uterus. The cap should be left in place for 6-8 hours after sex and removed within 48 hours. A cervical cap must be prescribed and fitted by a health care provider. It should be replaced every 2 years. Sponge  A sponge is a soft, circular piece of polyurethane foam with spermicide on it. The sponge helps block sperm from entering the uterus, and the spermicide kills sperm. To use it, you make it wet and then insert it into the vagina. It should be inserted before sex, left in for at least 6 hours after sex, and removed and thrown away within 30 hours. Spermicides Spermicides are chemicals that kill or block sperm from entering the cervix and uterus. They can come as a cream, jelly, suppository, foam, or tablet. A spermicide should be inserted into  vagina with an applicator at least 10-15 minutes before sex to allow time for it to work. The process must be repeated every time you have sex. Spermicides do not require a prescription. Intrauterine contraception Intrauterine device (IUD) An IUD is a T-shaped device that is put in a woman's uterus. There are two types:  Hormone IUD.This type contains progestin, a synthetic form of the hormone  progesterone. This type can stay in place for 3-5 years.  Copper IUD.This type is wrapped in copper wire. It can stay in place for 10 years.  Permanent methods of contraception Female tubal ligation In this method, a woman's fallopian tubes are sealed, tied, or blocked during surgery to prevent eggs from traveling to the uterus. Hysteroscopic sterilization In this method, a small, flexible insert is placed into each fallopian tube. The inserts cause scar tissue to form in the fallopian tubes and block them, so sperm cannot reach an egg. The procedure takes about 3 months to be effective. Another form of birth control must be used during those 3 months. Female sterilization This is a procedure to tie off the tubes that carry sperm (vasectomy). After the procedure, the man can still ejaculate fluid (semen). Natural planning methods Natural family planning In this method, a couple does not have sex on days when the woman could become pregnant. Calendar method This means keeping track of the length of each menstrual cycle, identifying the days when pregnancy can happen, and not having sex on those days. Ovulation method In this method, a couple avoids sex during ovulation. Symptothermal method This method involves not having sex during ovulation. The woman typically checks for ovulation by watching changes in her temperature and in the consistency of cervical mucus. Post-ovulation method In this method, a couple waits to have sex until after ovulation. Summary  Contraception, also called birth control, means methods or devices that prevent pregnancy.  Hormonal methods of contraception include implants, injections, pills, patches, vaginal rings, and emergency contraceptives.  Barrier methods of contraception can include female condoms, female condoms, diaphragms, cervical caps, sponges, and spermicides.  There are two types of IUDs (intrauterine devices). An IUD can be put in a woman's uterus to  prevent pregnancy for 3-5 years.  Permanent sterilization can be done through a procedure for males, females, or both.  Natural family planning methods involve not having sex on days when the woman could become pregnant. This information is not intended to replace advice given to you by your health care provider. Make sure you discuss any questions you have with your health care provider. Document Released: 02/28/2005 Document Revised: 03/02/2017 Document Reviewed: 04/02/2016 Elsevier Patient Education  2020 [employer]  

## 2019-01-31 NOTE — Progress Notes (Signed)
   PRENATAL VISIT NOTE  Subjective:  Kimberly Bishop is a 28 y.o. G2P0010 at [redacted]w[redacted]d being seen today for ongoing prenatal care.  She is currently monitored for the following issues for this high-risk pregnancy and has HIV affecting pregnancy, antepartum; Molluscum contagiosum; At risk for opportunistic infections; Encounter for long-term (current) use of high-risk medication; Supervision of high risk pregnancy, antepartum; ASCUS with positive high risk HPV cervical; CIN I (cervical intraepithelial neoplasia I); Diastasis recti; and Hematuria, gross on their problem list.  Patient reports no complaints.  Contractions: Not present. Vag. Bleeding: None.  Movement: Present. Denies leaking of fluid.   The following portions of the patient's history were reviewed and updated as appropriate: allergies, current medications, past family history, past medical history, past social history, past surgical history and problem list.   Objective:   Vitals:   01/31/19 0833  BP: 130/66  Pulse: 76  Weight: 145 lb 4.8 oz (65.9 kg)    Fetal Status:     Movement: Present     General:  Alert, oriented and cooperative. Patient is in no acute distress.  Skin: Skin is warm and dry. No rash noted.   Cardiovascular: Normal heart rate noted  Respiratory: Normal respiratory effort, no problems with respiration noted  Abdomen: Soft, gravid, appropriate for gestational age.  Pain/Pressure: Absent     Pelvic: Cervical exam deferred        Extremities: Normal range of motion.  Edema: None  Mental Status: Normal mood and affect. Normal behavior. Normal judgment and thought content.   Assessment and Plan:  Pregnancy: G2P0010 at [redacted]w[redacted]d 1. HIV affecting pregnancy, antepartum Continue Truvada and Tivicay ID following  2. Supervision of high risk pregnancy, antepartum Continue prenatal care. 28 wk and TDaP today   Preterm labor symptoms and general obstetric precautions including but not limited to vaginal  bleeding, contractions, leaking of fluid and fetal movement were reviewed in detail with the patient. Please refer to After Visit Summary for other counseling recommendations.   Return in 2 weeks (on 02/14/2019) for virtual, [place].  Future Appointments  Date Time Provider [place]  02/04/2019 11:15 AM [facility]-RCID LAB RCID-RCID RCID  02/15/2019  9:55 AM Anyanwu, Sallyanne Havers, MD WOC-WOCA WOC  02/18/2019 11:15 AM [place] Callas, NP RCID-RCID RCID  02/22/2019  9:45 AM WH-MFC [place] 2 WH-MFCUS MFC-US  02/22/2019  9:50 AM WH-MFC NURSE WH-MFC MFC-US    Donnamae Jude, MD

## 2019-02-01 LAB — CBC
Hematocrit: 29.9 % — ABNORMAL LOW (ref 34.0–46.6)
Hemoglobin: 9.9 g/dL — ABNORMAL LOW (ref 11.1–15.9)
MCH: 29.2 pg (ref 26.6–33.0)
MCHC: 33.1 g/dL (ref 31.5–35.7)
MCV: 88 fL (ref 79–97)
Platelets: 254 10*3/uL (ref 150–450)
RBC: 3.39 x10E6/uL — ABNORMAL LOW (ref 3.77–5.28)
RDW: 13.6 % (ref 11.7–15.4)
WBC: 8.6 10*3/uL (ref 3.4–10.8)

## 2019-02-01 LAB — HIV ANTIBODY (ROUTINE TESTING W REFLEX): HIV Screen 4th Generation wRfx: REACTIVE — AB

## 2019-02-01 LAB — RPR: RPR Ser Ql: NONREACTIVE

## 2019-02-01 LAB — HIV 1/2 AB DIFFERENTIATION
HIV 1 Ab: POSITIVE — AB
HIV 2 Ab: NEGATIVE
NOTE (HIV CONF MULTIP: POSITIVE

## 2019-02-01 LAB — GLUCOSE TOLERANCE, 2 HOURS W/ 1HR
Glucose, 1 hour: 95 mg/dL (ref 65–179)
Glucose, 2 hour: 65 mg/dL (ref 65–152)
Glucose, Fasting: 86 mg/dL (ref 65–91)

## 2019-02-04 ENCOUNTER — Other Ambulatory Visit: Payer: Self-pay

## 2019-02-04 DIAGNOSIS — B20 Human immunodeficiency virus [HIV] disease: Secondary | ICD-10-CM

## 2019-02-05 LAB — T-HELPER CELL (CD4) - (RCID CLINIC ONLY)
CD4 % Helper T Cell: 10 % — ABNORMAL LOW (ref 33–65)
CD4 T Cell Abs: 68 /uL — ABNORMAL LOW (ref 400–1790)

## 2019-02-06 NOTE — Progress Notes (Signed)
Ugh.. will see what viral load is but CD4 is worse.

## 2019-02-11 LAB — HIV-1 RNA QUANT-NO REFLEX-BLD
HIV 1 RNA Quant: 4750 copies/mL — ABNORMAL HIGH
HIV-1 RNA Quant, Log: 3.68 Log copies/mL — ABNORMAL HIGH

## 2019-02-15 ENCOUNTER — Telehealth: Payer: Self-pay

## 2019-02-15 ENCOUNTER — Telehealth (INDEPENDENT_AMBULATORY_CARE_PROVIDER_SITE_OTHER): Payer: Medicaid Other | Admitting: Obstetrics & Gynecology

## 2019-02-15 VITALS — BP 120/65 | HR 77

## 2019-02-15 DIAGNOSIS — Z3A30 30 weeks gestation of pregnancy: Secondary | ICD-10-CM

## 2019-02-15 DIAGNOSIS — Z283 Underimmunization status: Secondary | ICD-10-CM

## 2019-02-15 DIAGNOSIS — O99892 Other specified diseases and conditions complicating childbirth: Secondary | ICD-10-CM

## 2019-02-15 DIAGNOSIS — O0990 Supervision of high risk pregnancy, unspecified, unspecified trimester: Secondary | ICD-10-CM

## 2019-02-15 DIAGNOSIS — O98713 Human immunodeficiency virus [HIV] disease complicating pregnancy, third trimester: Secondary | ICD-10-CM

## 2019-02-15 DIAGNOSIS — O98719 Human immunodeficiency virus [HIV] disease complicating pregnancy, unspecified trimester: Secondary | ICD-10-CM

## 2019-02-15 DIAGNOSIS — O0993 Supervision of high risk pregnancy, unspecified, third trimester: Secondary | ICD-10-CM

## 2019-02-15 DIAGNOSIS — Z2839 Other underimmunization status: Secondary | ICD-10-CM | POA: Insufficient documentation

## 2019-02-15 NOTE — Progress Notes (Signed)
I connected with  [place] on 02/15/19 at  9:55 AM EST by telephone and verified that I am speaking with the correct person using two identifiers.   I discussed the limitations, risks, security and privacy concerns of performing an evaluation and management service by telephone and the availability of in person appointments. I also discussed with the patient that there may be a patient responsible charge related to this service. The patient expressed understanding and agreed to proceed.  Annabell Howells, RN 02/15/2019  9:40 AM

## 2019-02-15 NOTE — Telephone Encounter (Signed)
COVID-19 Pre-Screening Questions:02/15/19   Do you currently have a fever (>100 F), chills or unexplained body aches? NO   Are you currently experiencing new cough, shortness of breath, sore throat, runny nose? NO  .  Have you recently travelled outside the state of [place] in the last 14 days? NO .  Have you been in contact with someone that is currently pending confirmation of Covid19 testing or has been confirmed to have the [place] virus?  NO  **If the patient answers NO to ALL questions -  advise the patient to please call the clinic before coming to the office should any symptoms develop.

## 2019-02-15 NOTE — Patient Instructions (Signed)
Return to office for any scheduled appointments. Call the office or go to the MA[facility] at [facility] [place] if:  You begin to have strong, frequent contractions  Your water breaks.  Sometimes it is a big gush of fluid, sometimes it is just a trickle that keeps getting your panties wet or running down your legs  You have vaginal bleeding.  It is normal to have a small amount of spotting if your cervix was checked.   You do not feel your baby moving like normal.  If you do not, get something to eat and drink and lay down and focus on feeling your baby move.   If your baby is still not moving like normal, you should call the office or go to MAU.  Any other obstetric concerns.   

## 2019-02-15 NOTE — Progress Notes (Signed)
[facility] VIRTUAL VIDEO VISIT ENCOUNTER NOTE  Provider location: [facility] [place]   I connected with France Ravens on 02/15/19 at  9:55 AM EST by MyChart Video Encounter at home and verified that I am speaking with the correct person using two identifiers.   I discussed the limitations, risks, security and privacy concerns of performing an evaluation and management service virtually and the availability of in person appointments. I also discussed with the patient that there may be a patient responsible charge related to this service. The patient expressed understanding and agreed to proceed. Subjective:  GENIECE CUPPETT is a 28 y.o. G2P0010 at [redacted]w[redacted]d being seen today for ongoing prenatal care.  She is currently monitored for the following issues for this high-risk pregnancy and has HIV affecting pregnancy, antepartum; Molluscum contagiosum; At risk for opportunistic infections; Encounter for long-term (current) use of high-risk medication; Supervision of high risk pregnancy, antepartum; ASCUS with positive high risk HPV cervical; CIN I (cervical intraepithelial neoplasia I); Diastasis recti; Hematuria, gross; and Rubella nonimmune status, delivered, current hospitalization on their problem list.  Patient reports no complaints.  Contractions: Not present. Vag. Bleeding: None.  Movement: Present. Denies any leaking of fluid.   The following portions of the patient's history were reviewed and updated as appropriate: allergies, current medications, past family history, past medical history, past social history, past surgical history and problem list.   Objective:   Vitals:   02/15/19 0943  BP: 120/65  Pulse: 77    Fetal Status:     Movement: Present     General:  Alert, oriented and cooperative. Patient is in no acute distress.  Respiratory: Normal respiratory effort, no problems with respiration noted  Mental Status: Normal mood and  affect. Normal behavior. Normal judgment and thought content.  Rest of physical exam deferred due to type of encounter  Imaging: [place] Mfm Ob Follow Up  Result Date: 01/18/2019 ----------------------------------------------------------------------  OBSTETRICS REPORT                       (Signed Final 01/18/2019 11:14 am) ---------------------------------------------------------------------- Patient Info  ID #:       [id number]                          D.O.B.:  [birth year] (28 yrs)  Name:       France Ravens             Visit Date: 01/18/2019 10:22 am ---------------------------------------------------------------------- Performed By  Performed By:     Vance Peper BS,      Ref. Address:     [address] [place]  [place], [place]  Attending:        Johnell Comings MD         [facility]  Referred By:      Osborne Oman MD ---------------------------------------------------------------------- Orders   #  Description                          Code         Ordered By   1  [place] MFM OB FOLLOW UP                  [phone]     YU FANG  ----------------------------------------------------------------------   #  Order #                    Accession #                 Episode #   1  [id number]                  [id number]                  [id number]  ---------------------------------------------------------------------- Indications   HIV affecting pregnancy, second trimester      O98.712   (Truvada, Tivicay)   [redacted] weeks gestation of pregnancy                Z3A.26   Antenatal screening for malformations          Z36.3  ---------------------------------------------------------------------- Fetal Evaluation   Num Of Fetuses:         1  Fetal Heart Rate(bpm):  157  Cardiac Activity:       Observed  Presentation:           Cephalic  Placenta:               Anterior  P. Cord Insertion:      Previously Visualized  Amniotic Fluid  AFI FV:      Within normal limits  AFI Sum(cm)     %Tile       Largest Pocket(cm)  15.76           56          3.94  RUQ(cm)       RLQ(cm)       LUQ(cm)        LLQ(cm)  3.94          3.94          3.94           3.94 ---------------------------------------------------------------------- Biometry  BPD:  62.3  mm     G. Age:  25w 2d          9  %    CI:        67.65   %    70 - 86                                                          FL/HC:      20.5   %    18.6 - 20.4  HC:      242.4  mm     G. Age:  26w 2d         21  %    HC/AC:      1.12        1.04 - 1.22  AC:      216.9  mm     G. Age:  26w 1d         33  %    FL/BPD:     79.9   %    71 - 87  FL:       49.8  mm     G. Age:  26w 6d         47  %    FL/AC:      23.0   %    20 - 24  LV:        4.3  mm  Est. FW:     926  gm      2 lb 1 oz     35  % ---------------------------------------------------------------------- OB History  Gravidity:    2         Term:   0        Prem:   0        SAB:   1  TOP:          0       Ectopic:  0        Living: 0 ---------------------------------------------------------------------- Gestational Age  LMP:           26w 3d        Date:  07/17/18                 EDD:   04/23/19  U/S Today:     26w 1d                                        EDD:   04/25/19  Best:          26w 3d     Det. By:  LMP  (07/17/18)          EDD:   04/23/19 ---------------------------------------------------------------------- Anatomy  Cranium:               Appears normal         LVOT:                   Previously seen  Cavum:                 Appears normal         Aortic Arch:  Previously seen  Ventricles:            Appears normal         Ductal Arch:            Previously seen  Choroid Plexus:        Previously seen         Diaphragm:              Appears normal  Cerebellum:            Previously seen        Stomach:                Appears normal, left                                                                        sided  Posterior Fossa:       Previously seen        Abdomen:                Appears normal  Nuchal Fold:           Not applicable ([id number]    Abdominal Wall:         Previously seen                         wks GA)  Face:                  Orbits and profile     Cord Vessels:           Appears normal (3                         previously seen                                vessel cord)  Lips:                  Previously seen        Kidneys:                Appear normal  Palate:                Previously seen        Bladder:                Appears normal  Thoracic:              Appears normal         Spine:                  Previously seen                         Previously seen  Heart:                 Appears normal         Upper Extremities:      Previously seen                         (  4CH, axis, and                         situs)  RVOT:                  Previously seen        Lower Extremities:      Previously seen  Other:  Heels/feet and hands/5th digit previously visualized. ---------------------------------------------------------------------- Cervix Uterus Adnexa  Cervix  Not visualized (advanced GA >24wks)  Uterus  No abnormality visualized.  Adnexa  No abnormality visualized. ---------------------------------------------------------------------- Comments  This patient was seen for a follow up growth scan due to  maternal HIV disease.  She denies any problems since her  last exam.  The patient will have another viral load drawn in  the next 2 weeks.  She was informed that the fetal growth and amniotic fluid  level appears appropriate for her gestational age.  A follow up exam was scheduled in 5 weeks. ----------------------------------------------------------------------                   Johnell Comings, MD  Electronically Signed Final Report   01/18/2019 11:14 am ----------------------------------------------------------------------   Assessment and Plan:  Pregnancy: G2P0010 at [redacted]w[redacted]d 1. Supervision of high risk pregnancy, antepartum 2. HIV affecting pregnancy, antepartum Will follow up viral load closer to delivery, last one about two weeks ago was 4K. Explained that vaginal delivery is only attempted for VL <1K. Continue growth scans as per MFM.  Preterm labor symptoms and general obstetric precautions including but not limited to vaginal bleeding, contractions, leaking of fluid and fetal movement were reviewed in detail with the patient. I discussed the assessment and treatment plan with the patient. The patient was provided an opportunity to ask questions and all were answered. The patient agreed with the plan and demonstrated an understanding of the instructions. The patient was advised to call back or seek an in-person office evaluation/go to MAU at [facility] for any urgent or concerning symptoms. Please refer to After Visit Summary for other counseling recommendations.   I provided 10 minutes of face-to-face time during this encounter.  Return in about 2 weeks (around 03/01/2019) for Virtual [facility] Visit also two weeks later, the same//6 weeks: OFFICE HOB Visit, Pelvic cultures.  Future Appointments  Date Time Provider [place]  02/18/2019 11:15 AM [place] Callas, NP RCID-RCID RCID  02/22/2019  9:45 AM [place] 2 WH-MFCUS MFC-US  02/22/2019  9:50 AM [place] NURSE WH-MFC MFC-US    Verita Schneiders, MD [facility]

## 2019-02-18 ENCOUNTER — Other Ambulatory Visit: Payer: Self-pay

## 2019-02-18 ENCOUNTER — Ambulatory Visit (INDEPENDENT_AMBULATORY_CARE_PROVIDER_SITE_OTHER): Payer: Medicaid Other | Admitting: Infectious Diseases

## 2019-02-18 ENCOUNTER — Encounter: Payer: Self-pay | Admitting: Infectious Diseases

## 2019-02-18 VITALS — BP 127/90 | HR 88 | Temp 98.0°F | Ht 62.0 in | Wt 151.0 lb

## 2019-02-18 DIAGNOSIS — B20 Human immunodeficiency virus [HIV] disease: Secondary | ICD-10-CM | POA: Diagnosis not present

## 2019-02-18 NOTE — Patient Instructions (Signed)
Will run a special test to make sure the medicines you are taking are OK to continue.   In the mean time please continue your Tivicay and Truvada every single day.   It is growing more and more important to get every dose in as we near your delivery date.

## 2019-02-18 NOTE — Progress Notes (Signed)
Name: Kimberly Bishop  DOB: Dec 14, 1990 MRN: [id number] PCP: Patient, No Pcp Per  OB/GYN: [name], MD   Patient Active Problem List   Diagnosis Date Noted  . Rubella nonimmune status, delivered, current hospitalization 02/15/2019  . Hematuria, gross 01/08/2019  . Diastasis recti 12/06/2018  . CIN I (cervical intraepithelial neoplasia I) 11/28/2018  . ASCUS with positive high risk HPV cervical 11/26/2018  . Supervision of high risk pregnancy, antepartum 10/02/2018  . Encounter for long-term (current) use of high-risk medication 03/28/2017  . Molluscum contagiosum 03/22/2016  . At risk for opportunistic infections 03/22/2016  . HIV affecting pregnancy, antepartum 07/03/2012     Brief Narrative:  Kimberly Bishop  is a 28 y.o. female  with HIV disease. CD4 nadir 90, likely less unrecorded VL she has never been undetectable HIV Risk: Perinatal History of OIs: Intake Labs 2013: Hep B sAg (-), sAb (-), cAb (-); Hep A (-), Hep C (-) Quantiferon () HLA B*5701 (-) G6PD: ()   Previous Regimens: . Tivicay + Odefsey . Biktarvy . Tivicay + Truvada (pregnancy 10-2018)  Genotypes: . 05/2018: wildtype virus   Subjective:  CC: HIV follow up care in pregnant female. Currently in second trimester, [redacted]w[redacted]d  EDD February 9th, 2021. She is having a daughter, named Dream.     HPI: She has had a significant improvement in her nausea and vomiting now that she is in the second trimester. She has for the last 4 weeks now been getting on average 4 doses of her HIV medication in per week. She states that it makes her "feel funny" which is why she takes it at night after dinner. She feels as if she needs to take it with food because that is how she has always taken her medicines in the past. She cites the reason for not having 100% adherence to be related to forgetting her medication.   She had a pap smear in July with OB/GYN team - read as ASCUS with +HPV. She is due to have a  colposcopy tomorrow 11/27/18. Recently treated with antibiotic for +urine cultures (Ecoli). Reports +fetal movement.   Depression screen PHQ 2/9 02/18/2019  Decreased Interest 0  Down, Depressed, Hopeless 0  PHQ - 2 Score 0  Altered sleeping -  Tired, decreased energy -  Change in appetite -  Feeling bad or failure about yourself  -  Trouble concentrating -  Moving slowly or fidgety/restless -  Suicidal thoughts -  PHQ-9 Score -  Some recent data might be hidden    Review of Systems  All other systems reviewed and are negative.   Past Medical History:  Diagnosis Date  . 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 History of kidney stones   . HIV (human immunodeficiency virus infection) ([facility]   . Immune deficiency disorder ([facility]     Outpatient Medications Prior to Visit  Medication Sig Dispense Refill  . Blood Pressure Monitoring DEVI 1 Device by Does not apply route once a week. ICD 10: O09.90 1 Device 0  . dolutegravir (TIVICAY) 50 MG tablet Take 1 tablet (50 mg total) by mouth daily. 30 tablet 11  . emtricitabine-tenofovir (TRUVADA) 200-300 MG tablet Take 1 tablet by mouth daily. 30 tablet 11  . Prenatal MV-Min-FA-Omega-3 (PRENATAL GUMMIES/DHA & FA) 0.4-32.5 MG CHEW Chew 2 tablets by mouth daily.     No facility-administered medications prior to visit.      No Known Allergies  Social History   Tobacco  Use  . Smoking status: Never Smoker  . Smokeless tobacco: Never Used  Substance Use Topics  . Alcohol use: Not Currently    Comment: occasional - last use 08/2018  . Drug use: Yes    Types: Marijuana    Comment: last use 08/24/18    Family History  Adopted: Yes  Problem Relation Age of Onset  . Hypertension Mother   . Autism Brother     Social History   Substance and Sexual Activity  Sexual Activity Yes  . Partners: Male  . Birth control/protection: None     Objective:   Vitals:   02/18/19 1126  BP: 127/90  Pulse: 88  Temp: 98  F (36.7 C)  Weight: 151 lb (68.5 kg)  Height: '5\' 2"'  (1.575 m)   Body mass index is 27.62 kg/m.  Physical Exam Constitutional:      Comments: Seated comfortably in the chair in no distress.  HENT:     Mouth/Throat:     Mouth: Mucous membranes are moist.     Pharynx: Oropharynx is clear. No oropharyngeal exudate.  Cardiovascular:     Rate and Rhythm: Normal rate and regular rhythm.  Pulmonary:     Effort: Pulmonary effort is normal.     Breath sounds: Normal breath sounds.  Abdominal:     Comments: Pregnant  Skin:    Capillary Refill: Capillary refill takes less than 2 seconds.  Neurological:     Mental Status: She is oriented to person, place, and time.  Psychiatric:     Comments: Affect seems flat today.     Lab Results Lab Results  Component Value Date   WBC 8.6 01/31/2019   HGB 9.9 (L) 01/31/2019   HCT 29.9 (L) 01/31/2019   MCV 88 01/31/2019   PLT 254 01/31/2019    Lab Results  Component Value Date   CREATININE 0.85 05/25/2018   BUN 11 05/25/2018   NA 140 05/25/2018   K 4.2 05/25/2018   CL 107 05/25/2018   CO2 26 05/25/2018    Lab Results  Component Value Date   ALT 26 05/25/2018   AST 21 05/25/2018   ALKPHOS 97 11/03/2016   BILITOT 0.3 05/25/2018    Lab Results  Component Value Date   CHOL 177 03/28/2017   HDL 44 (L) 03/28/2017   LDLCALC 119 (H) 03/28/2017   TRIG 58 03/28/2017   CHOLHDL 4.0 03/28/2017   HIV 1 RNA Quant (copies/mL)  Date Value  02/04/2019 4,750 (H)  11/26/2018 1,190 (H)  10/02/2018 9,960 (H)   CD4 T Cell Abs (/uL)  Date Value  02/04/2019 68 (L)  10/03/2018 94 (L)  08/27/2018 90 (L)     Assessment & Plan:   HIV, uncontrolled. Antepartum Care Check viral load today Back in 4 weeks for 36w check to help determine mode of delivery Counseling for adherence today. She is hopefully doing better.     I spent 15 minutes with the patient in face-to-face consultation and counseling of all the points detailed above.   Janene Madeira, MSN, NP-C [facility] for [facility] [place] Pager: [phone] Office: [phone]  02/18/19  11:54 AM

## 2019-02-18 NOTE — Progress Notes (Signed)
CD4 percentage may be better, but her viral load is unfortunately worse. :(

## 2019-02-22 ENCOUNTER — Other Ambulatory Visit: Payer: Self-pay

## 2019-02-22 ENCOUNTER — Other Ambulatory Visit: Payer: Self-pay | Admitting: Lactation Services

## 2019-02-22 ENCOUNTER — Ambulatory Visit (HOSPITAL_COMMUNITY): Payer: Medicaid Other | Admitting: *Deleted

## 2019-02-22 ENCOUNTER — Ambulatory Visit (HOSPITAL_COMMUNITY)
Admission: RE | Admit: 2019-02-22 | Discharge: 2019-02-22 | Disposition: A | Discharge status: Home / Self Care | Payer: Medicaid Other | Source: Ambulatory Visit | Attending: Obstetrics | Admitting: Obstetrics

## 2019-02-22 ENCOUNTER — Other Ambulatory Visit (HOSPITAL_COMMUNITY): Payer: Self-pay | Admitting: *Deleted

## 2019-02-22 ENCOUNTER — Encounter (HOSPITAL_COMMUNITY): Payer: Self-pay

## 2019-02-22 DIAGNOSIS — Z283 Underimmunization status: Secondary | ICD-10-CM | POA: Diagnosis present

## 2019-02-22 DIAGNOSIS — O0990 Supervision of high risk pregnancy, unspecified, unspecified trimester: Secondary | ICD-10-CM

## 2019-02-22 DIAGNOSIS — Z362 Encounter for other antenatal screening follow-up: Secondary | ICD-10-CM

## 2019-02-22 DIAGNOSIS — R310 Gross hematuria: Secondary | ICD-10-CM

## 2019-02-22 DIAGNOSIS — M6208 Separation of muscle (nontraumatic), other site: Secondary | ICD-10-CM | POA: Insufficient documentation

## 2019-02-22 DIAGNOSIS — O98712 Human immunodeficiency virus [HIV] disease complicating pregnancy, second trimester: Secondary | ICD-10-CM

## 2019-02-22 DIAGNOSIS — Z3A31 31 weeks gestation of pregnancy: Secondary | ICD-10-CM

## 2019-02-22 DIAGNOSIS — O99892 Other specified diseases and conditions complicating childbirth: Secondary | ICD-10-CM | POA: Insufficient documentation

## 2019-02-22 DIAGNOSIS — N870 Mild cervical dysplasia: Secondary | ICD-10-CM

## 2019-02-22 DIAGNOSIS — O98713 Human immunodeficiency virus [HIV] disease complicating pregnancy, third trimester: Secondary | ICD-10-CM

## 2019-02-22 DIAGNOSIS — O98719 Human immunodeficiency virus [HIV] disease complicating pregnancy, unspecified trimester: Secondary | ICD-10-CM | POA: Insufficient documentation

## 2019-02-22 DIAGNOSIS — Z2839 Other underimmunization status: Secondary | ICD-10-CM

## 2019-02-22 MED ORDER — COMFORT FIT MATERNITY SUPP MED MISC: 1.0000 | Freq: Once | 0 refills | Status: AC

## 2019-02-28 ENCOUNTER — Encounter: Payer: Self-pay | Admitting: *Deleted

## 2019-03-03 LAB — HIV RNA, RTPCR W/R GT (RTI, PI,INT)
HIV 1 RNA Quant: 1480 copies/mL — ABNORMAL HIGH
HIV-1 RNA Quant, Log: 3.17 Log copies/mL — ABNORMAL HIGH

## 2019-03-03 LAB — HIV-1 INTEGRASE GENOTYPE

## 2019-03-03 LAB — HIV-1 GENOTYPE: HIV-1 Genotype: DETECTED — AB

## 2019-03-03 NOTE — Progress Notes (Signed)
Better but still not < 1000 copies. Will contact her next week to see how she is doing with adherence. Next lab appt in early January.

## 2019-03-04 ENCOUNTER — Other Ambulatory Visit: Payer: Self-pay

## 2019-03-04 ENCOUNTER — Telehealth (INDEPENDENT_AMBULATORY_CARE_PROVIDER_SITE_OTHER): Payer: Medicaid Other | Admitting: Family Medicine

## 2019-03-04 ENCOUNTER — Encounter: Payer: Self-pay | Admitting: Family Medicine

## 2019-03-04 DIAGNOSIS — O0993 Supervision of high risk pregnancy, unspecified, third trimester: Secondary | ICD-10-CM

## 2019-03-04 DIAGNOSIS — O98713 Human immunodeficiency virus [HIV] disease complicating pregnancy, third trimester: Secondary | ICD-10-CM

## 2019-03-04 DIAGNOSIS — O0990 Supervision of high risk pregnancy, unspecified, unspecified trimester: Secondary | ICD-10-CM

## 2019-03-04 DIAGNOSIS — O99892 Other specified diseases and conditions complicating childbirth: Secondary | ICD-10-CM | POA: Diagnosis not present

## 2019-03-04 DIAGNOSIS — Z3A32 32 weeks gestation of pregnancy: Secondary | ICD-10-CM

## 2019-03-04 DIAGNOSIS — N870 Mild cervical dysplasia: Secondary | ICD-10-CM

## 2019-03-04 DIAGNOSIS — O98719 Human immunodeficiency virus [HIV] disease complicating pregnancy, unspecified trimester: Secondary | ICD-10-CM

## 2019-03-04 DIAGNOSIS — Z283 Underimmunization status: Secondary | ICD-10-CM

## 2019-03-04 NOTE — Patient Instructions (Signed)
Preterm Labor and Birth Information Pregnancy normally lasts 39-41 weeks. Preterm labor is when labor starts early. It starts before you have been pregnant for 37 whole weeks. What are the risk factors for preterm labor? Preterm labor is more likely to occur in women who:  Have an infection while pregnant.  Have a cervix that is short.  Have gone into preterm labor before.  Have had surgery on their cervix.  Are younger than age 28.  Are older than age 59.  Are African American.  Are pregnant with two or more babies.  Take street drugs while pregnant.  Smoke while pregnant.  Do not gain enough weight while pregnant.  Got pregnant right after another pregnancy. What are the symptoms of preterm labor? Symptoms of preterm labor include:  Cramps. The cramps may feel like the cramps some women get during their period. The cramps may happen with watery poop (diarrhea).  Pain in the belly (abdomen).  Pain in the lower back.  Regular contractions or tightening. It may feel like your belly is getting tighter.  Pressure in the lower belly that seems to get stronger.  More fluid (discharge) leaking from the vagina. The fluid may be watery or bloody.  Water breaking. Why is it important to notice signs of preterm labor? Babies who are born early may not be fully developed. They have a higher chance for:  Long-term heart problems.  Long-term lung problems.  Trouble controlling body systems, like breathing.  Bleeding in the brain.  A condition called cerebral palsy.  Learning difficulties.  Death. These risks are highest for babies who are born before 73 weeks of pregnancy. How is preterm labor treated? Treatment depends on:  How long you were pregnant.  Your condition.  The health of your baby. Treatment may involve:  Having a stitch (suture) placed in your cervix. When you give birth, your cervix opens so the baby can come out. The stitch keeps the cervix  from opening too soon.  Staying at the hospital.  Taking or getting medicines, such as: ? Hormone medicines. ? Medicines to stop contractions. ? Medicines to help the baby's lungs develop. ? Medicines to prevent your baby from having cerebral palsy. What should I do if I am in preterm labor? If you think you are going into labor too soon, call your doctor right away. How can I prevent preterm labor?  Do not use any tobacco products. ? Examples of these are cigarettes, chewing tobacco, and e-cigarettes. ? If you need help quitting, ask your doctor.  Do not use street drugs.  Do not use any medicines unless you ask your doctor if they are safe for you.  Talk with your doctor before taking any herbal supplements.  Make sure you gain enough weight.  Watch for infection. If you think you might have an infection, get it checked right away.  If you have gone into preterm labor before, tell your doctor. This information is not intended to replace advice given to you by your health care provider. Make sure you discuss any questions you have with your health care provider. Document Released: 05/27/2008 Document Revised: 06/22/2018 Document Reviewed: 07/22/2015 Elsevier Patient Education  2020 [employer].

## 2019-03-04 NOTE — Progress Notes (Signed)
I connected with  [place] on 03/04/19 at  4:15 PM EST by telephone and verified that I am speaking with the correct person using two identifiers.   I discussed the limitations, risks, security and privacy concerns of performing an evaluation and management service by telephone and the availability of in person appointments. I also discussed with the patient that there may be a patient responsible charge related to this service. The patient expressed understanding and agreed to proceed.  Verdell Carmine, RN 03/04/2019  3:46 PM

## 2019-03-04 NOTE — Progress Notes (Signed)
   TELEHEALTH VIRTUAL OBSTETRICS VISIT ENCOUNTER NOTE  I connected with Kimberly Bishop on 03/04/19 at  4:15 PM EST by telephone at home and verified that I am speaking with the correct person using two identifiers.   I discussed the limitations, risks, security and privacy concerns of performing an evaluation and management service by telephone and the availability of in person appointments. I also discussed with the patient that there may be a patient responsible charge related to this service. The patient expressed understanding and agreed to proceed.  Subjective:  Kimberly Bishop is a 28 y.o. G2P0010 at 71w6dbeing followed for ongoing prenatal care.  She is currently monitored for the following issues for this high-risk pregnancy and has HIV affecting pregnancy, antepartum; Molluscum contagiosum; At risk for opportunistic infections; Encounter for long-term (current) use of high-risk medication; Supervision of high risk pregnancy, antepartum; ASCUS with positive high risk HPV cervical; CIN I (cervical intraepithelial neoplasia I); Diastasis recti; Hematuria, gross; and Rubella nonimmune status, delivered, current hospitalization on their problem list.  Patient reports no complaints. Reports fetal movement. Denies any contractions, bleeding or leaking of fluid.   The following portions of the patient's history were reviewed and updated as appropriate: allergies, current medications, past family history, past medical history, past social history, past surgical history and problem list.   Objective:   General:  Alert, oriented and cooperative.   Mental Status: Normal mood and affect perceived. Normal judgment and thought content.  Rest of physical exam deferred due to type of encounter  Assessment and Plan:  Pregnancy: G2P0010 at 321w6d1. Supervision of high risk pregnancy, antepartum - Continue routine prenatal care - US[place]2/11 shows normal growth - Continue US[place]oreaer MFM  2. HIV  affecting pregnancy, antepartum - Follows with infectious disease (SJanene MadeiraNP) - on Tivicay and Truvada - latest viral load 1,480 - discussed vaginal delivery only attempted if viral load <1,000 - next lab draw is 03/26/19 - continue with US[place]oreaer MFM  3.  CIN I (cervical intraepithelial neoplasia I) - repeat Pap postpartum  4. Rubella nonimmune status, delivered, current hospitalization - will need MMR post partum  Preterm labor symptoms and general obstetric precautions including but not limited to vaginal bleeding, contractions, leaking of fluid and fetal movement were reviewed in detail with the patient.  I discussed the assessment and treatment plan with the patient. The patient was provided an opportunity to ask questions and all were answered. The patient agreed with the plan and demonstrated an understanding of the instructions. The patient was advised to call back or seek an in-person office evaluation/[facility] any [facility] or concerning [facility]. Please refer to After Visit Summary for other counseling recommendations.   I provided 8 minutes of non-face-to-face time during this encounter.  No follow-ups on file.  Future Appointments  Date Time Provider DeAmador1/06/2019  9:15 AM StJacob MooresOGe[facility]trOW[facility]ren1/02/2020  8:15 AM WHFargoSKorea WH-MFCUS MFC-US  03/26/2019  8:20 AM WH-MFC NURSE WH-MFC MFC-US  03/26/2019 11:15 AM DiRaleigh CallasNP RCID-RCID RCID  04/01/2019  9:35 AM StNehemiah SettleJaTanna SavoyDO WOC-WOCA WOMartinor WoDean Foods CompanyCoHalfwayroup

## 2019-03-18 ENCOUNTER — Telehealth (INDEPENDENT_AMBULATORY_CARE_PROVIDER_SITE_OTHER): Payer: Medicaid Other | Admitting: Family Medicine

## 2019-03-18 VITALS — BP 134/81 | HR 72

## 2019-03-18 DIAGNOSIS — O98713 Human immunodeficiency virus [HIV] disease complicating pregnancy, third trimester: Secondary | ICD-10-CM

## 2019-03-18 DIAGNOSIS — Z2839 Other underimmunization status: Secondary | ICD-10-CM

## 2019-03-18 DIAGNOSIS — O0993 Supervision of high risk pregnancy, unspecified, third trimester: Secondary | ICD-10-CM

## 2019-03-18 DIAGNOSIS — O99892 Other specified diseases and conditions complicating childbirth: Secondary | ICD-10-CM | POA: Diagnosis not present

## 2019-03-18 DIAGNOSIS — M6208 Separation of muscle (nontraumatic), other site: Secondary | ICD-10-CM

## 2019-03-18 DIAGNOSIS — R310 Gross hematuria: Secondary | ICD-10-CM | POA: Diagnosis not present

## 2019-03-18 DIAGNOSIS — N870 Mild cervical dysplasia: Secondary | ICD-10-CM

## 2019-03-18 DIAGNOSIS — O98719 Human immunodeficiency virus [HIV] disease complicating pregnancy, unspecified trimester: Secondary | ICD-10-CM

## 2019-03-18 DIAGNOSIS — Z283 Underimmunization status: Secondary | ICD-10-CM

## 2019-03-18 DIAGNOSIS — O0990 Supervision of high risk pregnancy, unspecified, unspecified trimester: Secondary | ICD-10-CM

## 2019-03-18 DIAGNOSIS — Z3A34 34 weeks gestation of pregnancy: Secondary | ICD-10-CM

## 2019-03-18 NOTE — Progress Notes (Signed)
[place] VIRTUAL VIDEO VISIT ENCOUNTER NOTE  Provider location: [facility] [place]   I connected with [place] on 03/18/19 at  9:15 AM EST by MyChart Video Encounter at home and verified that I am speaking with the correct person using two identifiers.   I discussed the limitations, risks, security and privacy concerns of performing an evaluation and management service virtually and the availability of in person appointments. I also discussed with the patient that there may be a patient responsible charge related to this service. The patient expressed understanding and agreed to proceed. Subjective:  Kimberly Bishop is a 29 y.o. G2P0010 at 81w6dbeing seen today for ongoing prenatal care.  She is currently monitored for the following issues for this high-risk pregnancy and has HIV affecting pregnancy, antepartum; Molluscum contagiosum; At risk for opportunistic infections; Encounter for long-term (current) use of high-risk medication; Supervision of high risk pregnancy, antepartum; ASCUS with positive high risk HPV cervical; CIN I (cervical intraepithelial neoplasia I); Diastasis recti; Hematuria, gross; and Rubella nonimmune status, delivered, current hospitalization on their problem list.  Patient reports occasional sharp vaginal pains, coorlates with fetal movement.  Contractions: Not present. Vag. Bleeding: None.  Movement: Present. Denies any leaking of fluid.   The following portions of the patient's history were reviewed and updated as appropriate: allergies, current medications, past family history, past medical history, past social history, past surgical history and problem list.   Objective:   Vitals:   03/18/19 0919  BP: 134/81  Pulse: 72    Fetal Status:     Movement: Present     General:  Alert, oriented and cooperative. Patient is in no acute distress.  Respiratory: Normal respiratory effort, no problems with  respiration noted  Mental Status: Normal mood and affect. Normal behavior. Normal judgment and thought content.  Rest of physical exam deferred due to type of encounter  Imaging: UKoreaMFM OB FOLLOW UP  Result Date: 02/22/2019 ----------------------------------------------------------------------  OBSTETRICS REPORT                       (Signed Final 02/22/2019 11:36 am) ---------------------------------------------------------------------- Patient Info  ID #:       [id number]                         D.O.B.:  1992(29 yrs)  Name:       Kimberly Bishop            Visit Date: 02/22/2019 09:58 am ---------------------------------------------------------------------- Performed By  Performed By:     HValda Favia         Ref. Address:     [address]  [place], [place]  Attending:        Johnell Comings MD         [facility]  Referred By:      Osborne Oman MD ---------------------------------------------------------------------- Orders   #  Description                          Code         Ordered By   1  [place] MFM OB FOLLOW UP                  47096.28     [place]  ----------------------------------------------------------------------   #  Order #                    Accession #                 Episode #   1  [id number]                  [id number]                  [id number]  ---------------------------------------------------------------------- Indications   HIV affecting pregnancy, second trimester      O98.712   (Truvada, Tivicay)   Encounter for other antenatal screening        Z36.2   follow-up (low risk NIPS, neg AFP)   [redacted] weeks gestation of pregnancy                Z3A.31   ---------------------------------------------------------------------- Fetal Evaluation  Num Of Fetuses:         1  Cardiac Activity:       Observed  Presentation:           Cephalic  Placenta:               Anterior  P. Cord Insertion:      Previously Visualized  Amniotic Fluid  AFI FV:      Within normal limits  AFI Sum(cm)     %Tile       Largest Pocket(cm)  10.37           18          4.56  RUQ(cm)       RLQ(cm)       LUQ(cm)        LLQ(cm)  4.56          3.19          0              2.62 ----------------------------------------------------------------------  Biometry  BPD:      74.9  mm     G. Age:  30w 0d          8  %    CI:        74.66   %    70 - 86                                                          FL/HC:      21.6   %    19.3 - 21.3  HC:      275.1  mm     G. Age:  30w 0d          2  %    HC/AC:      1.06        0.96 - 1.17  AC:      259.2  mm     G. Age:  30w 1d         13  %    FL/BPD:     79.3   %    71 - 87  FL:       59.4  mm     G. Age:  31w 0d         24  %    FL/AC:      22.9   %    20 - 24  HUM:        54  mm     G. Age:  31w 3d         52  %  Est. FW:    1557  gm      3 lb 7 oz     12  % ---------------------------------------------------------------------- OB History  Gravidity:    2         Term:   0        Prem:   0        SAB:   1  TOP:          0       Ectopic:  0        Living: 0 ---------------------------------------------------------------------- Gestational Age  LMP:           31w 3d        Date:  07/17/18                 EDD:   04/23/19  U/S Today:     30w 2d                                        EDD:   05/01/19  Best:          31w 3d     Det. By:  LMP  (07/17/18)          EDD:   04/23/19 ---------------------------------------------------------------------- Anatomy  Cranium:               Appears normal         LVOT:                   Previously seen  Cavum:  Previously seen        Aortic Arch:            Previously seen  Ventricles:            Previously seen         Ductal Arch:            Previously seen  Choroid Plexus:        Previously seen        Diaphragm:              Previously seen  Cerebellum:            Previously seen        Stomach:                Appears normal, left                                                                        sided  Posterior Fossa:       Previously seen        Abdomen:                Previously seen  Nuchal Fold:           Not applicable (>46    Abdominal Wall:         Previously seen                         wks GA)  Face:                  Orbits and profile     Cord Vessels:           Previously seen                         previously seen  Lips:                  Previously seen        Kidneys:                Appear normal  Palate:                Previously seen        Bladder:                Appears normal  Thoracic:              Appears normal         Spine:                  Previously seen  Heart:                 Previously seen        Upper Extremities:      Previously seen  RVOT:                  Previously seen        Lower Extremities:      Previously seen  Other:  Heels/feet and hands/5th digit previously visualized. ---------------------------------------------------------------------- Cervix Uterus Adnexa  Cervix  Not visualized (advanced GA >24wks)  Uterus  No abnormality visualized.  Left Ovary  No adnexal mass visualized.  Right Ovary  No adnexal mass visualized.  Cul De Sac  No free fluid seen.  Adnexa  No abnormality visualized. ---------------------------------------------------------------------- Comments  This patient was seen for a follow up growth scan due to  maternal HIV disease.  The patient's most recent HIV viral  load two weeks ago was 4750.  She is currently being treated  with antiretroviral medications.  She was informed that the fetal growth and amniotic fluid  level appears appropriate for her gestational age.  Should her HIV viral load continue to be greater than 1000, a  cesarean delivery should  be scheduled at 38 weeks to  decrease the risk of perinatal HIV transmission.  A follow up exam was scheduled in 4 weeks. ----------------------------------------------------------------------                   Johnell Comings, MD Electronically Signed Final Report   02/22/2019 11:36 am ----------------------------------------------------------------------   Assessment and Plan:  Pregnancy: G2P0010 at 60w6d1. Rubella nonimmune status, delivered, current hospitalization MMR post delivery  2. Hematuria, gross   3. Diastasis recti   4. CIN I (cervical intraepithelial neoplasia I) Needs PAP postpartum  5. Supervision of high risk pregnancy, antepartum   6. HIV affecting pregnancy, antepartum Growth UKoreaon 1/12 Compliant with meds. Rpt labs on 1/12.  Preterm labor symptoms and general obstetric precautions including but not limited to vaginal bleeding, contractions, leaking of fluid and fetal movement were reviewed in detail with the patient. I discussed the assessment and treatment plan with the patient. The patient was provided an opportunity to ask questions and all were answered. The patient agreed with the plan and demonstrated an understanding of the instructions. The patient was advised to call back or seek an in-person office evaluation/[facility] or concerning [facility]. Please refer to After Visit Summary for other counseling recommendations.   I provided 9 minutes of face-to-face time during this encounter.  Return in about 2 weeks (around 04/01/2019) for HR OB f/u, In Office.  Future Appointments  Date Time Provider DColumbia 03/26/2019  8:15 AM WSpring ValleyUKorea4 WH-MFCUS MFC-US  03/26/2019  8:20 AM WLa SalleNURSE WCoronaMFC-US  03/26/2019 11:15 AM DRaleigh Callas NP RCID-RCID RCID  04/01/2019  9:35 AM STruett Mainland DO WOC-WOCA WWindomfor WDean Foods Company CBloomfield

## 2019-03-18 NOTE — Progress Notes (Signed)
I connected with  [place] on 03/18/19 at  9:15 AM EST by telephone and verified that I am speaking with the correct person using two identifiers.   I discussed the limitations, risks, security and privacy concerns of performing an evaluation and management service by telephone and the availability of in person appointments. I also discussed with the patient that there may be a patient responsible charge related to this service. The patient expressed understanding and agreed to proceed.  Cl[place], [place] 03/18/2019  9:17 AM

## 2019-03-25 ENCOUNTER — Telehealth: Payer: Self-pay

## 2019-03-25 NOTE — Telephone Encounter (Signed)
COVID-19 Pre-Screening Questions:03/25/2019  Do you currently have a fever (>100 F), chills or unexplained body aches?NO  Are you currently experiencing new cough, shortness of breath, sore throat, runny nose? NO  .  Have you recently travelled outside the state of [place] in the last 14 days? NO .  Have you been in contact with someone that is currently pending confirmation of Covid19 testing or has been confirmed to have the [place] virus?  NO   **If the patient answers NO to ALL questions -  advise the patient to please call the clinic before coming to the office should any symptoms develop.

## 2019-03-26 ENCOUNTER — Inpatient Hospital Stay (HOSPITAL_COMMUNITY)
Admission: AD | Admit: 2019-03-26 | Discharge: 2019-03-27 | DRG: 832 | Disposition: A | Discharge status: Home / Self Care | Payer: Medicaid Other | Attending: Obstetrics and Gynecology | Admitting: Obstetrics and Gynecology

## 2019-03-26 ENCOUNTER — Ambulatory Visit (HOSPITAL_COMMUNITY): Payer: Medicaid Other | Admitting: *Deleted

## 2019-03-26 ENCOUNTER — Other Ambulatory Visit (HOSPITAL_COMMUNITY): Payer: Self-pay | Admitting: Obstetrics

## 2019-03-26 ENCOUNTER — Encounter: Payer: Self-pay | Admitting: Infectious Diseases

## 2019-03-26 ENCOUNTER — Ambulatory Visit (HOSPITAL_BASED_OUTPATIENT_CLINIC_OR_DEPARTMENT_OTHER)
Admission: RE | Admit: 2019-03-26 | Discharge: 2019-03-26 | Disposition: A | Discharge status: Home / Self Care | Payer: Medicaid Other | Source: Ambulatory Visit | Attending: Obstetrics and Gynecology | Admitting: Obstetrics and Gynecology

## 2019-03-26 ENCOUNTER — Other Ambulatory Visit: Payer: Self-pay

## 2019-03-26 ENCOUNTER — Encounter (HOSPITAL_COMMUNITY): Payer: Self-pay

## 2019-03-26 ENCOUNTER — Ambulatory Visit (INDEPENDENT_AMBULATORY_CARE_PROVIDER_SITE_OTHER): Payer: Medicaid Other | Admitting: Infectious Diseases

## 2019-03-26 VITALS — BP 123/78 | HR 80 | Wt 155.0 lb

## 2019-03-26 DIAGNOSIS — Z283 Underimmunization status: Secondary | ICD-10-CM

## 2019-03-26 DIAGNOSIS — D649 Anemia, unspecified: Secondary | ICD-10-CM | POA: Diagnosis present

## 2019-03-26 DIAGNOSIS — O365931 Maternal care for other known or suspected poor fetal growth, third trimester, fetus 1: Secondary | ICD-10-CM | POA: Diagnosis not present

## 2019-03-26 DIAGNOSIS — M6208 Separation of muscle (nontraumatic), other site: Secondary | ICD-10-CM

## 2019-03-26 DIAGNOSIS — O99013 Anemia complicating pregnancy, third trimester: Secondary | ICD-10-CM | POA: Diagnosis present

## 2019-03-26 DIAGNOSIS — O4103X0 Oligohydramnios, third trimester, not applicable or unspecified: Secondary | ICD-10-CM

## 2019-03-26 DIAGNOSIS — B20 Human immunodeficiency virus [HIV] disease: Secondary | ICD-10-CM | POA: Diagnosis present

## 2019-03-26 DIAGNOSIS — O133 Gestational [pregnancy-induced] hypertension without significant proteinuria, third trimester: Secondary | ICD-10-CM | POA: Diagnosis present

## 2019-03-26 DIAGNOSIS — Z3A36 36 weeks gestation of pregnancy: Secondary | ICD-10-CM

## 2019-03-26 DIAGNOSIS — Z20822 Contact with and (suspected) exposure to covid-19: Secondary | ICD-10-CM | POA: Diagnosis present

## 2019-03-26 DIAGNOSIS — O09299 Supervision of pregnancy with other poor reproductive or obstetric history, unspecified trimester: Secondary | ICD-10-CM | POA: Diagnosis present

## 2019-03-26 DIAGNOSIS — O99891 Other specified diseases and conditions complicating pregnancy: Secondary | ICD-10-CM | POA: Diagnosis present

## 2019-03-26 DIAGNOSIS — O4100X0 Oligohydramnios, unspecified trimester, not applicable or unspecified: Secondary | ICD-10-CM | POA: Diagnosis present

## 2019-03-26 DIAGNOSIS — Z2839 Other underimmunization status: Secondary | ICD-10-CM

## 2019-03-26 DIAGNOSIS — Z362 Encounter for other antenatal screening follow-up: Secondary | ICD-10-CM | POA: Diagnosis not present

## 2019-03-26 DIAGNOSIS — O98713 Human immunodeficiency virus [HIV] disease complicating pregnancy, third trimester: Secondary | ICD-10-CM

## 2019-03-26 DIAGNOSIS — Z8759 Personal history of other complications of pregnancy, childbirth and the puerperium: Secondary | ICD-10-CM | POA: Diagnosis present

## 2019-03-26 DIAGNOSIS — R310 Gross hematuria: Secondary | ICD-10-CM

## 2019-03-26 DIAGNOSIS — N870 Mild cervical dysplasia: Secondary | ICD-10-CM

## 2019-03-26 DIAGNOSIS — O0990 Supervision of high risk pregnancy, unspecified, unspecified trimester: Secondary | ICD-10-CM

## 2019-03-26 DIAGNOSIS — O365930 Maternal care for other known or suspected poor fetal growth, third trimester, not applicable or unspecified: Principal | ICD-10-CM

## 2019-03-26 DIAGNOSIS — O98719 Human immunodeficiency virus [HIV] disease complicating pregnancy, unspecified trimester: Secondary | ICD-10-CM

## 2019-03-26 LAB — CBC
HCT: 32.4 % — ABNORMAL LOW (ref 36.0–46.0)
Hemoglobin: 10.2 g/dL — ABNORMAL LOW (ref 12.0–15.0)
MCH: 25.9 pg — ABNORMAL LOW (ref 26.0–34.0)
MCHC: 31.5 g/dL (ref 30.0–36.0)
MCV: 82.2 fL (ref 80.0–100.0)
Platelets: 408 10*3/uL — ABNORMAL HIGH (ref 150–400)
RBC: 3.94 MIL/uL (ref 3.87–5.11)
RDW: 14.4 % (ref 11.5–15.5)
WBC: 8.9 10*3/uL (ref 4.0–10.5)
nRBC: 0 % (ref 0.0–0.2)

## 2019-03-26 LAB — ABO/RH: ABO/RH(D): O POS

## 2019-03-26 LAB — TYPE AND SCREEN
ABO/RH(D): O POS
Antibody Screen: NEGATIVE

## 2019-03-26 LAB — SARS CORONAVIRUS 2 (TAT 6-24 HRS): SARS Coronavirus 2: NEGATIVE

## 2019-03-26 MED ORDER — TIVICAY 50 MG PO TABS: 50.0000 mg | ORAL_TABLET | Freq: Every day | ORAL | 1 refills | Status: DC

## 2019-03-26 MED ORDER — CALCIUM CARBONATE ANTACID 500 MG PO CHEW: 2.0000 | CHEWABLE_TABLET | ORAL | Status: DC | PRN

## 2019-03-26 MED ORDER — PRENATAL MULTIVITAMIN CH
1.0000 | ORAL_TABLET | Freq: Every day | ORAL | Status: DC
  Administered 2019-03-26: 16:00:00 1 via ORAL
  Filled 2019-03-26: qty 1

## 2019-03-26 MED ORDER — EMTRICITABINE-TENOFOVIR DF 200-300 MG PO TABS: 1.0000 | ORAL_TABLET | Freq: Every day | ORAL | 1 refills | Status: DC

## 2019-03-26 MED ORDER — DOCUSATE SODIUM 100 MG PO CAPS: 100.0000 mg | ORAL_CAPSULE | Freq: Two times a day (BID) | ORAL | Status: DC | PRN

## 2019-03-26 MED ORDER — ZOLPIDEM TARTRATE 5 MG PO TABS
5.0000 mg | ORAL_TABLET | Freq: Every evening | ORAL | Status: DC | PRN
  Administered 2019-03-26: 22:00:00 5 mg via ORAL
  Filled 2019-03-26: qty 1

## 2019-03-26 MED ORDER — LACTATED RINGERS IV SOLN: INTRAVENOUS | Status: DC

## 2019-03-26 MED ORDER — EMTRICITABINE-TENOFOVIR DF 200-300 MG PO TABS
ORAL_TABLET | Freq: Every day | ORAL | Status: DC
  Administered 2019-03-26: 21:00:00 1 via ORAL
  Filled 2019-03-26: qty 1

## 2019-03-26 MED ORDER — LACTATED RINGERS IV BOLUS
1500.0000 mL | Freq: Once | INTRAVENOUS | Status: AC
  Administered 2019-03-26: 13:00:00 1500 mL via INTRAVENOUS

## 2019-03-26 MED ORDER — DOLUTEGRAVIR SODIUM 50 MG PO TABS
50.0000 mg | ORAL_TABLET | Freq: Every day | ORAL | Status: DC
  Administered 2019-03-26: 21:00:00 50 mg via ORAL
  Filled 2019-03-26 (×2): qty 1

## 2019-03-26 MED ORDER — ACETAMINOPHEN 325 MG PO TABS
650.0000 mg | ORAL_TABLET | ORAL | Status: DC | PRN
  Administered 2019-03-26: 21:00:00 650 mg via ORAL
  Filled 2019-03-26: qty 2

## 2019-03-26 MED ORDER — BETAMETHASONE SOD PHOS & ACET 6 (3-3) MG/ML IJ SUSP
12.0000 mg | INTRAMUSCULAR | Status: AC
  Administered 2019-03-26 – 2019-03-27 (×2): 12 mg via INTRAMUSCULAR
  Filled 2019-03-26: qty 5

## 2019-03-26 NOTE — H&P (Addendum)
Obstetrics Admission History & Physical  03/26/2019 - 1:18 PM Primary OBGYN: [facility]  Chief Complaint: new found FGR and oligo on u/s today  History of Present Illness  29 y.o. G2P0010 @ [redacted]w[redacted]d, with the above CC. Pregnancy complicated by: AIDS, anemia, CIN 1.  [name] states that she's doing well w/o any complaints or issues.   Review of Systems:  as noted in the History of Present Illness.  Patient Active Problem List   Diagnosis Date Noted  . Oligohydramnios antepartum 03/26/2019  . IUGR (intrauterine growth restriction) affecting care of mother 03/26/2019  . Oligohydramnios 03/26/2019  . Rubella nonimmune status, delivered, current hospitalization 02/15/2019  . Hematuria, gross 01/08/2019  . Diastasis recti 12/06/2018  . CIN I (cervical intraepithelial neoplasia I) 11/28/2018  . ASCUS with positive high risk HPV cervical 11/26/2018  . Supervision of high risk pregnancy, antepartum 10/02/2018  . Encounter for long-term (current) use of high-risk medication 03/28/2017  . Molluscum contagiosum 03/22/2016  . At risk for opportunistic infections 03/22/2016  . HIV affecting pregnancy, antepartum 07/03/2012     PMHx:  Past Medical History:  Diagnosis Date  . 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 History of kidney stones   . HIV (human immunodeficiency virus infection) ([place])   . Immune deficiency disorder [facility])    PSHx:  Past Surgical History:  Procedure Laterality Date  . APPENDECTOMY  2011  . CYSTOSCOPY W/ URETERAL STENT PLACEMENT Right 11/03/2016   Procedure: CYSTOSCOPY WITH RETROGRADE PYELOGRAM/URETERAL STENT PLACEMENT;  Surgeon: Kathie Rhodes, MD;  Location: [facility];  Service: Urology;  Laterality: Right;  . CYSTOSCOPY/URETEROSCOPY/HOLMIUM LASER/STENT PLACEMENT Right 01/16/2017   Procedure: CYSTOSCOPY/URETEROSCOPY/ STONE EXTRACTION/STENT removal;  Surgeon: Kathie Rhodes, MD;  Location: [facility];  Service:  Urology;  Laterality: Right;  . IR DIL URETER RIGHT  12/05/2016  . IR NEPHROSTOMY PLACEMENT RIGHT  11/07/2016  . IR URETERAL STENT PLACEMENT EXISTING ACCESS RIGHT  12/05/2016  . NEPHROLITHOTOMY Right 12/05/2016   Procedure: NEPHROLITHOTOMY PERCUTANEOUS;  Surgeon: Kathie Rhodes, MD;  Location: [facility];  Service: Urology;  Laterality: Right;   Medications:  Medications Prior to Admission  Medication Sig Dispense Refill Last Dose  . calcium carbonate (TUMS - DOSED IN MG ELEMENTAL CALCIUM) 500 MG chewable tablet Chew 2 tablets by mouth daily as needed for indigestion or heartburn.   Past Week at Unknown time  . dolutegravir (TIVICAY) 50 MG tablet Take 1 tablet (50 mg total) by mouth daily. 30 tablet 1 03/25/2019 at Unknown time  . emtricitabine-tenofovir (TRUVADA) 200-300 MG tablet Take 1 tablet by mouth daily. 30 tablet 1 03/25/2019 at Unknown time  . Prenatal Vit-Fe Fumarate-FA (PRENATAL MULTIVITAMIN) TABS tablet Take 1 tablet by mouth daily at 12 noon.   03/25/2019 at Unknown time  . Blood Pressure Monitoring DEVI 1 Device by Does not apply route once a week. ICD 10: O09.90 1 Device 0      Allergies: has No Known Allergies. OBHx:  OB History  Gravida Para Term Preterm AB Living  2 0 0 0 1 0  SAB TAB Ectopic Multiple Live Births  1 0 0 0      # Outcome Date GA Lbr Len/2nd Weight Sex Delivery Anes PTL Lv  2 Current           1 SAB 01/02/13 [redacted]w[redacted]d                 FHx:  Family History  Adopted: Yes  Problem Relation  Age of Onset  . Hypertension Mother   . Autism Brother    Soc Hx:  Social History   Socioeconomic History  . Marital status: Single    Spouse name: Not on file  . Number of children: Not on file  . Years of education: Not on file  . Highest education level: Not on file  Occupational History    Comment: unemployed  Tobacco Use  . Smoking status: Never Smoker  . Smokeless tobacco: Never Used  Substance and Sexual Activity  . Alcohol use: Not Currently    Comment:  occasional - last use 08/2018  . Drug use: Yes    Types: Marijuana    Comment: last use 08/24/18  . Sexual activity: Yes    Partners: Male    Birth control/protection: None  Other Topics Concern  . Not on file  Social History Narrative  . Not on file   Social Determinants of Health   Financial Resource Strain:   . Difficulty of Paying Living Expenses: Not on file  Food Insecurity: No Food Insecurity  . Worried About Charity fundraiser in the Last Year: Never true  . Ran Out of Food in the Last Year: Never true  Transportation Needs: No Transportation Needs  . Lack of Transportation (Medical): No  . Lack of Transportation (Non-Medical): No  Physical Activity:   . Days of Exercise per Week: Not on file  . Minutes of Exercise per Session: Not on file  Stress:   . Feeling of Stress : Not on file  Social Connections:   . Frequency of Communication with Friends and Family: Not on file  . Frequency of Social Gatherings with Friends and Family: Not on file  . Attends Religious Services: Not on file  . Active Member of Clubs or Organizations: Not on file  . Attends Archivist Meetings: Not on file  . Marital Status: Not on file  Intimate Partner Violence: Not At Risk  . Fear of Current or Ex-Partner: No  . Emotionally Abused: No  . Physically Abused: No  . Sexually Abused: No    Objective    Current Vital Signs 24h Vital Sign Ranges  T   Temp  Avg: 97.7 F (36.5 C)  Min: 97.7 F (36.5 C)  Max: 97.7 F (36.5 C)  BP   BP  Min: 123/78  Max: 126/82  HR   Pulse  Avg: 75.5  Min: 71  Max: 80  RR   No data recorded  SaO2     No data recorded       24 Hour I/O Current Shift I/O  Time Ins Outs No intake/output data recorded. No intake/output data recorded.   EFM: 135 baseline, +accels, no decel, mod variability  Toco: rare UCs  General: Well nourished, well developed female in no acute distress.  Skin:  Warm and dry.  Cardiovascular: S1, S2 normal, no murmur, rub  or gallop, regular rate and rhythm Respiratory:  Clear to auscultation bilateral. Normal respiratory effort Abdomen: gravid nttp Neuro/Psych:  Normal mood and affect.    Labs  pending  Radiology 0000000: cephalic, efw 6%, [id number], AC 4%, bpp 8/8, afi 3.5, largest pocket 1.98  Assessment & Plan   29 y.o. G2P0010 @ [redacted]w[redacted]d with new oligo, FGR; pt stable *Pregnancy: reactive NST; qshift NSTs *Oligo: no e/o ROM on ROS. Plan is rehydration with IVF bolus and MIVF, NPO after MN and repeat bpp in am. If still low, likely move towards delivery.  If normal, then likely can do exp management until delivery at 37wks *FGR: see above *Preterm: BMZ course, consult NICU PRN *HIV: saw ID today. VL results likely not to be back for a few days. If needs delivery tomorrow, I d/w her that it will have to be via c-section. Also will need AZT pre load pre op. Continue home meds.   *GBS: swab ordered *Analgesia: no issues *Dispo: see above  Durene Romans MD Attending [facility] GYN Consult Phone: [phone] (M-F, [phone]) & [phone] (Off hours, weekends, holidays)

## 2019-03-26 NOTE — Consult Note (Signed)
MFM Note  This patient was seen for a follow up growth scan due to maternal HIV disease.  The patient is currently treated with Truvada and Tivicay for treatment of HIV.  Her most recent viral load in December 2020 was 1480 copies per milliliter.  The patient reports that she has been taking her HIV medications on a daily basis.  She denies any problems since her last exam.  On today's ultrasound, the overall EFW measures at the 6 percentile for her gestational age indicating fetal growth restriction.  Oligohydramnios with a total AFI of 3.56 cm was noted.  The patient denies any recent leakage of fluid.  Doppler studies of the umbilical arteries performed today due to fetal growth restriction showed a normal S/D ratio of 2.18.  There were no signs of absent or reversed end-diastolic flow.    A biophysical profile performed today was 8 out of 8.  Due to fetal growth restriction with oligohydramnios, the patient will be admitted to the hospital later today to receive a complete course of antenatal corticosteroids and IV hydration.  She was advised to go to the hospital after her infectious disease appointment this morning.  We will ask infectious disease if any additional labs are indicated in addition to her viral load.   A repeat ultrasound should be performed tomorrow after she has received IV hydration overnight.  Should oligohydramnios continue to be noted, delivery will be recommended.  Her delivery may be delayed until her HIV viral load results are available as long as the fetal heart rate tracing is reactive.  The patient understands that should her repeat HIV viral load be greater than 1000 copies per milliliter, that a cesarean delivery will be recommended along with IV AZT prior to delivery to decrease the risk of vertical transmission.  A total of 15 minutes was spent counseling and coordinating the care for this patient.  Greater than 50% of the time was spent in direct face-to-face  contact.

## 2019-03-26 NOTE — Assessment & Plan Note (Signed)
HIV 1 RNA Quant (copies/mL)  Date Value  02/18/2019 1,480 (H)  02/04/2019 4,750 (H)  11/26/2018 1,190 (H)   CD4 T Cell Abs (/uL)  Date Value  02/04/2019 68 (L)  10/03/2018 94 (L)  08/27/2018 90 (L)   She is apparently due to go to [facility] for possible intervention due to low fluid in discussion with her.  I will check in on her in the morning to see if she has been admitted to [facility] women's unit to further understand the plan.  We did draw a viral load today however it is unlikely to be back in time for any urgent decisions before early next week.  I recommend to proceed with cesarean section if we need to make a decision before her viral load comes back to deliver the baby due to other obstetrical concerns.  While she may very well have had improved adherence in the last 4 weeks her past viral loads makes me very concerned. Counseled on recommendation to not breast feed and bottle feed only.   She will continue Tivicay + Truvada for now and plan on resuming Biktarvy in March. Will keep her in mind for long acting injectable treatment in the future to help with her adherence. For now will schedule a follow up apt in April.

## 2019-03-26 NOTE — Progress Notes (Signed)
Name: Kimberly Bishop  DOB: 01-15-1991 MRN: [id number] PCP: Patient, No Pcp Per  OB/GYN: [name], MD   Patient Active Problem List   Diagnosis Date Noted  . Rubella nonimmune status, delivered, current hospitalization 02/15/2019  . Hematuria, gross 01/08/2019  . Diastasis recti 12/06/2018  . CIN I (cervical intraepithelial neoplasia I) 11/28/2018  . ASCUS with positive high risk HPV cervical 11/26/2018  . Supervision of high risk pregnancy, antepartum 10/02/2018  . Encounter for long-term (current) use of high-risk medication 03/28/2017  . Molluscum contagiosum 03/22/2016  . At risk for opportunistic infections 03/22/2016  . HIV affecting pregnancy, antepartum 07/03/2012     Brief Narrative:  Kimberly Bishop is a 29 y.o. female with HIV disease. CD4 nadir 90, likely less unrecorded VL: she has never been undetectable HIV Risk: Perinatal History of OIs: Intake Labs 2013: Hep B sAg (-), sAb (-), cAb (-); Hep A (-), Hep C (-) Quantiferon () HLA B*5701 (-) G6PD: ()   Previous Regimens: . Tivicay + Odefsey . Biktarvy . Tivicay + Truvada (pregnancy 10-2018)  Genotypes: . 05/2018: wildtype virus  . 02/18/19: no significant mutations including integrase     Subjective:  CC: HIV follow up care in pregnant female. Currently in third trimester;  EDD February 9th, 2021. She is having a daughter, named Kimberly Bishop.     HPI: Kimberly Bishop is here for her 36-week follow-up.  She tells me she is going for an induction today due to low birthweight low fluid.  She is headed to [facility] following today's appointment.  She reports good fetal movement.  No vaginal bleeding or cramping.  She is eating and drinking well.  She is taking her Tivicay and Truvada as regularly as she can; she admits she still has problems with adherence but has really tried to do better for her daughter.  Her last viral load was 1400 60-monthago.  She wants to do the best thing for her baby even if that  includes a C-section.  She is not planning on breast-feeding and will bottlefeed after the baby is born.  She is very interested in long-acting injectable option for HIV treatment.  She had a pap smear in July with OB/GYN team - read as ASCUS with +HPV.  Underwent colposcopy 11/27/2018 revealing CIN1.    Depression screen PHQ 2/9 02/18/2019  Decreased Interest 0  Down, Depressed, Hopeless 0  PHQ - 2 Score 0  Altered sleeping -  Tired, decreased energy -  Change in appetite -  Feeling bad or failure about yourself  -  Trouble concentrating -  Moving slowly or fidgety/restless -  Suicidal thoughts -  PHQ-9 Score -  Some recent data might be hidden    Review of Systems  All other systems reviewed and are negative.   Past Medical History:  Diagnosis Date  . AIDS (acquired immunodeficiency syndrome), CD4 <=200 ([place]   . History of kidney stones   . HIV [place]human immunodeficiency virus infection) ([place]   . Immune deficiency disorder ([facility]     Outpatient Medications Prior to Visit  Medication Sig Dispense Refill  . Prenatal MV-Min-FA-Omega-3 (PRENATAL GUMMIES/DHA & FA) 0.4-32.5 MG CHEW Chew 2 tablets by mouth daily.    . dolutegravir (TIVICAY) 50 MG tablet Take 1 tablet (50 mg total) by mouth daily. 30 tablet 11  . Blood Pressure Monitoring DEVI 1 Device by Does not apply route once a week. ICD 10: O09.90 1 Device 0  . emtricitabine-tenofovir (TRUVADA) 200-300 MG  tablet Take 1 tablet by mouth daily. 30 tablet 11   No facility-administered medications prior to visit.     No Known Allergies  Social History   Tobacco Use  . Smoking status: Never Smoker  . Smokeless tobacco: Never Used  Substance Use Topics  . Alcohol use: Not Currently    Comment: occasional - last use 08/2018  . Drug use: Yes    Types: Marijuana    Comment: last use 08/24/18    Social History   Substance and Sexual Activity  Sexual Activity Yes  . Partners: Male  . Birth control/protection: None      Objective:   Vitals:   03/26/19 1117  BP: 123/78  Pulse: 80  Weight: 155 lb (70.3 kg)   Body mass index is 28.35 kg/m.  Physical Exam Constitutional:      Comments: Seated comfortably in the chair in no distress.  HENT:     Mouth/Throat:     Mouth: Mucous membranes are moist.     Pharynx: Oropharynx is clear. No oropharyngeal exudate.  Cardiovascular:     Rate and Rhythm: Normal rate and regular rhythm.  Pulmonary:     Effort: Pulmonary effort is normal.     Breath sounds: Normal breath sounds.  Abdominal:     Comments: Pregnant  Skin:    Capillary Refill: Capillary refill takes less than 2 seconds.  Neurological:     Mental Status: She is oriented to person, place, and time.  Psychiatric:     Comments: In good spirits.     Lab Results Lab Results  Component Value Date   WBC 8.6 01/31/2019   HGB 9.9 (L) 01/31/2019   HCT 29.9 (L) 01/31/2019   MCV 88 01/31/2019   PLT 254 01/31/2019    Lab Results  Component Value Date   CREATININE 0.85 05/25/2018   BUN 11 05/25/2018   NA 140 05/25/2018   K 4.2 05/25/2018   CL 107 05/25/2018   CO2 26 05/25/2018    Lab Results  Component Value Date   ALT 26 05/25/2018   AST 21 05/25/2018   ALKPHOS 97 11/03/2016   BILITOT 0.3 05/25/2018    Lab Results  Component Value Date   CHOL 177 03/28/2017   HDL 44 (L) 03/28/2017   LDLCALC 119 (H) 03/28/2017   TRIG 58 03/28/2017   CHOLHDL 4.0 03/28/2017   HIV 1 RNA Quant (copies/mL)  Date Value  02/18/2019 1,480 (H)  02/04/2019 4,750 (H)  11/26/2018 1,190 (H)   CD4 T Cell Abs (/uL)  Date Value  02/04/2019 68 (L)  10/03/2018 94 (L)  08/27/2018 90 (L)     Assessment & Plan:   Problem List Items Addressed This Visit      Unprioritized   HIV affecting pregnancy, antepartum - Primary    HIV 1 RNA Quant (copies/mL)  Date Value  02/18/2019 1,480 (H)  02/04/2019 4,750 (H)  11/26/2018 1,190 (H)   CD4 T Cell Abs (/uL)  Date Value  02/04/2019 68 (L)   10/03/2018 94 (L)  08/27/2018 90 (L)   She is apparently due to go to [facility] for possible intervention due to low fluid in discussion with her.  I will check in on her in the morning to see if she has been admitted to [facility] women's unit to further understand the plan.  We did draw a viral load today however it is unlikely to be back in time for any urgent decisions before early next week.  I recommend to proceed with cesarean section if we need to make a decision before her viral load comes back to deliver the baby due to other obstetrical concerns.  While she may very well have had improved adherence in the last 4 weeks her past viral loads makes me very concerned. Counseled on recommendation to not breast feed and bottle feed only.   She will continue Tivicay + Truvada for now and plan on resuming Biktarvy in March. Will keep her in mind for long acting injectable treatment in the future to help with her adherence. For now will schedule a follow up apt in April.       Relevant Medications   emtricitabine-tenofovir (TRUVADA) 200-300 MG tablet   dolutegravir (TIVICAY) 50 MG tablet    Other Visit Diagnoses    HIV disease ([place])       Relevant Medications   emtricitabine-tenofovir (TRUVADA) 200-300 MG tablet   dolutegravir (TIVICAY) 50 MG tablet   Other Relevant Orders   HIV 1 RNA quant-no reflex-bld     I spent 15 minutes with the patient in face-to-face consultation and counseling of all the points detailed above.  Janene Madeira, MSN, NP-C [facility] for Infectious [place] Pager: [phone] Office: [phone]  03/26/19  12:07 PM

## 2019-03-27 ENCOUNTER — Encounter: Payer: Self-pay | Admitting: Infectious Diseases

## 2019-03-27 ENCOUNTER — Encounter (HOSPITAL_COMMUNITY): Payer: Self-pay | Admitting: Obstetrics and Gynecology

## 2019-03-27 ENCOUNTER — Inpatient Hospital Stay (HOSPITAL_BASED_OUTPATIENT_CLINIC_OR_DEPARTMENT_OTHER): Payer: Medicaid Other

## 2019-03-27 DIAGNOSIS — O365931 Maternal care for other known or suspected poor fetal growth, third trimester, fetus 1: Secondary | ICD-10-CM | POA: Diagnosis not present

## 2019-03-27 DIAGNOSIS — Z3A36 36 weeks gestation of pregnancy: Secondary | ICD-10-CM | POA: Diagnosis not present

## 2019-03-27 DIAGNOSIS — O98713 Human immunodeficiency virus [HIV] disease complicating pregnancy, third trimester: Secondary | ICD-10-CM

## 2019-03-27 LAB — GC/CHLAMYDIA PROBE AMP (~~LOC~~) NOT AT ARMC
Chlamydia: NEGATIVE
Comment: NEGATIVE
Comment: NORMAL
Neisseria Gonorrhea: NEGATIVE

## 2019-03-27 LAB — RPR: RPR Ser Ql: NONREACTIVE

## 2019-03-27 NOTE — Discharge Instructions (Signed)
Fetal Movement Counts Patient Name: ________________________________________________ Patient Due Date: ____________________ What is a fetal movement count?  A fetal movement count is the number of times that you feel your baby move during a certain amount of time. This may also be called a fetal kick count. A fetal movement count is recommended for every pregnant woman. You may be asked to start counting fetal movements as early as week 28 of your pregnancy. Pay attention to when your baby is most active. You may notice your baby's sleep and wake cycles. You may also notice things that make your baby move more. You should do a fetal movement count:  When your baby is normally most active.  At the same time each day. A good time to count movements is while you are resting, after having something to eat and drink. How do I count fetal movements? 1. Find a quiet, comfortable area. Sit, or lie down on your side. 2. Write down the date, the start time and stop time, and the number of movements that you felt between those two times. Take this information with you to your health care visits. 3. Write down your start time when you feel the first movement. 4. Count kicks, flutters, swishes, rolls, and jabs. You should feel at least 10 movements. 5. You may stop counting after you have felt 10 movements, or if you have been counting for 2 hours. Write down the stop time. 6. If you do not feel 10 movements in 2 hours, contact your health care provider for further instructions. Your health care provider may want to do additional tests to assess your baby's well-being. Contact a health care provider if:  You feel fewer than 10 movements in 2 hours.  Your baby is not moving like he or she usually does. Date: ____________ Start time: ____________ Stop time: ____________ Movements: ____________ Date: ____________ Start time: ____________ Stop time: ____________ Movements: ____________ Date: ____________  Start time: ____________ Stop time: ____________ Movements: ____________ Date: ____________ Start time: ____________ Stop time: ____________ Movements: ____________ Date: ____________ Start time: ____________ Stop time: ____________ Movements: ____________ Date: ____________ Start time: ____________ Stop time: ____________ Movements: ____________ Date: ____________ Start time: ____________ Stop time: ____________ Movements: ____________ Date: ____________ Start time: ____________ Stop time: ____________ Movements: ____________ Date: ____________ Start time: ____________ Stop time: ____________ Movements: ____________ This information is not intended to replace advice given to you by your health care provider. Make sure you discuss any questions you have with your health care provider. Document Revised: 10/18/2018 Document Reviewed: 10/18/2018 Elsevier Patient Education  2020 Elsevier Inc.  

## 2019-03-27 NOTE — Discharge Summary (Signed)
Discharge Summary   Admit Date: 03/26/2019 Discharge Date: 03/27/2019 Discharging Service: Antepartum  Primary OBGYN: [facility] Admitting Physician: Aletha Halim, MD  Discharge Physician: Ilda Basset  Referring Provider: Maternal fetal medicine  Primary Care Provider: Patient, No Pcp Per  Admission Diagnoses: Pregnancy at 36/0 weeks New FGR New Oligohydramnios AIDS Anemia  Discharge Diagnoses: Pregnancy at 36/1 New FGR Resolved Oligohydramnios Transient HTN AIDS Anemia  Consult Orders: None   Surgeries/Procedures Performed: BPPs  History and Physical:   Obstetrics Admission History & Physical    03/26/2019 - 1:18 PM  Primary OBGYN: [facility]     Chief Complaint: new found FGR and oligo on u/s today    History of Present Illness    29 y.o. G2P0010 @ [redacted]w[redacted]d, with the above CC. Pregnancy complicated by: AIDS, anemia, CIN 1.     [name] states that she's doing well w/o any complaints or issues.      Review of Systems:  as noted in the History of Present Illness.          Patient Active Problem List        Diagnosis   Date Noted    .   Oligohydramnios antepartum   03/26/2019    .   IUGR (intrauterine growth restriction) affecting care of mother   03/26/2019    .   Oligohydramnios   03/26/2019    .   Rubella nonimmune status, delivered, current hospitalization   02/15/2019    .   Hematuria, gross   01/08/2019    .   Diastasis recti   12/06/2018    .   CIN I (cervical intraepithelial neoplasia I)   11/28/2018    .   ASCUS with positive high risk HPV cervical   11/26/2018    .   Supervision of high risk pregnancy, antepartum   10/02/2018    .   Encounter for long-term (current) use of high-risk medication   03/28/2017    .   Molluscum contagiosum   03/22/2016    .   At risk for opportunistic infections    03/22/2016    .   HIV affecting pregnancy, antepartum   07/03/2012          PMHx:        Past Medical History:    Diagnosis   Date    .   AIDS (acquired immunodeficiency syndrome), CD4 <=200 ([place])        .   History of kidney stones        .   HIV (human immunodeficiency virus infection) ([place])        .   Immune deficiency disorder [facility])           PSHx:         Past Surgical History:    Procedure   Laterality   Date    .   APPENDECTOMY       2011    .   CYSTOSCOPY W/ URETERAL STENT PLACEMENT   Right   11/03/2016        Procedure: CYSTOSCOPY WITH RETROGRADE PYELOGRAM/URETERAL STENT PLACEMENT;  Surgeon: Kathie Rhodes, MD;  Location: [facility]S;  Service: Urology;  Laterality: Right;    .   CYSTOSCOPY/URETEROSCOPY/HOLMIUM LASER/STENT PLACEMENT   Right   01/16/2017        Procedure: CYSTOSCOPY/URETEROSCOPY/ STONE EXTRACTION/STENT removal;  Surgeon: Kathie Rhodes, MD;  Location: [facility];  Service: Urology;  Laterality: Right;    .  IR DIL URETER RIGHT       12/05/2016    .   IR NEPHROSTOMY PLACEMENT RIGHT       11/07/2016    .   IR URETERAL STENT PLACEMENT EXISTING ACCESS RIGHT       12/05/2016    .   NEPHROLITHOTOMY   Right   12/05/2016        Procedure: NEPHROLITHOTOMY PERCUTANEOUS;  Surgeon: Kathie Rhodes, MD;  Location: [facility];  Service: Urology;  Laterality: Right;       Medications:           Medications Prior to Admission    Medication   Sig   Dispense   Refill   Last Dose    .   calcium carbonate (TUMS - DOSED IN MG ELEMENTAL CALCIUM) 500 MG chewable tablet   Chew 2 tablets by mouth daily as needed for indigestion or heartburn.           Past Week at Unknown time    .   dolutegravir (TIVICAY) 50 MG tablet   Take 1 tablet (50 mg total) by mouth daily.   30 tablet   1   03/25/2019 at Unknown time    .   emtricitabine-tenofovir  (TRUVADA) 200-300 MG tablet   Take 1 tablet by mouth daily.   30 tablet   1   03/25/2019 at Unknown time    .   Prenatal Vit-Fe Fumarate-FA (PRENATAL MULTIVITAMIN) TABS tablet   Take 1 tablet by mouth daily at 12 noon.           03/25/2019 at Unknown time    .   Blood Pressure Monitoring DEVI   1 Device by Does not apply route once a week. ICD 10: O09.90   1 Device   0                 Allergies: has No Known Allergies.  OBHx:                    OB History    Gravida   Para   Term   Preterm   AB   Living    2   0   0   0   1   0    SAB   TAB   Ectopic   Multiple   Live Births    1   0   0   0             #   Outcome   Date   GA   Lbr Len/2nd   Weight   Sex   Delivery   Anes   PTL   Lv    2   Current                                        1   SAB   01/02/13   [redacted]w[redacted]d                                           FHx:         Family History    Adopted: Yes    Problem   Relation   Age of Onset    .   Hypertension  Mother        .   Autism   Brother           Soc Hx:    Social History             Socioeconomic History    .   Marital status:   Single            Spouse name:   Not on file    .   Number of children:   Not on file    .   Years of education:   Not on file    .   Highest education level:   Not on file    Occupational History            Comment: unemployed    Tobacco Use    .   Smoking status:   Never Smoker    .   Smokeless tobacco:   Never Used    Substance and Sexual Activity    .   Alcohol use:   Not Currently            Comment: occasional - last use 08/2018    .   Drug use:   Yes            Types:   Marijuana            Comment: last use 08/24/18    .   Sexual activity:   Yes             Partners:   Male            Birth control/protection:   None    Other Topics   Concern    .   Not on file    Social History Narrative    .   Not on file        Social Determinants of Health           Financial Resource Strain:     .   Difficulty of Paying Living Expenses: Not on file    Food Insecurity: No Food Insecurity    .   Worried About Charity fundraiser in the Last Year: Never true    .   Ran Out of Food in the Last Year: Never true    Transportation Needs: No Transportation Needs    .   Lack of Transportation (Medical): No    .   Lack of Transportation (Non-Medical): No    Physical Activity:     .   Days of Exercise per Week: Not on file    .   Minutes of Exercise per Session: Not on file    Stress:     .   Feeling of Stress : Not on file    Social Connections:     .   Frequency of Communication with Friends and Family: Not on file    .   Frequency of Social Gatherings with Friends and Family: Not on file    .   Attends Religious Services: Not on file    .   Active Member of Clubs or Organizations: Not on file    .   Attends Archivist Meetings: Not on file    .   Marital Status: Not on file    Intimate Partner Violence: Not At Risk    .   Fear of Current or Ex-Partner: No    .   Emotionally Abused: No    .  Physically Abused: No    .   Sexually Abused: No           Objective            Current Vital Signs   24h Vital Sign Ranges    T      Temp  Avg: 97.7 F (36.5 C)  Min: 97.7 F (36.5 C)  Max: 97.7 F (36.5 C)    BP      BP  Min: 123/78  Max: 126/82    HR      Pulse  Avg: 75.5  Min: 71  Max: 80    RR      No data recorded    SaO2         No data recorded                     24 Hour I/O   Current Shift I/O    Time  Ins  Outs   No intake/output data recorded.    No intake/output data recorded.       EFM: 135 baseline, +accels, no decel, mod variability   Toco: rare UCs     General: Well nourished, well developed female in no acute distress.   Skin:  Warm and dry.   Cardiovascular: S1, S2 normal, no murmur, rub or gallop, regular rate and rhythm  Respiratory:  Clear to auscultation bilateral. Normal respiratory effort  Abdomen: gravid nttp  Neuro/Psych:  Normal mood and affect.         Labs   pending     Radiology  0000000: cephalic, efw 6%, [id number], AC 4%, bpp 8/8, afi 3.5, largest pocket 1.98      Assessment & Plan     29 y.o. G2P0010 @ [redacted]w[redacted]d with new oligo, FGR; pt stable  *Pregnancy: reactive NST; qshift NSTs  *Oligo: no e/o ROM on ROS. Plan is rehydration with IVF bolus and MIVF, NPO after MN and repeat bpp in am. If still low, likely move towards delivery. If normal, then likely can do exp management until delivery at 37wks  *FGR: see above  *Preterm: BMZ course, consult NICU PRN  *HIV: saw ID today. VL results likely not to be back for a few days. If needs delivery tomorrow, I d/w her that it will have to be via c-section. Also will need AZT pre load pre op. Continue home meds.    *GBS: swab ordered  *Analgesia: no issues  *Dispo: see above     Durene Romans MD  Attending  [facility] [place] [facility])  GYN Consult Phone: [phone] (M-F, 0800-1700) & [phone] (Off hours, weekends, holidays)            Electronically signed by Aletha Halim, MD at 03/26/2019  2:56 PM  Electronically signed by Aletha Halim, MD at 03/26/2019  2:57 PM  Hospital Course: *Pregnancy: routine care. GBS swab and GC/CT swab both negative *New oligo: reactive NST on admission and throughout hospitalization. Patient IVF hydrated and resolved oligo on HD#2 with 6.53 AFI. Okay for d/c to home per MFM *AIDS: VL drawn at ID on day of admission. Will tentatively scheduled for primary  c/s in approximately one week, in order to give time for results to come back, given h/o of being above 1000. Pt aware of likely need for c/s  *Transient HTN: Barely mild range BP on rounds. per RN Gerlean Ren, repeat BP normal so patient fine for discharge to home. Follow up  in clinic. Pt w/o s/s of pre-eclampsia.    Discharge Exam:   Current Vital Signs 24h Vital Sign Ranges  T 98.1 F (36.7 C) Temp  Avg: 98.2 F (36.8 C)  Min: 98 F (36.7 C)  Max: 98.4 F (36.9 C)  BP 140/74 BP  Min: 112/68  Max: 140/74  HR 74 Pulse  Avg: 74  Min: 64  Max: 82  RR 18 Resp  Avg: 18  Min: 18  Max: 18  SaO2 100 % Room Air SpO2  Avg: 98 %  Min: 96 %  Max: 100 %       24 Hour I/O Current Shift I/O  Time Ins Outs 01/12 0701 - 01/13 0700 In: 2237.9 [P.O.:1220; I.V.:1017.9] Out: 1200 [Urine:1200] 01/13 0701 - 01/13 1900 In: 366.3 [I.V.:366.3] Out: 300 [Urine:300]    Patient Vitals for the past 24 hrs:  BP Temp Temp src Pulse Resp SpO2 Height Weight  03/27/19 0745 140/74 98.1 F (36.7 C) Oral 74 18 100 % -- --  03/27/19 0608 122/64 98.3 F (36.8 C) Oral 64 18 98 % -- --  03/26/19 2310 -- -- -- -- -- 96 % -- --  03/26/19 2309 112/68 98 F (36.7 C) Oral 69 18 96 % -- --  03/26/19 1932 131/67 98.4 F (36.9 C) Oral 75 18 100 % -- --  03/26/19 1655 123/68 98.4 F (36.9 C) Oral 82 18 98 % -- --  03/26/19 1300 -- -- -- -- -- -- 5\' 2"  (1.575 m) 70.3 kg   145 baseline +accels, no decel, mod variability Occasional UCs over 35 minutes  General appearance: Well nourished, well developed female in no acute distress.  Cardiovascular: S1, S2 normal, no murmur, rub or gallop, regular rate and rhythm Respiratory:  Clear to auscultation bilateral. Normal respiratory effort Abdomen: gravid, nttp Neuro/Psych:  Normal mood and affect.  Skin:  Warm and dry.   Discharge Disposition:  Home  Patient Instructions:  Standard   Results Pending at Discharge:  HIV viral load  Discharge Medications: Allergies  as of 03/27/2019   No Known Allergies     Medication List    TAKE these medications   Blood Pressure Monitoring Devi 1 Device by Does not apply route once a week. ICD 10: O09.90   calcium carbonate 500 MG chewable tablet Commonly known as: TUMS - dosed in mg elemental calcium Chew 2 tablets by mouth daily as needed for indigestion or heartburn.   emtricitabine-tenofovir 200-300 MG tablet Commonly known as: TRUVADA Take 1 tablet by mouth daily.   prenatal multivitamin Tabs tablet Take 1 tablet by mouth daily at 12 noon.   Tivicay 50 MG tablet Generic drug: dolutegravir Take 1 tablet (50 mg total) by mouth daily.        Future Appointments  Date Time Provider [place]  04/01/2019  9:35 AM Truett Mainland, DO [facility] [place]  06/27/2019  1:45 PM Doren Custard, Melton Krebs, NP [facility]    Durene Romans MD Attending [facility] [place] [facility])

## 2019-03-27 NOTE — Progress Notes (Signed)
D/c home teaching complete   Monitoring done   bmz given

## 2019-03-28 ENCOUNTER — Telehealth: Payer: Self-pay | Admitting: Advanced Practice Midwife

## 2019-03-28 ENCOUNTER — Encounter: Payer: Self-pay | Admitting: *Deleted

## 2019-03-28 ENCOUNTER — Inpatient Hospital Stay (EMERGENCY_DEPARTMENT_HOSPITAL)
Admission: AD | Admit: 2019-03-28 | Discharge: 2019-03-28 | Disposition: A | Discharge status: Home / Self Care | Payer: Medicaid Other | Source: Home / Self Care | Attending: Obstetrics & Gynecology | Admitting: Obstetrics & Gynecology

## 2019-03-28 ENCOUNTER — Encounter (HOSPITAL_COMMUNITY): Payer: Self-pay | Admitting: Obstetrics & Gynecology

## 2019-03-28 ENCOUNTER — Telehealth: Payer: Self-pay

## 2019-03-28 DIAGNOSIS — O2343 Unspecified infection of urinary tract in pregnancy, third trimester: Secondary | ICD-10-CM | POA: Insufficient documentation

## 2019-03-28 DIAGNOSIS — M6208 Separation of muscle (nontraumatic), other site: Secondary | ICD-10-CM

## 2019-03-28 DIAGNOSIS — O1493 Unspecified pre-eclampsia, third trimester: Secondary | ICD-10-CM

## 2019-03-28 DIAGNOSIS — Z283 Underimmunization status: Secondary | ICD-10-CM

## 2019-03-28 DIAGNOSIS — B069 Rubella without complication: Secondary | ICD-10-CM | POA: Insufficient documentation

## 2019-03-28 DIAGNOSIS — O0993 Supervision of high risk pregnancy, unspecified, third trimester: Secondary | ICD-10-CM | POA: Insufficient documentation

## 2019-03-28 DIAGNOSIS — Z8249 Family history of ischemic heart disease and other diseases of the circulatory system: Secondary | ICD-10-CM | POA: Insufficient documentation

## 2019-03-28 DIAGNOSIS — R310 Gross hematuria: Secondary | ICD-10-CM | POA: Insufficient documentation

## 2019-03-28 DIAGNOSIS — O98713 Human immunodeficiency virus [HIV] disease complicating pregnancy, third trimester: Secondary | ICD-10-CM | POA: Insufficient documentation

## 2019-03-28 DIAGNOSIS — O113 Pre-existing hypertension with pre-eclampsia, third trimester: Secondary | ICD-10-CM | POA: Insufficient documentation

## 2019-03-28 DIAGNOSIS — G44219 Episodic tension-type headache, not intractable: Secondary | ICD-10-CM | POA: Insufficient documentation

## 2019-03-28 DIAGNOSIS — Z3A36 36 weeks gestation of pregnancy: Secondary | ICD-10-CM | POA: Insufficient documentation

## 2019-03-28 DIAGNOSIS — O99891 Other specified diseases and conditions complicating pregnancy: Secondary | ICD-10-CM | POA: Insufficient documentation

## 2019-03-28 DIAGNOSIS — Z21 Asymptomatic human immunodeficiency virus [HIV] infection status: Secondary | ICD-10-CM | POA: Insufficient documentation

## 2019-03-28 DIAGNOSIS — O3443 Maternal care for other abnormalities of cervix, third trimester: Secondary | ICD-10-CM | POA: Insufficient documentation

## 2019-03-28 DIAGNOSIS — N870 Mild cervical dysplasia: Secondary | ICD-10-CM

## 2019-03-28 DIAGNOSIS — O99353 Diseases of the nervous system complicating pregnancy, third trimester: Secondary | ICD-10-CM | POA: Insufficient documentation

## 2019-03-28 DIAGNOSIS — O0990 Supervision of high risk pregnancy, unspecified, unspecified trimester: Secondary | ICD-10-CM

## 2019-03-28 DIAGNOSIS — O99892 Other specified diseases and conditions complicating childbirth: Secondary | ICD-10-CM

## 2019-03-28 DIAGNOSIS — O98719 Human immunodeficiency virus [HIV] disease complicating pregnancy, unspecified trimester: Secondary | ICD-10-CM

## 2019-03-28 LAB — URINALYSIS, ROUTINE W REFLEX MICROSCOPIC
Bilirubin Urine: NEGATIVE
Glucose, UA: NEGATIVE mg/dL
Hgb urine dipstick: NEGATIVE
Ketones, ur: NEGATIVE mg/dL
Nitrite: NEGATIVE
Protein, ur: 30 mg/dL — AB
Specific Gravity, Urine: 1.009 (ref 1.005–1.030)
WBC, UA: >50 WBC/hpf — ABNORMAL HIGH (ref 0–5)
pH: 7 (ref 5.0–8.0)

## 2019-03-28 LAB — CBC
HCT: 30 % — ABNORMAL LOW (ref 36.0–46.0)
Hemoglobin: 9.6 g/dL — ABNORMAL LOW (ref 12.0–15.0)
MCH: 25.8 pg — ABNORMAL LOW (ref 26.0–34.0)
MCHC: 32 g/dL (ref 30.0–36.0)
MCV: 80.6 fL (ref 80.0–100.0)
Platelets: 375 10*3/uL (ref 150–400)
RBC: 3.72 MIL/uL — ABNORMAL LOW (ref 3.87–5.11)
RDW: 14.3 % (ref 11.5–15.5)
WBC: 9.5 10*3/uL (ref 4.0–10.5)
nRBC: 0.7 % — ABNORMAL HIGH (ref 0.0–0.2)

## 2019-03-28 LAB — COMPREHENSIVE METABOLIC PANEL
ALT: 14 U/L (ref 0–44)
AST: 22 U/L (ref 15–41)
Albumin: 2.9 g/dL — ABNORMAL LOW (ref 3.5–5.0)
Alkaline Phosphatase: 190 U/L — ABNORMAL HIGH (ref 38–126)
Anion gap: 10 (ref 5–15)
BUN: 10 mg/dL (ref 6–20)
CO2: 19 mmol/L — ABNORMAL LOW (ref 22–32)
Calcium: 9.2 mg/dL (ref 8.9–10.3)
Chloride: 105 mmol/L (ref 98–111)
Creatinine, Ser: 0.8 mg/dL (ref 0.44–1.00)
GFR calc Af Amer: >60 mL/min (ref >60)
GFR calc non Af Amer: >60 mL/min (ref >60)
Glucose, Bld: 106 mg/dL — ABNORMAL HIGH (ref 70–99)
Potassium: 4.1 mmol/L (ref 3.5–5.1)
Sodium: 134 mmol/L — ABNORMAL LOW (ref 135–145)
Total Bilirubin: 0.6 mg/dL (ref 0.3–1.2)
Total Protein: 7.7 g/dL (ref 6.5–8.1)

## 2019-03-28 LAB — CULTURE, BETA STREP (GROUP B ONLY)

## 2019-03-28 LAB — PROTEIN / CREATININE RATIO, URINE
Creatinine, Urine: 42.93 mg/dL
Protein Creatinine Ratio: 0.68 mg/mg{Cre} — ABNORMAL HIGH (ref 0.00–0.15)
Total Protein, Urine: 29 mg/dL

## 2019-03-28 MED ORDER — CYCLOBENZAPRINE HCL 10 MG PO TABS
10.0000 mg | ORAL_TABLET | Freq: Once | ORAL | Status: AC
  Administered 2019-03-28: 05:00:00 10 mg via ORAL
  Filled 2019-03-28: qty 1

## 2019-03-28 MED ORDER — ACETAMINOPHEN 500 MG PO TABS
1000.0000 mg | ORAL_TABLET | Freq: Once | ORAL | Status: AC
  Administered 2019-03-28: 1000 mg via ORAL
  Filled 2019-03-28: qty 2

## 2019-03-28 MED ORDER — CYCLOBENZAPRINE HCL 10 MG PO TABS: 5.0000 mg | ORAL_TABLET | Freq: Three times a day (TID) | ORAL | 0 refills | Status: DC | PRN

## 2019-03-28 MED ORDER — CEPHALEXIN 500 MG PO CAPS: 500.0000 mg | ORAL_CAPSULE | Freq: Four times a day (QID) | ORAL | 0 refills | Status: DC

## 2019-03-28 NOTE — MAU Provider Note (Signed)
Chief Complaint:  Hypertension   None     HPI: Kimberly Bishop is a 29 y.o. G2P0010 at [redacted]w[redacted]d by LMP pt of [place] with medical hx significant for HIV positive, IUGR, and oligohydramnios who presents to maternity admissions reporting intermittent h/a, feeling hot and flushed, and HTN on home cuff today. She took her blood pressure at home because she wasn't feeling well. She reports h/a off and on for a few days, resolved with Tylenol. She had a h/a at home today that resolved, then the h/a has started again in MAU.  She also reports painful irregular contractions.  There are no other symptoms. She has not tried any treatments.  She reports good fetal movement.  Pt had high viral load of 1480 on 02/18/19 and lab was drawn again on 03/26/19 and is pending.  Cesarean section is recommended to reduce vertical transmission if viral load is >1000.    HPI  Past Medical History: Past Medical History:  Diagnosis Date  . AIDS (acquired immunodeficiency syndrome), CD4 <=200 ([place])   . History of kidney stones   . HIV (human immunodeficiency virus infection) ([place])   . Immune deficiency disorder ([place])     Past obstetric history: OB History  Gravida Para Term Preterm AB Living  2 0 0 0 1 0  SAB TAB Ectopic Multiple Live Births  1 0 0 0      # Outcome Date GA Lbr Len/2nd Weight Sex Delivery Anes PTL Lv  2 Current           1 SAB 01/02/13 [redacted]w[redacted]d           Past Surgical History: Past Surgical History:  Procedure Laterality Date  . APPENDECTOMY  2011  . CYSTOSCOPY W/ URETERAL STENT PLACEMENT Right 11/03/2016   Procedure: CYSTOSCOPY WITH RETROGRADE PYELOGRAM/URETERAL STENT PLACEMENT;  Surgeon: Kathie Rhodes, MD;  Location: [facility];  Service: Urology;  Laterality: Right;  . CYSTOSCOPY/URETEROSCOPY/HOLMIUM LASER/STENT PLACEMENT Right 01/16/2017   Procedure: CYSTOSCOPY/URETEROSCOPY/ STONE EXTRACTION/STENT removal;  Surgeon: Kathie Rhodes, MD;  Location: [facility];  Service: Urology;  Laterality: Right;   . IR DIL URETER RIGHT  12/05/2016  . IR NEPHROSTOMY PLACEMENT RIGHT  11/07/2016  . IR URETERAL STENT PLACEMENT EXISTING ACCESS RIGHT  12/05/2016  . NEPHROLITHOTOMY Right 12/05/2016   Procedure: NEPHROLITHOTOMY PERCUTANEOUS;  Surgeon: Kathie Rhodes, MD;  Location: [facility];  Service: Urology;  Laterality: Right;    Family History: Family History  Adopted: Yes  Problem Relation Age of Onset  . Hypertension Mother   . Autism Brother     Social History: Social History   Tobacco Use  . Smoking status: Never Smoker  . Smokeless tobacco: Never Used  Substance Use Topics  . Alcohol use: Not Currently    Comment: occasional - last use 08/2018  . Drug use: Yes    Types: Marijuana    Comment: last use 08/24/18    Allergies: No Known Allergies  Meds:  Medications Prior to Admission  Medication Sig Dispense Refill Last Dose  . dolutegravir (TIVICAY) 50 MG tablet Take 1 tablet (50 mg total) by mouth daily. 30 tablet 1 03/27/2019 at Unknown time  . emtricitabine-tenofovir (TRUVADA) 200-300 MG tablet Take 1 tablet by mouth daily. 30 tablet 1 03/27/2019 at Unknown time  . Prenatal Vit-Fe Fumarate-FA (PRENATAL MULTIVITAMIN) TABS tablet Take 1 tablet by mouth daily at 12 noon.   03/27/2019 at Unknown time  . Blood Pressure Monitoring DEVI 1 Device by Does not apply route once a  week. ICD 10: O09.90 1 Device 0   . calcium carbonate (TUMS - DOSED IN MG ELEMENTAL CALCIUM) 500 MG chewable tablet Chew 2 tablets by mouth daily as needed for indigestion or heartburn.       ROS:  Review of Systems  Constitutional: Negative for chills, fatigue and fever.  Eyes: Negative for visual disturbance.  Respiratory: Negative for shortness of breath.   Cardiovascular: Negative for chest pain.  Gastrointestinal: Positive for abdominal pain. Negative for nausea and vomiting.  Genitourinary: Negative for difficulty urinating, dysuria, flank pain, pelvic pain, vaginal bleeding, vaginal discharge and vaginal pain.   Neurological: Positive for headaches. Negative for dizziness.  Psychiatric/Behavioral: Negative.      I have reviewed patient's Past Medical Hx, Surgical Hx, Family Hx, Social Hx, medications and allergies.   Physical Exam   Patient Vitals for the past 24 hrs:  BP Temp Pulse Resp Height Weight  03/28/19 0530 139/81 -- 70 -- -- --  03/28/19 0500 (!) 146/80 -- 62 -- -- --  03/28/19 0445 (!) 144/67 -- 60 -- -- --  03/28/19 0430 (!) 147/84 -- 72 -- -- --  03/28/19 0417 128/78 -- 75 -- -- --  03/28/19 0401 (!) 164/93 -- 85 -- -- --  03/28/19 0355 (!) 153/86 -- 71 -- -- --  03/28/19 0332 132/73 -- 63 -- -- --  03/28/19 0316 134/82 -- 65 -- -- --  03/28/19 0301 133/83 -- 65 -- -- --  03/28/19 0246 124/78 -- 67 -- -- --  03/28/19 0231 138/75 -- 70 -- -- --  03/28/19 0215 132/78 -- 73 -- -- --  03/28/19 0155 (!) 142/81 (!) 97.4 F (36.3 C) 73 16 5\' 2"  (1.575 m) 71.2 kg   Constitutional: Well-developed, well-nourished female in no acute distress.  HEART: normal rate, heart sounds, regular rhythm RESP: normal effort, lung sounds clear and equal bilaterally GI: Abd soft, non-tender, gravid appropriate for gestational age.  MS: Extremities nontender, no edema, normal ROM Neurologic: Alert and oriented x 4.  GU: Neg CVAT.   Dilation: Fingertip(closed internally) Exam by:: Esau Grew, RN  FHT:  Baseline 135 , moderate variability, accelerations present, no decelerations Contractions: q 5-12 mins, mild to moderate to palpation   Labs: Results for orders placed or performed during the hospital encounter of 03/28/19 (from the past 24 hour(s))  Urinalysis, Routine w reflex microscopic     Status: Abnormal   Collection Time: 03/28/19  2:15 AM  Result Value Ref Range   Color, Urine STRAW (A) YELLOW   APPearance HAZY (A) CLEAR   Specific Gravity, Urine 1.009 1.005 - 1.030   pH 7.0 5.0 - 8.0   Glucose, UA NEGATIVE NEGATIVE mg/dL   Hgb urine dipstick NEGATIVE NEGATIVE   Bilirubin Urine  NEGATIVE NEGATIVE   Ketones, ur NEGATIVE NEGATIVE mg/dL   Protein, ur 30 (A) NEGATIVE mg/dL   Nitrite NEGATIVE NEGATIVE   Leukocytes,Ua LARGE (A) NEGATIVE   RBC / HPF 0-5 0 - 5 RBC/hpf   WBC, UA >50 (H) 0 - 5 WBC/hpf   Bacteria, UA RARE (A) NONE SEEN   Squamous Epithelial / LPF 0-5 0 - 5   Mucus PRESENT   Protein / creatinine ratio, urine     Status: Abnormal   Collection Time: 03/28/19  2:56 AM  Result Value Ref Range   Creatinine, Urine 42.93 mg/dL   Total Protein, Urine 29 mg/dL   Protein Creatinine Ratio 0.68 (H) 0.00 - 0.15 mg/mg[Cre]  CBC  Status: Abnormal   Collection Time: 03/28/19  3:29 AM  Result Value Ref Range   WBC 9.5 4.0 - 10.5 K/uL   RBC 3.72 (L) 3.87 - 5.11 MIL/uL   Hemoglobin 9.6 (L) 12.0 - 15.0 g/dL   HCT 30.0 (L) 36.0 - 46.0 %   MCV 80.6 80.0 - 100.0 fL   MCH 25.8 (L) 26.0 - 34.0 pg   MCHC 32.0 30.0 - 36.0 g/dL   RDW 14.3 11.5 - 15.5 %   Platelets 375 150 - 400 K/uL   nRBC 0.7 (H) 0.0 - 0.2 %  Comprehensive metabolic panel     Status: Abnormal   Collection Time: 03/28/19  3:29 AM  Result Value Ref Range   Sodium 134 (L) 135 - 145 mmol/L   Potassium 4.1 3.5 - 5.1 mmol/L   Chloride 105 98 - 111 mmol/L   CO2 19 (L) 22 - 32 mmol/L   Glucose, Bld 106 (H) 70 - 99 mg/dL   BUN 10 6 - 20 mg/dL   Creatinine, Ser 0.80 0.44 - 1.00 mg/dL   Calcium 9.2 8.9 - 10.3 mg/dL   Total Protein 7.7 6.5 - 8.1 g/dL   Albumin 2.9 (L) 3.5 - 5.0 g/dL   AST 22 15 - 41 U/L   ALT 14 0 - 44 U/L   Alkaline Phosphatase 190 (H) 38 - 126 U/L   Total Bilirubin 0.6 0.3 - 1.2 mg/dL   GFR calc non Af Amer >60 >60 mL/min   GFR calc Af Amer >60 >60 mL/min   Anion gap 10 5 - 15   --/--/O POS, O POS Performed at [facility], [address], [place], [place] [address]  (01/12 1257)  Imaging:    MAU Course/MDM: Orders Placed This Encounter  Procedures  . Urinalysis, Routine w reflex microscopic  . CBC  . Comprehensive metabolic panel  . Protein / creatinine ratio, urine     Meds ordered this encounter  Medications  . acetaminophen (TYLENOL) tablet 1,000 mg  . cyclobenzaprine (FLEXERIL) tablet 10 mg  . cyclobenzaprine (FLEXERIL) 10 MG tablet    Sig: Take 0.5-1 tablets (5-10 mg total) by mouth 3 (three) times daily as needed for muscle spasms.    Dispense:  10 tablet    Refill:  0    Order Specific Question:   Supervising Provider    Answer:   Elonda Husky, LUTHER H [[id number]]  . cephALEXin (KEFLEX) 500 MG capsule    Sig: Take 1 capsule (500 mg total) by mouth 4 (four) times daily.    Dispense:  28 capsule    Refill:  0    Order Specific Question:   Supervising Provider    Answer:   Florian Buff [[id number]]     NST reviewed and reactive Contractions became more frequent in MAU but cervix FT, no evidence of active labor PEC labs ordered and P/C ratio 0.68, no other abnormalities Pt h/a not resolved with Tylenol Consult Dr Elonda Husky with assessment and findings Flexeril given for h/a and headache significantly improved UA with evidence of UTI  D/C home Rx for Flexeril and Keflex BP check in office on Friday    Pt discharge with strict PEC precautions.    Assessment: 1. UTI (urinary tract infection) during pregnancy, third trimester   2. Rubella nonimmune status, delivered, current hospitalization   3. Hematuria, gross   4. Diastasis recti   5. CIN I (cervical intraepithelial neoplasia I)   6. Supervision of high risk pregnancy,  antepartum   7. Preeclampsia, third trimester   8. HIV affecting pregnancy, antepartum   9. Episodic tension-type headache, not intractable     Plan: Discharge home Labor precautions and fetal kick counts Follow-up [place] for [facility] Follow up.   Specialty: Obstetrics and Gynecology Why: The office will call you to schedule a blood pressure check on Friday, 03/29/19. Return to MAU with worsening headache, signs of labor, or other emergencies. Contact information: [address], [place] [id number] [place] [phone]         Allergies as of 03/28/2019   No Known Allergies     Medication List    TAKE these medications   Blood Pressure Monitoring Devi 1 Device by Does not apply route once a week. ICD 10: O09.90   calcium carbonate 500 MG chewable tablet Commonly known as: TUMS - dosed in mg elemental calcium Chew 2 tablets by mouth daily as needed for indigestion or heartburn.   cephALEXin 500 MG capsule Commonly known as: KEFLEX Take 1 capsule (500 mg total) by mouth 4 (four) times daily.   cyclobenzaprine 10 MG tablet Commonly known as: FLEXERIL Take 0.5-1 tablets (5-10 mg total) by mouth 3 (three) times daily as needed for muscle spasms.   emtricitabine-tenofovir 200-300 MG tablet Commonly known as: TRUVADA Take 1 tablet by mouth daily.   prenatal multivitamin Tabs tablet Take 1 tablet by mouth daily at 12 noon.   Tivicay 50 MG tablet Generic drug: dolutegravir Take 1 tablet (50 mg total) by mouth daily.       Fatima Blank Certified Nurse-Midwife 03/28/2019 5:44 AM

## 2019-03-28 NOTE — Telephone Encounter (Signed)
Babys[employer]s called with elevated BP result of 158/87.  Called pt at [phone], call could not be completed. Called pt at [phone], VM box not set up. Called pt's mother, who stated the pt's phone is currently off. Pt's mother offered to contact pt. I explained that I will send a MyChart message and requested that the pt's mother ask her to check MyChart messages.

## 2019-03-28 NOTE — Telephone Encounter (Signed)
Attempted to call patient about coming in on 01/15 for a BP check.

## 2019-03-29 ENCOUNTER — Encounter (HOSPITAL_COMMUNITY)
Admission: AD | Disposition: A | Discharge status: Home / Self Care | Payer: Self-pay | Source: Home / Self Care | Attending: Obstetrics and Gynecology

## 2019-03-29 ENCOUNTER — Telehealth: Payer: Self-pay | Admitting: Obstetrics and Gynecology

## 2019-03-29 ENCOUNTER — Inpatient Hospital Stay (HOSPITAL_COMMUNITY)
Admission: AD | Admit: 2019-03-29 | Discharge: 2019-04-01 | DRG: 787 | Disposition: A | Discharge status: Home / Self Care | Payer: Medicaid Other | Attending: Obstetrics and Gynecology | Admitting: Obstetrics and Gynecology

## 2019-03-29 ENCOUNTER — Telehealth (INDEPENDENT_AMBULATORY_CARE_PROVIDER_SITE_OTHER): Payer: Medicaid Other | Admitting: *Deleted

## 2019-03-29 ENCOUNTER — Inpatient Hospital Stay (HOSPITAL_COMMUNITY): Payer: Medicaid Other | Admitting: Anesthesiology

## 2019-03-29 ENCOUNTER — Other Ambulatory Visit: Payer: Self-pay

## 2019-03-29 ENCOUNTER — Encounter (HOSPITAL_COMMUNITY): Payer: Self-pay | Admitting: Obstetrics and Gynecology

## 2019-03-29 DIAGNOSIS — O4103X0 Oligohydramnios, third trimester, not applicable or unspecified: Secondary | ICD-10-CM | POA: Diagnosis present

## 2019-03-29 DIAGNOSIS — O09299 Supervision of pregnancy with other poor reproductive or obstetric history, unspecified trimester: Secondary | ICD-10-CM | POA: Diagnosis present

## 2019-03-29 DIAGNOSIS — O09291 Supervision of pregnancy with other poor reproductive or obstetric history, first trimester: Secondary | ICD-10-CM | POA: Diagnosis present

## 2019-03-29 DIAGNOSIS — N870 Mild cervical dysplasia: Secondary | ICD-10-CM

## 2019-03-29 DIAGNOSIS — Z8249 Family history of ischemic heart disease and other diseases of the circulatory system: Secondary | ICD-10-CM | POA: Diagnosis not present

## 2019-03-29 DIAGNOSIS — O9872 Human immunodeficiency virus [HIV] disease complicating childbirth: Secondary | ICD-10-CM | POA: Diagnosis present

## 2019-03-29 DIAGNOSIS — O0990 Supervision of high risk pregnancy, unspecified, unspecified trimester: Secondary | ICD-10-CM

## 2019-03-29 DIAGNOSIS — Z21 Asymptomatic human immunodeficiency virus [HIV] infection status: Secondary | ICD-10-CM | POA: Diagnosis present

## 2019-03-29 DIAGNOSIS — R310 Gross hematuria: Secondary | ICD-10-CM

## 2019-03-29 DIAGNOSIS — Z3A36 36 weeks gestation of pregnancy: Secondary | ICD-10-CM

## 2019-03-29 DIAGNOSIS — Z87410 Personal history of cervical dysplasia: Secondary | ICD-10-CM

## 2019-03-29 DIAGNOSIS — O365930 Maternal care for other known or suspected poor fetal growth, third trimester, not applicable or unspecified: Principal | ICD-10-CM | POA: Diagnosis present

## 2019-03-29 DIAGNOSIS — M6208 Separation of muscle (nontraumatic), other site: Secondary | ICD-10-CM | POA: Diagnosis present

## 2019-03-29 DIAGNOSIS — B20 Human immunodeficiency virus [HIV] disease: Secondary | ICD-10-CM | POA: Diagnosis present

## 2019-03-29 DIAGNOSIS — B069 Rubella without complication: Secondary | ICD-10-CM | POA: Diagnosis present

## 2019-03-29 DIAGNOSIS — Z9189 Other specified personal risk factors, not elsewhere classified: Secondary | ICD-10-CM

## 2019-03-29 DIAGNOSIS — O99353 Diseases of the nervous system complicating pregnancy, third trimester: Secondary | ICD-10-CM | POA: Diagnosis present

## 2019-03-29 DIAGNOSIS — O2343 Unspecified infection of urinary tract in pregnancy, third trimester: Secondary | ICD-10-CM | POA: Diagnosis present

## 2019-03-29 DIAGNOSIS — O99891 Other specified diseases and conditions complicating pregnancy: Secondary | ICD-10-CM | POA: Diagnosis present

## 2019-03-29 DIAGNOSIS — O0993 Supervision of high risk pregnancy, unspecified, third trimester: Secondary | ICD-10-CM

## 2019-03-29 DIAGNOSIS — Z8759 Personal history of other complications of pregnancy, childbirth and the puerperium: Secondary | ICD-10-CM | POA: Diagnosis present

## 2019-03-29 DIAGNOSIS — O4100X0 Oligohydramnios, unspecified trimester, not applicable or unspecified: Secondary | ICD-10-CM | POA: Diagnosis present

## 2019-03-29 DIAGNOSIS — O1415 Severe pre-eclampsia, complicating the puerperium: Secondary | ICD-10-CM | POA: Diagnosis not present

## 2019-03-29 DIAGNOSIS — O98719 Human immunodeficiency virus [HIV] disease complicating pregnancy, unspecified trimester: Secondary | ICD-10-CM | POA: Diagnosis present

## 2019-03-29 DIAGNOSIS — Z283 Underimmunization status: Secondary | ICD-10-CM

## 2019-03-29 DIAGNOSIS — O1403 Mild to moderate pre-eclampsia, third trimester: Secondary | ICD-10-CM

## 2019-03-29 DIAGNOSIS — O1404 Mild to moderate pre-eclampsia, complicating childbirth: Secondary | ICD-10-CM | POA: Diagnosis present

## 2019-03-29 DIAGNOSIS — R87610 Atypical squamous cells of undetermined significance on cytologic smear of cervix (ASC-US): Secondary | ICD-10-CM | POA: Diagnosis present

## 2019-03-29 DIAGNOSIS — Z2839 Other underimmunization status: Secondary | ICD-10-CM

## 2019-03-29 DIAGNOSIS — G44219 Episodic tension-type headache, not intractable: Secondary | ICD-10-CM | POA: Diagnosis present

## 2019-03-29 DIAGNOSIS — O1493 Unspecified pre-eclampsia, third trimester: Secondary | ICD-10-CM

## 2019-03-29 DIAGNOSIS — O365990 Maternal care for other known or suspected poor fetal growth, unspecified trimester, not applicable or unspecified: Secondary | ICD-10-CM

## 2019-03-29 HISTORY — DX: Unspecified infectious disease: B99.9

## 2019-03-29 HISTORY — DX: Calculus of kidney: N20.0

## 2019-03-29 LAB — TYPE AND SCREEN
ABO/RH(D): O POS
Antibody Screen: NEGATIVE

## 2019-03-29 LAB — COMPREHENSIVE METABOLIC PANEL
ALT: 20 U/L (ref 0–44)
AST: 40 U/L (ref 15–41)
Albumin: 3 g/dL — ABNORMAL LOW (ref 3.5–5.0)
Alkaline Phosphatase: 183 U/L — ABNORMAL HIGH (ref 38–126)
Anion gap: 9 (ref 5–15)
BUN: 12 mg/dL (ref 6–20)
CO2: 19 mmol/L — ABNORMAL LOW (ref 22–32)
Calcium: 8.4 mg/dL — ABNORMAL LOW (ref 8.9–10.3)
Chloride: 106 mmol/L (ref 98–111)
Creatinine, Ser: 0.92 mg/dL (ref 0.44–1.00)
GFR calc Af Amer: >60 mL/min (ref >60)
GFR calc non Af Amer: >60 mL/min (ref >60)
Glucose, Bld: 77 mg/dL (ref 70–99)
Potassium: 4.6 mmol/L (ref 3.5–5.1)
Sodium: 134 mmol/L — ABNORMAL LOW (ref 135–145)
Total Bilirubin: 0.5 mg/dL (ref 0.3–1.2)
Total Protein: 7 g/dL (ref 6.5–8.1)

## 2019-03-29 LAB — CBC
HCT: 31.2 % — ABNORMAL LOW (ref 36.0–46.0)
Hemoglobin: 9.7 g/dL — ABNORMAL LOW (ref 12.0–15.0)
MCH: 25.9 pg — ABNORMAL LOW (ref 26.0–34.0)
MCHC: 31.1 g/dL (ref 30.0–36.0)
MCV: 83.2 fL (ref 80.0–100.0)
Platelets: 385 10*3/uL (ref 150–400)
RBC: 3.75 MIL/uL — ABNORMAL LOW (ref 3.87–5.11)
RDW: 14.6 % (ref 11.5–15.5)
WBC: 11 10*3/uL — ABNORMAL HIGH (ref 4.0–10.5)
nRBC: 0.7 % — ABNORMAL HIGH (ref 0.0–0.2)

## 2019-03-29 SURGERY — Surgical Case
Anesthesia: Spinal

## 2019-03-29 MED ORDER — LACTATED RINGERS IV SOLN: INTRAVENOUS | Status: DC | PRN

## 2019-03-29 MED ORDER — PHENYLEPHRINE HCL-NACL 20-0.9 MG/250ML-% IV SOLN
INTRAVENOUS | Status: AC
  Filled 2019-03-29: qty 250

## 2019-03-29 MED ORDER — ENALAPRIL MALEATE 5 MG PO TABS
5.0000 mg | ORAL_TABLET | Freq: Every day | ORAL | Status: DC
  Administered 2019-03-29 – 2019-03-31 (×2): 5 mg via ORAL
  Filled 2019-03-29 (×3): qty 1

## 2019-03-29 MED ORDER — CEFAZOLIN SODIUM-DEXTROSE 2-4 GM/100ML-% IV SOLN
2.0000 g | Freq: Once | INTRAVENOUS | Status: AC
  Administered 2019-03-29: 19:00:00 2 g via INTRAVENOUS

## 2019-03-29 MED ORDER — KETOROLAC TROMETHAMINE 30 MG/ML IJ SOLN
INTRAMUSCULAR | Status: AC
  Filled 2019-03-29: qty 1

## 2019-03-29 MED ORDER — MAGNESIUM SULFATE 40 GM/1000ML IV SOLN
2.0000 g/h | INTRAVENOUS | Status: AC
  Administered 2019-03-29 – 2019-03-30 (×2): 2 g/h via INTRAVENOUS
  Filled 2019-03-29: qty 1000

## 2019-03-29 MED ORDER — NALBUPHINE HCL 10 MG/ML IJ SOLN: 5.0000 mg | INTRAMUSCULAR | Status: DC | PRN

## 2019-03-29 MED ORDER — EMTRICITABINE-TENOFOVIR AF 200-25 MG PO TABS
1.0000 | ORAL_TABLET | Freq: Every day | ORAL | Status: DC
  Administered 2019-03-30 – 2019-03-31 (×3): 1 via ORAL
  Filled 2019-03-29 (×4): qty 1

## 2019-03-29 MED ORDER — ACETAMINOPHEN 500 MG PO TABS: 1000.0000 mg | ORAL_TABLET | Freq: Once | ORAL | Status: DC

## 2019-03-29 MED ORDER — KETOROLAC TROMETHAMINE 30 MG/ML IJ SOLN: 30.0000 mg | Freq: Four times a day (QID) | INTRAMUSCULAR | Status: AC | PRN

## 2019-03-29 MED ORDER — SIMETHICONE 80 MG PO CHEW
80.0000 mg | CHEWABLE_TABLET | Freq: Three times a day (TID) | ORAL | Status: DC
  Administered 2019-03-30 – 2019-04-01 (×7): 80 mg via ORAL
  Filled 2019-03-29 (×7): qty 1

## 2019-03-29 MED ORDER — ACETAMINOPHEN 160 MG/5ML PO SOLN: 1000.0000 mg | Freq: Once | ORAL | Status: DC

## 2019-03-29 MED ORDER — SOD CITRATE-CITRIC ACID 500-334 MG/5ML PO SOLN: 30.0000 mL | Freq: Once | ORAL | Status: DC

## 2019-03-29 MED ORDER — NALOXONE HCL 4 MG/10ML IJ SOLN
1.0000 ug/kg/h | INTRAVENOUS | Status: DC | PRN
  Filled 2019-03-29: qty 5

## 2019-03-29 MED ORDER — ZIDOVUDINE 10 MG/ML IV SOLN
2.0000 mg/kg | Freq: Once | INTRAVENOUS | Status: AC
  Administered 2019-03-29: 15:00:00 140 mg via INTRAVENOUS
  Filled 2019-03-29: qty 14

## 2019-03-29 MED ORDER — ACETAMINOPHEN 500 MG PO TABS
1000.0000 mg | ORAL_TABLET | Freq: Four times a day (QID) | ORAL | Status: AC
  Administered 2019-03-29 – 2019-03-30 (×4): 1000 mg via ORAL
  Filled 2019-03-29 (×4): qty 2

## 2019-03-29 MED ORDER — ONDANSETRON HCL 4 MG/2ML IJ SOLN
INTRAMUSCULAR | Status: DC | PRN
  Administered 2019-03-29: 4 mg via INTRAVENOUS

## 2019-03-29 MED ORDER — KETOROLAC TROMETHAMINE 30 MG/ML IJ SOLN
30.0000 mg | Freq: Four times a day (QID) | INTRAMUSCULAR | Status: AC
  Administered 2019-03-30 (×3): 30 mg via INTRAVENOUS
  Filled 2019-03-29 (×3): qty 1

## 2019-03-29 MED ORDER — MORPHINE SULFATE (PF) 0.5 MG/ML IJ SOLN
INTRAMUSCULAR | Status: AC
  Filled 2019-03-29: qty 10

## 2019-03-29 MED ORDER — MEPERIDINE HCL 25 MG/ML IJ SOLN
INTRAMUSCULAR | Status: AC
  Filled 2019-03-29: qty 1

## 2019-03-29 MED ORDER — NALBUPHINE HCL 10 MG/ML IJ SOLN: 5.0000 mg | Freq: Once | INTRAMUSCULAR | Status: DC | PRN

## 2019-03-29 MED ORDER — DIBUCAINE (PERIANAL) 1 % EX OINT: 1.0000 "application " | TOPICAL_OINTMENT | CUTANEOUS | Status: DC | PRN

## 2019-03-29 MED ORDER — PRENATAL MULTIVITAMIN CH: 1.0000 | ORAL_TABLET | Freq: Every day | ORAL | Status: DC

## 2019-03-29 MED ORDER — NALOXONE HCL 0.4 MG/ML IJ SOLN: 0.4000 mg | INTRAMUSCULAR | Status: DC | PRN

## 2019-03-29 MED ORDER — DEXAMETHASONE SODIUM PHOSPHATE 4 MG/ML IJ SOLN
INTRAMUSCULAR | Status: DC | PRN
  Administered 2019-03-29: 4 mg via INTRAVENOUS

## 2019-03-29 MED ORDER — WITCH HAZEL-GLYCERIN EX PADS: 1.0000 "application " | MEDICATED_PAD | CUTANEOUS | Status: DC | PRN

## 2019-03-29 MED ORDER — OXYTOCIN 40 UNITS IN NORMAL SALINE INFUSION - SIMPLE MED
INTRAVENOUS | Status: DC | PRN
  Administered 2019-03-29: 600 mL via INTRAVENOUS

## 2019-03-29 MED ORDER — FAMOTIDINE IN NACL 20-0.9 MG/50ML-% IV SOLN: 20.0000 mg | Freq: Once | INTRAVENOUS | Status: DC

## 2019-03-29 MED ORDER — SOD CITRATE-CITRIC ACID 500-334 MG/5ML PO SOLN
30.0000 mL | Freq: Once | ORAL | Status: AC
  Administered 2019-03-29: 30 mL via ORAL

## 2019-03-29 MED ORDER — FENTANYL CITRATE (PF) 100 MCG/2ML IJ SOLN
INTRAMUSCULAR | Status: DC | PRN
  Administered 2019-03-29: 150 ug via INTRATHECAL

## 2019-03-29 MED ORDER — FENTANYL CITRATE (PF) 100 MCG/2ML IJ SOLN: 25.0000 ug | INTRAMUSCULAR | Status: DC | PRN

## 2019-03-29 MED ORDER — OXYTOCIN 40 UNITS IN NORMAL SALINE INFUSION - SIMPLE MED: 2.5000 [IU]/h | INTRAVENOUS | Status: AC

## 2019-03-29 MED ORDER — KETOROLAC TROMETHAMINE 30 MG/ML IJ SOLN
30.0000 mg | Freq: Once | INTRAMUSCULAR | Status: AC
  Administered 2019-03-29: 21:00:00 30 mg via INTRAVENOUS

## 2019-03-29 MED ORDER — PHENYLEPHRINE HCL-NACL 20-0.9 MG/250ML-% IV SOLN
INTRAVENOUS | Status: DC | PRN
  Administered 2019-03-29: 60 ug/min via INTRAVENOUS

## 2019-03-29 MED ORDER — DEXAMETHASONE SODIUM PHOSPHATE 4 MG/ML IJ SOLN
INTRAMUSCULAR | Status: AC
  Filled 2019-03-29: qty 1

## 2019-03-29 MED ORDER — LABETALOL HCL 5 MG/ML IV SOLN: 40.0000 mg | INTRAVENOUS | Status: DC | PRN

## 2019-03-29 MED ORDER — SOD CITRATE-CITRIC ACID 500-334 MG/5ML PO SOLN
ORAL | Status: AC
  Filled 2019-03-29: qty 30

## 2019-03-29 MED ORDER — LACTATED RINGERS IV SOLN: INTRAVENOUS | Status: DC

## 2019-03-29 MED ORDER — SOD CITRATE-CITRIC ACID 500-334 MG/5ML PO SOLN: 30.0000 mL | ORAL | Status: DC

## 2019-03-29 MED ORDER — CEFAZOLIN (ANCEF) 1 G IV SOLR: 2.0000 g | INTRAVENOUS | Status: DC

## 2019-03-29 MED ORDER — LABETALOL HCL 5 MG/ML IV SOLN: 80.0000 mg | INTRAVENOUS | Status: DC | PRN

## 2019-03-29 MED ORDER — ONDANSETRON HCL 4 MG/2ML IJ SOLN
INTRAMUSCULAR | Status: AC
  Filled 2019-03-29: qty 2

## 2019-03-29 MED ORDER — FENTANYL CITRATE (PF) 100 MCG/2ML IJ SOLN: INTRAMUSCULAR | Status: DC | PRN

## 2019-03-29 MED ORDER — MENTHOL 3 MG MT LOZG: 1.0000 | LOZENGE | OROMUCOSAL | Status: DC | PRN

## 2019-03-29 MED ORDER — DIPHENHYDRAMINE HCL 25 MG PO CAPS: 25.0000 mg | ORAL_CAPSULE | ORAL | Status: DC | PRN

## 2019-03-29 MED ORDER — PROMETHAZINE HCL 25 MG/ML IJ SOLN: 6.2500 mg | INTRAMUSCULAR | Status: DC | PRN

## 2019-03-29 MED ORDER — CEFAZOLIN SODIUM-DEXTROSE 2-4 GM/100ML-% IV SOLN: 2.0000 g | Freq: Once | INTRAVENOUS | Status: DC

## 2019-03-29 MED ORDER — FAMOTIDINE IN NACL 20-0.9 MG/50ML-% IV SOLN
20.0000 mg | Freq: Once | INTRAVENOUS | Status: AC
  Administered 2019-03-29: 17:00:00 20 mg via INTRAVENOUS
  Filled 2019-03-29: qty 50

## 2019-03-29 MED ORDER — LABETALOL HCL 5 MG/ML IV SOLN
20.0000 mg | INTRAVENOUS | Status: DC | PRN
  Administered 2019-03-29: 21:00:00 20 mg via INTRAVENOUS

## 2019-03-29 MED ORDER — BUPIVACAINE IN DEXTROSE 0.75-8.25 % IT SOLN
INTRATHECAL | Status: DC | PRN
  Administered 2019-03-29: 1.4 mL via INTRATHECAL

## 2019-03-29 MED ORDER — HYDROCODONE-ACETAMINOPHEN 5-325 MG PO TABS
1.0000 | ORAL_TABLET | ORAL | Status: DC | PRN
  Administered 2019-04-01: 2 via ORAL
  Filled 2019-03-29: qty 2

## 2019-03-29 MED ORDER — DIPHENHYDRAMINE HCL 25 MG PO CAPS: 25.0000 mg | ORAL_CAPSULE | Freq: Four times a day (QID) | ORAL | Status: DC | PRN

## 2019-03-29 MED ORDER — MAGNESIUM SULFATE BOLUS VIA INFUSION
4.0000 g | Freq: Once | INTRAVENOUS | Status: AC
  Administered 2019-03-29: 21:00:00 4 g via INTRAVENOUS
  Filled 2019-03-29: qty 1000

## 2019-03-29 MED ORDER — CEFAZOLIN SODIUM-DEXTROSE 2-4 GM/100ML-% IV SOLN
INTRAVENOUS | Status: AC
  Filled 2019-03-29: qty 100

## 2019-03-29 MED ORDER — MAGNESIUM SULFATE 40 GM/1000ML IV SOLN
INTRAVENOUS | Status: AC
  Filled 2019-03-29: qty 1000

## 2019-03-29 MED ORDER — SODIUM CHLORIDE 0.9 % IR SOLN
Status: DC | PRN
  Administered 2019-03-29: 1000 mL

## 2019-03-29 MED ORDER — SODIUM CHLORIDE 0.9% FLUSH: 3.0000 mL | INTRAVENOUS | Status: DC | PRN

## 2019-03-29 MED ORDER — ZIDOVUDINE 10 MG/ML IV SOLN
1.0000 mg/kg/h | INTRAVENOUS | Status: DC
  Administered 2019-03-29: 1 mg/kg/h via INTRAVENOUS
  Filled 2019-03-29 (×2): qty 40

## 2019-03-29 MED ORDER — IBUPROFEN 800 MG PO TABS
800.0000 mg | ORAL_TABLET | Freq: Three times a day (TID) | ORAL | Status: DC
  Administered 2019-03-30 – 2019-04-01 (×5): 800 mg via ORAL
  Filled 2019-03-29 (×6): qty 1

## 2019-03-29 MED ORDER — DIPHENHYDRAMINE HCL 50 MG/ML IJ SOLN: 12.5000 mg | INTRAMUSCULAR | Status: DC | PRN

## 2019-03-29 MED ORDER — LABETALOL HCL 5 MG/ML IV SOLN
INTRAVENOUS | Status: AC
  Filled 2019-03-29: qty 4

## 2019-03-29 MED ORDER — SIMETHICONE 80 MG PO CHEW
80.0000 mg | CHEWABLE_TABLET | ORAL | Status: DC
  Administered 2019-03-29 – 2019-04-01 (×3): 80 mg via ORAL
  Filled 2019-03-29 (×3): qty 1

## 2019-03-29 MED ORDER — ONDANSETRON HCL 4 MG/2ML IJ SOLN: 4.0000 mg | Freq: Three times a day (TID) | INTRAMUSCULAR | Status: DC | PRN

## 2019-03-29 MED ORDER — SIMETHICONE 80 MG PO CHEW: 80.0000 mg | CHEWABLE_TABLET | ORAL | Status: DC | PRN

## 2019-03-29 MED ORDER — PRENATAL MULTIVITAMIN CH
1.0000 | ORAL_TABLET | Freq: Every day | ORAL | Status: DC
  Administered 2019-03-30 – 2019-04-01 (×3): 1 via ORAL
  Filled 2019-03-29 (×3): qty 1

## 2019-03-29 MED ORDER — SENNOSIDES-DOCUSATE SODIUM 8.6-50 MG PO TABS
2.0000 | ORAL_TABLET | ORAL | Status: DC
  Administered 2019-03-29 – 2019-04-01 (×3): 2 via ORAL
  Filled 2019-03-29 (×3): qty 2

## 2019-03-29 MED ORDER — MEPERIDINE HCL 25 MG/ML IJ SOLN
INTRAMUSCULAR | Status: DC | PRN
  Administered 2019-03-29 (×2): 12.5 mg via INTRAVENOUS

## 2019-03-29 MED ORDER — COCONUT OIL OIL: 1.0000 "application " | TOPICAL_OIL | Status: DC | PRN

## 2019-03-29 MED ORDER — MORPHINE SULFATE (PF) 0.5 MG/ML IJ SOLN
INTRAMUSCULAR | Status: DC | PRN
  Administered 2019-03-29: .15 mg via INTRATHECAL

## 2019-03-29 MED ORDER — FENTANYL CITRATE (PF) 100 MCG/2ML IJ SOLN
INTRAMUSCULAR | Status: AC
  Filled 2019-03-29: qty 2

## 2019-03-29 MED ORDER — DOLUTEGRAVIR SODIUM 50 MG PO TABS
50.0000 mg | ORAL_TABLET | Freq: Every day | ORAL | Status: DC
  Administered 2019-03-30 – 2019-03-31 (×3): 50 mg via ORAL
  Filled 2019-03-29 (×4): qty 1

## 2019-03-29 MED ORDER — ENOXAPARIN SODIUM 40 MG/0.4ML ~~LOC~~ SOLN
40.0000 mg | SUBCUTANEOUS | Status: DC
  Administered 2019-03-30 – 2019-04-01 (×3): 40 mg via SUBCUTANEOUS
  Filled 2019-03-29 (×3): qty 0.4

## 2019-03-29 MED ORDER — HYDRALAZINE HCL 20 MG/ML IJ SOLN: 10.0000 mg | INTRAMUSCULAR | Status: DC | PRN

## 2019-03-29 SURGICAL SUPPLY — 38 items
BENZOIN TINCTURE PRP APPL 2/3 (GAUZE/BANDAGES/DRESSINGS) ×3 IMPLANT
CHLORAPREP W/TINT 26ML (MISCELLANEOUS) ×3 IMPLANT
CLAMP CORD UMBIL (MISCELLANEOUS) IMPLANT
CLOSURE STERI STRIP 1/2 X4 (GAUZE/BANDAGES/DRESSINGS) ×1 IMPLANT
CLOSURE WOUND 1/2 X4 (GAUZE/BANDAGES/DRESSINGS) ×1
CLOTH BEACON ORANGE TIMEOUT ST (SAFETY) ×3 IMPLANT
DRSG OPSITE POSTOP 4X10 (GAUZE/BANDAGES/DRESSINGS) ×3 IMPLANT
DRSG OPSITE POSTOP 4X12 (GAUZE/BANDAGES/DRESSINGS) ×2 IMPLANT
ELECT REM PT RETURN 9FT ADLT (ELECTROSURGICAL) ×3
ELECTRODE REM PT RTRN 9FT ADLT (ELECTROSURGICAL) ×1 IMPLANT
EXTRACTOR VACUUM M CUP 4 TUBE (SUCTIONS) IMPLANT
EXTRACTOR VACUUM M CUP 4' TUBE (SUCTIONS)
GLOVE BIOGEL PI IND STRL 7.0 (GLOVE) ×2 IMPLANT
GLOVE BIOGEL PI INDICATOR 7.0 (GLOVE) ×4
GLOVE ECLIPSE 7.0 STRL STRAW (GLOVE) ×6 IMPLANT
GOWN STRL REUS W/TWL LRG LVL3 (GOWN DISPOSABLE) ×6 IMPLANT
KIT ABG SYR 3ML LUER SLIP (SYRINGE) IMPLANT
NDL HYPO 25X5/8 SAFETYGLIDE (NEEDLE) IMPLANT
NEEDLE HYPO 22GX1.5 SAFETY (NEEDLE) ×3 IMPLANT
NEEDLE HYPO 25X5/8 SAFETYGLIDE (NEEDLE) IMPLANT
NS IRRIG 1000ML POUR BTL (IV SOLUTION) ×3 IMPLANT
PACK C SECTION WH (CUSTOM PROCEDURE TRAY) ×3 IMPLANT
PAD ABD 7.5X8 STRL (GAUZE/BANDAGES/DRESSINGS) ×3 IMPLANT
PAD OB MATERNITY 4.3X12.25 (PERSONAL CARE ITEMS) ×3 IMPLANT
PENCIL SMOKE EVAC W/HOLSTER (ELECTROSURGICAL) ×3 IMPLANT
RTRCTR C-SECT PINK 25CM LRG (MISCELLANEOUS) ×3 IMPLANT
SPONGE GAUZE 4X4 12PLY STER LF (GAUZE/BANDAGES/DRESSINGS) ×3 IMPLANT
STRIP CLOSURE SKIN 1/2X4 (GAUZE/BANDAGES/DRESSINGS) ×2 IMPLANT
SUT MNCRL 0 VIOLET CTX 36 (SUTURE) ×2 IMPLANT
SUT MONOCRYL 0 CTX 36 (SUTURE) ×4
SUT PROLENE 1 CT (SUTURE) ×2 IMPLANT
SUT VIC AB 0 CTX 36 (SUTURE) ×2
SUT VIC AB 0 CTX36XBRD ANBCTRL (SUTURE) ×1 IMPLANT
SUT VIC AB 4-0 KS 27 (SUTURE) ×3 IMPLANT
SYR 30ML LL (SYRINGE) ×3 IMPLANT
TOWEL OR 17X24 6PK STRL BLUE (TOWEL DISPOSABLE) ×3 IMPLANT
TRAY FOLEY W/BAG SLVR 14FR LF (SET/KITS/TRAYS/PACK) ×3 IMPLANT
WATER STERILE IRR 1000ML POUR (IV SOLUTION) ×3 IMPLANT

## 2019-03-29 NOTE — Progress Notes (Signed)
11:02 [place] not connected virtually for her virtual visit. I called her to complete her visit.  I connected with  [place] on 03/29/19 at 11:00 AM EST by telephone and verified that I am speaking with the correct person using two identifiers.   I discussed the limitations, risks, security and privacy concerns of performing an evaluation and management service by telephone and the availability of in person appointments. I also discussed with the patient that there may be a patient responsible charge related to this service. The patient expressed understanding and agreed to proceed.  Pattijo Juste,RN 03/29/2019  11:08 AM   We discussed yesterday we tried to reach her for bp check because of alert from [employer] . She had send MyChart message back that her bp was 122/ 71 when she rechecked it. Today I had her check her bp and it was 140/94, she denies headache, visual changes, or edema. She does report she had a mild headache yesterday that releived with rest.  I asked her to recheck her bp again and it was 122/91. She has been evaluated recently for IUGR and oligo at the hospital. I discussed with Dr. Ernestina Patches and advised patient to go to hospital for evaluation for possible Pre-eclampisa or GHTN.  She reported she  did have CMET drawn  Yesterday and I informed her it was normal but that due to her elevated BP's today , yesterday and headache yesterday that she needs to be reevaluated. I explained this can develop suddenly or slowly and can be very serious for her and her baby. She voices understanding and states will go back to the hospital. Zayley Arras,RN

## 2019-03-29 NOTE — Discharge Summary (Addendum)
Postpartum Discharge Summary  Date of Service updated 04/01/2019      Patient Name: Kimberly Bishop DOB: [birth year] MRN: [id number]  Date of admission: 03/29/2019 Delivering Provider: Jonnie Kind   Date of discharge: 04/01/2019  Admitting diagnosis: Mild pre-eclampsia [O14.00] HIV (human immunodeficiency virus infection) ([place]) [B20] Intrauterine pregnancy: [redacted]w[redacted]d    Secondary diagnosis:  Active Problems:   HIV affecting pregnancy, antepartum   At risk for opportunistic infections   Supervision of high risk pregnancy, antepartum   ASCUS with positive high risk HPV cervical   CIN I (cervical intraepithelial neoplasia I)   Rubella nonimmune status, delivered, current hospitalization   Oligohydramnios antepartum   IUGR (intrauterine growth restriction) affecting care of mother   Mild pre-eclampsia   HIV [place] immunodeficiency virus infection) ([place]  Additional problems: none     Discharge diagnosis: Preterm Pregnancy Delivered                                                                                                Post partum procedures:no  Augmentation: n/a  Complications: None  Hospital course:  Sceduled C/S   29y.o. yo G2P0010 at 320w3das admitted to the hospital 03/29/2019 for scheduled cesarean section with the following indication:uncontrolled HIV with VL>1000; IUGR 6%, oligohydramnios.  Membrane Rupture Time/Date: 7:32 PM ,03/29/2019   Patient delivered a Viable infant.03/29/2019  Details of operation can be found in separate operative note.  Pateint had an uncomplicated postpartum course.  She is ambulating, tolerating a regular diet, passing flatus, and urinating well. Patient is discharged home in stable condition on  03/31/19        Delivery time: 7:33 PM    Magnesium Sulfate received: No BMZ received: Yes Rhophylac:N/A MMR:No Transfusion:No  Physical exam  Vitals:   03/30/19 1929 03/30/19 2109 03/31/19 0536 03/31/19 0748  BP: 119/64 (!)  105/55 (!) 120/57 119/65  Pulse: 75  73 77  Resp: _0 Temp: (!) 97.3 F (36.3 C)  98.1 F (36.7 C) 97.9 F (36.6 C)  TempSrc: Oral  Oral Oral  SpO2: 98%  99% 100%  Weight:      Height:       General: alert, cooperative and no distress Lochia: appropriate Uterine Fundus: firm Incision: Dressing is clean, dry, and intact DVT Evaluation: No evidence of DVT seen on physical exam. Labs: Lab Results  Component Value Date   WBC 15.3 (H) 03/30/2019   HGB 8.6 (L) 03/30/2019   HCT 27.2 (L) 03/30/2019   MCV 82.4 03/30/2019   PLT 336 03/30/2019   CMP Latest Ref Rng & Units 03/29/2019  Glucose 70 - 99 mg/dL 77  BUN 6 - 20 mg/dL 12  Creatinine 0.44 - 1.00 mg/dL 0.92  Sodium 135 - 145 mmol/L 134(L)  Potassium 3.5 - 5.1 mmol/L 4.6  Chloride 98 - 111 mmol/L 106  CO2 22 - 32 mmol/L 19(L)  Calcium 8.9 - 10.3 mg/dL 8.4(L)  Total Protein 6.5 - 8.1 g/dL 7.0  Total Bilirubin 0.3 - 1.2 mg/dL 0.5  Alkaline Phos 38 - 126 U/L 183(H)  AST 15 -  41 U/L 40  ALT 0 - 44 U/L 20    Discharge instruction: per After Visit Summary and "Baby and Me Booklet".  After visit meds:  Allergies as of 03/31/2019   No Known Allergies     Medication List    STOP taking these medications   calcium carbonate 500 MG chewable tablet Commonly known as: TUMS - dosed in mg elemental calcium   cyclobenzaprine 10 MG tablet Commonly known as: FLEXERIL     TAKE these medications   Blood Pressure Monitoring Devi 1 Device by Does not apply route once a week. ICD 10: O09.90   cephALEXin 500 MG capsule Commonly known as: KEFLEX Take 1 capsule (500 mg total) by mouth 4 (four) times daily.   emtricitabine-tenofovir 200-300 MG tablet Commonly known as: TRUVADA Take 1 tablet by mouth daily.   enalapril 5 MG tablet Commonly known as: VASOTEC Take 1 tablet (5 mg total) by mouth at bedtime.   HYDROcodone-acetaminophen 5-325 MG tablet Commonly known as: NORCO/VICODIN Take 1-2 tablets by mouth every 4  (four) hours as needed for moderate pain.   ibuprofen 800 MG tablet Commonly known as: ADVIL Take 1 tablet (800 mg total) by mouth every 8 (eight) hours.   prenatal multivitamin Tabs tablet Take 1 tablet by mouth daily at 12 noon.   Tivicay 50 MG tablet Generic drug: dolutegravir Take 1 tablet (50 mg total) by mouth daily.       Diet: routine diet  Activity: Advance as tolerated. Pelvic rest for 6 weeks.   Outpatient follow up:6 weeks Follow up Appt: Future Appointments  Date Time Provider [place]  04/01/2019  8:15 AM [facility] NST [facility] WOC  04/01/2019  9:35 AM Truett Mainland, DO [facility] [place]  06/27/2019  1:45 PM Doren Custard, Melton Krebs, NP [facility]   Follow up Visit: [place] for [facility]. Call in 1 week(s).   Specialty: Obstetrics and Gynecology Why: bp and wound check Contact information: [address] [place], [place] [id number] [place] [address] [phone]          Please schedule this patient for Postpartum visit in: 6 weeks with the following provider: Any provider In-Person For C/S patients schedule nurse incision check in weeks 2 weeks: yes High risk pregnancy complicated by: uncontrolled HIV infection, IUGR, oligo Delivery mode:  CS Anticipated Birth Control:  other/unsure PP Procedures needed: Incision check  Schedule Integrated BH visit: no   Newborn Data: Live born female  Birth Weight:  2132 g APGAR: 39, 27  Newborn Delivery   Birth date/time: 03/29/2019 19:33:00 Delivery type: C-Section, Low Transverse Trial of labor: No C-section categorization: Primary      Baby Feeding: Bottle Disposition:home with mother   .td Woodroe Mode, MD

## 2019-03-29 NOTE — Anesthesia Procedure Notes (Signed)
Spinal  Patient location during procedure: OR Start time: 03/29/2019 7:08 PM End time: 03/29/2019 7:11 PM Staffing Performed: anesthesiologist  Anesthesiologist: Lyn Hollingshead, MD Preanesthetic Checklist Completed: patient identified, IV checked, site marked, risks and benefits discussed, surgical consent, monitors and equipment checked, pre-op evaluation and timeout performed Spinal Block Patient position: sitting Prep: DuraPrep and site prepped and draped Patient monitoring: continuous pulse ox and blood pressure Approach: midline Location: L3-4 Injection technique: single-shot Needle Needle type: Pencan  Needle gauge: 24 G Needle length: 10 cm Needle insertion depth: 5 cm Assessment Sensory level: T4

## 2019-03-29 NOTE — MAU Note (Signed)
Sent over because of high BP.  Denies HA, visual changes, epigastric pain or swelling.

## 2019-03-29 NOTE — H&P (Signed)
Obstetrics Admission History & Physical  03/29/2019 - 1:27 PM Primary OBGYN: [facility]  Chief Complaint: FGR with new mild pre-eclampsia   History of Present Illness  29 y.o. G2P0010 @ [redacted]w[redacted]d, with the above CC. Pregnancy complicated by: AIDS, FGR  [name] states that she has no s/s of pre-eclampsia or decreased FM.   See telephone note. Last food was yesterday, had some water at 11am today.   Review of Systems: as noted in the History of Present Illness.  Patient Active Problem List   Diagnosis Date Noted  . Oligohydramnios antepartum 03/26/2019  . IUGR (intrauterine growth restriction) affecting care of mother 03/26/2019  . Oligohydramnios 03/26/2019  . Rubella nonimmune status, delivered, current hospitalization 02/15/2019  . Hematuria, gross 01/08/2019  . Diastasis recti 12/06/2018  . CIN I (cervical intraepithelial neoplasia I) 11/28/2018  . ASCUS with positive high risk HPV cervical 11/26/2018  . Supervision of high risk pregnancy, antepartum 10/02/2018  . Encounter for long-term (current) use of high-risk medication 03/28/2017  . Molluscum contagiosum 03/22/2016  . At risk for opportunistic infections 03/22/2016  . HIV affecting pregnancy, antepartum 07/03/2012     PMHx:  Past Medical History:  Diagnosis Date  . AIDS (acquired immunodeficiency syndrome), CD4 <=200 ([place])   . History of kidney stones   . HIV (human immunodeficiency virus infection) ([place])   . Immune deficiency disorder [facility])    PSHx:  Past Surgical History:  Procedure Laterality Date  . APPENDECTOMY  2011  . CYSTOSCOPY W/ URETERAL STENT PLACEMENT Right 11/03/2016   Procedure: CYSTOSCOPY WITH RETROGRADE PYELOGRAM/URETERAL STENT PLACEMENT;  Surgeon: Kathie Rhodes, MD;  Location: [facility];  Service: Urology;  Laterality: Right;  . CYSTOSCOPY/URETEROSCOPY/HOLMIUM LASER/STENT PLACEMENT Right 01/16/2017   Procedure: CYSTOSCOPY/URETEROSCOPY/ STONE EXTRACTION/STENT  removal;  Surgeon: Kathie Rhodes, MD;  Location: [facility];  Service: Urology;  Laterality: Right;  . IR DIL URETER RIGHT  12/05/2016  . IR NEPHROSTOMY PLACEMENT RIGHT  11/07/2016  . IR URETERAL STENT PLACEMENT EXISTING ACCESS RIGHT  12/05/2016  . NEPHROLITHOTOMY Right 12/05/2016   Procedure: NEPHROLITHOTOMY PERCUTANEOUS;  Surgeon: Kathie Rhodes, MD;  Location: [facility];  Service: Urology;  Laterality: Right;   Medications:  Medications Prior to Admission  Medication Sig Dispense Refill Last Dose  . Blood Pressure Monitoring DEVI 1 Device by Does not apply route once a week. ICD 10: O09.90 1 Device 0   . calcium carbonate (TUMS - DOSED IN MG ELEMENTAL CALCIUM) 500 MG chewable tablet Chew 2 tablets by mouth daily as needed for indigestion or heartburn.     . cephALEXin (KEFLEX) 500 MG capsule Take 1 capsule (500 mg total) by mouth 4 (four) times daily. 28 capsule 0   . cyclobenzaprine (FLEXERIL) 10 MG tablet Take 0.5-1 tablets (5-10 mg total) by mouth 3 (three) times daily as needed for muscle spasms. 10 tablet 0   . dolutegravir (TIVICAY) 50 MG tablet Take 1 tablet (50 mg total) by mouth daily. 30 tablet 1   . emtricitabine-tenofovir (TRUVADA) 200-300 MG tablet Take 1 tablet by mouth daily. 30 tablet 1   . Prenatal Vit-Fe Fumarate-FA (PRENATAL MULTIVITAMIN) TABS tablet Take 1 tablet by mouth daily at 12 noon.        Allergies: has No Known Allergies. OBHx:  OB History  Gravida Para Term Preterm AB Living  2 0 0 0 1 0  SAB TAB Ectopic Multiple Live Births  1 0 0 0      # Outcome Date GA Lbr Len/2nd  Weight Sex Delivery Anes PTL Lv  2 Current           1 SAB 01/02/13 [redacted]w[redacted]d                FHx:  Family History  Adopted: Yes  Problem Relation Age of Onset  . Hypertension Mother   . Autism Brother    Soc Hx:  Social History   Socioeconomic History  . Marital status: Single    Spouse name: Not on file  . Number of children: Not on file  . Years of education: Not on file  . Highest  education level: Not on file  Occupational History    Comment: unemployed  Tobacco Use  . Smoking status: Never Smoker  . Smokeless tobacco: Never Used  Substance and Sexual Activity  . Alcohol use: Not Currently    Comment: occasional - last use 08/2018  . Drug use: Yes    Types: Marijuana    Comment: last use 08/24/18  . Sexual activity: Yes    Partners: Male    Birth control/protection: None  Other Topics Concern  . Not on file  Social History Narrative  . Not on file   Social Determinants of Health   Financial Resource Strain:   . Difficulty of Paying Living Expenses: Not on file  Food Insecurity: No Food Insecurity  . Worried About Charity fundraiser in the Last Year: Never true  . Ran Out of Food in the Last Year: Never true  Transportation Needs: No Transportation Needs  . Lack of Transportation (Medical): No  . Lack of Transportation (Non-Medical): No  Physical Activity:   . Days of Exercise per Week: Not on file  . Minutes of Exercise per Session: Not on file  Stress:   . Feeling of Stress : Not on file  Social Connections:   . Frequency of Communication with Friends and Family: Not on file  . Frequency of Social Gatherings with Friends and Family: Not on file  . Attends Religious Services: Not on file  . Active Member of Clubs or Organizations: Not on file  . Attends Archivist Meetings: Not on file  . Marital Status: Not on file  Intimate Partner Violence: Not At Risk  . Fear of Current or Ex-Partner: No  . Emotionally Abused: No  . Physically Abused: No  . Sexually Abused: No    Objective     Current Vital Signs 24h Vital Sign Ranges  T   No data recorded  BP 130/83 BP  Min: 130/83  Max: 130/83  HR 76 Pulse  Avg: 76  Min: 76  Max: 76  RR 18 Resp  Avg: 18  Min: 18  Max: 18  SaO2 100 %   SpO2  Avg: 100 %  Min: 100 %  Max: 100 %       24 Hour I/O Current Shift I/O  Time Ins Outs No intake/output data recorded. No intake/output data  recorded.     General: Well nourished, well developed female in no acute distress.  Skin:  Warm and dry.  Cardiovascular: S1, S2 normal, no murmur, rub or gallop, regular rate and rhythm Respiratory:  Clear to auscultation bilateral. Normal respiratory effort Abdomen: nttp Neuro/Psych:  Normal mood and affect.   Labs  Recent Labs  Lab 03/26/19 1257 03/28/19 0329  WBC 8.9 9.5  HGB 10.2* 9.6*  HCT 32.4* 30.0*  PLT 408* 375    Recent Labs  Lab 03/28/19 0329  NA 134*  K 4.1  CL 105  CO2 19*  BUN 10  CREATININE 0.80  CALCIUM 9.2  PROT 7.7  BILITOT 0.6  ALKPHOS 190*  ALT 14  AST 22  GLUCOSE 106*    Radiology See prior H&P  Assessment & Plan   29 y.o. G2P0010 @ [redacted]w[redacted]d with mild pre-x and FGR. Pt stable Pt amenable to primary c-section given unknown VL and all priors have been above 1000. Will azt protocol. OR aware. Rpt pre-eclampsia labs drawn. No s/s of severe features.  Durene Romans MD Attending Center for [facility])

## 2019-03-29 NOTE — Anesthesia Preprocedure Evaluation (Addendum)
Anesthesia Evaluation  Patient identified by MRN, date of birth, ID band Patient awake    Reviewed: Allergy & Precautions, NPO status , Patient's Chart, lab work & pertinent test results  History of Anesthesia Complications Negative for: history of anesthetic complications  Airway Mallampati: I       Dental no notable dental hx. (+) Teeth Intact   Pulmonary neg pulmonary ROS,    Pulmonary exam normal breath sounds clear to auscultation       Cardiovascular Normal cardiovascular exam Rhythm:Regular Rate:Normal     Neuro/Psych negative neurological ROS  negative psych ROS   GI/Hepatic negative GI ROS, Neg liver ROS,   Endo/Other  negative endocrine ROS  Renal/GU   negative genitourinary   Musculoskeletal negative musculoskeletal ROS (+)   Abdominal Normal abdominal exam  (+)   Peds  Hematology  (+) HIV (high viral load, plan to give AZT preop),   Anesthesia Other Findings Day of surgery medications reviewed with patient.  Reproductive/Obstetrics (+) Pregnancy (IUGR, preE)                            Anesthesia Physical Anesthesia Plan  ASA: III  Anesthesia Plan: Spinal   Post-op Pain Management:    Induction:   PONV Risk Score and Plan: 4 or greater and Treatment may vary due to age or medical condition, Ondansetron and Dexamethasone  Airway Management Planned: Natural Airway  Additional Equipment: None  Intra-op Plan:   Post-operative Plan:   Informed Consent: I have reviewed the patients History and Physical, chart, labs and discussed the procedure including the risks, benefits and alternatives for the proposed anesthesia with the patient or authorized representative who has indicated his/her understanding and acceptance.       Plan Discussed with: CRNA  Anesthesia Plan Comments:        Anesthesia Quick Evaluation

## 2019-03-29 NOTE — Transfer of Care (Signed)
Immediate Anesthesia Transfer of Care Note  Patient: Kimberly Bishop  Procedure(s) Performed: CESAREAN SECTION (N/A )  Patient Location: PACU  Anesthesia Type:Spinal  Level of Consciousness: awake, alert  and patient cooperative  Airway & Oxygen Therapy: Patient Spontanous Breathing  Post-op Assessment: Report given to RN and Post -op Vital signs reviewed and stable  Post vital signs: Reviewed and stable  Last Vitals:  Vitals Value Taken Time  BP 136/89 03/29/19 2018  Temp    Pulse 81 03/29/19 2023  Resp 21 03/29/19 2023  SpO2 100 % 03/29/19 2023  Vitals shown include unvalidated device data.  Last Pain:  Vitals:   03/29/19 1517  TempSrc: Oral  PainSc:          Complications: No apparent anesthesia complications

## 2019-03-29 NOTE — Telephone Encounter (Signed)
OB Note I was signing Kimberly Bishop's d/c summary and I saw that she was seen early yesterday morning and dx with mild pre-eclampsia. Given her FGR, h/o oligo and being 36wks I called Dr. Annamaria Boots and he agreed with going ahead with delivery, which would be via c/s given her VL isn't back. I called her mother at [phone] who said her phone is off but she can text her and she said that she was told her BPs are high and she was going to the hospital. I told her to text her to tell her not to eat or drink anything. MAU aware.  Durene Romans MD Attending Center for [employer] (Faculty Practice) 03/29/2019 Time: [phone]

## 2019-03-29 NOTE — Op Note (Signed)
Kimberly Bishop PROCEDURE DATE: 03/29/2019  PREOPERATIVE DIAGNOSES: Intrauterine pregnancy at [redacted]w[redacted]d weeks gestation; IUGR 6%, oligohydramnios, HIV with viral load >1000  POSTOPERATIVE DIAGNOSES: The same  PROCEDURE: Primary Low Transverse Cesarean Section  SURGEON:  Dr. Jonnie Kind  FELLOW:  Dr. Clarnce Flock  ANESTHESIOLOGY TEAM: Anesthesiologist: Lyn Hollingshead, MD CRNA: Sandrea Matte, CRNA  INDICATIONS: Kimberly Bishop is a 29 y.o. G2P0010 at [redacted]w[redacted]d here for cesarean section secondary to the indications listed under preoperative diagnoses; please see preoperative note for further details.  The risks of cesarean section were discussed with the patient including but were not limited to: bleeding which may require transfusion or reoperation; infection which may require antibiotics; injury to bowel, bladder, ureters or other surrounding organs; injury to the fetus; need for additional procedures including hysterectomy in the event of a life-threatening hemorrhage; placental abnormalities wth subsequent pregnancies, incisional problems, thromboembolic phenomenon and other postoperative/anesthesia complications.   The patient concurred with the proposed plan, giving informed written consent for the procedure.    FINDINGS:  Viable female infant in cephalic presentation.  Apgars 8 and 9.  Clear amniotic fluid.  Intact placenta, three vessel cord.  Normal uterus, fallopian tubes and ovaries bilaterally.  ANESTHESIA: Spinal INTRAVENOUS FLUIDS: 1000 ml   ESTIMATED BLOOD LOSS: 182 ml URINE OUTPUT:  150 ml SPECIMENS: Placenta sent to L&D COMPLICATIONS: None immediate  PROCEDURE IN DETAIL:  The patient preoperatively received intravenous antibiotics and had sequential compression devices applied to her lower extremities.  She was then taken to the operating room where spinal anesthesia was administered and was found to be adequate. She was then placed in a dorsal supine position  with a leftward tilt, and prepped and draped in a sterile manner.  A foley catheter was placed into her bladder and attached to constant gravity.  After an adequate timeout was performed, a Pfannenstiel skin incision was made with scalpel and carried through to the underlying layer of fascia. The fascia was incised in the midline, and this incision was extended bilaterally using the Mayo scissors.  Kocher clamps were applied to the superior aspect of the fascial incision and the underlying rectus muscles were dissected off bluntly and sharply.  A similar process was carried out on the inferior aspect of the fascial incision. The rectus muscles were separated in the midline and the peritoneum was entered bluntly. The Alexis self-retaining retractor was introduced into the abdominal cavity.  Attention was turned to the lower uterine segment where a low transverse hysterotomy was made with a scalpel and extended bilaterally bluntly.  The infant was successfully delivered, the cord was clamped and cut after one minute, and the infant was handed over to the awaiting neonatology team. Uterine massage was then administered, and the placenta delivered intact with a three-vessel cord. The uterus was then cleared of clots and debris.  The hysterotomy was closed with 0 Monocryl in a running locked fashion, and an imbricating layer was also placed with 0 Monocryl. The pelvis was cleared of all clot and debris. Hemostasis was confirmed on all surfaces.  The retractor was removed.  The peritoneum was closed with a 2-0 Vicryl running stitch. The fascia was then closed using 0 Vicryl in a running fashion.  The subcutaneous layer was irrigated, reapproximated with 2-0 plain gut interrupted stitches, and the skin was closed with a 4-0 Vicryl subcuticular stitch. The patient tolerated the procedure well. Sponge, instrument and needle counts were correct x 3.  She was taken to  the recovery room in stable condition.   Clarnce Flock MD/MPH OB Fellow [facility] Faculty Practice

## 2019-03-29 NOTE — MAU Note (Signed)
Does not need to be reswabbed per anes.

## 2019-03-30 ENCOUNTER — Encounter: Payer: Self-pay | Admitting: *Deleted

## 2019-03-30 LAB — CBC
HCT: 27.2 % — ABNORMAL LOW (ref 36.0–46.0)
Hemoglobin: 8.6 g/dL — ABNORMAL LOW (ref 12.0–15.0)
MCH: 26.1 pg (ref 26.0–34.0)
MCHC: 31.6 g/dL (ref 30.0–36.0)
MCV: 82.4 fL (ref 80.0–100.0)
Platelets: 336 10*3/uL (ref 150–400)
RBC: 3.3 MIL/uL — ABNORMAL LOW (ref 3.87–5.11)
RDW: 14.8 % (ref 11.5–15.5)
WBC: 15.3 10*3/uL — ABNORMAL HIGH (ref 4.0–10.5)
nRBC: 0.1 % (ref 0.0–0.2)

## 2019-03-30 LAB — HIV-1 RNA QUANT-NO REFLEX-BLD
HIV 1 RNA Quant: 136 copies/mL — ABNORMAL HIGH
HIV-1 RNA Quant, Log: 2.13 Log copies/mL — ABNORMAL HIGH

## 2019-03-30 NOTE — Progress Notes (Signed)
Subjective: Postpartum Day 1: Cesarean Delivery , primary cesarean for HIV viral load >1000/ml. Pt developed Severe Range pressures postpartum so was placed on Magnesium Sulfate.  Patient reports tolerating PO.  Foley to be removed this am.  Objective: Vital signs in last 24 hours: Temp:  [97.6 F (36.4 C)-98.7 F (37.1 C)] 97.6 F (36.4 C) (01/16 0831) Pulse Rate:  [67-89] 80 (01/16 0831) Resp:  [11-31] 18 (01/16 0831) BP: (104-171)/(62-106) 104/62 (01/16 0831) SpO2:  [94 %-100 %] 95 % (01/16 0831) Weight:  [69.9 kg] 69.9 kg (01/15 1259)  Physical Exam:  General: alert, cooperative and no distress Lochia: appropriate Uterine Fundus: firm Incision: healing well, no significant drainage, clean dressing DVT Evaluation: No evidence of DVT seen on physical exam.  Recent Labs    03/29/19 1415 03/30/19 0636  HGB 9.7* 8.6*  HCT 31.2* 27.2*    Assessment/Plan: Status post Cesarean section. Postoperative course complicated by postpartum pre-Eclampsia with Severe features(BP).   Mag Sulfate to be d/c at 9 pm. Routine pp care Breast feeding.  Jonnie Kind 03/30/2019, 9:48 AM

## 2019-03-30 NOTE — Progress Notes (Signed)
CSW acknowledges consult for further needs. CSW made aware that MOB is on mag and will remain ion mag until 9:15pm. Weekend CSW will follow once mom is off mag to reassess needs as.     [name]. [name], MSW, LCSW [facility] [phone]

## 2019-03-31 MED ORDER — HYDROCODONE-ACETAMINOPHEN 5-325 MG PO TABS: 1.0000 | ORAL_TABLET | ORAL | 0 refills | Status: DC | PRN

## 2019-03-31 MED ORDER — IBUPROFEN 800 MG PO TABS: 800.0000 mg | ORAL_TABLET | Freq: Three times a day (TID) | ORAL | 0 refills | Status: DC

## 2019-03-31 MED ORDER — ENALAPRIL MALEATE 5 MG PO TABS: 5.0000 mg | ORAL_TABLET | Freq: Every day | ORAL | 1 refills | Status: DC

## 2019-03-31 NOTE — Discharge Instructions (Signed)
Cesarean Delivery, Care After This sheet gives you information about how to care for yourself after your procedure. Your health care provider may also give you more specific instructions. If you have problems or questions, contact your health care provider. What can I expect after the procedure? After the procedure, it is common to have:  A small amount of blood or clear fluid coming from the incision.  Some redness, swelling, and pain in your incision area.  Some abdominal pain and soreness.  Vaginal bleeding (lochia). Even though you did not have a vaginal delivery, you will still have vaginal bleeding and discharge.  Pelvic cramps.  Fatigue. You may have pain, swelling, and discomfort in the tissue between your vagina and your anus (perineum) if:  Your C-section was unplanned, and you were allowed to labor and push.  An incision was made in the area (episiotomy) or the tissue tore during attempted vaginal delivery. Follow these instructions at home: Incision care   Follow instructions from your health care provider about how to take care of your incision. Make sure you: ? Wash your hands with soap and water before you change your bandage (dressing). If soap and water are not available, use hand sanitizer. ? If you have a dressing, change it or remove it as told by your health care provider. ? Leave stitches (sutures), skin staples, skin glue, or adhesive strips in place. These skin closures may need to stay in place for 2 weeks or longer. If adhesive strip edges start to loosen and curl up, you may trim the loose edges. Do not remove adhesive strips completely unless your health care provider tells you to do that.  Check your incision area every day for signs of infection. Check for: ? More redness, swelling, or pain. ? More fluid or blood. ? Warmth. ? Pus or a bad smell.  Do not take baths, swim, or use a hot tub until your health care provider says it's okay. Ask your health  care provider if you can take showers.  When you cough or sneeze, hug a pillow. This helps with pain and decreases the chance of your incision opening up (dehiscing). Do this until your incision heals. Medicines  Take over-the-counter and prescription medicines only as told by your health care provider.  If you were prescribed an antibiotic medicine, take it as told by your health care provider. Do not stop taking the antibiotic even if you start to feel better.  Do not drive or use heavy machinery while taking prescription pain medicine. Lifestyle  Do not drink alcohol. This is especially important if you are breastfeeding or taking pain medicine.  Do not use any products that contain nicotine or tobacco, such as cigarettes, e-cigarettes, and chewing tobacco. If you need help quitting, ask your health care provider. Eating and drinking  Drink at least 8 eight-ounce glasses of water every day unless told not to by your health care provider. If you breastfeed, you may need to drink even more water.  Eat high-fiber foods every day. These foods may help prevent or relieve constipation. High-fiber foods include: ? Whole grain cereals and breads. ? Brown rice. ? Beans. ? Fresh fruits and vegetables. Activity   If possible, have someone help you care for your baby and help with household activities for at least a few days after you leave the hospital.  Return to your normal activities as told by your health care provider. Ask your health care provider what activities are safe for   you.  Rest as much as possible. Try to rest or take a nap while your baby is sleeping.  Do not lift anything that is heavier than 10 lbs (4.5 kg), or the limit that you were told, until your health care provider says that it is safe.  Talk with your health care provider about when you can engage in sexual activity. This may depend on your: ? Risk of infection. ? How fast you heal. ? Comfort and desire to  engage in sexual activity. General instructions  Do not use tampons or douches until your health care provider approves.  Wear loose, comfortable clothing and a supportive and well-fitting bra.  Keep your perineum clean and dry. Wipe from front to back when you use the toilet.  If you pass a blood clot, save it and call your health care provider to discuss. Do not flush blood clots down the toilet before you get instructions from your health care provider.  Keep all follow-up visits for you and your baby as told by your health care provider. This is important. Contact a health care provider if:  You have: ? A fever. ? Bad-smelling vaginal discharge. ? Pus or a bad smell coming from your incision. ? Difficulty or pain when urinating. ? A sudden increase or decrease in the frequency of your bowel movements. ? More redness, swelling, or pain around your incision. ? More fluid or blood coming from your incision. ? A rash. ? Nausea. ? Little or no interest in activities you used to enjoy. ? Questions about caring for yourself or your baby.  Your incision feels warm to the touch.  Your breasts turn red or become painful or hard.  You feel unusually sad or worried.  You vomit.  You pass a blood clot from your vagina.  You urinate more than usual.  You are dizzy or light-headed. Get help right away if:  You have: ? Pain that does not go away or get better with medicine. ? Chest pain. ? Difficulty breathing. ? Blurred vision or spots in your vision. ? Thoughts about hurting yourself or your baby. ? New pain in your abdomen or in one of your legs. ? A severe headache.  You faint.  You bleed from your vagina so much that you fill more than one sanitary pad in one hour. Bleeding should not be heavier than your heaviest period. Summary  After the procedure, it is common to have pain at your incision site, abdominal cramping, and slight bleeding from your vagina.  Check  your incision area every day for signs of infection.  Tell your health care provider about any unusual symptoms.  Keep all follow-up visits for you and your baby as told by your health care provider. This information is not intended to replace advice given to you by your health care provider. Make sure you discuss any questions you have with your health care provider. Document Revised: 09/06/2017 Document Reviewed: 09/06/2017 Elsevier Patient Education  2020 [employer]  

## 2019-03-31 NOTE — Progress Notes (Signed)
Discharge cancelled due to her baby not being discharged today.

## 2019-03-31 NOTE — Clinical Social Work Maternal (Signed)
CLINICAL SOCIAL WORK MATERNAL/CHILD NOTE  Patient Details  Name: Kimberly Bishop MRN: [id number] Date of Birth: 09-23-1990  Date:  03/31/2019  Clinical Social Worker Initiating Note:   Nat Christen, [place] Date/Time: Initiated:   / 03/31/2019   Child's Name:    Kimberly Bishop  Biological Parents:    [place]  Need for Interpreter:    None  Reason for Referral:    HIV/AIDS & THC Use during pregnancy.  Address:  [address] [place] [address]    Phone number:  [phone] (home)     Additional phone number: None  Household Members/Support Persons (HM/SP):       HM/SP Name Relationship DOB or Age  HM/SP -1  Kimberly Bishop  FOB  58  HM/SP -2        HM/SP -3        HM/SP -4        HM/SP -5        HM/SP -6        HM/SP -7        HM/SP -8          Natural Supports (not living in the home):  Mother - [facility]  Professional Supports:   None  Employment:   None  Type of Work:  None   Education:    [employer]  Homebound arranged:  None  Financial Resources:    Medicaid, Physicist, medical, [place]  Other Resources:   None  Cultural/Religious Considerations Which May Impact Care:  None  Strengths:    Cooking, Theme park manager, Good Mother - [place] of experience with caring for children.  Psychotropic Medications:   None      Pediatrician:    Has not chosen  Pediatrician List: Provided  [place]      Pediatrician Fax Number:  N/A  Risk Factors/Current Problems:    None  Cognitive State:    Appropriate, Alert, Oriented.  Mood/Affect:   Appropriate   CSW Assessment:   CSW was able to meet with MOB at bedside to complete full assessment.  MOB was pleasant and cooperative, feeding baby at time of visit.  MOB admitted to having everything she needs at home to properly care for baby (diapers, formula, car seat,  crib, etc.)  CSW received a new consult on MOB due to MOB's diagnoses of HIV (Human Immunodeficiency Virus Infection), AIDS (Acquired Immunodeficiency Syndrome), since birth, as well as MOB admitting to [facility] (last used 08/24/2018).  MOB denies need for substance abuse treatment or resources.  Drug Detection Panel, Umbilical Cord Qualitative and THC - COOH, Cord Qualitative, performed on 03/30/2019, results are still pending.  Babies Rapid Urine Drug Screen was negative. MOB denies experiencing any symptoms of Anxiety, Depression and/or Post Partum Depression.  MOB also denies experiencing homicidal ideations, suicidal ideations or domestic violence.  MOB is not currently taking any psychotropic medications, nor was she interested in receiving any counseling and supportive services.  MOB denied having any risk factors with regards to being able to properly care for baby.  MOB reported that her strengths are cooking and caring for children, having a lot of experience in caring for nieces and nephews.  MOB is currently unemployed.  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  MOB lives in home with FOB, Kimberly Bishop.  MOB receives  financial assistance through [employer], [facility] and Medicaid.  MOB admitted to having a strong support system through her mother, Kimberly Bishop.   CSW Plan/Description:      CSW was able to confirm that MOB would like to follow-up at [facility] for HIV/AIDS Treatment.  Referral form faxed on 03/30/2019.   CSW provided education regarding the baby blues period vs. perinatal mood disorders, discussed treatment and gave resources for mental health follow up if concerns arise.  CSW recommends self-evaluation during the postpartum time period using the New Mom Checklist from Postpartum Progress and encouraged MOB to contact a medical professional if symptoms are noted at any time.   CSW provided review of Sudden Infant Death Syndrome  (SIDS) precautions.   CSW identifies no further need for intervention and no barriers to discharge at this time.  Nat Christen, BSW, MSW, [employer]  Licensed Holiday representative  [employer]  Mailing Address-[address] [place], [place] [address] Physical Address-[address] [place],  [address] Toll Free Main # [phone] Fax # [phone] Cell # [phone]  [email or web address]

## 2019-04-01 ENCOUNTER — Encounter: Payer: Medicaid Other | Admitting: Family Medicine

## 2019-04-01 ENCOUNTER — Other Ambulatory Visit: Payer: Medicaid Other

## 2019-04-01 NOTE — Progress Notes (Signed)
Subjective: Postpartum Day 3: Cesarean Delivery Patient reports incisional pain, tolerating PO and + flatus.    Objective: Vital signs in last 24 hours: Temp:  [97.9 F (36.6 C)-98.5 F (36.9 C)] 98 F (36.7 C) (01/18 0529) Pulse Rate:  [76-79] 76 (01/18 0529) Resp:  [18] 18 (01/18 0529) BP: (115-136)/(59-76) 131/76 (01/18 0529) SpO2:  [98 %-100 %] 99 % (01/18 0529)  Physical Exam:  General: alert, cooperative and no distress Lochia: appropriate Uterine Fundus: firm Incision: no significant drainage DVT Evaluation: No evidence of DVT seen on physical exam.  Recent Labs    03/29/19 1415 03/30/19 0636  HGB 9.7* 8.6*  HCT 31.2* 27.2*    Assessment/Plan: Status post Cesarean section. Doing well postoperatively.  Discharge home with standard precautions    D/C summary was updated, was to be discharged yesterday but was cancelled as the baby was to stay  Kimberly Bishop 04/01/2019, 7:45 AM

## 2019-04-01 NOTE — Progress Notes (Signed)
CSW spoke with [facility] to ensure that appointment has been scheduled for infant at this time. CSW provided MO and FOB with appointment time and directions. (April 05, 2019).      [name] Canna Nickelson, MSW, LCSW Women's and [place] at [place] [phone]

## 2019-04-02 NOTE — Anesthesia Postprocedure Evaluation (Signed)
Anesthesia Post Note  Patient: Kimberly Bishop  Procedure(s) Performed: CESAREAN SECTION (N/A )     Patient location during evaluation: PACU Anesthesia Type: Spinal Level of consciousness: awake Pain management: pain level controlled Vital Signs Assessment: post-procedure vital signs reviewed and stable Respiratory status: spontaneous breathing Cardiovascular status: stable Postop Assessment: no headache, no backache, spinal receding, patient able to bend at knees and no apparent nausea or vomiting Anesthetic complications: no    Last Vitals:  Vitals:   04/01/19 0753 04/01/19 1139  BP: 126/79 131/87  Pulse: 75 76  Resp: 18 17  Temp: 36.6 C 36.4 C  SpO2: 100% 99%    Last Pain:  Vitals:   04/01/19 1202  TempSrc:   PainSc: 0-No pain   Pain Goal:                   Huston Foley

## 2019-04-02 NOTE — Progress Notes (Signed)
Attestation of Attending Supervision of clinical support staff: I agree with the care provided to this patient and was available for any consultation.  I have reviewed the RN's note and chart. I was consulted and documentation reflect my recommendation.  Caren Macadam, MD, MPH, ABFM Attending [place] for [facility]

## 2019-04-03 ENCOUNTER — Other Ambulatory Visit: Payer: Medicaid Other

## 2019-04-05 ENCOUNTER — Inpatient Hospital Stay (HOSPITAL_COMMUNITY): Admit: 2019-04-05 | Payer: Medicaid Other | Admitting: Family Medicine

## 2019-04-15 ENCOUNTER — Ambulatory Visit (INDEPENDENT_AMBULATORY_CARE_PROVIDER_SITE_OTHER): Payer: Medicaid Other | Admitting: General Practice

## 2019-04-15 ENCOUNTER — Other Ambulatory Visit: Payer: Self-pay

## 2019-04-15 VITALS — BP 125/85 | HR 99 | Ht 62.0 in | Wt 141.0 lb

## 2019-04-15 DIAGNOSIS — Z5189 Encounter for other specified aftercare: Secondary | ICD-10-CM

## 2019-04-15 NOTE — Progress Notes (Signed)
Patient presents to office today for incision check following primary c-section on 1/15. Patient also had pre-e with pregnancy and was sent home on Vasotec 5mg  which she has been taking daily. BP is WNL today 125/85. Patient denies headaches, dizziness or blurry vision- no edema noted today.   Honeycomb dressing and steri strips were still present. Dressing and steri strips removed. Incision is well approximated and healing well. Small area of granulation tissue present- Dr Nehemiah Settle applied silver nitrate. Wound care and signs & symptoms of infection reviewed with patient. Patient will follow up at pp visit on 2/15.  Koren Bound RN BSN 04/15/19

## 2019-04-15 NOTE — Progress Notes (Signed)
Chart reviewed - agree with CMA/RN documentation.  ° °

## 2019-04-24 ENCOUNTER — Telehealth: Payer: Self-pay | Admitting: Pharmacy Technician

## 2019-04-24 NOTE — Telephone Encounter (Signed)
RCID Patient Advocate Encounter  Completed and sent [employer] Advancing Access application for Truvada for this patient who is uninsured.    Patient is approved 04/24/2019 through 05/22/2019.  BIN      [id number] PCN    [id number] GRP    [id number] ID        [id number]  Will submit application for additional months of assistance.  Also   Was successful in obtaining [employer] patient assistance for Tivicay.   RxBin: [id number] PCN: [id number] Member ID: [id number] Group ID: [id number]   Will submit application for additional months of assistance.  I have spoken with the patient.    The billing information is as follows and has been shared with [facility].  Venida Jarvis. Nadara Mustard [place] Patient [facility] for Infectious Disease Phone: [phone] Fax:  [phone]

## 2019-04-26 NOTE — Telephone Encounter (Signed)
RCID Patient Advocate Encounter   Received a fax from [place] that states patient is eligible to receive medication for up to 12 months starting 04/24/2019 while applying for adap.  Patient knows to call the office with questions or concerns.  Bartholomew Crews, CPhT Specialty Pharmacy Patient [facility] for Infectious Disease Phone: [phone] Fax: [phone] 04/26/2019 11:48 AM

## 2019-04-29 ENCOUNTER — Telehealth (INDEPENDENT_AMBULATORY_CARE_PROVIDER_SITE_OTHER): Payer: Medicaid Other | Admitting: Nurse Practitioner

## 2019-04-29 ENCOUNTER — Encounter: Payer: Self-pay | Admitting: Nurse Practitioner

## 2019-04-29 DIAGNOSIS — Z8751 Personal history of pre-term labor: Secondary | ICD-10-CM

## 2019-04-29 DIAGNOSIS — Z98891 History of uterine scar from previous surgery: Secondary | ICD-10-CM

## 2019-04-29 DIAGNOSIS — R87610 Atypical squamous cells of undetermined significance on cytologic smear of cervix (ASC-US): Secondary | ICD-10-CM

## 2019-04-29 DIAGNOSIS — R87810 Cervical high risk human papillomavirus (HPV) DNA test positive: Secondary | ICD-10-CM

## 2019-04-29 HISTORY — DX: Personal history of pre-term labor: Z87.51

## 2019-04-29 HISTORY — DX: History of uterine scar from previous surgery: Z98.891

## 2019-04-29 NOTE — Progress Notes (Signed)
3:55p- Called pt for My Chart visit, no answer, leftVM that will call back in 10 to 15 minutes.   4:04p:  I connected with@ on 04/29/19 at  3:55 PM EST by: MyChart and verified that I am speaking with the correct person using two identifiers.  Patient is located at [facility] and provider is located at [facility].     The purpose of this virtual visit is to provide medical care while limiting exposure to the novel coronavirus. I discussed the limitations, risks, security and privacy concerns of performing an evaluation and management service by MyChart and the availability of in person appointments. I also discussed with the patient that there may be a patient responsible charge related to this service. By engaging in this virtual visit, you consent to the provision of healthcare.  Additionally, you authorize for your insurance to be billed for the services provided during this visit.  The patient expressed understanding and agreed to proceed.  The following staff members participated in the virtual visit: Kimberly Bishop, CMA and Kimberly Server, NP  Post Partum Visit Note Subjective:    [name] is a 29 y.o. [phone] female who presents for a postpartum visit. She is 4 weeks postpartum following a low cervical transverse Cesarean section. I have fully reviewed the prenatal and intrapartum course. The delivery was at 36/3 gestational weeks. Outcome: primary cesarean section, low transverse incision due to mild pre-eclampsia and IUGR with oligohydramnios,  Anesthesia: spinal. Postpartum course has been good. 110 course has been stable - went home with mother at discharge and is gaining weight.Kimberly Bishop is feeding by bottle Kimberly Bishop quick start. Bleeding no bleeding. Bowel function is normal. Bladder function is normal. Patient is not sexually active. Contraception method is none. Postpartum depression screening: negative.  Client requests not to discuss contraception at this visit.  The following  portions of the patient's history were reviewed and updated as appropriate: allergies, current medications, past family history, past medical history, past social history, past surgical history and problem list.  Review of Systems Pertinent items noted in HPI and remainder of comprehensive ROS otherwise negative.   Objective:  There were no vitals filed for this visit. Self-Obtained       Assessment:    Stable postpartum exam. Pap smear not done at today's visit.  Last pap smear 10-2018 and results were ASCUS with positive High Risk HPV, Next pap due in 2 weeks.  Client has chronic health conditions - HIV, hypertension -  Plan:    1. Contraception: none - Client request not to discuss at this virtual visit. 2. Has follow up planned at Infectious Disease clinic in April.  Kimberly Bishop has approved medication for HIV for one year. 3. Follow up in: 2 weeks or as needed.   9 minutes of non-face-to-face time spent with the patient   Kimberly Bishop [facility] 04/29/2019 4:04 PM   Kimberly Server, RN, MSN, NP-BC Nurse Practitioner, [facility] for [employer], [employer] 04/29/2019 5:10 PM

## 2019-04-29 NOTE — Progress Notes (Signed)
Pt does not want to discuss Birth Control. 

## 2019-05-16 ENCOUNTER — Other Ambulatory Visit: Payer: Self-pay | Admitting: *Deleted

## 2019-05-16 DIAGNOSIS — Z21 Asymptomatic human immunodeficiency virus [HIV] infection status: Secondary | ICD-10-CM

## 2019-05-16 MED ORDER — BIKTARVY 50-200-25 MG PO TABS: 1.0000 | ORAL_TABLET | Freq: Every day | ORAL | 3 refills | Status: DC

## 2019-05-16 NOTE — Progress Notes (Signed)
Patient called Kimberly Bishop with issues getting her medication. Per Timmothy Sours, patient's medicaid is active. Adonis Brook will check to make sure it is full medicaid rather than family planning. Pharmacy has  Per chart, [name] wanted [place] to switch from Tivicay/Truvada to [place] after delivery.  RN confirmed with [name] and sent in new prescription for [place] to [facility].  Timmothy Sours will let the patient know that she can pick up medication today. Landis Gandy, RN

## 2019-05-17 ENCOUNTER — Telehealth: Payer: Self-pay

## 2019-05-17 ENCOUNTER — Telehealth: Payer: Self-pay | Admitting: Pharmacy Technician

## 2019-05-17 NOTE — Telephone Encounter (Signed)
John, Pharmacist called office for patient assistance coverage. Will need coverage information while ADAP is processing. Provided information to pharmacy. BIN      [id number] PCN    [id number] GRP    [id number] ID        [id number] Aundria Rud, CMA

## 2019-05-17 NOTE — Telephone Encounter (Signed)
RCID Patient Advocate Encounter  Patient medication has been switched to [place]. I spoke with the patient today and she will go today to [facility] to pick up her medication. I was unable to reach [facility] by phone, so I faxed the billing information to their secure fax. They should already have this processing information from the Truvada she previously filled.

## 2019-05-24 ENCOUNTER — Encounter: Payer: Self-pay | Admitting: Nurse Practitioner

## 2019-05-24 ENCOUNTER — Other Ambulatory Visit: Payer: Self-pay

## 2019-05-24 ENCOUNTER — Other Ambulatory Visit (HOSPITAL_COMMUNITY)
Admission: RE | Admit: 2019-05-24 | Discharge: 2019-05-24 | Disposition: A | Discharge status: Home / Self Care | Payer: Medicaid Other | Source: Ambulatory Visit | Attending: Nurse Practitioner | Admitting: Nurse Practitioner

## 2019-05-24 ENCOUNTER — Ambulatory Visit (INDEPENDENT_AMBULATORY_CARE_PROVIDER_SITE_OTHER): Payer: Medicaid Other | Admitting: Nurse Practitioner

## 2019-05-24 VITALS — BP 138/91 | HR 73 | Ht 62.0 in | Wt 135.8 lb

## 2019-05-24 DIAGNOSIS — I10 Essential (primary) hypertension: Secondary | ICD-10-CM

## 2019-05-24 DIAGNOSIS — Z01419 Encounter for gynecological examination (general) (routine) without abnormal findings: Secondary | ICD-10-CM

## 2019-05-24 DIAGNOSIS — B20 Human immunodeficiency virus [HIV] disease: Secondary | ICD-10-CM

## 2019-05-24 DIAGNOSIS — R87610 Atypical squamous cells of undetermined significance on cytologic smear of cervix (ASC-US): Secondary | ICD-10-CM

## 2019-05-24 DIAGNOSIS — Z0000 Encounter for general adult medical examination without abnormal findings: Secondary | ICD-10-CM

## 2019-05-24 DIAGNOSIS — R87810 Cervical high risk human papillomavirus (HPV) DNA test positive: Secondary | ICD-10-CM

## 2019-05-24 DIAGNOSIS — Z30011 Encounter for initial prescription of contraceptive pills: Secondary | ICD-10-CM

## 2019-05-24 MED ORDER — NORGESTIMATE-ETH ESTRADIOL 0.25-35 MG-MCG PO TABS: 1.0000 | ORAL_TABLET | Freq: Every day | ORAL | 2 refills | Status: DC

## 2019-05-24 MED ORDER — ENALAPRIL MALEATE 10 MG PO TABS: 10.0000 mg | ORAL_TABLET | Freq: Every day | ORAL | 2 refills | Status: DC

## 2019-05-24 NOTE — Progress Notes (Signed)
GYNECOLOGY ANNUAL PREVENTATIVE CARE ENCOUNTER NOTE  Subjective:   Kimberly Bishop is a 29 y.o. [phone] female here for a routine annual gynecologic exam.  Current complaints: Wants to start pills.  Has never had hypertension until the end of her pregnancy and thought she would not need hypertensive medication anymore so she stopped it.  Sees Internal Medicine for her chronic disease management.  Has had some difficulty getting medications as she thinks does not have insurance but does have a Medicaid card.  Wants to use birth control pills for Contraception - has used them before and really wants to use them.    Denies abnormal vaginal bleeding, discharge, pelvic pain, problems with intercourse or other gynecologic concerns.    Gynecologic History Patient's last menstrual period was 05/09/2019 (approximate). Contraception: OCP (estrogen/progesterone) Last Pap: 10-18-18. Results were: ASCUS  Obstetric History OB History  Gravida Para Term Preterm AB Living  2 1 0 1 1 1   SAB TAB Ectopic Multiple Live Births  1 0 0 0 1    # Outcome Date GA Lbr Len/2nd Weight Sex Delivery Anes PTL Lv  2 Preterm 03/29/19 [redacted]w[redacted]d  4 lb 11.2 oz (2.132 kg) F CS-LTranv Spinal  LIV  1 SAB 01/02/13 [redacted]w[redacted]d           Past Medical History:  Diagnosis Date  . AIDS (acquired immunodeficiency syndrome), CD4 <=200 ([place])   . History of kidney stones   . HIV (human immunodeficiency virus infection) ([place])   . Immune deficiency disorder ([place])   . Infection    UTI  . Kidney stones     Past Surgical History:  Procedure Laterality Date  . APPENDECTOMY  2011  . CESAREAN SECTION N/A 03/29/2019   Procedure: CESAREAN SECTION;  Surgeon: Jonnie Kind, MD;  Location: [facility] LD ORS;  Service: Obstetrics;  Laterality: N/A;  . CYSTOSCOPY W/ URETERAL STENT PLACEMENT Right 11/03/2016   Procedure: CYSTOSCOPY WITH RETROGRADE PYELOGRAM/URETERAL STENT PLACEMENT;  Surgeon: Kathie Rhodes, MD;  Location: [facility] ORS;  Service: Urology;   Laterality: Right;  . CYSTOSCOPY/URETEROSCOPY/HOLMIUM LASER/STENT PLACEMENT Right 01/16/2017   Procedure: CYSTOSCOPY/URETEROSCOPY/ STONE EXTRACTION/STENT removal;  Surgeon: Kathie Rhodes, MD;  Location: [facility] ORS;  Service: Urology;  Laterality: Right;  . IR DIL URETER RIGHT  12/05/2016  . IR NEPHROSTOMY PLACEMENT RIGHT  11/07/2016  . IR URETERAL STENT PLACEMENT EXISTING ACCESS RIGHT  12/05/2016  . NEPHROLITHOTOMY Right 12/05/2016   Procedure: NEPHROLITHOTOMY PERCUTANEOUS;  Surgeon: Kathie Rhodes, MD;  Location: [facility] ORS;  Service: Urology;  Laterality: Right;    Current Outpatient Medications on File Prior to Visit  Medication Sig Dispense Refill  . bictegravir-emtricitabine-tenofovir AF (BIKTARVY) 50-200-25 MG TABS tablet Take 1 tablet by mouth daily. Separate from vitamins by at least 4 hours. 30 tablet 3  . ibuprofen (ADVIL) 800 MG tablet Take 1 tablet (800 mg total) by mouth every 8 (eight) hours. 30 tablet 0  . Blood Pressure Monitoring DEVI 1 Device by Does not apply route once a week. ICD 10: O09.90 1 Device 0   No current facility-administered medications on file prior to visit.    No Known Allergies  Social History   Socioeconomic History  . Marital status: Single    Spouse name: Not on file  . Number of children: Not on file  . Years of education: Not on file  . Highest education level: Not on file  Occupational History    Comment: unemployed  Tobacco Use  . Smoking status: Never Smoker  .  Smokeless tobacco: Never Used  Substance and Sexual Activity  . Alcohol use: Not Currently    Comment: occasional - last use 08/2018  . Drug use: Not Currently    Types: Marijuana    Comment: last use 08/24/18  . Sexual activity: Yes    Partners: Male    Birth control/protection: None  Other Topics Concern  . Not on file  Social History Narrative  . Not on file   Social Determinants of Health   Financial Resource Strain:   . Difficulty of Paying Living Expenses:   Food Insecurity:  No Food Insecurity  . Worried About Charity fundraiser in the Last Year: Never true  . Ran Out of Food in the Last Year: Never true  Transportation Needs: No Transportation Needs  . Lack of Transportation (Medical): No  . Lack of Transportation (Non-Medical): No  Physical Activity:   . Days of Exercise per Week:   . Minutes of Exercise per Session:   Stress:   . Feeling of Stress :   Social Connections:   . Frequency of Communication with Friends and Family:   . Frequency of Social Gatherings with Friends and Family:   . Attends Religious Services:   . Active Member of Clubs or Organizations:   . Attends Archivist Meetings:   Marland Kitchen Marital Status:   Intimate Partner Violence: Not At Risk  . Fear of Current or Ex-Partner: No  . Emotionally Abused: No  . Physically Abused: No  . Sexually Abused: No    Family History  Adopted: Yes  Problem Relation Age of Onset  . Hypertension Mother   . Autism Brother     The following portions of the patient's history were reviewed and updated as appropriate: allergies, current medications, past family history, past medical history, past social history, past surgical history and problem list.  Review of Systems Pertinent items noted in HPI and remainder of comprehensive ROS otherwise negative.   Objective:  BP (!) 138/91   Pulse 73   Ht 5\' 2"  (1.575 m)   Wt 135 lb 12.8 oz (61.6 kg)   LMP 05/09/2019 (Approximate)   BMI 24.84 kg/m  CONSTITUTIONAL: Well-developed, well-nourished female in no acute distress.  HENT:  Normocephalic, atraumatic, External right and left ear normal.  EYES: Conjunctivae and EOM are normal. Pupils are equal, round.  No scleral icterus.  NECK: Normal range of motion, supple, no masses.  Normal thyroid.  SKIN: Skin is warm and dry. No rash noted. Not diaphoretic. No erythema. No pallor. NEUROLOGIC: Alert and oriented to person, place, and time. Normal reflexes, muscle tone coordination. No cranial nerve  deficit noted. PSYCHIATRIC: Normal mood and affect. Normal behavior. Normal judgment and thought content. CARDIOVASCULAR: Normal heart rate noted, regular rhythm RESPIRATORY: Clear to auscultation bilaterally. Effort and breath sounds normal, no problems with respiration noted. BREASTS: Symmetric in size. No masses, skin changes, nipple drainage, or lymphadenopathy. Previous pierced nipples with tiny external scars noted.  Nipples flat. ABDOMEN: Soft, no distention noted.  No tenderness, rebound or guarding.  PELVIC: Normal appearing external genitalia; normal appearing vaginal mucosa and cervix.  No abnormal discharge noted.  Pap smear obtained.  Normal uterine size, no other palpable masses, no uterine or adnexal tenderness. MUSCULOSKELETAL: Normal range of motion. No tenderness.  No cyanosis, clubbing, or edema.    Assessment and Plan:  1. Encounter for annual routine gynecological examination Advised drinking 64 oz of water daily Advised to continue follow up as scheduled with  internal medicine  2. ASCUS with positive high risk HPV cervical Pap done today.  With chronic disease of HIV, Pap needs to be annually.  3. HIV infection, unspecified symptom status ([place]) Recently started Biktarvy  4. Hypertension, unspecified type BP elevated today diastolically.  Advised clietn to begin BP meds again and a refill of Vasotec to her pharmacy.  5. Initiation of OCP (BCP) Reviewed increased risk of stroke with pills and greater risk if BP is elevated.  Advised to continue blood pressure medication to begin birth control pills.  Will see again in 6 weeks for BP check.  Will follow up results of pap smear and manage accordingly. Routine preventative health maintenance measures emphasized. Please refer to After Visit Summary for other counseling recommendations.    Earlie Server, RN, MSN, NP-BC Nurse Practitioner, [place] for [facility]

## 2019-05-24 NOTE — Progress Notes (Signed)
Pt states wants to do [employer].

## 2019-05-24 NOTE — Progress Notes (Signed)
Note completed.  This was duplicate.  Earlie Server, RN, MSN, NP-BC Nurse Practitioner, [facility] for [employer], [place] [facility] 05/24/2019 4:31 PM

## 2019-05-29 ENCOUNTER — Other Ambulatory Visit: Payer: Self-pay | Admitting: *Deleted

## 2019-05-29 LAB — CYTOLOGY - PAP
Adequacy: ABSENT
Comment: NEGATIVE
Diagnosis: UNDETERMINED — AB
High risk HPV: POSITIVE — AB

## 2019-05-29 MED ORDER — IBUPROFEN 800 MG PO TABS: 800.0000 mg | ORAL_TABLET | Freq: Three times a day (TID) | ORAL | 0 refills | Status: DC

## 2019-05-29 NOTE — Progress Notes (Signed)
Pt sent MyChart message requesting refill of ibuprofen d/t occasional cramps and backache following C/S on 03/29/19. Refill approved by Dr. Ilda Basset and e-prescribed. Pt notified via MyChart message.

## 2019-06-04 ENCOUNTER — Telehealth: Payer: Self-pay | Admitting: Advanced Practice Midwife

## 2019-06-04 NOTE — Telephone Encounter (Signed)
Opened in error

## 2019-06-25 NOTE — Addendum Note (Signed)
Encounter addended by: Pricilla Larsson, [facility] on: 06/25/2019 11:17 AM  Actions taken: Order list changed

## 2019-06-27 ENCOUNTER — Ambulatory Visit: Payer: Medicaid Other | Admitting: Infectious Diseases

## 2019-07-22 ENCOUNTER — Ambulatory Visit: Payer: Medicaid Other | Admitting: Obstetrics and Gynecology

## 2019-07-24 ENCOUNTER — Ambulatory Visit: Payer: Medicaid Other

## 2019-07-24 ENCOUNTER — Telehealth: Payer: Self-pay | Admitting: Family Medicine

## 2019-07-24 NOTE — Telephone Encounter (Signed)
Attempted to reach patient about rescheduling her missed appointment. Left a voicemail message for her to call us back.

## 2019-08-21 ENCOUNTER — Other Ambulatory Visit: Payer: Self-pay

## 2019-08-21 ENCOUNTER — Ambulatory Visit (INDEPENDENT_AMBULATORY_CARE_PROVIDER_SITE_OTHER): Payer: Medicaid Other | Admitting: Infectious Diseases

## 2019-08-21 ENCOUNTER — Encounter: Payer: Self-pay | Admitting: Infectious Diseases

## 2019-08-21 VITALS — BP 131/82 | HR 92 | Temp 99.5°F | Ht 63.0 in | Wt 136.0 lb

## 2019-08-21 DIAGNOSIS — R87810 Cervical high risk human papillomavirus (HPV) DNA test positive: Secondary | ICD-10-CM

## 2019-08-21 DIAGNOSIS — Z3201 Encounter for pregnancy test, result positive: Secondary | ICD-10-CM

## 2019-08-21 DIAGNOSIS — Z21 Asymptomatic human immunodeficiency virus [HIV] infection status: Secondary | ICD-10-CM

## 2019-08-21 DIAGNOSIS — R87610 Atypical squamous cells of undetermined significance on cytologic smear of cervix (ASC-US): Secondary | ICD-10-CM | POA: Diagnosis not present

## 2019-08-21 MED ORDER — TIVICAY 50 MG PO TABS: 50.0000 mg | ORAL_TABLET | Freq: Every day | ORAL | 9 refills | Status: DC

## 2019-08-21 MED ORDER — EMTRICITABINE-TENOFOVIR DF 200-300 MG PO TABS: 1.0000 | ORAL_TABLET | Freq: Every day | ORAL | 9 refills | Status: DC

## 2019-08-21 NOTE — Assessment & Plan Note (Addendum)
Not clear she has been on her [place] regularly since delivery. She overall is well appearing today and no signs of opportunistic infection. Last known CD4 20 during 3rd trimester - will repeat today as well as viral load.  We discussed options regarding changing her ARVs back to safer option for pregnancy and what she was able to tolerate before and will do Tivicay and Truvada once a day. Advised to continue folic acid supplementation of at least 400 mcg once daily. February 2020 guidelines support the use of DTG in pregnancy with folic acid supplementation.  Discussed with her that her partner needs to get in for testing and PrEP if he is negative - information provided today.  She is greatly interested in [place] injections for treatment after her current pregnancy. Discussed she will need a more reliable form of birthcontrol to do this (IUD, Nexplanon preferred).  Return in about 6 weeks (around 10/02/2019).

## 2019-08-21 NOTE — Assessment & Plan Note (Addendum)
Will check beta hcg quant today and refer back to GYN for ultrasound to date. EDD Feb 3rd 2022, dating her 5w 6d today.  She can take benadryl prn for itching but recommended least dose / frequency necessary.  Stop enalapril and follow up with GYN to see if ASA prophylaxis with h/o pre-eclampsia and/or alternative blood pressure agent is needed.  Advised to stop ibuprofen and stick to tylenol.  Folic acid. Change ARVs as above.

## 2019-08-21 NOTE — Patient Instructions (Addendum)
Nice to see you today!  Please STOP your Biktarvy and your Enalapril (blood pressure medication)   START TIVICAY (yellow circular pill) AND TRUVADA everyday. Try your best to get these doses in and get the viral load down.  Please continue taking your prenatal vitamin once a day - try to separate it from your Tivicay and Truvada by 6 hours - so maybe take it at night before you go to bed.   OK to take zyrtec and benadryl for itching - less is best anytime in the first trimester.   Only Tylenol for now to treat aches and pains or headaches - ibuprofen should not be used in first trimester.   Please have your partner consider PrEP (HIV prevention). We can take good care of him and get him regular testing and free medication to significantly lower the risk. The best way to lower the risk is to take your medication   Please return in 6 weeks for blood work and a virtual visit (or inperson if you prefer) 2 weeks later.

## 2019-08-21 NOTE — Assessment & Plan Note (Signed)
Following with GYN - persistent ASCUS with HRHPV in March 2021. Needs repeat colposcopy if she has not had this since.

## 2019-08-21 NOTE — Progress Notes (Signed)
Name: Kimberly Bishop  DOB: 01/23/1991 MRN: [id number] PCP: Patient, No Pcp Per  OB/GYN: [name], MD   Patient Active Problem List   Diagnosis Date Noted  . Positive pregnancy test 08/21/2019  . History of cesarean delivery 04/29/2019  . History of preterm delivery 04/29/2019  . History of pre-eclampsia 03/29/2019  . HIV (human immunodeficiency virus infection) ([place]) 03/29/2019  . History of IUGR (intrauterine growth retardation) and stillbirth, currently pregnant 03/26/2019  . History of oligohydramnios 03/26/2019  . Rubella nonimmune status, delivered, current hospitalization 02/15/2019  . Hematuria, gross 01/08/2019  . Diastasis recti 12/06/2018  . CIN I (cervical intraepithelial neoplasia I) 11/28/2018  . ASCUS with positive high risk HPV cervical 11/26/2018  . Encounter for long-term (current) use of high-risk medication 03/28/2017  . Molluscum contagiosum 03/22/2016     Brief Narrative:  Kimberly Bishop is a 29 y.o. female with HIV disease. CD4 nadir 90, likely less unrecorded VL: she has never been undetectable HIV Risk: Perinatal History of OIs: Intake Labs 2013: Hep B sAg (-), sAb (-), cAb (-); Hep A (-), Hep C (-) Quantiferon () HLA B*5701 (-) G6PD: ()   Previous Regimens: . Tivicay + [place]sey . Biktarvy . Tivicay + Truvada (pregnancy 10-2018)  Genotypes: . 05/2018: wildtype virus  . 02/18/19: no significant mutations including integrase     Subjective:  CC: HIV follow up care.  Newly discovered she is pregnant. LMP 07/11/19.     HPI: [name] called for an appointment to change her medication after learning of recently positive urine pregnancy test in the setting missed menses. She has been feeling well and no secondary signs/symptoms of pregnancy at this time. She has one regular female partner with rare condom use. He is aware she is HIV+ but has not been accepting of testing regularly. Unclear if he has ever had a HIV test. He is not taking  PrEP.   Her nearly 3 month old daughter was delivered via c/section March 29, 2019 and growing well. Last known viral load prior to deliver in January was only 163. She has not been diligent about taking her Biktarvy the last few months. She states she has a very hard time remembering the medication. She thinks morning time is the best time to take it for her schedule. She reports no concerns today suggestive of associated opportunistic infection or advancing HIV disease such as fevers, night sweats, weight loss, anorexia, cough, SOB, nausea, vomiting, diarrhea, headache, sensory changes, lymphadenopathy or oral thrush.  The main physical complaint she has today is all over head to toe itching. No rashes are present. Palms/feet are spared.   She has started a prenatal multivitamin this week after positive UPT.   She had a pap smear in July with OB/GYN team - read as ASCUS with +HPV.  Underwent colposcopy 11/27/2018 revealing CIN1.    Review of Systems  All other systems reviewed and are negative.   Past Medical History:  Diagnosis Date  . AIDS (acquired immunodeficiency syndrome), CD4 <=200 ([place])   . History of kidney stones   . HIV (human immunodeficiency virus infection) ([place])   . Immune deficiency disorder ([place])   . Infection    UTI  . Kidney stones     Outpatient Medications Prior to Visit  Medication Sig Dispense Refill  . Blood Pressure Monitoring DEVI 1 Device by Does not apply route once a week. ICD 10: O09.90 1 Device 0  . bictegravir-emtricitabine-tenofovir AF (BIKTARVY) 50-200-25 MG TABS tablet  Take 1 tablet by mouth daily. Separate from vitamins by at least 4 hours. 30 tablet 3  . enalapril (VASOTEC) 10 MG tablet Take 1 tablet (10 mg total) by mouth daily. 30 tablet 2  . ibuprofen (ADVIL) 800 MG tablet Take 1 tablet (800 mg total) by mouth every 8 (eight) hours. 30 tablet 0  . norgestimate-ethinyl estradiol (ORTHO-CYCLEN) 0.25-35 MG-MCG tablet Take 1 tablet by mouth daily.  1 Package 2   No facility-administered medications prior to visit.     No Known Allergies  Social History   Tobacco Use  . Smoking status: Never Smoker  . Smokeless tobacco: Never Used  Substance Use Topics  . Alcohol use: Not Currently    Comment: occasional - last use 08/2018  . Drug use: Not Currently    Types: Marijuana    Comment: last use 08/24/18    Social History   Substance and Sexual Activity  Sexual Activity Yes  . Partners: Male  . Birth control/protection: None     Objective:   Vitals:   08/21/19 1351  BP: 131/82  Pulse: 92  Temp: 99.5 F (37.5 C)  Weight: 136 lb (61.7 kg)  Height: _0  (1.6 m)   Body mass index is 24.09 kg/m.  Physical Exam Vitals reviewed.  Constitutional:      Appearance: Normal appearance. She is not ill-appearing.  HENT:     Mouth/Throat:     Mouth: Mucous membranes are moist.     Pharynx: Oropharynx is clear.  Eyes:     General: No scleral icterus. Pulmonary:     Effort: Pulmonary effort is normal.  Neurological:     Mental Status: She is oriented to person, place, and time.  Psychiatric:        Mood and Affect: Mood normal.        Thought Content: Thought content normal.     Lab Results Lab Results  Component Value Date   WBC 15.3 (H) 03/30/2019   HGB 8.6 (L) 03/30/2019   HCT 27.2 (L) 03/30/2019   MCV 82.4 03/30/2019   PLT 336 03/30/2019    Lab Results  Component Value Date   CREATININE 0.92 03/29/2019   BUN 12 03/29/2019   NA 134 (L) 03/29/2019   K 4.6 03/29/2019   CL 106 03/29/2019   CO2 19 (L) 03/29/2019    Lab Results  Component Value Date   ALT 20 03/29/2019   AST 40 03/29/2019   ALKPHOS 183 (H) 03/29/2019   BILITOT 0.5 03/29/2019    Lab Results  Component Value Date   CHOL 177 03/28/2017   HDL 44 (L) 03/28/2017   LDLCALC 119 (H) 03/28/2017   TRIG 58 03/28/2017   CHOLHDL 4.0 03/28/2017   HIV 1 RNA Quant (copies/mL)  Date Value  03/26/2019 136 (H)  02/18/2019 1,480 (H)    02/04/2019 4,750 (H)   CD4 T Cell Abs (/uL)  Date Value  02/04/2019 68 (L)  10/03/2018 94 (L)  08/27/2018 90 (L)     Assessment & Plan:   Problem List Items Addressed This Visit      Unprioritized   HIV (human immunodeficiency virus infection) ([place]) - Primary (Chronic)    Not clear she has been on her Ada regularly since delivery. She overall is well appearing today and no signs of opportunistic infection. Last known CD4 30 during 3rd trimester - will repeat today as well as viral load.  We discussed options regarding changing her ARVs back to safer option for  pregnancy and what she was able to tolerate before and will do Tivicay and Truvada once a day. Advised to continue folic acid supplementation of at least 400 mcg once daily. February 2020 guidelines support the use of DTG in pregnancy with folic acid supplementation.  Discussed with her that her partner needs to get in for testing and PrEP if he is negative - information provided today.  She is greatly interested in [facility] injections for treatment after her current pregnancy. Discussed she will need a more reliable form of birthcontrol to do this (IUD, Nexplanon preferred).  Return in about 6 weeks (around 10/02/2019).        Relevant Medications   dolutegravir (TIVICAY) 50 MG tablet   emtricitabine-tenofovir (TRUVADA) 200-300 MG tablet   Other Relevant Orders   HIV-1 RNA quant-no reflex-bld   T-helper cell (CD4)- ([facility] clinic only)   hCG, quantitative, pregnancy   Hepatic function panel   ASCUS with positive high risk HPV cervical    Following with GYN - persistent ASCUS with HRHPV in March 2021. Needs repeat colposcopy if she has not had this since.       Positive pregnancy test    Will check beta hcg quant today and refer back to GYN for ultrasound to date. EDD Feb 3rd 2022, dating her 5w 6d today.  She can take benadryl prn for itching but recommended least dose / frequency necessary.  Stop enalapril and follow  up with GYN to see if ASA prophylaxis with h/o pre-eclampsia and/or alternative blood pressure agent is needed.  Advised to stop ibuprofen and stick to tylenol.  Folic acid. Change ARVs as above.         Janene Madeira, MSN, NP-C [facility] [place] Pager: [phone] Office: [phone]  08/21/19  8:35 PM

## 2019-08-22 LAB — HEPATIC FUNCTION PANEL
AG Ratio: 1.3 (calc) (ref 1.0–2.5)
ALT: 29 U/L (ref 6–29)
AST: 21 U/L (ref 10–30)
Albumin: 4.5 g/dL (ref 3.6–5.1)
Alkaline phosphatase (APISO): 108 U/L (ref 31–125)
Bilirubin, Direct: 0.1 mg/dL (ref 0.0–0.2)
Globulin: 3.4 g/dL (calc) (ref 1.9–3.7)
Indirect Bilirubin: 0.3 mg/dL (calc) (ref 0.2–1.2)
Total Bilirubin: 0.4 mg/dL (ref 0.2–1.2)
Total Protein: 7.9 g/dL (ref 6.1–8.1)

## 2019-08-22 LAB — T-HELPER CELL (CD4) - (RCID CLINIC ONLY)
CD4 % Helper T Cell: 9 % — ABNORMAL LOW (ref 33–65)
CD4 T Cell Abs: 67 /uL — ABNORMAL LOW (ref 400–1790)

## 2019-08-22 LAB — HCG, QUANTITATIVE, PREGNANCY: HCG, Total, QN: 9386 m[IU]/mL

## 2019-08-26 ENCOUNTER — Encounter: Payer: Self-pay | Admitting: *Deleted

## 2019-08-26 ENCOUNTER — Other Ambulatory Visit: Payer: Self-pay

## 2019-08-26 ENCOUNTER — Ambulatory Visit (INDEPENDENT_AMBULATORY_CARE_PROVIDER_SITE_OTHER): Payer: Medicaid Other | Admitting: *Deleted

## 2019-08-26 VITALS — BP 122/75 | HR 68 | Temp 98.6°F | Ht 62.0 in | Wt 139.0 lb

## 2019-08-26 DIAGNOSIS — Z3201 Encounter for pregnancy test, result positive: Secondary | ICD-10-CM | POA: Diagnosis not present

## 2019-08-26 DIAGNOSIS — Z3200 Encounter for pregnancy test, result unknown: Secondary | ICD-10-CM

## 2019-08-26 LAB — POCT PREGNANCY, URINE: Preg Test, Ur: POSITIVE — AB

## 2019-08-26 NOTE — Progress Notes (Signed)
Pt was informed of +UPT today. She reports LMP 07/11/19 - approximate within a few days. She denies bleeding or cramping. Pt will schedule prenatal care in this office. Pt was advised to send correspondence of any future questions or concerns via MyChart. She voiced understanding of all information and instructions given.

## 2019-08-27 ENCOUNTER — Ambulatory Visit: Payer: Medicaid Other | Admitting: Obstetrics and Gynecology

## 2019-08-28 ENCOUNTER — Other Ambulatory Visit: Payer: Self-pay

## 2019-08-28 MED ORDER — TIVICAY 50 MG PO TABS: 50.0000 mg | ORAL_TABLET | Freq: Every day | ORAL | 9 refills | Status: DC

## 2019-08-28 MED ORDER — EMTRICITABINE-TENOFOVIR DF 200-300 MG PO TABS: 1.0000 | ORAL_TABLET | Freq: Every day | ORAL | 9 refills | Status: DC

## 2019-09-11 ENCOUNTER — Telehealth (INDEPENDENT_AMBULATORY_CARE_PROVIDER_SITE_OTHER): Payer: Medicaid Other | Admitting: *Deleted

## 2019-09-11 ENCOUNTER — Other Ambulatory Visit: Payer: Self-pay

## 2019-09-11 DIAGNOSIS — R87810 Cervical high risk human papillomavirus (HPV) DNA test positive: Secondary | ICD-10-CM

## 2019-09-11 DIAGNOSIS — O09219 Supervision of pregnancy with history of pre-term labor, unspecified trimester: Secondary | ICD-10-CM

## 2019-09-11 DIAGNOSIS — O09899 Supervision of other high risk pregnancies, unspecified trimester: Secondary | ICD-10-CM | POA: Insufficient documentation

## 2019-09-11 DIAGNOSIS — Z8751 Personal history of pre-term labor: Secondary | ICD-10-CM

## 2019-09-11 DIAGNOSIS — O0990 Supervision of high risk pregnancy, unspecified, unspecified trimester: Secondary | ICD-10-CM

## 2019-09-11 DIAGNOSIS — Z98891 History of uterine scar from previous surgery: Secondary | ICD-10-CM

## 2019-09-11 DIAGNOSIS — R87610 Atypical squamous cells of undetermined significance on cytologic smear of cervix (ASC-US): Secondary | ICD-10-CM

## 2019-09-11 DIAGNOSIS — Z8759 Personal history of other complications of pregnancy, childbirth and the puerperium: Secondary | ICD-10-CM

## 2019-09-11 DIAGNOSIS — O98719 Human immunodeficiency virus [HIV] disease complicating pregnancy, unspecified trimester: Secondary | ICD-10-CM

## 2019-09-11 DIAGNOSIS — B20 Human immunodeficiency virus [HIV] disease: Secondary | ICD-10-CM | POA: Insufficient documentation

## 2019-09-11 NOTE — Progress Notes (Signed)
I connected with  Hyndman on 09/11/19 at 10:15 AM EDT by virtually and verified that I am speaking with the correct person using two identifiers.   I discussed the limitations, risks, security and privacy concerns of performing an evaluation and management service by virtually and the availability of in person appointments. I also discussed with the patient that there may be a patient responsible charge related to this service. The patient expressed understanding and agreed to proceed.  I explained I am completing her New OB Intake today. We discussed Her EDD and that it is based on  sure LMP of within a few days . I reviewed her allergies, meds, OB History, Medical /Surgical history, and appropriate screenings. She is [id number] with HX HIV and PTD. She recently had a baby in January of this year.   I explained I will send her the [employer] app again and app was sent to her while on phone.  She will complete after we finish the call. I asked her to let us know if she did not get it or has  Problems.  I explained we will again have her take her blood pressure weekly during her pregnancy. She still has her  blood pressure cuff from last pregnancy. We will again have enter her weekly blood pressures  into the app.  I explained she will have some visits in office and some virtually. She already has Community education officer.  I reviewed her new ob  appointment date/ time with her , our location and to wear mask, no visitors. I explained her appointment will change because currently it is listed with an APP and our protocol is that if you are high risk ; it needs to be with an MD. I explained registar will change to MD and notify her of new appointment.   I explained she will have a pelvic exam, ob bloodwork, hemoglobin a1C, cbg ,and  genetic testing if desired,- she does want a panorama( but does not want to know the sex). She also had an abnormal pap in March of this year and missed her colposcopy appointment. I  explained the doctor will decide if she needs to have colposcopy reschedule, or delayed or another pap.   I explained I will schedule an [place] at 19 weeks and she will be able to see it in [place] . She voices understanding.  Christmas Faraci,RN 09/11/2019  10:21 AM

## 2019-09-11 NOTE — Patient Instructions (Signed)

## 2019-09-18 ENCOUNTER — Encounter: Payer: Medicaid Other | Admitting: Medical

## 2019-09-19 ENCOUNTER — Encounter: Payer: Medicaid Other | Admitting: Obstetrics and Gynecology

## 2019-09-23 ENCOUNTER — Other Ambulatory Visit: Payer: Self-pay

## 2019-09-23 ENCOUNTER — Inpatient Hospital Stay (HOSPITAL_COMMUNITY): Payer: Medicaid Other

## 2019-09-23 ENCOUNTER — Encounter (HOSPITAL_COMMUNITY): Payer: Self-pay | Admitting: Obstetrics and Gynecology

## 2019-09-23 ENCOUNTER — Inpatient Hospital Stay (HOSPITAL_COMMUNITY)
Admission: AD | Admit: 2019-09-23 | Discharge: 2019-09-23 | Disposition: A | Discharge status: Home / Self Care | Payer: Medicaid Other | Attending: Obstetrics and Gynecology | Admitting: Obstetrics and Gynecology

## 2019-09-23 DIAGNOSIS — Z87440 Personal history of urinary (tract) infections: Secondary | ICD-10-CM | POA: Insufficient documentation

## 2019-09-23 DIAGNOSIS — Z3A10 10 weeks gestation of pregnancy: Secondary | ICD-10-CM | POA: Insufficient documentation

## 2019-09-23 DIAGNOSIS — O209 Hemorrhage in early pregnancy, unspecified: Secondary | ICD-10-CM | POA: Diagnosis not present

## 2019-09-23 DIAGNOSIS — Z87442 Personal history of urinary calculi: Secondary | ICD-10-CM | POA: Insufficient documentation

## 2019-09-23 DIAGNOSIS — O10911 Unspecified pre-existing hypertension complicating pregnancy, first trimester: Secondary | ICD-10-CM | POA: Diagnosis not present

## 2019-09-23 DIAGNOSIS — O039 Complete or unspecified spontaneous abortion without complication: Secondary | ICD-10-CM

## 2019-09-23 DIAGNOSIS — Z8249 Family history of ischemic heart disease and other diseases of the circulatory system: Secondary | ICD-10-CM | POA: Insufficient documentation

## 2019-09-23 DIAGNOSIS — O98711 Human immunodeficiency virus [HIV] disease complicating pregnancy, first trimester: Secondary | ICD-10-CM | POA: Insufficient documentation

## 2019-09-23 DIAGNOSIS — O4690 Antepartum hemorrhage, unspecified, unspecified trimester: Secondary | ICD-10-CM

## 2019-09-23 LAB — HCG, QUANTITATIVE, PREGNANCY: hCG, Beta Chain, Quant, S: 15087 m[IU]/mL — ABNORMAL HIGH (ref <5)

## 2019-09-23 LAB — CBC
HCT: 33.5 % — ABNORMAL LOW (ref 36.0–46.0)
Hemoglobin: 10.7 g/dL — ABNORMAL LOW (ref 12.0–15.0)
MCH: 26 pg (ref 26.0–34.0)
MCHC: 31.9 g/dL (ref 30.0–36.0)
MCV: 81.5 fL (ref 80.0–100.0)
Platelets: 292 10*3/uL (ref 150–400)
RBC: 4.11 MIL/uL (ref 3.87–5.11)
RDW: 15.9 % — ABNORMAL HIGH (ref 11.5–15.5)
WBC: 7.2 10*3/uL (ref 4.0–10.5)
nRBC: 0 % (ref 0.0–0.2)

## 2019-09-23 MED ORDER — MISOPROSTOL 200 MCG PO TABS: 1000.0000 ug | ORAL_TABLET | Freq: Once | ORAL | 0 refills | Status: DC

## 2019-09-23 MED ORDER — OXYCODONE HCL 5 MG PO TABS: 5.0000 mg | ORAL_TABLET | Freq: Four times a day (QID) | ORAL | 0 refills | Status: AC | PRN

## 2019-09-23 NOTE — MAU Provider Note (Addendum)
History     CSN: [id number]  Arrival date and time: 09/23/19 0546   First Provider Initiated Contact with Patient 09/23/19 [phone]      Chief Complaint  Patient presents with  . Vaginal Bleeding   [name] is a 29 y.o. year old [id number] female at [redacted]w[redacted]d weeks gestation by LMP who was sent to MAU from [facility] reporting heavy vaginal bleeding that started at 5 AM this morning but got worse when she arrived at [facility] registration desk.  She reports she started having lower abdominal cramping at 5 AM as well; pain rated 8 out of 10.  She has not taken any medication for the pain.  She is scheduled to start prenatal care at the [facility] on September 25, 2019.   OB History    Gravida  3   Para  1   Term  0   Preterm  1   AB  1   Living  1     SAB  1   TAB  0   Ectopic  0   Multiple  0   Live Births  1           Past Medical History:  Diagnosis Date  . AIDS (acquired immunodeficiency syndrome), CD4 <=200 ([place])   . History of kidney stones   . HIV (human immunodeficiency virus infection) ([place])   . Hypertension   . Immune deficiency disorder ([place])   . Infection    UTI  . Kidney stones     Past Surgical History:  Procedure Laterality Date  . APPENDECTOMY  2011  . CESAREAN SECTION N/A 03/29/2019   Procedure: CESAREAN SECTION;  Surgeon: Jonnie Kind, MD;  Location: [facility] LD ORS;  Service: Obstetrics;  Laterality: N/A;  . CYSTOSCOPY W/ URETERAL STENT PLACEMENT Right 11/03/2016   Procedure: CYSTOSCOPY WITH RETROGRADE PYELOGRAM/URETERAL STENT PLACEMENT;  Surgeon: Kathie Rhodes, MD;  Location: [facility] ORS;  Service: Urology;  Laterality: Right;  . CYSTOSCOPY/URETEROSCOPY/HOLMIUM LASER/STENT PLACEMENT Right 01/16/2017   Procedure: CYSTOSCOPY/URETEROSCOPY/ STONE EXTRACTION/STENT removal;  Surgeon: Kathie Rhodes, MD;  Location: [facility] ORS;  Service: Urology;  Laterality: Right;  . IR DIL URETER RIGHT  12/05/2016  . IR NEPHROSTOMY PLACEMENT RIGHT  11/07/2016  . IR  URETERAL STENT PLACEMENT EXISTING ACCESS RIGHT  12/05/2016  . NEPHROLITHOTOMY Right 12/05/2016   Procedure: NEPHROLITHOTOMY PERCUTANEOUS;  Surgeon: Kathie Rhodes, MD;  Location: [facility] ORS;  Service: Urology;  Laterality: Right;    Family History  Adopted: Yes  Problem Relation Age of Onset  . Hypertension Mother   . Autism Brother     Social History   Tobacco Use  . Smoking status: Never Smoker  . Smokeless tobacco: Never Used  Vaping Use  . Vaping Use: Never used  Substance Use Topics  . Alcohol use: Not Currently    Comment: occasional -   . Drug use: Not Currently    Types: Marijuana    Comment: last use 08/24/18    Allergies: No Known Allergies  No medications prior to admission.    Review of Systems  Constitutional: Negative.   HENT: Negative.   Eyes: Negative.   Respiratory: Negative.   Cardiovascular: Negative.   Gastrointestinal: Negative.   Endocrine: Negative.   Genitourinary: Positive for pelvic pain (cramping since 0500) and vaginal bleeding (started at 0500).  Musculoskeletal: Negative.   Skin: Negative.   Allergic/Immunologic: Negative.   Neurological: Negative.   Hematological: Negative.   Psychiatric/Behavioral: Negative.    Physical Exam  Blood pressure 129/85, pulse 72, temperature 98.2 F (36.8 C), temperature source Oral, resp. rate 20, height 5\' 2"  (1.575 m), weight 63 kg, last menstrual period 07/11/2019, SpO2 100 %, not currently breastfeeding.  Physical Exam Vitals and nursing note reviewed. Exam conducted with a chaperone present.  Constitutional:      Appearance: Normal appearance. She is normal weight.  HENT:     Head: Normocephalic and atraumatic.  Cardiovascular:     Rate and Rhythm: Normal rate.     Pulses: Normal pulses.  Pulmonary:     Effort: Pulmonary effort is normal.  Abdominal:     General: Abdomen is flat.     Palpations: Abdomen is soft.  Genitourinary:    Comments: Uterus: non-tender, intact sac that appears to be  S=D at introitus removed with Ring forcep - specimen obtained for pathology. SE: unable to fully visualize cervix and os d/t bloo, clots and patient's intolerance of speculum; large amt of bright, red blood with medium sized clots in vaginal vault -- WP, GC/CT not done d/t cervix not visualized and amt of VB. Bimanual deferred  Musculoskeletal:        General: Normal range of motion.     Cervical back: Normal range of motion.  Skin:    General: Skin is warm and dry.  Neurological:     Mental Status: She is alert and oriented to person, place, and time.  Psychiatric:        Mood and Affect: Mood normal.        Behavior: Behavior normal.        Thought Content: Thought content normal.        Judgment: Judgment normal.     MAU Course  Procedures  MDM HCG CBC OB <14 wks U/S  Results for orders placed or performed during the hospital encounter of 09/23/19 (from the past 24 hour(s))  hCG, quantitative, pregnancy     Status: Abnormal   Collection Time: 09/23/19  7:12 AM  Result Value Ref Range   hCG, Beta Chain, Quant, S 15,087 (H) <5 mIU/mL  CBC     Status: Abnormal   Collection Time: 09/23/19  7:12 AM  Result Value Ref Range   WBC 7.2 4.0 - 10.5 K/uL   RBC 4.11 3.87 - 5.11 MIL/uL   Hemoglobin 10.7 (L) 12.0 - 15.0 g/dL   HCT 33.5 (L) 36 - 46 %   MCV 81.5 80.0 - 100.0 fL   MCH 26.0 26.0 - 34.0 pg   MCHC 31.9 30.0 - 36.0 g/dL   RDW 15.9 (H) 11.5 - 15.5 %   Platelets 292 150 - 400 K/uL   nRBC 0.0 0.0 - 0.2 %     US OB LESS THAN 14 WEEKS WITH OB TRANSVAGINAL  Result Date: 09/23/2019 CLINICAL DATA:  Vaginal bleeding and cramping EXAM: OBSTETRIC <14 WK [place] AND TRANSVAGINAL OB US TECHNIQUE: Both transabdominal and transvaginal ultrasound examinations were performed for complete evaluation of the gestation as well as the maternal uterus, adnexal regions, and pelvic cul-de-sac. Transvaginal technique was performed to assess early pregnancy. COMPARISON:  None. FINDINGS: Intrauterine  gestational sac: Not visualized Yolk sac:  Not visualized Embryo:  Not visualized Cardiac Activity: Not visualized Subchorionic hemorrhage:  None visualized. Maternal uterus/adnexae: Cervical os is closed. Endometrium is thickened measuring 23 mm in thickness. The endometrium is somewhat inhomogeneous. There is a cyst arising from the right ovary measuring 1.8 x 1.7 x 1.7 cm. No other extrauterine pelvic mass. No free pelvic fluid.  IMPRESSION: No intrauterine gestation seen. Endometrium is thickened and mildly inhomogeneous. Assuming positive beta HCG, differential considerations include intrauterine gestation too early to be seen by either transabdominal transvaginal technique; recent spontaneous abortion; ectopic gestation. This circumstance warrants close clinical and laboratory correlation. Timing of repeat ultrasound in large part will depend on beta HCG values going forward. Simple cyst right ovary which may represent a focal corpus luteum. No other extrauterine pelvic mass. No free pelvic fluid. Electronically Signed   By: Lowella Grip III M.D.   On: 09/23/2019 08:09    Report given to and care assumed by Jorje Guild, [place] @ 0800  Laury Deep, MSN, CNM 09/23/2019, 0800 AM   Ultrasound shows thickened endometrium at 23 mm & no IUP. Hcg is >15000. Likely POCs were removed by previous provider during pelvic exam - will send to pathology to confirm chorionic villi.   RH positive.   Bleeding has slowed down & patient is stable. Hemoglobin stable. Patient currently reports no pain.   Consulted with Dr. Rip Harbour. Likely miscarriage. Can d/c home with cytotec. If pathology report is negative for chorionic villi, will get an HCG next week.   Assessment and Plan   A:  1. Miscarriage   2. Vaginal bleeding in pregnancy   3. Vaginal bleeding affecting early pregnancy    P: Discharge home Rx cytotec Rx oxycodone #12 prn breakthrough pain Cancelled new ob appointment Msg to [facility] for  miscarriage appointment Pathology report pending Discussed reasons to return to MAU - bleeding & infection precautions reviewed  Jorje Guild, NP

## 2019-09-23 NOTE — MAU Note (Signed)
PT SAYS SHE STARTED AT [place].    SHE WENT TO [place] - SAYS WENT TO CLOSEST ER.    CRAMPING STARTED  AT 5A.  .  IN ROOM- MOD AMT RED BLOOD .  CRAMPS- NOW-8 - NO MEDS

## 2019-09-23 NOTE — Discharge Instructions (Signed)
Return to care   If you have heavier bleeding that soaks through more that 2 pads per hour for an hour or more  If you bleed so much that you feel like you might pass out or you do pass out  If you have significant abdominal pain that is not improved with Tylenol   If you develop a fever > 100.5    FACTS YOU SHOULD KNOW  WHAT IS AN EARLY PREGNANCY FAILURE? Once the egg is fertilized with the sperm and begins to develop, it attaches to the lining of the uterus. This early pregnancy tissue may not develop into an embryo (the beginning stage of a baby). Sometimes an embryo does develop but does not continue to grow. These problems can be seen on ultrasound.   MANAGEMNT OF EARLY PREGNANCY FAILURE: About 4 out of 100 (0.25%) women will have a pregnancy loss in her lifetime.  One in five pregnancies is found to be an early pregnancy failure.  There are 3 ways to care for an early pregnancy failure:   (1) Surgery, (2) Medicine, (3) Waiting for you to pass the pregnancy on your own. The decision as to how to proceed after being diagnosed with and early pregnancy failure is an individual one.  The decision can be made only after appropriate counseling.  You need to weigh the pros and cons of the 3 choices. Then you can make the choice that works for you. SURGERY (D&E)  Procedure over in 1 day  Requires being put to sleep  Bleeding may be light  Possible problems during surgery, including injury to womb(uterus)  Care provider has more control Medicine (CYTOTEC)  The complete procedure may take days to weeks  No Surgery  Bleeding may be heavy at times  There may be drug side effects  Patient has more control Waiting  You may choose to wait, in which case your own body may complete the passing of the abnormal early pregnancy on its own in about 2-4 weeks  Your bleeding may be heavy at times  There is a small possibility that you may need surgery if the bleeding is too much or not  all of the pregnancy has passed. CYTOTEC MANAGEMENT Prostaglandins (cytotec) are the most widely used drug for this purpose. They cause the uterus to cramp and contract. You will place the medicine yourself inside your vagina in the privacy of your home. Empting of the uterus should occur within 3 days but the process may continue for several weeks. The bleeding may seem heavy at times. POSSIBLE SIDE EFFECTS FROM CYTOTEC  Nausea   Vomiting  Diarrhea Fever  Chills  Hot Flashes Side effects  from the process of the early pregnancy failure include:  Cramping  Bleeding  Headaches  Dizziness RISKS: This is a low risk procedure. Less than 1 in 100 women has a complication. An incomplete passage of the early pregnancy may occur. Also, Hemorrhage (heavy bleeding) could happen.  Rarely the pregnancy will not be passed completely. Excessively heavy bleeding may occur.  Your doctor may need to perform surgery to empty the uterus (D&E). Afterwards: Everybody will feel differently after the early pregnancy completion. You may have soreness or cramps for a day or two. You may have soreness or cramps for day or two.  You may have light bleeding for up to 2 weeks. You may be as active as you feel like being. If you have any of the following problems you may call Maternity Admissions  Unit at [phone].  If you have pain that does not get better  with pain medication  Bleeding that soaks through 2 thick full-sized sanitary pads in an hour  Cramps that last longer than 2 days  Foul smelling discharge  Fever above 100.4 degrees F Even if you do not have any of these symptoms, you should have a follow-up exam to make sure you are healing properly. This appointment will be made for you before you leave the hospital. Your next normal period will start again in 4-6 week after the loss. You can get pregnant soon after the loss, so use birth control right away. Finally: Make sure all your questions are  answered before during and after any procedure. Follow up with medical care and family planning methods.       Miscarriage A miscarriage is the loss of an unborn baby (fetus) before the 20th week of pregnancy. Most miscarriages happen during the first 3 months of pregnancy. Sometimes, a miscarriage can happen before a woman knows that she is pregnant. Having a miscarriage can be an emotional experience. If you have had a miscarriage, talk with your health care provider about any questions you may have about miscarrying, the grieving process, and your plans for future pregnancy. What are the causes? A miscarriage may be caused by:  Problems with the genes or chromosomes of the fetus. These problems make it impossible for the baby to develop normally. They are often the result of random errors that occur early in the development of the baby, and are not passed from parent to child (not inherited).  Infection of the cervix or uterus.  Conditions that affect hormone balance in the body.  Problems with the cervix, such as the cervix opening and thinning before pregnancy is at term (cervical insufficiency).  Problems with the uterus. These may include: ? A uterus with an abnormal shape. ? Fibroids in the uterus. ? Congenital abnormalities. These are problems that were present at birth.  Certain medical conditions.  Smoking, drinking alcohol, or using drugs.  Injury (trauma). In many cases, the cause of a miscarriage is not known. What are the signs or symptoms? Symptoms of this condition include:  Vaginal bleeding or spotting, with or without cramps or pain.  Pain or cramping in the abdomen or lower back.  Passing fluid, tissue, or blood clots from the vagina. How is this diagnosed? This condition may be diagnosed based on:  A physical exam.  Ultrasound.  Blood tests.  Urine tests. How is this treated? Treatment for a miscarriage is sometimes not necessary if you naturally  pass all the tissue that was in your uterus. If necessary, this condition may be treated with:  Dilation and curettage (D&C). This is a procedure in which the cervix is stretched open and the lining of the uterus (endometrium) is scraped. This is done only if tissue from the fetus or placenta remains in the body (incomplete miscarriage).  Medicines, such as: ? Antibiotic medicine, to treat infection. ? Medicine to help the body pass any remaining tissue. ? Medicine to reduce (contract) the size of the uterus. These medicines may be given if you have a lot of bleeding. If you have Rh negative blood and your baby was Rh positive, you will need a shot of a medicine called Rh immunoglobulinto protect your future babies from Rh blood problems. "Rh-negative" and "Rh-positive" refer to whether or not the blood has a specific protein found on the surface of red blood  cells (Rh factor). Follow these instructions at home: Medicines   Take over-the-counter and prescription medicines only as told by your health care provider.  If you were prescribed antibiotic medicine, take it as told by your health care provider. Do not stop taking the antibiotic even if you start to feel better.  Do not take NSAIDs, such as aspirin and ibuprofen, unless they are approved by your health care provider. These medicines can cause bleeding. Activity  Rest as directed. Ask your health care provider what activities are safe for you.  Have someone help with home and family responsibilities during this time. General instructions  Keep track of the number of sanitary pads you use each day and how soaked (saturated) they are. Write down this information.  Monitor the amount of tissue or blood clots that you pass from your vagina. Save any large amounts of tissue for your health care provider to examine.  Do not use tampons, douche, or have sex until your health care provider approves.  To help you and your partner with  the process of grieving, talk with your health care provider or seek counseling.  When you are ready, meet with your health care provider to discuss any important steps you should take for your health. Also, discuss steps you should take to have a healthy pregnancy in the future.  Keep all follow-up visits as told by your health care provider. This is important. Where to find more information  The American Congress of Obstetricians and Gynecologists: www.acog.org  U.S. Department of Health and [place], systems of Womens Health: [place] Contact a health care provider if:  You have a fever or chills.  You have a foul smelling vaginal discharge.  You have more bleeding instead of less. Get help right away if:  You have severe cramps or pain in your back or abdomen.  You pass blood clots or tissue from your vagina that is walnut-sized or larger.  You soak more than 1 regular sanitary pad in an hour.  You become light-headed or weak.  You pass out.  You have feelings of sadness that take over your thoughts, or you have thoughts of hurting yourself. Summary  Most miscarriages happen in the first 3 months of pregnancy. Sometimes miscarriage happens before a woman even knows that she is pregnant.  Follow your health care provider's instruction for home care. Keep all follow-up appointments.  To help you and your partner with the process of grieving, talk with your health care provider or seek counseling. This information is not intended to replace advice given to you by your health care provider. Make sure you discuss any questions you have with your health care provider. Document Revised: 06/22/2018 Document Reviewed: 04/05/2016 Elsevier Patient Education  [place].

## 2019-09-23 NOTE — ED Triage Notes (Signed)
Pt presents to ED POv. Pt c/o heavy vaginal bleeding and abd cramping that began 90m ago. Pt is 10w and 4d pregnant

## 2019-09-23 NOTE — ED Notes (Signed)
Report called to [facility] charge

## 2019-09-23 NOTE — ED Provider Notes (Signed)
[facility] EMERGENCY DEPARTMENT Provider Note   CSN: [id number] Arrival date & time: 09/23/19  0546     History Chief Complaint  Patient presents with   Vaginal Bleeding    Kimberly Bishop is a 29 y.o. female.  The history is provided by the patient and medical records.   29 y.o. G3P1 approx [redacted]w[redacted]d gestation with history of HIV/AIDS (last CD4 on 08/21/19 was 61), HTN, presenting to the ED with vaginal bleeding.  States it just began tonight, lots of cramping across her lower abdomen and passage of clots noted.  No vomiting, diarrhea.  No urinary symptoms or noted vaginal discharge.  States she see's women[facility] clinic for her OB care. She is currently taking prenatal vitamins.  Reports history of miscarriage with her first pregnancy and was concerned for same.  Past Medical History:  Diagnosis Date   AIDS (acquired immunodeficiency syndrome), CD4 <=200 ([place])    History of kidney stones    HIV (human immunodeficiency virus infection) ([place])    Hypertension    Immune deficiency disorder ([place])    Infection    UTI   Kidney stones     Patient Active Problem List   Diagnosis Date Noted   Supervision of high risk pregnancy, antepartum 09/11/2019   Short interval between pregnancies affecting pregnancy, antepartum    AIDS (acquired immunodeficiency syndrome), CD4 <=200 ([place])    Positive pregnancy test 08/21/2019   History of cesarean delivery 04/29/2019   History of preterm delivery 04/29/2019   History of pre-eclampsia 03/29/2019   HIV (human immunodeficiency virus infection) ([place]) 03/29/2019   Rubella nonimmune status, delivered, current hospitalization 02/15/2019   Hematuria, gross 01/08/2019   Diastasis recti 12/06/2018   CIN I (cervical intraepithelial neoplasia I) 11/28/2018   ASCUS with positive high risk HPV cervical 11/26/2018   Encounter for long-term (current) use of high-risk medication 03/28/2017   Molluscum contagiosum  03/22/2016    Past Surgical History:  Procedure Laterality Date   APPENDECTOMY  2011   CESAREAN SECTION N/A 03/29/2019   Procedure: CESAREAN SECTION;  Surgeon: Jonnie Kind, MD;  Location: [facility] LD ORS;  Service: Obstetrics;  Laterality: N/A;   CYSTOSCOPY W/ URETERAL STENT PLACEMENT Right 11/03/2016   Procedure: CYSTOSCOPY WITH RETROGRADE PYELOGRAM/URETERAL STENT PLACEMENT;  Surgeon: Kathie Rhodes, MD;  Location: [facility] ORS;  Service: Urology;  Laterality: Right;   CYSTOSCOPY/URETEROSCOPY/HOLMIUM LASER/STENT PLACEMENT Right 01/16/2017   Procedure: CYSTOSCOPY/URETEROSCOPY/ STONE EXTRACTION/STENT removal;  Surgeon: Kathie Rhodes, MD;  Location: [facility] ORS;  Service: Urology;  Laterality: Right;   IR DIL URETER RIGHT  12/05/2016   IR NEPHROSTOMY PLACEMENT RIGHT  11/07/2016   IR URETERAL STENT PLACEMENT EXISTING ACCESS RIGHT  12/05/2016   NEPHROLITHOTOMY Right 12/05/2016   Procedure: NEPHROLITHOTOMY PERCUTANEOUS;  Surgeon: Kathie Rhodes, MD;  Location: [facility]S;  Service: Urology;  Laterality: Right;     OB History    Gravida  3   Para  1   Term  0   Preterm  1   AB  1   Living  1     SAB  1   TAB  0   Ectopic  0   Multiple  0   Live Births  1           Family History  Adopted: Yes  Problem Relation Age of Onset   Hypertension Mother    Autism Brother     Social History   Tobacco Use   Smoking status: Never Smoker   Smokeless  tobacco: Never Used  Vaping Use   Vaping Use: Never used  Substance Use Topics   Alcohol use: Not Currently    Comment: occasional -    Drug use: Not Currently    Types: Marijuana    Comment: last use 08/24/18    Home Medications Prior to Admission medications   Medication Sig Start Date End Date Taking? Authorizing Provider  Blood Pressure Monitoring DEVI 1 Device by Does not apply route once a week. ICD 10: O09.90 Patient not taking: Reported on 08/26/2019 10/18/18   [place] Mode, MD  dolutegravir (TIVICAY) 50 MG tablet Take  1 tablet (50 mg total) by mouth daily. 08/28/19   [place] Callas, NP  emtricitabine-tenofovir (TRUVADA) 200-300 MG tablet Take 1 tablet by mouth daily. 08/28/19   [place] Callas, NP  Prenatal Vit-Fe Fumarate-FA (PRENATAL VITAMINS) 28-0.8 MG TABS Take 1 tablet by mouth daily.    [provider]    Allergies    Patient has no known allergies.  Review of Systems   Review of Systems  Genitourinary: Positive for vaginal bleeding.  All other systems reviewed and are negative.   Physical Exam Updated Vital Signs BP 129/76 (BP Location: Right Arm)    Pulse 72    Temp 98.3 F (36.8 C) (Oral)    Resp 18    Ht 5\' 2"  (1.575 m)    Wt 64.9 kg    LMP 07/11/2019 (Within Days)    SpO2 100%    BMI 26.16 kg/m   Physical Exam Vitals and nursing note reviewed.  Constitutional:      Appearance: She is well-developed.  HENT:     Head: Normocephalic and atraumatic.  Eyes:     Conjunctiva/sclera: Conjunctivae normal.     Pupils: Pupils are equal, round, and reactive to light.  Cardiovascular:     Rate and Rhythm: Normal rate and regular rhythm.     Heart sounds: Normal heart sounds.  Pulmonary:     Effort: Pulmonary effort is normal. No respiratory distress.     Breath sounds: Normal breath sounds. No rhonchi.  Abdominal:     General: Bowel sounds are normal.     Palpations: Abdomen is soft.     Tenderness: There is no abdominal tenderness. There is no rebound.  Musculoskeletal:        General: Normal range of motion.     Cervical back: Normal range of motion.  Skin:    General: Skin is warm and dry.  Neurological:     Mental Status: She is alert and oriented to person, place, and time.     ED Results / Procedures / Treatments   Labs (all labs ordered are listed, but only abnormal results are displayed) Labs Reviewed - No data to display  EKG None  Radiology No results found.  Procedures Procedures (including critical care time)  Medications Ordered in  ED Medications - No data to display  ED Course  I have reviewed the triage vital signs and the nursing notes.  Pertinent labs & imaging results that were available during my care of the patient were reviewed by me and considered in my medical decision making (see chart for details).    MDM Rules/Calculators/A&P  29 y.o. G3P1 approx [redacted]w[redacted]d here with vaginal bleeding that began tonight.  Has associated lower abdominal cramping and passage of clots.  Denies urinary symptoms or vaginal discharge.  She is hemodynamically stable here.  Does have history of HIV/AIDS, no recent infectious symptoms  or fevers.  Discussed with OB-GYN, Dr. Ilda Basset-- will send over to [facility] for further work-up and ongoing care.  Final Clinical Impression(s) / ED Diagnoses Final diagnoses:  Vaginal bleeding in pregnancy    Rx / DC Orders ED Discharge Orders    None       Larene Pickett, PA-C 09/23/19 [place], MD 09/23/19 [phone]

## 2019-09-25 ENCOUNTER — Encounter: Payer: Medicaid Other | Admitting: Obstetrics and Gynecology

## 2019-09-25 LAB — SURGICAL PATHOLOGY

## 2019-10-01 ENCOUNTER — Other Ambulatory Visit: Payer: Self-pay

## 2019-10-08 ENCOUNTER — Ambulatory Visit: Payer: Medicaid Other | Admitting: Student

## 2019-10-09 ENCOUNTER — Encounter (INDEPENDENT_AMBULATORY_CARE_PROVIDER_SITE_OTHER): Payer: Medicaid Other | Admitting: Obstetrics and Gynecology

## 2019-10-11 ENCOUNTER — Telehealth: Payer: Self-pay

## 2019-10-11 ENCOUNTER — Ambulatory Visit (INDEPENDENT_AMBULATORY_CARE_PROVIDER_SITE_OTHER): Payer: Medicaid Other | Admitting: Obstetrics and Gynecology

## 2019-10-11 ENCOUNTER — Other Ambulatory Visit: Payer: Self-pay

## 2019-10-11 ENCOUNTER — Encounter: Payer: Self-pay | Admitting: Obstetrics and Gynecology

## 2019-10-11 VITALS — BP 136/83 | HR 78

## 2019-10-11 DIAGNOSIS — R87810 Cervical high risk human papillomavirus (HPV) DNA test positive: Secondary | ICD-10-CM | POA: Diagnosis not present

## 2019-10-11 DIAGNOSIS — N870 Mild cervical dysplasia: Secondary | ICD-10-CM | POA: Diagnosis not present

## 2019-10-11 DIAGNOSIS — R87610 Atypical squamous cells of undetermined significance on cytologic smear of cervix (ASC-US): Secondary | ICD-10-CM | POA: Diagnosis not present

## 2019-10-11 DIAGNOSIS — Z975 Presence of (intrauterine) contraceptive device: Secondary | ICD-10-CM | POA: Insufficient documentation

## 2019-10-11 DIAGNOSIS — Z8759 Personal history of other complications of pregnancy, childbirth and the puerperium: Secondary | ICD-10-CM

## 2019-10-11 DIAGNOSIS — Z3046 Encounter for surveillance of implantable subdermal contraceptive: Secondary | ICD-10-CM | POA: Diagnosis not present

## 2019-10-11 DIAGNOSIS — Z30017 Encounter for initial prescription of implantable subdermal contraceptive: Secondary | ICD-10-CM | POA: Insufficient documentation

## 2019-10-11 MED ORDER — ETONOGESTREL 68 MG ~~LOC~~ IMPL
68.0000 mg | DRUG_IMPLANT | Freq: Once | SUBCUTANEOUS | Status: AC
  Administered 2019-10-11: 68 mg via SUBCUTANEOUS

## 2019-10-11 MED ORDER — BIKTARVY 50-200-25 MG PO TABS: 1.0000 | ORAL_TABLET | Freq: Every day | ORAL | 5 refills | Status: DC

## 2019-10-11 NOTE — Telephone Encounter (Signed)
I am sorry to hear this for her. Let her know I will be thinking about her.   Please have her resume her Biktarvy once a day -  I sent in Rx for her to [facility] [place].   I have an appointment with her in August so we can hopefully plan to transition her to [place] injections at that time if she still wants to consider that. I do need to have her viral load as low as possible before we can make that switch though.

## 2019-10-11 NOTE — Progress Notes (Signed)
GYNECOLOGY OFFICE VISIT NOTE  History:   Kimberly Bishop is a 29 y.o. [phone] here today for follow up SAB. She denies any abnormal vaginal discharge/odor, bleeding, pelvic pain or other concerns. She was initially seen in [facility] 09/23/19 for vaginal bleeding and diagnosed with a miscarriage at that time. She was medically managed with cytotec and presents today for follow up. All symptoms have since resolved. No fever/chills.   She is also interested in contraception today. Has not had menstrual cycle since SAB. No history of VTE.    Past Medical History:  Diagnosis Date  . AIDS (acquired immunodeficiency syndrome), CD4 <=200 ([place])   . History of kidney stones   . HIV (human immunodeficiency virus infection) ([place])   . Hypertension   . Immune deficiency disorder ([place])   . Infection    UTI  . Kidney stones     Past Surgical History:  Procedure Laterality Date  . APPENDECTOMY  2011  . CESAREAN SECTION N/A 03/29/2019   Procedure: CESAREAN SECTION;  Surgeon: Jonnie Kind, MD;  Location: [facility];  Service: Obstetrics;  Laterality: N/A;  . CYSTOSCOPY W/ URETERAL STENT PLACEMENT Right 11/03/2016   Procedure: CYSTOSCOPY WITH RETROGRADE PYELOGRAM/URETERAL STENT PLACEMENT;  Surgeon: Kathie Rhodes, MD;  [facility];  Service: Urology;  Laterality: Right;  . CYSTOSCOPY/URETEROSCOPY/HOLMIUM LASER/STENT PLACEMENT Right 01/16/2017   Procedure: CYSTOSCOPY/URETEROSCOPY/ STONE EXTRACTION/STENT removal;  Surgeon: Kathie Rhodes, MD;  Location: [facility];  Service: Urology;  Laterality: Right;  . IR DIL URETER RIGHT  12/05/2016  . IR NEPHROSTOMY PLACEMENT RIGHT  11/07/2016  . IR URETERAL STENT PLACEMENT EXISTING ACCESS RIGHT  12/05/2016  . NEPHROLITHOTOMY Right 12/05/2016   Procedure: NEPHROLITHOTOMY PERCUTANEOUS;  Surgeon: Kathie Rhodes, MD;  Location: [facility];  Service: Urology;  Laterality: Right;    The following portions of the patient's history were reviewed and updated as appropriate:  allergies, current medications, past family history, past medical history, past social history, past surgical history and problem list.   Health Maintenance:  Abnormal pap 05/2019 ASCUS, HRHPV, needs colpo, will schedule. History of colpo with CIN I in 11/2018.   Review of Systems:  Pertinent items noted in HPI and remainder of comprehensive ROS otherwise negative.  Physical Exam:  BP (!) 136/83   Pulse 78   LMP 07/11/2019 (Within Days)   Breastfeeding Unknown  CONSTITUTIONAL: Well-developed, well-nourished female in no acute distress.  HEENT:  Normocephalic, atraumatic. External right and left ear normal. No scleral icterus.  NECK: Normal range of motion, supple, no masses noted on observation SKIN: No rash noted. Not diaphoretic. No erythema. No pallor. MUSCULOSKELETAL: Normal range of motion. No edema noted. NEUROLOGIC: Alert and oriented to person, place, and time. Normal muscle tone coordination. No cranial nerve deficit noted. PSYCHIATRIC: Normal mood and affect. Normal behavior. Normal judgment and thought content. CARDIOVASCULAR: Normal heart rate noted RESPIRATORY: Effort and breath sounds normal, no problems with respiration noted ABDOMEN: No masses noted. No other overt distention noted.   PELVIC: Deferred  Labs and Imaging No results found for this or any previous visit (from the past 168 hour(s)). US OB LESS THAN 14 WEEKS WITH OB TRANSVAGINAL  Result Date: 09/23/2019 CLINICAL DATA:  Vaginal bleeding and cramping EXAM: OBSTETRIC <14 WK [place] AND TRANSVAGINAL OB US TECHNIQUE: Both transabdominal and transvaginal ultrasound examinations were performed for complete evaluation of the gestation as well as the maternal uterus, adnexal regions, and pelvic cul-de-sac. Transvaginal technique was performed to assess early pregnancy. COMPARISON:  None. FINDINGS:  Intrauterine gestational sac: Not visualized Yolk sac:  Not visualized Embryo:  Not visualized Cardiac Activity: Not visualized  Subchorionic hemorrhage:  None visualized. Maternal uterus/adnexae: Cervical os is closed. Endometrium is thickened measuring 23 mm in thickness. The endometrium is somewhat inhomogeneous. There is a cyst arising from the right ovary measuring 1.8 x 1.7 x 1.7 cm. No other extrauterine pelvic mass. No free pelvic fluid. IMPRESSION: No intrauterine gestation seen. Endometrium is thickened and mildly inhomogeneous. Assuming positive beta HCG, differential considerations include intrauterine gestation too early to be seen by either transabdominal transvaginal technique; recent spontaneous abortion; ectopic gestation. This circumstance warrants close clinical and laboratory correlation. Timing of repeat ultrasound in large part will depend on beta HCG values going forward. Simple cyst right ovary which may represent a focal corpus luteum. No other extrauterine pelvic mass. No free pelvic fluid. Electronically Signed   By: Lowella Grip III M.D.   On: 09/23/2019 08:09        Assessment and Plan:      29yo [id number] presenting for SAB f/u today.  History of spontaneous abortion Symptoms resolved. [place]illi noted on surg path 09/23/19. TVUS performed 09/23/19. No further workup or intervention at this time.  Encounter for surveillance of implantable subdermal contraceptive Nexplanon insertion Patient has not had menstrual cycle since SAB. Patient desires nexplanon for contraception, risks/benefits discussed and procedure performed and tolerated well, refer to separate procedure note for details. 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 Pap 05/2019 demonstrated ASCUS with HR HPV, recommendation initially for colpo postpartum. -Schedule colposcopy in 2-3 months  Routine preventative health maintenance measures emphasized. Please refer to After Visit Summary for other counseling recommendations.   Return in about 2 months (around 12/12/2019)  for colposcopy/nexplanon f/u.    Total face-to-face time with patient: 30 minutes.  Over 50% of encounter was spent on counseling and coordination of care.   Arrie Senate, MD OB Fellow, [facility] 10/11/2019 9:49 AM

## 2019-10-11 NOTE — Addendum Note (Signed)
Addended by: Michel Harrow on: 10/11/2019 10:43 AM   Modules accepted: Orders

## 2019-10-11 NOTE — Patient Instructions (Signed)

## 2019-10-11 NOTE — Addendum Note (Signed)
Addended by: [place] Callas on: 10/11/2019 12:10 PM   Modules accepted: Orders

## 2019-10-11 NOTE — Telephone Encounter (Signed)
Patient called office today to update Janene Madeira, NP that she had a miscarriage. Would like to know which medication she should take now. Will forward message to provider. [place]

## 2019-10-11 NOTE — Procedures (Signed)
Nexplanon Insertion Procedure Note  Patient was identified. Informed consent was signed, signed copy in chart. A time-out was performed.    The insertion site was identified 8-10 cm (3-4 inches) from the medial epicondyle of the humerus and 3-5 cm (1.25-2 inches) posterior to (below) the sulcus (groove) between the biceps and triceps muscles of the patient's left arm and marked. The site was prepped in the usual sterile fashion. Pt was prepped with alcohol swab and then injected with 5 cc of 1% lidocaine w/ epi. The site was prepped with betadine x2. Nexplanon removed form packaging,  Device confirmed in needle, then inserted full length of needle and withdrawn per handbook instructions. Provider and patient verified presence of the implant in the woman's arm by palpation. Pt insertion site was covered with adhesive bandage and pressure bandage. There was minimal blood loss. Patient tolerated procedure well.  Patient was given post procedure instructions and Nexplanon user card with expiration date. Condoms were recommended for STI prevention. Patient was asked to keep the pressure dressing on for 24 hours to minimize bruising and keep the adhesive bandage on for 3-5 days. The patient verbalized understanding of the plan of care and agrees.   Lot #[id number] Exp 24/46/2863  Arrie Senate, MD OB Fellow, [place] [facility] 10/11/2019 9:51 AM

## 2019-10-11 NOTE — Progress Notes (Signed)
Patient did not keep her appointment today.  Noni Saupe I, NP 10/11/2019 8:42 AM

## 2019-10-14 ENCOUNTER — Other Ambulatory Visit: Payer: Self-pay | Admitting: Obstetrics & Gynecology

## 2019-10-14 NOTE — Telephone Encounter (Signed)
Called patient back regarding regimen. Informed her she could restart Biktarvy once a day. Pt verbalized understanding. [place]

## 2019-10-16 ENCOUNTER — Telehealth: Payer: Medicaid Other | Admitting: Infectious Diseases

## 2019-10-21 ENCOUNTER — Other Ambulatory Visit: Payer: Medicaid Other

## 2019-10-21 ENCOUNTER — Ambulatory Visit: Payer: Medicaid Other

## 2019-10-24 ENCOUNTER — Other Ambulatory Visit: Payer: Medicaid Other

## 2019-11-08 ENCOUNTER — Telehealth (INDEPENDENT_AMBULATORY_CARE_PROVIDER_SITE_OTHER): Payer: Medicaid Other | Admitting: Infectious Diseases

## 2019-11-08 ENCOUNTER — Other Ambulatory Visit: Payer: Self-pay

## 2019-11-08 DIAGNOSIS — N870 Mild cervical dysplasia: Secondary | ICD-10-CM

## 2019-11-08 DIAGNOSIS — B20 Human immunodeficiency virus [HIV] disease: Secondary | ICD-10-CM

## 2019-11-21 ENCOUNTER — Ambulatory Visit: Payer: Medicaid Other

## 2019-11-21 ENCOUNTER — Other Ambulatory Visit: Payer: Medicaid Other

## 2019-12-02 ENCOUNTER — Ambulatory Visit: Payer: Medicaid Other | Admitting: Obstetrics and Gynecology

## 2019-12-08 NOTE — Assessment & Plan Note (Signed)
Due for a repeat colposcopy in October.

## 2019-12-08 NOTE — Assessment & Plan Note (Signed)
Kimberly Bishop continues to have some struggles getting her medications and once daily.  She is getting about 80% of the doses in she puts forth good effort.  Been a while since we have had lab work on her.  I asked her to please schedule appointment for lab work in the next 2 months or so so we can determine ability to transition to [facility] injectable management.  If her viral load is too high I hesitate to get her started on this due to concern for acquired resistance to medications. Follow-up 3 months.  Recommend flu shot in October. Would recommend Covid vaccines as well, would prefer 3 dose regimen with either [facility].  Given immunocompromise state with CD4 count chronically below 200.Marland Kitchen

## 2019-12-08 NOTE — Progress Notes (Signed)
Name: Kimberly Bishop  DOB: 1991/02/14 MRN: [id number] PCP: Patient, No Pcp Per  OB/GYN: [name], MD   VIRTUAL CARE ENCOUNTER  I connected with [name] on 12/08/19 at  8:45 AM EDT by TELEPHONE and verified that I am speaking with the correct person using two identifiers.   I discussed the limitations, risks, security and privacy concerns of performing an evaluation and management service by virtual service and the availability of in person appointments. I also discussed with the patient that there may be a patient responsible charge related to this service. The patient expressed understanding and agreed to proceed.  Patient Location: [employer] residence  Other Participants:   Provider Location: [facility] Office   Patient Active Problem List   Diagnosis Date Noted  . Nexplanon in place 10/11/2019  . 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 History of cesarean delivery 04/29/2019  . History of preterm delivery 04/29/2019  . History of pre-eclampsia 03/29/2019  . HIV (human immunodeficiency virus infection) ([place]) 03/29/2019  . Diastasis recti 12/06/2018  . CIN I (cervical intraepithelial neoplasia I) 11/28/2018  . ASCUS with positive high risk HPV cervical 11/26/2018  . Encounter for long-term (current) use of high-risk medication 03/28/2017  . Molluscum contagiosum 03/22/2016     Brief Narrative:  Kimberly Bishop is a 29 y.o. female with HIV disease. CD4 nadir 90, likely less unrecorded VL: she has never been undetectable HIV Risk: Perinatal History of OIs: Intake Labs 2013: Hep B sAg (-), sAb (-), cAb (-); Hep A (-), Hep C (-) Quantiferon () HLA B*5701 (-) G6PD: ()   Previous Regimens: . Tivicay + Odefsey . Biktarvy . Tivicay + Truvada (pregnancy 10-2018)  Genotypes: . 05/2018: wildtype virus  . 02/18/19: no significant mutations including integrase     Subjective:  CC: HIV follow up care.  Recently had a  miscarriage.    HPI: [facility] and was diagnosed with a miscarriage at that time.  This was managed medically with Cytotec.  At follow-up office visit with GYN team she underwent placement of a Nexplanon for contraception management.  She states she is doing okay overall.  Back on her Biktarvy once daily.  Still having a hard time with medications and adherence.  She is very much anticipating starting the intramuscular management with Cabotegravir monthly.  And is requesting an update on this today.  She had a pap smear in July with OB/GYN team - read as ASCUS with +HPV.  Underwent colposcopy 11/27/2018 revealing CIN1.  GYN recommending a repeat colposcopy sometime in October.   Review of Systems  All other systems reviewed and are negative.   Past Medical History:  Diagnosis Date  . 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 History of kidney stones   . HIV (human immunodeficiency virus infection) ([place])   . Hypertension   . Immune deficiency disorder ([place])   . Infection    UTI  . Kidney stones     Outpatient Medications Prior to Visit  Medication Sig Dispense Refill  . bictegravir-emtricitabine-tenofovir AF (BIKTARVY) 50-200-25 MG TABS tablet Take 1 tablet by mouth daily. 30 tablet 5  . Blood Pressure Monitoring DEVI 1 Device by Does not apply route once a week. ICD 10: O09.90 (Patient not taking: Reported on 08/26/2019) 1 Device 0  . misoprostol (CYTOTEC) 200 MCG tablet Take 5 tablets (1,000 mcg total) by mouth once for  1 dose. 4 tablet 0  . Prenatal Vit-Fe Fumarate-FA (PRENATAL VITAMINS) 28-0.8 MG TABS Take 1 tablet by mouth daily. (Patient not taking: Reported on 10/11/2019)     No facility-administered medications prior to visit.     No Known Allergies  Social History   Tobacco Use  . Smoking status: Never Smoker  . Smokeless tobacco: Never Used  Vaping Use  . Vaping Use:  Never used  Substance Use Topics  . Alcohol use: Not Currently    Comment: occasional -   . Drug use: Not Currently    Types: Marijuana    Comment: last use 08/24/18    Social History   Substance and Sexual Activity  Sexual Activity Yes  . Partners: Male  . Birth control/protection: None     Objective:   There were no vitals filed for this visit. There is no height or weight on file to calculate BMI.  Physical Exam Pulmonary:     Effort: Pulmonary effort is normal.     Comments: No shortness of breath detected in conversation.  Neurological:     Mental Status: She is oriented to person, place, and time.  Psychiatric:        Mood and Affect: Mood normal.        Behavior: Behavior normal.        Thought Content: Thought content normal.        Judgment: Judgment normal.     Lab Results Lab Results  Component Value Date   WBC 7.2 09/23/2019   HGB 10.7 (L) 09/23/2019   HCT 33.5 (L) 09/23/2019   MCV 81.5 09/23/2019   PLT 292 09/23/2019    Lab Results  Component Value Date   CREATININE 0.92 03/29/2019   BUN 12 03/29/2019   NA 134 (L) 03/29/2019   K 4.6 03/29/2019   CL 106 03/29/2019   CO2 19 (L) 03/29/2019    Lab Results  Component Value Date   ALT 29 08/21/2019   AST 21 08/21/2019   ALKPHOS 183 (H) 03/29/2019   BILITOT 0.4 08/21/2019    Lab Results  Component Value Date   CHOL 177 03/28/2017   HDL 44 (L) 03/28/2017   LDLCALC 119 (H) 03/28/2017   TRIG 58 03/28/2017   CHOLHDL 4.0 03/28/2017   HIV 1 RNA Quant (copies/mL)  Date Value  03/26/2019 136 (H)  02/18/2019 1,480 (H)  02/04/2019 4,750 (H)   CD4 T Cell Abs (/uL)  Date Value  08/21/2019 67 (L)  02/04/2019 68 (L)  10/03/2018 94 (L)     Assessment & Plan:   Problem List Items Addressed This Visit      Unprioritized   HIV (human immunodeficiency virus infection) ([place]) (Chronic)    Kimberly Bishop continues to have some struggles getting her medications and once daily.  She is getting about 80%  of the doses in she puts forth good effort.  Been a while since we have had lab work on her.  I asked her to please schedule appointment for lab work in the next 2 months or so so we can determine ability to transition to [facility] injectable management.  If her viral load is too high I hesitate to get her started on this due to concern for acquired resistance to medications. Follow-up 3 months.  Recommend flu shot in October. Would recommend Covid vaccines as well, would prefer 3 dose regimen with either [facility].  Given immunocompromise state with CD4 count chronically below 200.Marland Kitchen  Relevant Orders   HIV-1 RNA quant-no reflex-bld   T-helper cell (CD4)- ([facility] clinic only)   CIN I (cervical intraepithelial neoplasia I)    Due for a repeat colposcopy in October.      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        Follow Up Instructions: RTC in a few weeks for repeat VL and CD4 - would like to start injectable Cabenuva for treatment for her. Continue Biktarvy and Bactrim once daily.   I discussed the assessment and treatment plan with the patient. The patient was provided an opportunity to ask questions and all were answered. The patient agreed with the plan and demonstrated an understanding of the instructions.   The patient was advised to call back or seek an in-person evaluation if the symptoms worsen or if the condition fails to improve as anticipated.  I provided 12 minutes of non-face-to-face time during this encounter.   Janene Madeira, MSN, NP-C [facility] for Infectious [place] Pager: [phone] Office: [phone]  12/08/19  6:39 PM

## 2019-12-08 NOTE — Assessment & Plan Note (Signed)
Continue once daily Bactrim.

## 2019-12-19 ENCOUNTER — Other Ambulatory Visit: Payer: Medicaid Other

## 2020-01-07 ENCOUNTER — Encounter: Payer: Medicaid Other | Admitting: Infectious Diseases

## 2020-02-25 ENCOUNTER — Telehealth: Payer: Self-pay

## 2020-02-25 NOTE — Telephone Encounter (Signed)
[facility] Patient Advocate Encounter   Received notification from [employer] that prior authorization for CABENUVA is required.   PA submitted on 02/25/20 Key [id number] Status is pending    [facility] will continue to follow.   [name], [facility] Patient [facility] for Infectious Disease Phone: [phone] Fax:  [phone]

## 2020-03-09 NOTE — Telephone Encounter (Addendum)
PA for Cabenuva was denied.  Kimberly Bishop - she has insurance, not just regular Medicaid and they denied Cabenuva for her.

## 2020-03-09 NOTE — Telephone Encounter (Signed)
That's so sad to me.  Any chance I can appeal and talk with them? She has such advanced disease and would be much more successful on this.  If not, we can re-explore in 6-12 months to see if they adopt it then.

## 2020-03-09 NOTE — Telephone Encounter (Signed)
I can write an appeal letter. They want her to "try and fail" 3 alternatives, which I think we can prove that has happened.

## 2020-03-09 NOTE — Telephone Encounter (Signed)
Kimberly Bishop and Tivicay/Truvada I believe are the three more recent ones.  She is going to get some opportunistic infection at some point or at risk for death.  Thanks for helping write an appeal letter.

## 2020-03-11 ENCOUNTER — Other Ambulatory Visit: Payer: Self-pay

## 2020-03-11 ENCOUNTER — Encounter: Payer: Self-pay | Admitting: Family

## 2020-03-11 ENCOUNTER — Telehealth (INDEPENDENT_AMBULATORY_CARE_PROVIDER_SITE_OTHER): Payer: BC Managed Care – PPO | Admitting: Family

## 2020-03-11 ENCOUNTER — Other Ambulatory Visit: Payer: Self-pay | Admitting: Family

## 2020-03-11 DIAGNOSIS — B349 Viral infection, unspecified: Secondary | ICD-10-CM

## 2020-03-11 NOTE — Assessment & Plan Note (Signed)
Kimberly Bishop is a 29 y/o female with virus like symptoms with differentials including common cold, influenza or COVID. Rapid antigen test was negative this morning. Encouraged continued symptom management with work note provided. Recommended re-testing in the next 24-48 hours and possible PCR test. Advised to seek additional care if symptoms worsen or shortness of breath develops. Will isolate pending secondary antigen testing.

## 2020-03-11 NOTE — Progress Notes (Signed)
Subjective:    Patient ID: France Ravens, female    DOB: 1990/07/03, 29 y.o.   MRN: [id number]  Chief Complaint  Patient presents with  . Follow-up    Patient complains of cough, night sweats, and chills that started last night      Virtual Visit via Telephone/Video Note   I connected with Ms. Homer on 03/11/2020 at 4:15 pm by video  and verified that I am speaking with the correct person using two identifiers.   I discussed the limitations, risks, security and privacy concerns of performing an evaluation and management service by telephone and the availability of in person appointments. I also discussed with the patient that there may be a patient responsible charge related to this service. The patient expressed understanding and agreed to proceed.  Location:  Patient: Home Provider: [facility]   HPI:  ZAYA SHIPLEY is a 28 y.o. female with HIV disease presenting today for an acute office visit.    Ms. Whalley began having symptoms on 12/28 including headache, sweating, chills, and subjective fevers. She is an employee at the [employer] and there have been positive COVID cases recently. She wears a mask while at work with no eye protection. Not vaccinated. She is currently pregnant. Completed at home test antigen test this morning which was negative.   No Known Allergies    Outpatient Medications Prior to Visit  Medication Sig Dispense Refill  . bictegravir-emtricitabine-tenofovir AF (BIKTARVY) 50-200-25 MG TABS tablet Take 1 tablet by mouth daily. 30 tablet 5  . Blood Pressure Monitoring DEVI 1 Device by Does not apply route once a week. ICD 10: O09.90 1 Device 0  . enalapril (VASOTEC) 5 MG tablet Take 5 mg by mouth daily.    . misoprostol (CYTOTEC) 200 MCG tablet Take 5 tablets (1,000 mcg total) by mouth once for 1 dose. 4 tablet 0  . Prenatal Vit-Fe Fumarate-FA (PRENATAL VITAMINS) 28-0.8 MG TABS Take 1 tablet by mouth daily. (Patient not  taking: Reported on 10/11/2019)     No facility-administered medications prior to visit.     Past Medical History:  Diagnosis Date  . 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 History of kidney stones   . HIV (human immunodeficiency virus infection) ([place])   . Hypertension   . Immune deficiency disorder ([place])   . Infection    UTI  . Kidney stones      Past Surgical History:  Procedure Laterality Date  . APPENDECTOMY  2011  . CESAREAN SECTION N/A 03/29/2019   Procedure: CESAREAN SECTION;  Surgeon: Jonnie Kind, MD;  Location: [facility] LD ORS;  Service: Obstetrics;  Laterality: N/A;  . CYSTOSCOPY W/ URETERAL STENT PLACEMENT Right 11/03/2016   Procedure: CYSTOSCOPY WITH RETROGRADE PYELOGRAM/URETERAL STENT PLACEMENT;  Surgeon: Kathie Rhodes, MD;  Location: [facility] ORS;  Service: Urology;  Laterality: Right;  . CYSTOSCOPY/URETEROSCOPY/HOLMIUM LASER/STENT PLACEMENT Right 01/16/2017   Procedure: CYSTOSCOPY/URETEROSCOPY/ STONE EXTRACTION/STENT removal;  Surgeon: Kathie Rhodes, MD;  Location: [facility] ORS;  Service: Urology;  Laterality: Right;  . IR DIL URETER RIGHT  12/05/2016  . IR NEPHROSTOMY PLACEMENT RIGHT  11/07/2016  . IR URETERAL STENT PLACEMENT EXISTING ACCESS RIGHT  12/05/2016  . NEPHROLITHOTOMY Right 12/05/2016   Procedure: NEPHROLITHOTOMY PERCUTANEOUS;  Surgeon: Kathie Rhodes, MD;  Location: [facility] ORS;  Service: Urology;  Laterality: Right;     Review of Systems  Constitutional: Positive for chills, fatigue and fever. Negative for appetite change, diaphoresis and unexpected weight change.  Eyes:       Negative for acute change in vision  Respiratory: Positive for cough. Negative for chest tightness, shortness of breath and wheezing.   Cardiovascular: Negative for chest pain.  Gastrointestinal: Negative for diarrhea, nausea and vomiting.  Genitourinary: Negative for dysuria, pelvic pain and vaginal discharge.  Musculoskeletal: Negative for neck pain and neck stiffness.   Skin: Negative for rash.  Neurological: Positive for headaches. Negative for seizures, syncope and weakness.  Hematological: Negative for adenopathy. Does not bruise/bleed easily.  Psychiatric/Behavioral: Negative for hallucinations.      Objective:    Nursing note and vital signs reviewed.    Ms. Lefevre was seated and pleasant to speak with. She was able to talk in complete sentences with occasional cough.  Assessment & Plan:   Problem List Items Addressed This Visit      Other   Viral illness    Ms. Scheibe is a 29 y/o female with virus like symptoms with differentials including common cold, influenza or COVID. Rapid antigen test was negative this morning. Encouraged continued symptom management with work note provided. Recommended re-testing in the next 24-48 hours and possible PCR test. Advised to seek additional care if symptoms worsen or shortness of breath develops. Will isolate pending secondary antigen testing.           I am having Juanetta C. Creedon maintain her Blood Pressure Monitoring, Prenatal Vitamins, misoprostol, Biktarvy, and enalapril.   I discussed the assessment and treatment plan with the patient. The patient was provided an opportunity to ask questions and all were answered. The patient agreed with the plan and demonstrated an understanding of the instructions.   The patient was advised to call back or seek an in-person evaluation if the symptoms worsen or if the condition fails to improve as anticipated.   I provided 11  minutes of non-face-to-face time during this encounter.  Follow-up: Return if symptoms worsen or fail to improve.   Marcos Eke, MSN, FNP-C Nurse Practitioner [facility] for Infectious Disease [facility] Main number: [phone]

## 2020-03-23 ENCOUNTER — Telehealth: Payer: Self-pay

## 2020-03-23 NOTE — Telephone Encounter (Signed)
[facility] Patient Advocate Encounter  Received notification from [employer] that the request for prior authorization for CABENUVA has been denied due to PATIENT VIRAL LOAD.  This encounter will continue to be updated until final determination.    Kimberly Bishop, [facility] Patient [facility] for Infectious Disease Phone: [phone] Fax:  [phone]

## 2020-03-26 ENCOUNTER — Telehealth: Payer: Self-pay | Admitting: Pharmacist

## 2020-03-26 NOTE — Telephone Encounter (Signed)
Called patient to discuss starting [place]. Called home phone and female answered then hung up on me. Called cell phone listed and states it is no longer in service.

## 2020-03-27 ENCOUNTER — Telehealth: Payer: Self-pay | Admitting: Pharmacist

## 2020-03-27 ENCOUNTER — Encounter: Payer: Self-pay | Admitting: Pharmacist

## 2020-03-27 DIAGNOSIS — B20 Human immunodeficiency virus [HIV] disease: Secondary | ICD-10-CM

## 2020-03-27 MED ORDER — EDURANT 25 MG PO TABS: 25.0000 mg | ORAL_TABLET | Freq: Every day | ORAL | 0 refills | Status: DC

## 2020-03-27 MED ORDER — VOCABRIA 30 MG PO TABS: 1.0000 | ORAL_TABLET | Freq: Every day | ORAL | 0 refills | Status: DC

## 2020-03-27 NOTE — Telephone Encounter (Signed)
Spoke to patient regarding Cabenuva. She is still interested. She will come in next Friday 1/21 at 9am to pick up oral lead-in and schedule injections.

## 2020-04-01 ENCOUNTER — Telehealth: Payer: Self-pay

## 2020-04-01 NOTE — Telephone Encounter (Signed)
[facility] Patient Advocate Encounter  Patient's medications Armanda Heritage & Liborio Nixon) have been delivered to [facility] from [facility] and patient will pick up medication at her next appointment on 04/03/20.  [name] , [facility] Patient [facility] for Infectious Disease Phone: [phone] Fax:  [phone]

## 2020-04-03 ENCOUNTER — Encounter: Payer: Self-pay | Admitting: Pharmacist

## 2020-04-03 ENCOUNTER — Ambulatory Visit: Payer: Medicaid Other | Admitting: Pharmacist

## 2020-04-06 ENCOUNTER — Other Ambulatory Visit: Payer: Self-pay | Admitting: Pharmacist

## 2020-04-06 DIAGNOSIS — B20 Human immunodeficiency virus [HIV] disease: Secondary | ICD-10-CM

## 2020-05-19 ENCOUNTER — Ambulatory Visit: Payer: Medicaid Other | Admitting: Infectious Diseases

## 2020-08-21 ENCOUNTER — Ambulatory Visit (INDEPENDENT_AMBULATORY_CARE_PROVIDER_SITE_OTHER): Payer: Medicaid Other | Admitting: Infectious Diseases

## 2020-08-21 ENCOUNTER — Other Ambulatory Visit: Payer: Self-pay

## 2020-08-21 ENCOUNTER — Encounter: Payer: Self-pay | Admitting: Infectious Diseases

## 2020-08-21 VITALS — BP 132/83 | HR 91 | Temp 98.5°F | Wt 127.0 lb

## 2020-08-21 DIAGNOSIS — Z975 Presence of (intrauterine) contraceptive device: Secondary | ICD-10-CM | POA: Diagnosis not present

## 2020-08-21 DIAGNOSIS — R634 Abnormal weight loss: Secondary | ICD-10-CM | POA: Diagnosis not present

## 2020-08-21 DIAGNOSIS — B20 Human immunodeficiency virus [HIV] disease: Secondary | ICD-10-CM

## 2020-08-21 MED ORDER — MIRTAZAPINE 15 MG PO TABS: 15.0000 mg | ORAL_TABLET | Freq: Every day | ORAL | 0 refills | Status: DC

## 2020-08-21 MED ORDER — TIVICAY 50 MG PO TABS: 50.0000 mg | ORAL_TABLET | Freq: Every day | ORAL | 5 refills | Status: DC

## 2020-08-21 MED ORDER — EMTRICITABINE-TENOFOVIR AF 200-25 MG PO TABS: 1.0000 | ORAL_TABLET | Freq: Every day | ORAL | 5 refills | Status: DC

## 2020-08-21 MED ORDER — SULFAMETHOXAZOLE-TRIMETHOPRIM 400-80 MG PO TABS: 1.0000 | ORAL_TABLET | Freq: Every day | ORAL | 5 refills | Status: DC

## 2020-08-21 NOTE — Assessment & Plan Note (Signed)
We spent a lot of time discussing trouble she has had taking her medications. She wants to be healthy and is willing to be considered for long-acting adherence study we have through [facility]. I will draw a genotype with integrase resistance profile to help determine candidacy. She met with Maudie Mercury today and has an appt back to review in about 2-3 weeks.   In the mean time she is OK resuming previous regimen of Tivicay + Descovy (will do descovy since smaller pill). I explained to take these together everyday with or without food based on her preference.

## 2020-08-21 NOTE — Patient Instructions (Addendum)
Please stop your BIKTARVY   START both of these medications once a day at the same time TOGETHER  TIVICAY (small yellow circle pill)  DESCOVY (small blue rectangle pill)   START BACTRIM once a day - this is your antibiotic to protect your immune system until you recover.   If you want to try something for your appetite I will send in a medication called MIRTAZAPINE This is a a small pill taken once a night that typically helps with appetite   For your Nexplanon - Arrie Senate, MD was the doctor that put it in.  [place] for [facility] Address: [address] [place], [place] [address] Phone: [phone]

## 2020-08-21 NOTE — Assessment & Plan Note (Signed)
No findings c/w opportunistic infection today.  Will resume bactrim for PJP prophylaxis.

## 2020-08-21 NOTE — Progress Notes (Signed)
Name: KIMBERLYNN Bishop  DOB: 1990/05/13 MRN: [id number] PCP: Patient, No Pcp Per (Inactive)  OB/GYN: [name], MD   Brief Narrative:  Kimberly Bishop is a 30 y.o. female with HIV disease. CD4 nadir 90, likely less unrecorded VL: she has never been undetectable HIV Risk: Perinatal History of OIs: Intake Labs 2013: Hep B sAg (-), sAb (-), cAb (-); Hep A (-), Hep C (-) Quantiferon () HLA B*5701 (-) G6PD: ()   Previous Regimens: Tivicay + Odefsey Biktarvy Tivicay + Truvada (pregnancy 10-2018)   Genotypes: 05/2018: wildtype virus  02/18/19: no significant mutations including integrase     Subjective:   Chief Complaint  Patient presents with   Follow-up    Follow upn , back pain , not able eat , notice weight loss of 15-20lbs , no take medication       HPI: [name] is here with her 75 month old daughter. She has been off her Biktarvy for a while - she forgets to take it and has significant trouble with nausea associated with it. She did not have the nausea with previous regimen of Tivicay + Truvada that we gave her while she was pregnant last year.   She has noticed increased back pain, specifically on the left side. There has been no known injury to her back. She is wondering if it has anything to do with her Nexplanon contraception - she specifically has noticed this pain since that was placed.   Concern over poor appetite, weight loss (15-20 lbs by her scale). No rashes, fevers, sweating, chills. Eats about 1 time a day. Mostly just has no desire to eat.  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    Wt Readings from Last 3 Encounters:  08/21/20 127 lb (57.6 kg)  09/23/19 139 lb (63 kg)  08/26/19 139 lb (63 kg)     Review of Systems  Constitutional:  Positive for malaise/fatigue and weight loss. Negative for chills and fever.  HENT:  Negative for tinnitus.   Eyes:  Negative for blurred vision and photophobia.  Respiratory:  Negative  for cough and sputum production.   Cardiovascular:  Negative for chest pain.  Gastrointestinal:  Negative for diarrhea, nausea and vomiting.  Genitourinary:  Negative for dysuria.  Musculoskeletal:  Positive for back pain.  Skin:  Negative for rash.  Neurological:  Negative for weakness and headaches.  Psychiatric/Behavioral:  Negative for depression. The patient is not nervous/anxious and does not have insomnia.   All other systems reviewed and are negative.    Past Medical History:  Diagnosis Date   AIDS (acquired immunodeficiency syndrome), CD4 <=200 ([place])    History of kidney stones    HIV (human immunodeficiency virus infection) ([place])    Hypertension    Immune deficiency disorder ([place])    Infection    UTI   Kidney stones     Outpatient Medications Prior to Visit  Medication Sig Dispense Refill   Blood Pressure Monitoring DEVI 1 Device by Does not apply route once a week. ICD 10: O09.90 1 Device 0   enalapril (VASOTEC) 5 MG tablet Take 5 mg by mouth daily.     Cabotegravir Sodium (VOCABRIA) 30 MG TABS Take 1 tablet by mouth daily. 30 tablet 0   Prenatal Vit-Fe Fumarate-FA (PRENATAL VITAMINS) 28-0.8 MG TABS Take 1 tablet by mouth daily.     rilpivirine (EDURANT) 25 MG TABS tablet Take 1 tablet (25 mg total) by mouth daily  with breakfast. 30 tablet 0   bictegravir-emtricitabine-tenofovir AF (BIKTARVY) 50-200-25 MG TABS tablet Take 1 tablet by mouth daily. (Patient not taking: Reported on 08/21/2020) 30 tablet 5   misoprostol (CYTOTEC) 200 MCG tablet Take 5 tablets (1,000 mcg total) by mouth once for 1 dose. 4 tablet 0   No facility-administered medications prior to visit.     No Known Allergies  Social History   Tobacco Use   Smoking status: Never   Smokeless tobacco: Never  Vaping Use   Vaping Use: Never used  Substance Use Topics   Alcohol use: Not Currently    Comment: occasional -    Drug use: Not Currently    Types: Marijuana    Comment: last use 08/24/18     Social History   Substance and Sexual Activity  Sexual Activity Yes   Partners: Male   Birth control/protection: None     Objective:   Vitals:   08/21/20 1113  BP: 132/83  Pulse: 91  Temp: 98.5 F (36.9 C)  SpO2: 99%  Weight: 127 lb (57.6 kg)   Body mass index is 23.23 kg/m.  Physical Exam Vitals reviewed.  Constitutional:      Appearance: She is well-developed.     Comments: Seated comfortably in chair.   HENT:     Mouth/Throat:     Mouth: No oral lesions.     Dentition: Normal dentition. No dental abscesses.     Pharynx: No oropharyngeal exudate.  Cardiovascular:     Rate and Rhythm: Normal rate and regular rhythm.     Heart sounds: Normal heart sounds.  Pulmonary:     Effort: Pulmonary effort is normal.     Breath sounds: Normal breath sounds.  Abdominal:     General: There is no distension.     Palpations: Abdomen is soft.     Tenderness: There is no abdominal tenderness.  Lymphadenopathy:     Cervical: No cervical adenopathy.  Skin:    General: Skin is warm and dry.     Findings: No rash.  Neurological:     Mental Status: She is alert and oriented to person, place, and time.  Psychiatric:        Judgment: Judgment normal.    Lab Results Lab Results  Component Value Date   WBC 7.2 09/23/2019   HGB 10.7 (L) 09/23/2019   HCT 33.5 (L) 09/23/2019   MCV 81.5 09/23/2019   PLT 292 09/23/2019    Lab Results  Component Value Date   CREATININE 0.92 03/29/2019   BUN 12 03/29/2019   NA 134 (L) 03/29/2019   K 4.6 03/29/2019   CL 106 03/29/2019   CO2 19 (L) 03/29/2019    Lab Results  Component Value Date   ALT 29 08/21/2019   AST 21 08/21/2019   ALKPHOS 183 (H) 03/29/2019   BILITOT 0.4 08/21/2019    Lab Results  Component Value Date   CHOL 177 03/28/2017   HDL 44 (L) 03/28/2017   LDLCALC 119 (H) 03/28/2017   TRIG 58 03/28/2017   CHOLHDL 4.0 03/28/2017   HIV 1 RNA Quant (copies/mL)  Date Value  03/26/2019 136 (H)  02/18/2019 1,480  (H)  02/04/2019 4,750 (H)   CD4 T Cell Abs (/uL)  Date Value  08/21/2019 67 (L)  02/04/2019 68 (L)  10/03/2018 94 (L)     Assessment & Plan:   Problem List Items Addressed This Visit       Unprioritized   HIV (human immunodeficiency virus  infection) ([place]) - Primary (Chronic)    We spent a lot of time discussing trouble she has had taking her medications. She wants to be healthy and is willing to be considered for long-acting adherence study we have through [facility]. I will draw a genotype with integrase resistance profile to help determine candidacy. She met with Maudie Mercury today and has an appt back to review in about 2-3 weeks.   In the mean time she is OK resuming previous regimen of Tivicay + Descovy (will do descovy since smaller pill). I explained to take these together everyday with or without food based on her preference.        Relevant Medications   dolutegravir (TIVICAY) 50 MG tablet   emtricitabine-tenofovir AF (DESCOVY) 200-25 MG tablet   sulfamethoxazole-trimethoprim (BACTRIM) 400-80 MG tablet   Other Relevant Orders   HIV RNA, RTPCR W/R GT (RTI, PI,INT)   COMPLETE METABOLIC PANEL WITH GFR   Weight loss    We discussed adding an appetite stimulant today - will do mirtazapine at night to see if this helps.        Relevant Medications   mirtazapine (REMERON) 15 MG tablet   Nexplanon in place    Per UpToDate 7% of patients can experience back pain with nexplanon implant. I gave her the information to discuss further with the prescribing doctor so they can discuss removing it and other contraception options. She is not interested in family planning at this time.        AIDS (acquired immunodeficiency syndrome), CD4 <=200 (H[facility])    No findings c/w opportunistic infection today.  Will resume bactrim for PJP prophylaxis.        Relevant Medications   dolutegravir (TIVICAY) 50 MG tablet   emtricitabine-tenofovir AF (DESCOVY) 200-25 MG tablet    sulfamethoxazole-trimethoprim (BACTRIM) 400-80 MG tablet    Janene Madeira, MSN, NP-C [facility] [place] Pager: [phone] Office: [phone]  08/21/20  2:16 PM

## 2020-08-21 NOTE — Assessment & Plan Note (Signed)
We discussed adding an appetite stimulant today - will do mirtazapine at night to see if this helps.

## 2020-08-21 NOTE — Assessment & Plan Note (Signed)
Per UpToDate 7% of patients can experience back pain with nexplanon implant. I gave her the information to discuss further with the prescribing doctor so they can discuss removing it and other contraception options. She is not interested in family planning at this time.

## 2020-08-24 NOTE — Telephone Encounter (Signed)
Patient sent mychart message with complaints of vomiting since medication changes. Routing to NP and Pharmacy to determine if nausea medication is appropriate.

## 2020-08-25 ENCOUNTER — Other Ambulatory Visit: Payer: Self-pay | Admitting: Infectious Diseases

## 2020-08-25 DIAGNOSIS — R112 Nausea with vomiting, unspecified: Secondary | ICD-10-CM

## 2020-08-25 MED ORDER — PROMETHAZINE HCL 25 MG PO TABS: 12.5000 mg | ORAL_TABLET | Freq: Four times a day (QID) | ORAL | 1 refills | Status: DC | PRN

## 2020-09-03 ENCOUNTER — Other Ambulatory Visit: Payer: Self-pay | Admitting: Infectious Diseases

## 2020-09-03 ENCOUNTER — Encounter: Payer: Medicaid Other | Admitting: *Deleted

## 2020-09-03 ENCOUNTER — Other Ambulatory Visit (HOSPITAL_COMMUNITY): Payer: Self-pay

## 2020-09-03 DIAGNOSIS — R634 Abnormal weight loss: Secondary | ICD-10-CM

## 2020-09-03 DIAGNOSIS — B20 Human immunodeficiency virus [HIV] disease: Secondary | ICD-10-CM

## 2020-09-03 DIAGNOSIS — R112 Nausea with vomiting, unspecified: Secondary | ICD-10-CM

## 2020-09-03 MED ORDER — SULFAMETHOXAZOLE-TRIMETHOPRIM 400-80 MG PO TABS
1.0000 | ORAL_TABLET | Freq: Every day | ORAL | 5 refills | Status: DC
  Filled 2020-09-03: qty 30, 30d supply, fill #0

## 2020-09-03 MED ORDER — PROMETHAZINE HCL 25 MG PO TABS
12.5000 mg | ORAL_TABLET | Freq: Four times a day (QID) | ORAL | 1 refills | Status: DC | PRN
  Filled 2020-09-03: qty 30, 8d supply, fill #0

## 2020-09-03 MED ORDER — EMTRICITABINE-TENOFOVIR AF 200-25 MG PO TABS
1.0000 | ORAL_TABLET | Freq: Every day | ORAL | 5 refills | Status: DC
  Filled 2020-09-03 – 2020-10-01 (×2): qty 30, 30d supply, fill #0

## 2020-09-03 MED ORDER — TIVICAY 50 MG PO TABS
50.0000 mg | ORAL_TABLET | Freq: Every day | ORAL | 5 refills | Status: DC
  Filled 2020-09-03: qty 30, 30d supply, fill #0
  Filled 2020-10-01: qty 30, 30d supply, fill #1

## 2020-09-03 MED ORDER — MIRTAZAPINE 15 MG PO TABS
15.0000 mg | ORAL_TABLET | Freq: Every day | ORAL | 0 refills | Status: DC
  Filled 2020-09-03: qty 30, 30d supply, fill #0

## 2020-09-03 NOTE — Progress Notes (Signed)
Patient requesting mailed rx's to her home. Will change rx to go to [facility] to help facilitate this and keep good communication with ID team.

## 2020-09-03 NOTE — Telephone Encounter (Signed)
Yes I can get everything mailed out today as long as the scripts are there before 3pm .

## 2020-09-04 ENCOUNTER — Other Ambulatory Visit (HOSPITAL_COMMUNITY): Payer: Self-pay

## 2020-09-05 LAB — COMPLETE METABOLIC PANEL WITH GFR
AG Ratio: 1.1 (calc) (ref 1.0–2.5)
ALT: 22 U/L (ref 6–29)
AST: 23 U/L (ref 10–30)
Albumin: 4.2 g/dL (ref 3.6–5.1)
Alkaline phosphatase (APISO): 205 U/L — ABNORMAL HIGH (ref 31–125)
BUN: 8 mg/dL (ref 7–25)
CO2: 23 mmol/L (ref 20–32)
Calcium: 9.3 mg/dL (ref 8.6–10.2)
Chloride: 107 mmol/L (ref 98–110)
Creat: 0.75 mg/dL (ref 0.50–1.10)
GFR, Est African American: 125 mL/min/{1.73_m2} (ref >=60)
GFR, Est Non African American: 108 mL/min/{1.73_m2} (ref >=60)
Globulin: 3.9 g/dL (calc) — ABNORMAL HIGH (ref 1.9–3.7)
Glucose, Bld: 80 mg/dL (ref 65–99)
Potassium: 4.1 mmol/L (ref 3.5–5.3)
Sodium: 139 mmol/L (ref 135–146)
Total Bilirubin: 0.4 mg/dL (ref 0.2–1.2)
Total Protein: 8.1 g/dL (ref 6.1–8.1)

## 2020-09-05 LAB — HIV-1 INTEGRASE GENOTYPE

## 2020-09-05 LAB — HIV RNA, RTPCR W/R GT (RTI, PI,INT)
HIV 1 RNA Quant: 27600 copies/mL — ABNORMAL HIGH
HIV-1 RNA Quant, Log: 4.44 Log copies/mL — ABNORMAL HIGH

## 2020-09-05 LAB — HIV-1 GENOTYPE: HIV-1 Genotype: DETECTED — AB

## 2020-09-10 ENCOUNTER — Encounter: Payer: Medicaid Other | Admitting: *Deleted

## 2020-09-10 ENCOUNTER — Other Ambulatory Visit: Payer: Self-pay

## 2020-09-10 VITALS — BP 119/76 | HR 87 | Temp 98.6°F | Ht 62.0 in | Wt 125.1 lb

## 2020-09-10 DIAGNOSIS — Z006 Encounter for examination for normal comparison and control in clinical research program: Secondary | ICD-10-CM

## 2020-09-11 LAB — COMPREHENSIVE METABOLIC PANEL
AG Ratio: 0.9 (calc) — ABNORMAL LOW (ref 1.0–2.5)
ALT: 18 U/L (ref 6–29)
AST: 18 U/L (ref 10–30)
Albumin: 4.2 g/dL (ref 3.6–5.1)
Alkaline phosphatase (APISO): 236 U/L — ABNORMAL HIGH (ref 31–125)
BUN: 9 mg/dL (ref 7–25)
CO2: 25 mmol/L (ref 20–32)
Calcium: 9.7 mg/dL (ref 8.6–10.2)
Chloride: 105 mmol/L (ref 98–110)
Creat: 0.99 mg/dL (ref 0.50–1.10)
Globulin: 4.5 g/dL (calc) — ABNORMAL HIGH (ref 1.9–3.7)
Glucose, Bld: 89 mg/dL (ref 65–99)
Potassium: 4 mmol/L (ref 3.5–5.3)
Sodium: 140 mmol/L (ref 135–146)
Total Bilirubin: 0.5 mg/dL (ref 0.2–1.2)
Total Protein: 8.7 g/dL — ABNORMAL HIGH (ref 6.1–8.1)

## 2020-09-11 LAB — URINALYSIS, ROUTINE W REFLEX MICROSCOPIC
Bilirubin Urine: NEGATIVE
Glucose, UA: NEGATIVE
Ketones, ur: NEGATIVE
Nitrite: POSITIVE — AB
RBC / HPF: NONE SEEN /HPF (ref 0–2)
Specific Gravity, Urine: 1.01 (ref 1.001–1.035)
pH: 7.5 (ref 5.0–8.0)

## 2020-09-11 LAB — FOLLICLE STIMULATING HORMONE: FSH: 3.9 m[IU]/mL

## 2020-09-11 LAB — HEPATITIS C ANTIBODY
Hepatitis C Ab: NONREACTIVE
SIGNAL TO CUT-OFF: 0.07 (ref <1.00)

## 2020-09-11 LAB — HEPATITIS B SURFACE ANTIGEN: Hepatitis B Surface Ag: NONREACTIVE

## 2020-09-11 LAB — HCG, SERUM, QUALITATIVE: Preg, Serum: NEGATIVE

## 2020-09-11 LAB — BILIRUBIN, DIRECT: Bilirubin, Direct: 0.1 mg/dL (ref 0.0–0.2)

## 2020-09-11 LAB — HEPATITIS B SURFACE ANTIBODY,QUALITATIVE: Hep B S Ab: NONREACTIVE

## 2020-09-11 LAB — HEPATITIS B CORE ANTIBODY, TOTAL: Hep B Core Total Ab: NONREACTIVE

## 2020-09-11 LAB — MICROSCOPIC MESSAGE

## 2020-09-11 NOTE — Research (Signed)
Shaia came in today to screen for the A5359 study. Informed consent was obtained after she had reviewed the consent at home prior to the visit and we reviewed it together during the visit. She understands that she must get her viral load suppressed to under 200 before she can move into the next part of the study and have the chance of starting cabaneuva. She has a hard time taking her meds and had not taken any since her child was born January 2021. She said they make her feel nauseated no matter what she takes. She had also had many missed visits over the past year and half and had not been in to the clinic since before she delivered.  She contracted HIV from her mother during childbirth.  She has been on many different [facility] over the years and doesn't remember all of them. She is excited about having the chance to start injections and motivated. Her entry visit is scheduled in 3 weeks.

## 2020-09-17 ENCOUNTER — Ambulatory Visit: Payer: Medicaid Other | Admitting: Infectious Diseases

## 2020-09-29 ENCOUNTER — Other Ambulatory Visit (HOSPITAL_COMMUNITY): Payer: Self-pay

## 2020-09-30 LAB — CD4/CD8 (T-HELPER/T-SUPPRESSOR CELL)
CD4 % Helper T Cell: 5.3
CD4 Count: 48
CD8 % Suppressor T Cell: 50
CD8 T Cell Abs: 450

## 2020-10-01 ENCOUNTER — Other Ambulatory Visit (HOSPITAL_COMMUNITY): Payer: Self-pay

## 2020-10-01 ENCOUNTER — Encounter: Payer: Medicaid Other | Admitting: *Deleted

## 2020-10-08 ENCOUNTER — Encounter (INDEPENDENT_AMBULATORY_CARE_PROVIDER_SITE_OTHER): Payer: Self-pay | Admitting: *Deleted

## 2020-10-08 ENCOUNTER — Other Ambulatory Visit (HOSPITAL_COMMUNITY): Payer: Self-pay

## 2020-10-08 ENCOUNTER — Other Ambulatory Visit: Payer: Self-pay | Admitting: *Deleted

## 2020-10-08 ENCOUNTER — Encounter: Payer: Self-pay | Admitting: *Deleted

## 2020-10-08 ENCOUNTER — Other Ambulatory Visit: Payer: Self-pay

## 2020-10-08 VITALS — BP 127/85 | HR 103 | Temp 99.9°F | Wt 127.4 lb

## 2020-10-08 DIAGNOSIS — Z006 Encounter for examination for normal comparison and control in clinical research program: Secondary | ICD-10-CM

## 2020-10-08 DIAGNOSIS — R350 Frequency of micturition: Secondary | ICD-10-CM

## 2020-10-08 LAB — POCT URINE PREGNANCY: Preg Test, Ur: NEGATIVE

## 2020-10-08 MED ORDER — CIPROFLOXACIN HCL 500 MG PO TABS
500.0000 mg | ORAL_TABLET | Freq: Two times a day (BID) | ORAL | 0 refills | Status: AC
  Filled 2020-10-08: qty 10, 5d supply, fill #0

## 2020-10-08 MED ORDER — NITROFURANTOIN MONOHYD MACRO 100 MG PO CAPS: 100.0000 mg | ORAL_CAPSULE | Freq: Two times a day (BID) | ORAL | 0 refills | Status: DC

## 2020-10-08 MED ORDER — NITROFURANTOIN MONOHYD MACRO 100 MG PO CAPS
100.0000 mg | ORAL_CAPSULE | Freq: Two times a day (BID) | ORAL | 0 refills | Status: DC
  Filled 2020-10-08: qty 10, 5d supply, fill #0

## 2020-10-08 NOTE — Progress Notes (Signed)
Subjective:    Patient ID: Kimberly Bishop, female    DOB: [birth year], 30 y.o.   MRN: [id number]  Chief Complaint  Patient presents with   Research    [phone]    Urinary Frequency    Left back pain, hx of recurrent UTI and nephrolithiasis     HPI:  Kimberly Bishop is a 30 y.o. female who presents today for Entry into A5359 "The [place]ATTITUDE Study: Long Acting Therapy to Improve Treatment Success in Daily Life-a  Phase III study to Evaluate long acting ART in non adherent HIV infected individuals. She reports today left sided intermittent flank pain described as an ache that began 2 days ago.  No history of injury or trauma. Pain is not present currently but states "the pain feels like it is moving down similar to my previous kidney stones." Also endorses urinary frequency, urgency, and suprapubic "pressure." She denies hematuria, dysuria, vaginal discharge, fever, chills, N/V/D.  Contraception is Nexplanon.  She is in a monogamous relationship with long term partner, no previous history of STI. She acquired HIV perinatally from her mother. She has a history of nephrolithiasis with lithotripsy and recurrent UTIs.     No Known Allergies    Outpatient Medications Prior to Visit  Medication Sig Dispense Refill   Blood Pressure Monitoring DEVI 1 Device by Does not apply route once a week. ICD 10: O09.90 1 Device 0   dolutegravir (TIVICAY) 50 MG tablet Take 1 tablet (50 mg total) by mouth daily. 30 tablet 5   emtricitabine-tenofovir AF (DESCOVY) 200-25 MG tablet Take 1 tablet by mouth daily. 30 tablet 5   mirtazapine (REMERON) 15 MG tablet Take 1 tablet (15 mg total) by mouth at bedtime. 30 tablet 0   promethazine (PHENERGAN) 25 MG tablet Take 0.5-1 tablets (12.5-25 mg total) by mouth every 6 (six) hours as needed for nausea or vomiting. 30 tablet 1   sulfamethoxazole-trimethoprim (BACTRIM) 400-80 MG tablet Take 1 tablet by mouth daily. 30 tablet 5   No facility-administered  medications prior to visit.     Past Medical History:  Diagnosis Date   AIDS (acquired immunodeficiency syndrome), CD4 <=200 ([place])    History of kidney stones    HIV (human immunodeficiency virus infection) ([place])    Hypertension    Immune deficiency disorder ([place])    Infection    UTI   Kidney stones      Past Surgical History:  Procedure Laterality Date   APPENDECTOMY  2011   CESAREAN SECTION N/A 03/29/2019   Procedure: CESAREAN SECTION;  Surgeon: Jonnie Kind, MD;  Location: [facility] LD ORS;  Service: Obstetrics;  Laterality: N/A;   CYSTOSCOPY W/ URETERAL STENT PLACEMENT Right 11/03/2016   Procedure: CYSTOSCOPY WITH RETROGRADE PYELOGRAM/URETERAL STENT PLACEMENT;  Surgeon: Kathie Rhodes, MD;  Location: [facility] ORS;  Service: Urology;  Laterality: Right;   CYSTOSCOPY/URETEROSCOPY/HOLMIUM LASER/STENT PLACEMENT Right 01/16/2017   Procedure: CYSTOSCOPY/URETEROSCOPY/ STONE EXTRACTION/STENT removal;  Surgeon: Kathie Rhodes, MD;  Location: [facility] ORS;  Service: Urology;  Laterality: Right;   IR DIL URETER RIGHT  12/05/2016   IR NEPHROSTOMY PLACEMENT RIGHT  11/07/2016   IR URETERAL STENT PLACEMENT EXISTING ACCESS RIGHT  12/05/2016   NEPHROLITHOTOMY Right 12/05/2016   Procedure: NEPHROLITHOTOMY PERCUTANEOUS;  Surgeon: Kathie Rhodes, MD;  Location: [facility] ORS;  Service: Urology;  Laterality: Right;       Review of Systems  Constitutional:  Negative for chills, diaphoresis, fatigue and fever.  Gastrointestinal:  Positive for abdominal pain. Negative for abdominal distention,  diarrhea, nausea and vomiting.  Genitourinary:  Positive for flank pain, frequency and urgency. Negative for decreased urine volume, dyspareunia, dysuria, enuresis, genital sores, hematuria, menstrual problem, pelvic pain, vaginal bleeding, vaginal discharge and vaginal pain.  Musculoskeletal:  Negative for back pain.     Objective:    There were no vitals taken for this visit. Nursing note and vital signs reviewed.  Physical  Exam Constitutional:      Appearance: Normal appearance. She is normal weight.  HENT:     Head: Normocephalic and atraumatic.  Eyes:     Extraocular Movements: Extraocular movements intact.     Conjunctiva/sclera: Conjunctivae normal.     Pupils: Pupils are equal, round, and reactive to light.  Cardiovascular:     Rate and Rhythm: Normal rate and regular rhythm.  Pulmonary:     Effort: Pulmonary effort is normal.     Breath sounds: Normal breath sounds.  Abdominal:     General: Abdomen is flat. Bowel sounds are normal.     Palpations: Abdomen is soft.     Tenderness: There is abdominal tenderness (suprapubic tenderness).  Neurological:     Mental Status: She is alert.  Psychiatric:        Mood and Affect: Mood normal.        Behavior: Behavior normal.     Depression screen [facility] 2/9 08/21/2020 03/11/2020 10/11/2019 09/11/2019 08/26/2019  Decreased Interest 0 0 0 0 0  Down, Depressed, Hopeless 0 0 0 0 0  PHQ - 2 Score 0 0 0 0 0  Altered sleeping - - 0 0 0  Tired, decreased energy - - 0 0 0  Change in appetite - - 0 0 0  Feeling bad or failure about yourself  - - 0 0 0  Trouble concentrating - - 0 0 0  Moving slowly or fidgety/restless - - 0 0 0  Suicidal thoughts - - 0 0 0  PHQ-9 Score - - 0 0 0  Some recent data might be hidden       Assessment & Plan:  Entry to [id number]  Urinary Frequency-sent UA with micro and culture, will start macrobid 100 mg bid x 5 days for simple cystitis empiric treatment, encouraged to drink plenty of water and if signs or symptoms change or worsen she need to seek medical attention.      Patient Active Problem List   Diagnosis Date Noted   Weight loss 08/21/2020   Nexplanon in place 10/11/2019   AIDS (acquired immunodeficiency syndrome), CD4 <=200 ([place])    HIV (human immunodeficiency virus infection) ([place]) 03/29/2019   CIN I (cervical intraepithelial neoplasia I) 11/28/2018   ASCUS with positive high risk HPV cervical 11/26/2018   Encounter  for long-term (current) use of high-risk medication 03/28/2017   Molluscum contagiosum 03/22/2016     Problem List Items Addressed This Visit   None Visit Diagnoses     Examination of participant in clinical trial    -  Primary   Relevant Orders   POCT urine pregnancy (Completed)   HIV 1 RNA quant-no reflex-bld   Lipid panel   Hepatic function panel   Urinalysis, Routine w/reflex microscopic   Urinary frequency       Relevant Medications   nitrofurantoin, macrocrystal-monohydrate, (MACROBID) 100 MG capsule   Other Relevant Orders   Urinalysis w microscopic + reflex cultur        I am having Shatira C. Rodanthe start on nitrofurantoin (macrocrystal-monohydrate). I am also having her maintain her  Blood Pressure Monitoring, emtricitabine-tenofovir AF, sulfamethoxazole-trimethoprim, promethazine, mirtazapine, and Tivicay.   Meds ordered this encounter  Medications   nitrofurantoin, macrocrystal-monohydrate, (MACROBID) 100 MG capsule    Sig: Take 1 capsule (100 mg total) by mouth 2 (two) times daily for 5 days.    Dispense:  10 capsule    Refill:  0    Order Specific Question:   Supervising Provider    Answer:   VAN DAM, CORNELIUS N [id number]     Follow-up: No follow-ups on file.

## 2020-10-08 NOTE — Addendum Note (Signed)
Addended by: Bobbie Stack on: 10/08/2020 11:09 AM   Modules accepted: Orders

## 2020-10-08 NOTE — Research (Signed)
Tyasha was here today to enroll in the [place] study. A urine pregnancy test was performed before she was enrolled to confirm eligiblity, which was negative.  She did report some flank pain on the left side so Claiborne Billings assessed her. She has a hx of kidney stones and she said it felt a little like that. She also reported some frequency, but no burning or blood in urine. A urine was obtained for U/A and culture. She denies any other new problems or medications. She reports doing better at taking her meds, but does report nausea every morning after she takes them. We discussed adherence strategies and she does use an alarm on her phone, which helps. I recommended she use a calendar and mark off each day she takes them, so she can track whether she took them or not. She did say that her other meds make her nauseated also, like the bactrim and it doesn't matter if she takes it with food or not. We discussed focusing on the goal of getting suppressed and on to injectables for this part of the study, and to see it as a temporary inconvenience and also how much better she will feel once she is suppressed. She will be returning in 2 weeks

## 2020-10-08 NOTE — Addendum Note (Signed)
Addended by: Robert Bellow on: 10/08/2020 03:55 PM   Modules accepted: Orders

## 2020-10-08 NOTE — Patient Instructions (Signed)
Macrobid 100 mg twice daily x 5 days Seek emergency treatment if symptoms change or worsen with fever, chills, nausea, vomiting, blood in urine as discussed Increase water intake Tylenol for pain

## 2020-10-09 ENCOUNTER — Other Ambulatory Visit (HOSPITAL_COMMUNITY): Payer: Self-pay

## 2020-10-09 ENCOUNTER — Other Ambulatory Visit: Payer: Self-pay | Admitting: Infectious Diseases

## 2020-10-09 DIAGNOSIS — R509 Fever, unspecified: Secondary | ICD-10-CM

## 2020-10-10 LAB — URINALYSIS W MICROSCOPIC + REFLEX CULTURE
Bacteria, UA: NONE SEEN /HPF
Bilirubin Urine: NEGATIVE
Glucose, UA: NEGATIVE
Ketones, ur: NEGATIVE
Nitrites, Initial: NEGATIVE
Protein, ur: NEGATIVE
Specific Gravity, Urine: 1.007 (ref 1.001–1.035)
Squamous Epithelial / HPF: NONE SEEN /HPF (ref <=5)
pH: 7.5 (ref 5.0–8.0)

## 2020-10-10 LAB — URINE CULTURE
MICRO NUMBER:: 12177856
SPECIMEN QUALITY:: ADEQUATE

## 2020-10-10 LAB — CULTURE INDICATED

## 2020-10-12 ENCOUNTER — Ambulatory Visit: Payer: Medicaid Other | Admitting: Infectious Disease

## 2020-10-12 LAB — LIPID PANEL
Cholesterol: 182 mg/dL (ref <200)
HDL: 42 mg/dL — ABNORMAL LOW (ref >=50)
LDL Cholesterol (Calc): 122 mg/dL (calc) — ABNORMAL HIGH
Non-HDL Cholesterol (Calc): 140 mg/dL (calc) — ABNORMAL HIGH (ref <130)
Total CHOL/HDL Ratio: 4.3 (calc) (ref <5.0)
Triglycerides: 83 mg/dL (ref <150)

## 2020-10-12 LAB — HEPATIC FUNCTION PANEL
AG Ratio: 1 (calc) (ref 1.0–2.5)
ALT: 72 U/L — ABNORMAL HIGH (ref 6–29)
AST: 84 U/L — ABNORMAL HIGH (ref 10–30)
Albumin: 4.1 g/dL (ref 3.6–5.1)
Alkaline phosphatase (APISO): 265 U/L — ABNORMAL HIGH (ref 31–125)
Bilirubin, Direct: 0.1 mg/dL (ref 0.0–0.2)
Globulin: 4.1 g/dL (calc) — ABNORMAL HIGH (ref 1.9–3.7)
Indirect Bilirubin: 0.3 mg/dL (calc) (ref 0.2–1.2)
Total Bilirubin: 0.4 mg/dL (ref 0.2–1.2)
Total Protein: 8.2 g/dL — ABNORMAL HIGH (ref 6.1–8.1)

## 2020-10-12 LAB — HIV-1 RNA QUANT-NO REFLEX-BLD
HIV 1 RNA Quant: 14800 Copies/mL — ABNORMAL HIGH
HIV-1 RNA Quant, Log: 4.17 Log cps/mL — ABNORMAL HIGH

## 2020-10-13 ENCOUNTER — Encounter: Payer: Self-pay | Admitting: Infectious Disease

## 2020-10-13 ENCOUNTER — Other Ambulatory Visit (HOSPITAL_COMMUNITY)
Admission: RE | Admit: 2020-10-13 | Discharge: 2020-10-13 | Disposition: A | Discharge status: Home / Self Care | Payer: Medicaid Other | Source: Ambulatory Visit | Attending: Infectious Disease | Admitting: Infectious Disease

## 2020-10-13 ENCOUNTER — Other Ambulatory Visit: Payer: Self-pay

## 2020-10-13 ENCOUNTER — Ambulatory Visit (INDEPENDENT_AMBULATORY_CARE_PROVIDER_SITE_OTHER): Payer: Medicaid Other | Admitting: Infectious Disease

## 2020-10-13 VITALS — BP 142/91 | HR 73 | Temp 97.8°F | Wt 126.0 lb

## 2020-10-13 DIAGNOSIS — Z975 Presence of (intrauterine) contraceptive device: Secondary | ICD-10-CM

## 2020-10-13 DIAGNOSIS — N870 Mild cervical dysplasia: Secondary | ICD-10-CM | POA: Diagnosis not present

## 2020-10-13 DIAGNOSIS — R7401 Elevation of levels of liver transaminase levels: Secondary | ICD-10-CM | POA: Diagnosis not present

## 2020-10-13 DIAGNOSIS — R112 Nausea with vomiting, unspecified: Secondary | ICD-10-CM

## 2020-10-13 DIAGNOSIS — B20 Human immunodeficiency virus [HIV] disease: Secondary | ICD-10-CM | POA: Insufficient documentation

## 2020-10-13 DIAGNOSIS — N3000 Acute cystitis without hematuria: Secondary | ICD-10-CM

## 2020-10-13 DIAGNOSIS — Z79899 Other long term (current) drug therapy: Secondary | ICD-10-CM

## 2020-10-13 LAB — POCT URINE PREGNANCY: Preg Test, Ur: NEGATIVE

## 2020-10-13 MED ORDER — PROMETHAZINE HCL 25 MG PO TABS: 25.0000 mg | ORAL_TABLET | Freq: Four times a day (QID) | ORAL | 4 refills | Status: DC | PRN

## 2020-10-13 NOTE — Progress Notes (Signed)
Subjective:  Chief complaint: nausea and vomiting the morning after taking TIVICAY and DESCOVY  Patient ID: Kimberly Bishop, female    DOB: 11-07-1990, 30 y.o.   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  Unfortunately she has only been able to be adherent to medications while pregnant.  She now has an 30 year old girl  She is enrolled into LATTITUDE study and currently taking TIVICAY and DESCOVY.  She recently had some flank pain and an abnormal urine analysis with E. coli that grew from urine she was prescribed Macrobid and then ciprofloxacin.  She has had resolution of those symptoms.  Last labs checked here showed her to have new transaminitis.  She denies drinking alcohol the night before labs were done.  She continues to have trouble with vomiting she says that she takes TIVICAY and DESCOVY at night and the next morning wakes up throwing up.  She has been taking Phenergan but really only once a day have counseled her to take it an hour before she takes her [place] and DESCOVY and also before going to bed and if needed in the morning and that she can take it up to 4 times daily.    Past Medical History:  Diagnosis Date   AIDS (acquired immunodeficiency syndrome), CD4 <=200 ([place])    History of kidney stones    HIV (human immunodeficiency virus infection) ([place])    Hypertension    Immune deficiency disorder ([place])    Infection    UTI   Kidney stones     Past Surgical History:  Procedure Laterality Date   APPENDECTOMY  2011   CESAREAN SECTION N/A 03/29/2019   Procedure: CESAREAN SECTION;  Surgeon: Jonnie Kind, MD;  Location: [facility];  Service: Obstetrics;  Laterality: N/A;   CYSTOSCOPY W/ URETERAL STENT PLACEMENT Right 11/03/2016   Procedure: CYSTOSCOPY WITH RETROGRADE PYELOGRAM/URETERAL STENT PLACEMENT;  Surgeon: Kathie Rhodes, MD;  Location: [facility];  Service: Urology;  Laterality: Right;    CYSTOSCOPY/URETEROSCOPY/HOLMIUM LASER/STENT PLACEMENT Right 01/16/2017   Procedure: CYSTOSCOPY/URETEROSCOPY/ STONE EXTRACTION/STENT removal;  Surgeon: Kathie Rhodes, MD;  Location: [facility];  Service: Urology;  Laterality: Right;   IR DIL URETER RIGHT  12/05/2016   IR NEPHROSTOMY PLACEMENT RIGHT  11/07/2016   IR URETERAL STENT PLACEMENT EXISTING ACCESS RIGHT  12/05/2016   NEPHROLITHOTOMY Right 12/05/2016   Procedure: NEPHROLITHOTOMY PERCUTANEOUS;  Surgeon: Kathie Rhodes, MD;  Location: [facility];  Service: Urology;  Laterality: Right;    Family History  Adopted: Yes  Problem Relation Age of Onset   Hypertension Mother    Autism Brother       Social History   Socioeconomic History   Marital status: Single    Spouse name: Not on file   Number of children: Not on file   Years of education: Not on file   Highest education level: Not on file  Occupational History    Comment: unemployed  Tobacco Use   Smoking status: Never   Smokeless tobacco: Never  Vaping Use   Vaping Use: Never used  Substance and Sexual Activity   Alcohol use: Not Currently    Comment: occasional -    Drug use: Not Currently    Types: Marijuana    Comment: last use 08/24/18   Sexual activity: Yes    Partners: Male    Birth control/protection: None  Other Topics Concern   Not on file  Social History Narrative  Not on file   Social Determinants of Health   Financial Resource Strain: Not on file  Food Insecurity: Not on file  Transportation Needs: Not on file  Physical Activity: Not on file  Stress: Not on file  Social Connections: Not on file    No Known Allergies   Current Outpatient Medications:    Blood Pressure Monitoring DEVI, 1 Device by Does not apply route once a week. ICD 10: O09.90, Disp: 1 Device, Rfl: 0   dolutegravir (TIVICAY) 50 MG tablet, Take 1 tablet (50 mg total) by mouth daily., Disp: 30 tablet, Rfl: 5   emtricitabine-tenofovir AF (DESCOVY) 200-25 MG tablet, Take 1 tablet by mouth  daily., Disp: 30 tablet, Rfl: 5   mirtazapine (REMERON) 15 MG tablet, Take 1 tablet (15 mg total) by mouth at bedtime., Disp: 30 tablet, Rfl: 0   sulfamethoxazole-trimethoprim (BACTRIM) 400-80 MG tablet, Take 1 tablet by mouth daily., Disp: 30 tablet, Rfl: 5   ciprofloxacin (CIPRO) 500 MG tablet, Take 1 tablet (500 mg total) by mouth 2 (two) times daily for 5 days. (Patient not taking: Reported on 10/13/2020), Disp: 10 tablet, Rfl: 0   promethazine (PHENERGAN) 25 MG tablet, Take 1 tablet (25 mg total) by mouth every 6 (six) hours as needed for nausea or vomiting., Disp: 120 tablet, Rfl: 4   Review of Systems  Constitutional:  Negative for chills and fever.  HENT:  Negative for congestion and sore throat.   Eyes:  Negative for photophobia.  Respiratory:  Negative for cough, shortness of breath and wheezing.   Cardiovascular:  Negative for chest pain, palpitations and leg swelling.  Gastrointestinal:  Positive for abdominal pain, nausea and vomiting. Negative for blood in stool, constipation and diarrhea.  Genitourinary:  Negative for dysuria, flank pain and hematuria.  Musculoskeletal:  Negative for back pain and myalgias.  Skin:  Negative for rash.  Neurological:  Negative for dizziness, seizures, syncope, speech difficulty, weakness, light-headedness, numbness and headaches.  Hematological:  Does not bruise/bleed easily.  Psychiatric/Behavioral:  Negative for agitation, behavioral problems, confusion, self-injury, sleep disturbance and suicidal ideas.       Objective:   Physical Exam Constitutional:      General: She is not in acute distress.    Appearance: She is not diaphoretic.  HENT:     Head: Normocephalic and atraumatic.     Right Ear: External ear normal.     Left Ear: External ear normal.     Nose: Nose normal.     Mouth/Throat:     Pharynx: No oropharyngeal exudate.  Eyes:     General: No scleral icterus.       Right eye: No discharge.        Left eye: No discharge.      Extraocular Movements: Extraocular movements intact.     Conjunctiva/sclera: Conjunctivae normal.  Cardiovascular:     Rate and Rhythm: Normal rate and regular rhythm.     Heart sounds: No murmur heard.   No friction rub. No gallop.  Pulmonary:     Effort: Pulmonary effort is normal. No respiratory distress.     Breath sounds: No stridor. No wheezing or rales.  Abdominal:     General: There is no distension.     Palpations: Abdomen is soft. There is no mass.     Tenderness: There is no abdominal tenderness. There is no guarding or rebound.     Hernia: No hernia is present.  Musculoskeletal:        General:  No tenderness. Normal range of motion.     Cervical back: Normal range of motion and neck supple.  Lymphadenopathy:     Cervical: No cervical adenopathy.  Skin:    General: Skin is warm and dry.     Coloration: Skin is not jaundiced or pale.     Findings: No erythema, lesion or rash.  Neurological:     General: No focal deficit present.     Mental Status: She is alert and oriented to person, place, and time.     Coordination: Coordination normal.  Psychiatric:        Mood and Affect: Mood normal.        Behavior: Behavior normal.        Thought Content: Thought content normal.        Judgment: Judgment normal.          Assessment & Plan:   Nausea and vomiting: Not clear why this is happening.  She denies feeling anxious when she takes her antiretrovirals.  TIVICAY and DESCOVY are certainly not drugs that tend to cause nausea.  We will investigate this a bit further along with her work-up for her transaminitis.  I have ordered a CMP amylase lipase CBC with differential Epstein-Barr and CMV serologies and acute hepatitis panel serologies for Bartonella, toxoplasma RPR  AFB blood cultures  Am also ordering a CT with contrast of the abdomen and pelvis  With regards to her nausea have counseled her to take a full dose of Phenergan an hour before taking her [place] and also again at bedtime it seems her nausea and vomiting is largely the next morning when she wakes up so premedicated at bedtime would be helpful certainly taking it first thing in the morning also might be helpful.  She is not working currently and she denies that the Phenergan is making her sleepy.  I have told her she can take it up to 4 times daily.  Transaminitis: Not clear if the cause of this is as above checking several serologies along with CMP amylase lipase hopefully it is resolving.  HIV and AIDS: Critical that we can get her virus under control her regimen certainly should be much better tolerated than it seems to be.  She was able to take it while she was pregnant and had morning sickness as well.  I have scheduled her to come see me again in roughly 2 weeks time.  CIN: she was told she needed to be seen again after delivery. Will get her back in with [name] but perhaps she needs to see OB  Recent urinary tract infection: Resolved  I spent  67mnutes with the patient including  face to face counseling of the patient re her HIV, her past regimens, her patterns of nausea and vomiting, personally reviewing her liver function tests from July 28 which showed her to have an AST of 84 and ALT of 72 her viral load from October 08, 2020 which was 14,800 and her CD4 count which was 38 in review of medical records in preparation for the visit and during the visit and in coordination of her care.

## 2020-10-14 LAB — T-HELPER CELL (CD4) - (RCID CLINIC ONLY)
CD4 % Helper T Cell: 4 % — ABNORMAL LOW (ref 33–65)
CD4 T Cell Abs: 40 /uL — ABNORMAL LOW (ref 400–1790)

## 2020-10-14 LAB — URINE CYTOLOGY ANCILLARY ONLY
Chlamydia: NEGATIVE
Comment: NEGATIVE
Comment: NORMAL
Neisseria Gonorrhea: NEGATIVE

## 2020-10-22 ENCOUNTER — Encounter (INDEPENDENT_AMBULATORY_CARE_PROVIDER_SITE_OTHER): Payer: Self-pay | Admitting: *Deleted

## 2020-10-22 ENCOUNTER — Other Ambulatory Visit: Payer: Self-pay

## 2020-10-22 VITALS — BP 132/92 | HR 76 | Temp 98.3°F | Wt 123.5 lb

## 2020-10-22 DIAGNOSIS — Z006 Encounter for examination for normal comparison and control in clinical research program: Secondary | ICD-10-CM

## 2020-10-22 NOTE — Research (Signed)
Kimberly Bishop was here for her week 2 visit for A5359. She had taken 12 of the last 14 days worth of pills and was 85% adherent. I told her to congratulate herself on doing a good job! She is taking the phenergan at bedtime when she takes her ARVs and says her nausea has improved a little, but still there. Her flank pain has resolved and she continues to take the cipro started on 10/13/20 for UTI sx. She denies any new problems and will be returning in 2 weeks.

## 2020-10-29 ENCOUNTER — Ambulatory Visit: Payer: Medicaid Other | Admitting: Infectious Disease

## 2020-11-05 ENCOUNTER — Encounter: Payer: Medicaid Other | Admitting: *Deleted

## 2020-11-05 ENCOUNTER — Telehealth: Payer: Self-pay

## 2020-11-05 NOTE — Telephone Encounter (Signed)
[facility] Patient Advocate Encounter  [facility] and I have been unsuccsessful in reaching patient to be able to refill medication.    We have tried multiple times without a response.  Cortlynn Hollinsworth, CPhT Specialty Pharmacy Patient Advocate [facility] Phone: [phone] Fax:  [phone]  

## 2020-11-06 ENCOUNTER — Encounter: Payer: Medicaid Other | Admitting: *Deleted

## 2020-11-12 ENCOUNTER — Other Ambulatory Visit: Payer: Self-pay

## 2020-11-12 ENCOUNTER — Encounter (INDEPENDENT_AMBULATORY_CARE_PROVIDER_SITE_OTHER): Payer: Self-pay | Admitting: *Deleted

## 2020-11-12 DIAGNOSIS — Z006 Encounter for examination for normal comparison and control in clinical research program: Secondary | ICD-10-CM

## 2020-11-12 NOTE — Research (Signed)
Kimberly Bishop here today for her week 4 visit for A5359, the [place] study. No new complaints or medications. Excellent adherence with her study medication, 100% by pill count. Congratulated her on doing such a great job taking her medication. Continues to have some nausea but using the phenergan as needed. She is scheduled to return 12/03/20 for her next study visit.

## 2020-11-15 LAB — COMPREHENSIVE METABOLIC PANEL
AG Ratio: 1 (calc) (ref 1.0–2.5)
ALT: 12 U/L (ref 6–29)
AST: 14 U/L (ref 10–30)
Albumin: 4.3 g/dL (ref 3.6–5.1)
Alkaline phosphatase (APISO): 135 U/L — ABNORMAL HIGH (ref 31–125)
BUN: 8 mg/dL (ref 7–25)
CO2: 21 mmol/L (ref 20–32)
Calcium: 9.4 mg/dL (ref 8.6–10.2)
Chloride: 104 mmol/L (ref 98–110)
Creat: 0.82 mg/dL (ref 0.50–0.97)
Globulin: 4.1 g/dL (calc) — ABNORMAL HIGH (ref 1.9–3.7)
Glucose, Bld: 79 mg/dL (ref 65–99)
Potassium: 4 mmol/L (ref 3.5–5.3)
Sodium: 137 mmol/L (ref 135–146)
Total Bilirubin: 0.6 mg/dL (ref 0.2–1.2)
Total Protein: 8.4 g/dL — ABNORMAL HIGH (ref 6.1–8.1)

## 2020-11-15 LAB — CBC WITH DIFFERENTIAL/PLATELET
Absolute Monocytes: 396 cells/uL (ref 200–950)
Basophils Absolute: 18 cells/uL (ref 0–200)
Basophils Relative: 0.4 %
Eosinophils Absolute: 117 cells/uL (ref 15–500)
Eosinophils Relative: 2.6 %
HCT: 38.5 % (ref 35.0–45.0)
Hemoglobin: 12.3 g/dL (ref 11.7–15.5)
Lymphs Abs: 972 cells/uL (ref 850–3900)
MCH: 27.5 pg (ref 27.0–33.0)
MCHC: 31.9 g/dL — ABNORMAL LOW (ref 32.0–36.0)
MCV: 86.1 fL (ref 80.0–100.0)
MPV: 11.3 fL (ref 7.5–12.5)
Monocytes Relative: 8.8 %
Neutro Abs: 2997 cells/uL (ref 1500–7800)
Neutrophils Relative %: 66.6 %
Platelets: 352 10*3/uL (ref 140–400)
RBC: 4.47 10*6/uL (ref 3.80–5.10)
RDW: 15.8 % — ABNORMAL HIGH (ref 11.0–15.0)
Total Lymphocyte: 21.6 %
WBC: 4.5 10*3/uL (ref 3.8–10.8)

## 2020-11-15 LAB — HIV-1 RNA QUANT-NO REFLEX-BLD
HIV 1 RNA Quant: 39300 Copies/mL — ABNORMAL HIGH
HIV-1 RNA Quant, Log: 4.59 Log cps/mL — ABNORMAL HIGH

## 2020-11-15 LAB — HCG, SERUM, QUALITATIVE: Preg, Serum: NEGATIVE

## 2020-11-15 LAB — TEST AUTHORIZATION

## 2020-11-15 LAB — BILIRUBIN, DIRECT: Bilirubin, Direct: 0.1 mg/dL (ref 0.0–0.2)

## 2020-11-27 LAB — AFB CULTURE, BLOOD
MICRO NUMBER:: 12192245
SPECIMEN QUALITY:: ADEQUATE

## 2020-11-27 LAB — COMPLETE METABOLIC PANEL WITH GFR
AG Ratio: 1.1 (calc) (ref 1.0–2.5)
ALT: 42 U/L — ABNORMAL HIGH (ref 6–29)
AST: 30 U/L (ref 10–30)
Albumin: 4.4 g/dL (ref 3.6–5.1)
Alkaline phosphatase (APISO): 265 U/L — ABNORMAL HIGH (ref 31–125)
BUN/Creatinine Ratio: 14 (calc) (ref 6–22)
BUN: 14 mg/dL (ref 7–25)
CO2: 19 mmol/L — ABNORMAL LOW (ref 20–32)
Calcium: 9.6 mg/dL (ref 8.6–10.2)
Chloride: 107 mmol/L (ref 98–110)
Creat: 0.98 mg/dL — ABNORMAL HIGH (ref 0.50–0.96)
Globulin: 4 g/dL (calc) — ABNORMAL HIGH (ref 1.9–3.7)
Glucose, Bld: 83 mg/dL (ref 65–99)
Potassium: 3.8 mmol/L (ref 3.5–5.3)
Sodium: 139 mmol/L (ref 135–146)
Total Bilirubin: 0.3 mg/dL (ref 0.2–1.2)
Total Protein: 8.4 g/dL — ABNORMAL HIGH (ref 6.1–8.1)
eGFR: 80 mL/min/{1.73_m2} (ref >=60)

## 2020-11-27 LAB — HIV-1 INTEGRASE GENOTYPE

## 2020-11-27 LAB — TOXOPLASMA ANTIBODIES- IGG AND  IGM
Toxoplasma Antibody- IgM: <8 AU/mL
Toxoplasma IgG Ratio: <7.2 IU/mL

## 2020-11-27 LAB — CBC WITH DIFFERENTIAL/PLATELET
Absolute Monocytes: 270 cells/uL (ref 200–950)
Basophils Absolute: 20 cells/uL (ref 0–200)
Basophils Relative: 0.4 %
Eosinophils Absolute: 220 cells/uL (ref 15–500)
Eosinophils Relative: 4.4 %
HCT: 33.6 % — ABNORMAL LOW (ref 35.0–45.0)
Hemoglobin: 10.7 g/dL — ABNORMAL LOW (ref 11.7–15.5)
Lymphs Abs: 1070 cells/uL (ref 850–3900)
MCH: 27.1 pg (ref 27.0–33.0)
MCHC: 31.8 g/dL — ABNORMAL LOW (ref 32.0–36.0)
MCV: 85.1 fL (ref 80.0–100.0)
MPV: 11.2 fL (ref 7.5–12.5)
Monocytes Relative: 5.4 %
Neutro Abs: 3420 cells/uL (ref 1500–7800)
Neutrophils Relative %: 68.4 %
Platelets: 432 10*3/uL — ABNORMAL HIGH (ref 140–400)
RBC: 3.95 10*6/uL (ref 3.80–5.10)
RDW: 15.7 % — ABNORMAL HIGH (ref 11.0–15.0)
Total Lymphocyte: 21.4 %
WBC: 5 10*3/uL (ref 3.8–10.8)

## 2020-11-27 LAB — EPSTEIN-BARR VIRUS VCA ANTIBODY PANEL
EBV NA IgG: 135 U/mL — ABNORMAL HIGH
EBV VCA IgG: >750 U/mL — ABNORMAL HIGH
EBV VCA IgM: <36 U/mL

## 2020-11-27 LAB — CMV ABS, IGG+IGM (CYTOMEGALOVIRUS)
CMV IgM: <30 AU/mL
Cytomegalovirus Ab-IgG: >10 U/mL — ABNORMAL HIGH

## 2020-11-27 LAB — BARTONELLA ANTIBODY PANEL
B. henselae IgG Screen: NEGATIVE
B. henselae IgM Screen: NEGATIVE

## 2020-11-27 LAB — AMYLASE: Amylase: 85 U/L (ref 21–101)

## 2020-11-27 LAB — HIV RNA, RTPCR W/R GT (RTI, PI,INT)
HIV 1 RNA Quant: 4160 copies/mL — ABNORMAL HIGH
HIV-1 RNA Quant, Log: 3.62 Log copies/mL — ABNORMAL HIGH

## 2020-11-27 LAB — RPR: RPR Ser Ql: NONREACTIVE

## 2020-11-27 LAB — LIPASE: Lipase: 41 U/L (ref 7–60)

## 2020-11-27 LAB — HIV-1 GENOTYPE: HIV-1 Genotype: DETECTED — AB

## 2020-12-03 ENCOUNTER — Encounter: Payer: Medicaid Other | Admitting: *Deleted

## 2020-12-09 ENCOUNTER — Other Ambulatory Visit (HOSPITAL_COMMUNITY): Payer: Self-pay

## 2021-01-04 ENCOUNTER — Telehealth: Payer: Self-pay

## 2021-01-04 NOTE — Telephone Encounter (Signed)
Called patient to schedule overdue follow up, call could not be completed.   Beryle Flock, RN

## 2021-01-10 ENCOUNTER — Encounter (HOSPITAL_COMMUNITY): Payer: Self-pay

## 2021-01-10 ENCOUNTER — Emergency Department (HOSPITAL_COMMUNITY): Payer: 59

## 2021-01-10 ENCOUNTER — Other Ambulatory Visit: Payer: Self-pay

## 2021-01-10 ENCOUNTER — Emergency Department (HOSPITAL_COMMUNITY)
Admission: EM | Admit: 2021-01-10 | Discharge: 2021-01-11 | Disposition: A | Discharge status: Home / Self Care | Payer: 59 | Attending: Emergency Medicine | Admitting: Emergency Medicine

## 2021-01-10 DIAGNOSIS — R112 Nausea with vomiting, unspecified: Secondary | ICD-10-CM | POA: Insufficient documentation

## 2021-01-10 DIAGNOSIS — R109 Unspecified abdominal pain: Secondary | ICD-10-CM | POA: Diagnosis not present

## 2021-01-10 DIAGNOSIS — Z5321 Procedure and treatment not carried out due to patient leaving prior to being seen by health care provider: Secondary | ICD-10-CM | POA: Insufficient documentation

## 2021-01-10 DIAGNOSIS — R6883 Chills (without fever): Secondary | ICD-10-CM | POA: Insufficient documentation

## 2021-01-10 LAB — BASIC METABOLIC PANEL
Anion gap: 11 (ref 5–15)
BUN: 9 mg/dL (ref 6–20)
CO2: 17 mmol/L — ABNORMAL LOW (ref 22–32)
Calcium: 9.1 mg/dL (ref 8.9–10.3)
Chloride: 108 mmol/L (ref 98–111)
Creatinine, Ser: 0.97 mg/dL (ref 0.44–1.00)
GFR, Estimated: >60 mL/min (ref >60)
Glucose, Bld: 79 mg/dL (ref 70–99)
Potassium: 3.9 mmol/L (ref 3.5–5.1)
Sodium: 136 mmol/L (ref 135–145)

## 2021-01-10 LAB — I-STAT BETA HCG BLOOD, ED (MC, WL, AP ONLY): I-stat hCG, quantitative: <5 m[IU]/mL (ref <5)

## 2021-01-10 NOTE — ED Triage Notes (Signed)
Pt BIB [facility] for eval of L sided flank pain, worse w/ palpation and movement. Pain x 2 days, states this feels similar to her last kidney stones. Endorses N/V, chills at home. Reports worsening over last 2 days, denies hematuria.

## 2021-01-10 NOTE — ED Notes (Signed)
CBC clotted per lab.  Ordered again.

## 2021-01-10 NOTE — ED Provider Notes (Signed)
Emergency Medicine Provider Triage Evaluation Note  [place] , a 30 y.o. female  was evaluated in triage.  Pt complains of right sided flank pain.  She reports that this feels like a kidney stone.  Her last one was in 2018.  She has been having dysuria since before this.  She reports that symptoms are right-sided and started 2 days ago.  No fevers.  Review of Systems  Positive: Right-sided flank pain Negative: Nausea, vomiting  Physical Exam  Ht 5\' 2"  (1.575 m)   Wt 56 kg   SpO2 100%   BMI 22.58 kg/m  Gen:   Awake Resp:  Normal effort  MSK:   Moves extremities without difficulty  Other:  Appears uncomfortable  Medical Decision Making  Medically screening exam initiated at 4:33 PM.  Appropriate orders placed.  Makylah [place] was informed that the remainder of the evaluation will be completed by another provider, this initial triage assessment does not replace that evaluation, and the importance of remaining in the ED until their evaluation is complete.  Note: Portions of this report may have been transcribed using voice recognition software. Every effort was made to ensure accuracy; however, inadvertent computerized transcription errors may be present    Lorin Glass, PA-C 01/10/21 1635    Tegeler, Gwenyth Allegra, MD 01/10/21 2100

## 2021-01-11 ENCOUNTER — Other Ambulatory Visit: Payer: Self-pay | Admitting: Infectious Disease

## 2021-01-11 DIAGNOSIS — N200 Calculus of kidney: Secondary | ICD-10-CM

## 2021-01-11 DIAGNOSIS — I10 Essential (primary) hypertension: Secondary | ICD-10-CM

## 2021-01-11 NOTE — ED Notes (Signed)
Pt decided to leave due to long wait

## 2021-01-12 ENCOUNTER — Other Ambulatory Visit: Payer: Self-pay | Admitting: Infectious Disease

## 2021-01-12 DIAGNOSIS — N200 Calculus of kidney: Secondary | ICD-10-CM

## 2021-01-18 ENCOUNTER — Other Ambulatory Visit: Payer: Self-pay

## 2021-01-18 ENCOUNTER — Ambulatory Visit (INDEPENDENT_AMBULATORY_CARE_PROVIDER_SITE_OTHER): Payer: 59 | Admitting: Student

## 2021-01-18 ENCOUNTER — Encounter: Payer: Self-pay | Admitting: Student

## 2021-01-18 VITALS — BP 123/85 | HR 95 | Temp 99.5°F | Resp 28 | Ht 62.0 in | Wt 118.4 lb

## 2021-01-18 DIAGNOSIS — R3589 Other polyuria: Secondary | ICD-10-CM

## 2021-01-18 DIAGNOSIS — N200 Calculus of kidney: Secondary | ICD-10-CM | POA: Diagnosis not present

## 2021-01-18 DIAGNOSIS — Z0000 Encounter for general adult medical examination without abnormal findings: Secondary | ICD-10-CM | POA: Insufficient documentation

## 2021-01-18 NOTE — Assessment & Plan Note (Addendum)
Kimberly Bishop is 30yo person presenting to clinic to establish care. She has a history of uncontrolled HIV, for which she is seen by ID and takes [place] and Descovy. Most recent CD4 count 40. She currently is in an experimental study for an injectable HIV medication. Otherwise, Kimberly Bishop has no other pertinent medical history.  She works at home for [employer]. Currently lives with her fiancee and 4-year old child. Denies current or history of tobacco use, alcohol, or ilicit drug use.  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 She is currently due for another. Due to her previous abnormal results, we will defer and have her follow-up with her OBGYN.  She does report getting the seasonal influenza vaccine last month.

## 2021-01-18 NOTE — Progress Notes (Signed)
   CC: establish care  HPI:  [name] is a 30 y.o. with uncontrolled HIV presenting to [facility] to establish care and follow-up recent ED visit for nephrolithiasis.  Please see problem-based list for further details.  Past Medical History:  Diagnosis Date   AIDS (acquired immunodeficiency syndrome), CD4 <=200 ([place])    History of kidney stones    HIV (human immunodeficiency virus infection) ([place])    Hypertension    Immune deficiency disorder ([place])    Infection    UTI   Kidney stones    Review of Systems:  As per HPI  Physical Exam:  Vitals:   01/18/21 1550  BP: 123/85  Pulse: 95  Temp: 99.5 F (37.5 C)  TempSrc: Oral  SpO2: 100%  Weight: 118 lb 6.4 oz (53.7 kg)   General: Resting comfortably in chair, no acute distress CV: Regular rate, rhythm. No murmurs appreciated. Pulm: Normal work of breathing on room air. Clear to auscultation bilaterally. MSK: Normal bulk, tone. No pitting edema bilaterally. Mild R sided flank pain. Neuro: Awake, alert, conversing appropriately. No focal deficits. Psych: Normal mood, affect, speech.  Assessment & Plan:   See Encounters Tab for problem based charting.  Patient discussed with Dr. Daryll Drown

## 2021-01-18 NOTE — Patient Instructions (Signed)
[place], it was a pleasure seeing you today!  Today we discussed: - We will check your urine for any signs of an infection.  - For your kidney stones, please take ibuprofen as needed. Since they are so small, we will need to keep watching them for now. We will check your kidney function at the next visit.   - Please make sure to follow-up with the infectious disease doctors and your OB/GYN for a PAP smear.   I have ordered the following labs today:  Lab Orders         Urinalysis, Reflex Microscopic       Follow-up: 6 months   Please make sure to arrive 15 minutes prior to your next appointment. If you arrive late, you may be asked to reschedule.   We look forward to seeing you next time. Please call our clinic at [phone] if you have any questions or concerns. The best time to call is Monday-Friday from 9am-4pm, but there is someone available 24/7. If after hours or the weekend, call the main hospital number and ask for the Internal Medicine Resident On-Call. If you need medication refills, please notify your pharmacy one week in advance and they will send [place] a request.  Thank you for letting us take part in your care. Wishing you the best!  Thank you, Sanjuan Dame, MD

## 2021-01-18 NOTE — Assessment & Plan Note (Addendum)
Patient has a history of kidney stones dating back to 2018. Most recently, patient visited the ED late last month with nausea and vomiting, found to have small kidney stone on L with multiple small stones near R renal cortex. She states that her symptoms had improved since this visit until last night. She reports sharp, right sided pain that began last night. She has not required any pain medications for this. Of note, patient did visit the urologist's office last Friday, who recommended supportive care.   Currently, Kimberly Bishop denies dysuria, nausea, vomiting, or abdominal pain. She does report recent polyuria over the last week or so. Denies fevers or other systemic symptoms. We will obtain UA today due to possible infection 2/2 obstruction. Otherwise, we will continue to monitor with supportive care for recurrent nephrolithiasis. Do not feel further urology referral is needed at this time.  - Tylenol, ibuprofen as needed for pain - Will obtain urinalysis today

## 2021-01-19 LAB — URINALYSIS, ROUTINE W REFLEX MICROSCOPIC
Bilirubin, UA: NEGATIVE
Glucose, UA: NEGATIVE
Ketones, UA: NEGATIVE
Nitrite, UA: POSITIVE — AB
Specific Gravity, UA: 1.008 (ref 1.005–1.030)
Urobilinogen, Ur: 0.2 mg/dL (ref 0.2–1.0)
pH, UA: 7.5 (ref 5.0–7.5)

## 2021-01-19 LAB — MICROSCOPIC EXAMINATION
Casts: NONE SEEN /lpf
RBC, Urine: NONE SEEN /hpf (ref 0–2)
WBC, UA: >30 /hpf — AB (ref 0–5)

## 2021-01-20 ENCOUNTER — Other Ambulatory Visit: Payer: Self-pay | Admitting: Student

## 2021-01-20 ENCOUNTER — Other Ambulatory Visit (HOSPITAL_COMMUNITY): Payer: Self-pay

## 2021-01-20 DIAGNOSIS — N3000 Acute cystitis without hematuria: Secondary | ICD-10-CM

## 2021-01-20 MED ORDER — NITROFURANTOIN MONOHYD MACRO 100 MG PO CAPS: 100.0000 mg | ORAL_CAPSULE | Freq: Two times a day (BID) | ORAL | 0 refills | Status: AC

## 2021-01-20 MED ORDER — NITROFURANTOIN MONOHYD MACRO 100 MG PO CAPS
100.0000 mg | ORAL_CAPSULE | Freq: Two times a day (BID) | ORAL | 0 refills | Status: DC
  Filled 2021-01-20: qty 6, 3d supply, fill #0

## 2021-01-20 NOTE — Addendum Note (Signed)
Addended bySanjuan Dame on: 01/20/2021 02:13 PM   Modules accepted: Orders

## 2021-01-22 NOTE — Progress Notes (Signed)
Internal Medicine Clinic Attending ? ?Case discussed with Dr. Braswell  At the time of the visit.  We reviewed the resident?s history and exam and pertinent patient test results.  I agree with the assessment, diagnosis, and plan of care documented in the resident?s note.  ?

## 2021-01-28 ENCOUNTER — Encounter: Payer: Medicaid Other | Admitting: *Deleted

## 2021-01-29 ENCOUNTER — Other Ambulatory Visit: Payer: Self-pay | Admitting: Pharmacist

## 2021-01-29 ENCOUNTER — Other Ambulatory Visit (HOSPITAL_COMMUNITY): Payer: Self-pay

## 2021-01-29 DIAGNOSIS — B20 Human immunodeficiency virus [HIV] disease: Secondary | ICD-10-CM

## 2021-01-29 MED ORDER — CABOTEGRAVIR & RILPIVIRINE ER 600 & 900 MG/3ML IM SUER
1.0000 | INTRAMUSCULAR | 5 refills | Status: DC
  Filled 2021-01-29: qty 6, 60d supply, fill #0

## 2021-01-29 MED ORDER — CABOTEGRAVIR & RILPIVIRINE ER 600 & 900 MG/3ML IM SUER
1.0000 | INTRAMUSCULAR | 1 refills | Status: DC
  Filled 2021-01-29 – 2021-02-01 (×2): qty 6, 30d supply, fill #0

## 2021-02-01 ENCOUNTER — Other Ambulatory Visit (HOSPITAL_COMMUNITY): Payer: Self-pay

## 2021-02-02 ENCOUNTER — Telehealth: Payer: Self-pay

## 2021-02-02 NOTE — Telephone Encounter (Signed)
[facility] Patient Advocate Encounter  Patient's medication [name]) have been couriered to [facility] from [employer] and will be administered on patient next office visit.  [name] , [place] [facility] Patient [facility] Phone: [phone] Fax:  [phone]

## 2021-02-03 ENCOUNTER — Encounter (INDEPENDENT_AMBULATORY_CARE_PROVIDER_SITE_OTHER): Payer: 59 | Admitting: *Deleted

## 2021-02-03 ENCOUNTER — Other Ambulatory Visit: Payer: Self-pay

## 2021-02-03 VITALS — BP 122/83 | HR 105 | Temp 98.8°F | Wt 118.5 lb

## 2021-02-03 DIAGNOSIS — Z006 Encounter for examination for normal comparison and control in clinical research program: Secondary | ICD-10-CM

## 2021-02-03 NOTE — Research (Signed)
Kimberly Bishop was here for week 16 of A5359. She missed weeks 8 and 12 due to scheduling issues. She said she had started a new job and couldn't get off work. She desperately wants to start cabaneuva and dislikes taking any oral medications. She is going to meet with Dr. Tommy Medal in a few weeks to discuss that and if he is willing to start on injections, we will take her off study. We were able to get a viral load/genotype and CD4 drawn today. She is a very difficult stick and was stuck 5 times just to get those labs, and we did not pursue getting the missed labs from week 12 because of that reason.

## 2021-02-12 LAB — COMPREHENSIVE METABOLIC PANEL
AG Ratio: 1 (calc) (ref 1.0–2.5)
ALT: 33 U/L — ABNORMAL HIGH (ref 6–29)
AST: 30 U/L (ref 10–30)
Albumin: 4 g/dL (ref 3.6–5.1)
Alkaline phosphatase (APISO): 243 U/L — ABNORMAL HIGH (ref 31–125)
BUN: 8 mg/dL (ref 7–25)
CO2: 21 mmol/L (ref 20–32)
Calcium: 9 mg/dL (ref 8.6–10.2)
Chloride: 107 mmol/L (ref 98–110)
Creat: 0.87 mg/dL (ref 0.50–0.97)
Globulin: 4.1 g/dL (calc) — ABNORMAL HIGH (ref 1.9–3.7)
Glucose, Bld: 83 mg/dL (ref 65–99)
Potassium: 3.4 mmol/L — ABNORMAL LOW (ref 3.5–5.3)
Sodium: 138 mmol/L (ref 135–146)
Total Bilirubin: 0.4 mg/dL (ref 0.2–1.2)
Total Protein: 8.1 g/dL (ref 6.1–8.1)

## 2021-02-12 LAB — CBC WITH DIFFERENTIAL/PLATELET
Absolute Monocytes: 420 cells/uL (ref 200–950)
Basophils Absolute: 8 cells/uL (ref 0–200)
Basophils Relative: 0.2 %
Eosinophils Absolute: 59 cells/uL (ref 15–500)
Eosinophils Relative: 1.4 %
HCT: 34.4 % — ABNORMAL LOW (ref 35.0–45.0)
Hemoglobin: 11.3 g/dL — ABNORMAL LOW (ref 11.7–15.5)
Lymphs Abs: 630 cells/uL — ABNORMAL LOW (ref 850–3900)
MCH: 26.1 pg — ABNORMAL LOW (ref 27.0–33.0)
MCHC: 32.8 g/dL (ref 32.0–36.0)
MCV: 79.4 fL — ABNORMAL LOW (ref 80.0–100.0)
MPV: 10.9 fL (ref 7.5–12.5)
Monocytes Relative: 10 %
Neutro Abs: 3083 cells/uL (ref 1500–7800)
Neutrophils Relative %: 73.4 %
Platelets: 365 10*3/uL (ref 140–400)
RBC: 4.33 10*6/uL (ref 3.80–5.10)
RDW: 15.8 % — ABNORMAL HIGH (ref 11.0–15.0)
Total Lymphocyte: 15 %
WBC: 4.2 10*3/uL (ref 3.8–10.8)

## 2021-02-12 LAB — HIV-1 INTEGRASE GENOTYPE

## 2021-02-12 LAB — T-HELPER CELLS (CD4) COUNT (NOT AT ARMC)
Absolute CD4: 31 cells/uL — ABNORMAL LOW (ref 490–1740)
CD4 T Helper %: 5 % — ABNORMAL LOW (ref 30–61)
Total lymphocyte count: 627 cells/uL — ABNORMAL LOW (ref 850–3900)

## 2021-02-12 LAB — HIV RNA, RTPCR W/R GT (RTI, PI,INT)
HIV 1 RNA Quant: 54400 copies/mL — ABNORMAL HIGH
HIV-1 RNA Quant, Log: 4.74 Log copies/mL — ABNORMAL HIGH

## 2021-02-12 LAB — BILIRUBIN, DIRECT: Bilirubin, Direct: 0.1 mg/dL (ref 0.0–0.2)

## 2021-02-12 LAB — PREGNANCY, URINE: Preg Test, Ur: NEGATIVE

## 2021-02-12 LAB — HIV-1 GENOTYPE: HIV-1 Genotype: DETECTED — AB

## 2021-02-18 ENCOUNTER — Ambulatory Visit (INDEPENDENT_AMBULATORY_CARE_PROVIDER_SITE_OTHER): Payer: 59 | Admitting: Infectious Disease

## 2021-02-18 ENCOUNTER — Ambulatory Visit (INDEPENDENT_AMBULATORY_CARE_PROVIDER_SITE_OTHER): Payer: 59 | Admitting: Pharmacist

## 2021-02-18 ENCOUNTER — Other Ambulatory Visit (HOSPITAL_COMMUNITY): Payer: Self-pay

## 2021-02-18 ENCOUNTER — Other Ambulatory Visit: Payer: Self-pay

## 2021-02-18 VITALS — BP 120/84 | HR 92 | Temp 98.6°F | Wt 120.0 lb

## 2021-02-18 DIAGNOSIS — B20 Human immunodeficiency virus [HIV] disease: Secondary | ICD-10-CM

## 2021-02-18 DIAGNOSIS — L989 Disorder of the skin and subcutaneous tissue, unspecified: Secondary | ICD-10-CM

## 2021-02-18 DIAGNOSIS — Z91199 Patient's noncompliance with other medical treatment and regimen due to unspecified reason: Secondary | ICD-10-CM | POA: Diagnosis not present

## 2021-02-18 MED ORDER — CABENUVA 400 & 600 MG/2ML IM SUER
1.0000 | INTRAMUSCULAR | 5 refills | Status: DC
  Filled 2021-02-18 – 2021-03-02 (×2): qty 4, 30d supply, fill #0
  Filled 2021-03-30 – 2021-04-15 (×3): qty 4, 30d supply, fill #1
  Filled 2021-05-11: qty 4, 30d supply, fill #2
  Filled 2021-06-07: qty 4, 30d supply, fill #3
  Filled 2021-07-07: qty 4, 30d supply, fill #4
  Filled 2021-08-06: qty 4, 30d supply, fill #5

## 2021-02-18 MED ORDER — CABOTEGRAVIR & RILPIVIRINE ER 600 & 900 MG/3ML IM SUER
1.0000 | Freq: Once | INTRAMUSCULAR | Status: AC
  Administered 2021-02-18: 1 via INTRAMUSCULAR

## 2021-02-18 MED ORDER — TRIAMCINOLONE ACETONIDE 0.5 % EX OINT: 1.0000 "application " | TOPICAL_OINTMENT | Freq: Two times a day (BID) | CUTANEOUS | 2 refills | Status: DC

## 2021-02-18 NOTE — Progress Notes (Signed)
Subjective:  Chief complaint: n lesion on her scalp that is itching intensely  Patient ID: Kimberly Bishop, female    DOB: [birth year], 30 y.o.   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  Unfortunately she has only been able to be adherent to medications while pregnant.  She now has an 30 year old girl.   I asked her when the last time she was able to be suppressed she said while she was pregnant with her child.  She was enrolled into LATTITUDE study and had been prescribed TIVICAY and DESCOVY.   Unfortunately she was not able to adhere with oral therapy to stay in that study.  She had voiced interest in simply being placed on long-acting therapy and after discussion with Herschell Dimes and Magda Kiel  from [facility] I felt that giving Cabenuva to her off label in context of uremia similar to what has been done in [facility] would be worth the risk given that she continues to not be adherent with oral therapy and continues to be viremic with a low CD4 count.  Has developed a lesion on her scalp that itches intensely and which has not responded to topical Neosporin   Past Medical History:  Diagnosis Date   AIDS (acquired immunodeficiency syndrome), CD4 <=200 ([place])    History of kidney stones    HIV (human immunodeficiency virus infection) ([place])    Hypertension    Immune deficiency disorder ([place])    Infection    UTI   Kidney stones     Past Surgical History:  Procedure Laterality Date   APPENDECTOMY  2011   CESAREAN SECTION N/A 03/29/2019   Procedure: CESAREAN SECTION;  Surgeon: Jonnie Kind, MD;  Location: [facility];  Service: Obstetrics;  Laterality: N/A;   CYSTOSCOPY W/ URETERAL STENT PLACEMENT Right 11/03/2016   Procedure: CYSTOSCOPY WITH RETROGRADE PYELOGRAM/URETERAL STENT PLACEMENT;  Surgeon: Kathie Rhodes, MD;  Location: [facility];  Service: Urology;   Laterality: Right;   CYSTOSCOPY/URETEROSCOPY/HOLMIUM LASER/STENT PLACEMENT Right 01/16/2017   Procedure: CYSTOSCOPY/URETEROSCOPY/ STONE EXTRACTION/STENT removal;  Surgeon: Kathie Rhodes, MD;  Location: [facility];  Service: Urology;  Laterality: Right;   IR DIL URETER RIGHT  12/05/2016   IR NEPHROSTOMY PLACEMENT RIGHT  11/07/2016   IR URETERAL STENT PLACEMENT EXISTING ACCESS RIGHT  12/05/2016   NEPHROLITHOTOMY Right 12/05/2016   Procedure: NEPHROLITHOTOMY PERCUTANEOUS;  Surgeon: Kathie Rhodes, MD;  Location: [facility];  Service: Urology;  Laterality: Right;    Family History  Adopted: Yes  Problem Relation Age of Onset   Hypertension Mother    Autism Brother       Social History   Socioeconomic History   Marital status: Single    Spouse name: Not on file   Number of children: Not on file   Years of education: Not on file   Highest education level: Not on file  Occupational History    Comment: unemployed  Tobacco Use   Smoking status: Never   Smokeless tobacco: Never  Vaping Use   Vaping Use: Never used  Substance and Sexual Activity   Alcohol use: Not Currently    Comment: occasional -    Drug use: Not Currently    Types: Marijuana    Comment: last use 08/24/18   Sexual activity: Yes    Partners: Male    Birth control/protection: None  Other Topics Concern   Not on file  Social History Narrative   Not on file   Social Determinants of Health   Financial Resource Strain: Not on file  Food Insecurity: Not on file  Transportation Needs: Not on file  Physical Activity: Not on file  Stress: Not on file  Social Connections: Not on file    No Known Allergies   Current Outpatient Medications:    Blood Pressure Monitoring DEVI, 1 Device by Does not apply route once a week. ICD 10: O09.90, Disp: 1 Device, Rfl: 0   cabotegravir & rilpivirine ER (CABENUVA) 600 & 900 MG/3ML injection, Inject 1 kit into the muscle every 30 (thirty) days., Disp: 6 mL, Rfl: 1   cabotegravir &  rilpivirine ER (CABENUVA) 600 & 900 MG/3ML injection, Inject 1 kit into the muscle every 2 (two) months., Disp: 6 mL, Rfl: 5   dolutegravir (TIVICAY) 50 MG tablet, Take 1 tablet (50 mg total) by mouth daily., Disp: 30 tablet, Rfl: 5   emtricitabine-tenofovir AF (DESCOVY) 200-25 MG tablet, Take 1 tablet by mouth daily., Disp: 30 tablet, Rfl: 5   mirtazapine (REMERON) 15 MG tablet, Take 1 tablet (15 mg total) by mouth at bedtime., Disp: 30 tablet, Rfl: 0   promethazine (PHENERGAN) 25 MG tablet, Take 1 tablet (25 mg total) by mouth every 6 (six) hours as needed for nausea or vomiting., Disp: 120 tablet, Rfl: 4   sulfamethoxazole-trimethoprim (BACTRIM) 400-80 MG tablet, Take 1 tablet by mouth daily., Disp: 30 tablet, Rfl: 5   Review of Systems  Constitutional:  Negative for activity change, appetite change, chills, diaphoresis, fatigue, fever and unexpected weight change.  HENT:  Negative for congestion, rhinorrhea, sinus pressure, sneezing, sore throat and trouble swallowing.   Eyes:  Negative for photophobia and visual disturbance.  Respiratory:  Negative for cough, chest tightness, shortness of breath, wheezing and stridor.   Cardiovascular:  Negative for chest pain, palpitations and leg swelling.  Gastrointestinal:  Negative for abdominal distention, abdominal pain, anal bleeding, blood in stool, constipation, diarrhea, nausea and vomiting.  Genitourinary:  Negative for difficulty urinating, dysuria, flank pain and hematuria.  Musculoskeletal:  Negative for arthralgias, back pain, gait problem, joint swelling and myalgias.  Skin:  Positive for rash. Negative for color change, pallor and wound.  Neurological:  Negative for dizziness, tremors, weakness and light-headedness.  Hematological:  Negative for adenopathy. Does not bruise/bleed easily.  Psychiatric/Behavioral:  Negative for agitation, behavioral problems, confusion, decreased concentration, dysphoric mood and sleep disturbance.        Objective:   Physical Exam Constitutional:      General: She is not in acute distress.    Appearance: Normal appearance. She is well-developed. She is not ill-appearing or diaphoretic.  HENT:     Head: Normocephalic and atraumatic.     Right Ear: Hearing and external ear normal.     Left Ear: Hearing and external ear normal.     Nose: No nasal deformity or rhinorrhea.  Eyes:     General: No scleral icterus.    Conjunctiva/sclera: Conjunctivae normal.     Right eye: Right conjunctiva is not injected.     Left eye: Left conjunctiva is not injected.     Pupils: Pupils are equal, round, and reactive to light.  Neck:     Vascular: No JVD.  Cardiovascular:     Rate and Rhythm: Normal rate and regular rhythm.     Heart sounds: S1 normal and S2 normal.  Pulmonary:     Effort: Pulmonary effort is normal. No respiratory distress.  Breath sounds: No wheezing.  Abdominal:     General: There is no distension.     Palpations: Abdomen is soft.  Musculoskeletal:        General: Normal range of motion.     Right shoulder: Normal.     Left shoulder: Normal.     Cervical back: Normal range of motion and neck supple.     Right hip: Normal.     Left hip: Normal.     Right knee: Normal.     Left knee: Normal.  Lymphadenopathy:     Head:     Right side of head: No submandibular, preauricular or posterior auricular adenopathy.     Left side of head: No submandibular, preauricular or posterior auricular adenopathy.     Cervical: No cervical adenopathy.     Right cervical: No superficial or deep cervical adenopathy.    Left cervical: No superficial or deep cervical adenopathy.  Skin:    General: Skin is warm and dry.     Coloration: Skin is not pale.     Findings: Rash present. No abrasion, bruising, ecchymosis, erythema or lesion.     Nails: There is no clubbing.  Neurological:     General: No focal deficit present.     Mental Status: She is alert and oriented to person, place, and  time.     Sensory: No sensory deficit.     Coordination: Coordination normal.     Gait: Gait normal.  Psychiatric:        Attention and Perception: She is attentive.        Mood and Affect: Mood normal.        Speech: Speech normal.        Behavior: Behavior normal. Behavior is cooperative.        Thought Content: Thought content normal.        Judgment: Judgment normal.     Facial rash 02/18/2021:        Assessment & Plan:   HIV and AIDS:  We are going to give her Cabenuva now and monthly.  He tells me that she will be able to make it to her monthly visits for injections.  I counseled her on injection site reactions and also risk of urological failure in particular if she does not come to her appointments on time.  I am rechecking a viral load today and we will check while with the injection  Facial rash we will check an RPR though I do not think this is syphilis.  I will give her triamcinolone to try and if does not help we will try topical antifungal.   I spent 40  minutes with the patient including face to face counseling of the patient regarding the nature of off label prescribing reasons for trying long-acting therapy in her specific situation, reviewing her viral loads prior genotypes CD4 count CBC CMP along with review of medical records before and during the visit and in coordination of her care.

## 2021-02-18 NOTE — Progress Notes (Signed)
HPI: Kimberly Bishop is a 30 y.o. female who presents to the [facility] for [place] administration.  Patient Active Problem List   Diagnosis Date Noted   Healthcare maintenance 01/18/2021   Nephrolithiasis 01/18/2021   AIDS (acquired immunodeficiency syndrome), CD4 <=200 (H[facility])    HIV (human immunodeficiency virus infection) ([place]) 03/29/2019   CIN I (cervical intraepithelial neoplasia I) 11/28/2018   ASCUS with positive high risk HPV cervical 11/26/2018   Molluscum contagiosum 03/22/2016    Patient's Medications  New Prescriptions   TRIAMCINOLONE OINTMENT (KENALOG) 0.5 %    Apply 1 application topically 2 (two) times daily.  Previous Medications   BLOOD PRESSURE MONITORING DEVI    1 Device by Does not apply route once a week. ICD 10: O09.90   CABOTEGRAVIR & RILPIVIRINE ER (CABENUVA) 600 & 900 MG/3ML INJECTION    Inject 1 kit into the muscle every 30 (thirty) days.   CABOTEGRAVIR & RILPIVIRINE ER (CABENUVA) 600 & 900 MG/3ML INJECTION    Inject 1 kit into the muscle every 2 (two) months.   DOLUTEGRAVIR (TIVICAY) 50 MG TABLET    Take 1 tablet (50 mg total) by mouth daily.   EMTRICITABINE-TENOFOVIR AF (DESCOVY) 200-25 MG TABLET    Take 1 tablet by mouth daily.   MIRTAZAPINE (REMERON) 15 MG TABLET    Take 1 tablet (15 mg total) by mouth at bedtime.   PROMETHAZINE (PHENERGAN) 25 MG TABLET    Take 1 tablet (25 mg total) by mouth every 6 (six) hours as needed for nausea or vomiting.   SULFAMETHOXAZOLE-TRIMETHOPRIM (BACTRIM) 400-80 MG TABLET    Take 1 tablet by mouth daily.  Modified Medications   No medications on file  Discontinued Medications   No medications on file    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 ([place])    History of kidney stones    HIV (human immunodeficiency virus infection) ([place])    Hypertension    Immune deficiency disorder ([place])    Infection     UTI   Kidney stones     Social History: Social History   Socioeconomic History   Marital status: Single    Spouse name: Not on file   Number of children: Not on file   Years of education: Not on file   Highest education level: Not on file  Occupational History    Comment: unemployed  Tobacco Use   Smoking status: Never   Smokeless tobacco: Never  Vaping Use   Vaping Use: Never used  Substance and Sexual Activity   Alcohol use: Not Currently    Comment: occasional -    Drug use: Not Currently    Types: Marijuana    Comment: last use 08/24/18   Sexual activity: Yes    Partners: Male    Birth control/protection: None  Other Topics Concern   Not on file  Social History Narrative   Not on file   Social Determinants of Health   Financial Resource Strain: Not on file  Food Insecurity: Not on file  Transportation Needs: Not on file  Physical Activity: Not on file  Stress: Not on file  Social Connections: Not on file    Labs: Lab Results  Component Value Date   HIV1RNAQUANT 54,400 (H) 02/03/2021   HIV1RNAQUANT 39,300 (H) 11/12/2020   HIV1RNAQUANT 4,160 (H) 10/13/2020   CD4TABS 40 (L) 10/13/2020   CD4TABS 67 (L) 08/21/2019  CD4TABS 68 (L) 02/04/2019    RPR and STI Lab Results  Component Value Date   LABRPR NON-REACTIVE 10/13/2020   LABRPR NON REACTIVE 03/26/2019   LABRPR Non Reactive 01/31/2019   LABRPR Non Reactive 10/18/2018   LABRPR NON-REACTIVE 05/25/2018    STI Results GC GC CT CT  Latest Ref Rng & Units - NEGATIVE - NEGATIVE  10/13/2020 Negative - Negative -  03/26/2019 Negative - Negative -  10/18/2018 Negative - Negative -  05/25/2018 Negative - **POSITIVE**(A) -  03/28/2017 Negative - Negative -  02/11/2017 Negative - Negative -  07/07/2016 Negative - Negative -  10/27/2015 Negative - Negative -  02/07/2015 Negative - **POSITIVE**(A) -  05/19/2014 NG: Negative - CT: Negative -  12/23/2013 - NEGATIVE - NEGATIVE  05/16/2013 NG: Negative - CT: Negative  -  10/22/2012 - NEGATIVE - NEGATIVE  02/12/2012 - NEGATIVE - NEGATIVE    Hepatitis B Lab Results  Component Value Date   HEPBSAB NON-REACTIVE 09/10/2020   HEPBSAG NON-REACTIVE 09/10/2020   HEPBCAB NON-REACTIVE 09/10/2020   Hepatitis C Lab Results  Component Value Date   HEPCAB NON-REACTIVE 09/10/2020   Hepatitis A Lab Results  Component Value Date   HAV NEG 12/09/2011   Lipids: Lab Results  Component Value Date   CHOL 182 10/08/2020   TRIG 83 10/08/2020   HDL 42 (L) 10/08/2020   CHOLHDL 4.3 10/08/2020   VLDL 9 10/27/2015   LDLCALC 122 (H) 10/08/2020    Current HIV Regimen: Tivicay + Descovy  TARGET DATE: The 8th of the month  Assessment: Kimberly Bishop presents today for their first initiation injection for Cabenuva. Counseled that [place] is two separate intramuscular injections in the gluteal muscle on each side for each visit. Explained that the first set of injections are 42m each and then the dose decreases to 2 mL each in the following months. Discussed the need for viral load monitoring every month for the first 6 months and then periodically afterwards as their provider sees the need. Discussed the rare but significant chance of developing resistance despite compliance. Explained that showing up to injection appointments is very important and warned that if 2 appointments are missed, it will be reassessed by their provider whether they are a good candidate for injection therapy. Counseled on possible side effects associated with the injections such as injection site pain, which is usually mild to moderate in nature, injection site nodules, and injection site reactions. Asked to call the clinic or send me a mychart message if they experience any issues, such as fatigue, nausea, headache, rash, or dizziness. Advised that they can take ibuprofen or tylenol for injection site pain if needed.   Administered cabotegravir 6027m11mL in left upper outer quadrant of the gluteal muscle.  Administered rilpivirine 900 mg/11m18mn the right upper outer quadrant of the gluteal muscle. Monitored patient for 10 minutes after injection. Injections were tolerated well without issue. Counseled to stop taking Tivicay and Descovy after today's dose and to call with any issues that may arise. Will make follow up appointments for monthly continuation injections.  Plan: - Stop Tivicay and DesAgrajections administered - Next set of injections scheduled for 03/22/21 and 04/21/21 - Call with any issues or questions  Kimberly Bishop, PharmD, BCIDP, AAHIVP, CPPCromwellinical Pharmacist Practitioner [facility]

## 2021-02-23 ENCOUNTER — Other Ambulatory Visit (HOSPITAL_COMMUNITY): Payer: Self-pay

## 2021-03-02 ENCOUNTER — Other Ambulatory Visit (HOSPITAL_COMMUNITY): Payer: Self-pay

## 2021-03-03 ENCOUNTER — Telehealth: Payer: Self-pay

## 2021-03-03 LAB — HIV RNA, RTPCR W/R GT (RTI, PI,INT)
HIV 1 RNA Quant: 76800 copies/mL — ABNORMAL HIGH
HIV-1 RNA Quant, Log: 4.89 Log copies/mL — ABNORMAL HIGH

## 2021-03-03 LAB — HIV-1 INTEGRASE GENOTYPE

## 2021-03-03 LAB — HIV-1 GENOTYPE: HIV-1 Genotype: DETECTED — AB

## 2021-03-03 LAB — RPR: RPR Ser Ql: NONREACTIVE

## 2021-03-03 NOTE — Telephone Encounter (Signed)
[facility] Patient Advocate Encounter  Patient's medication (Cabenuva 400 &600mg ) have been couriered to [facility] from [facility] and will administered on the patient next office visit on 03/22/20.  [name] , [facility] Patient [facility] Infectious [facility] Phone: [phone] Fax:  [phone]

## 2021-03-17 ENCOUNTER — Encounter: Payer: Self-pay | Admitting: Pharmacist

## 2021-03-22 ENCOUNTER — Ambulatory Visit: Payer: 59 | Admitting: Pharmacist

## 2021-03-24 ENCOUNTER — Other Ambulatory Visit: Payer: Self-pay

## 2021-03-24 ENCOUNTER — Ambulatory Visit (INDEPENDENT_AMBULATORY_CARE_PROVIDER_SITE_OTHER): Payer: 59 | Admitting: Pharmacist

## 2021-03-24 DIAGNOSIS — B20 Human immunodeficiency virus [HIV] disease: Secondary | ICD-10-CM | POA: Diagnosis not present

## 2021-03-24 MED ORDER — CABOTEGRAVIR & RILPIVIRINE ER 400 & 600 MG/2ML IM SUER
1.0000 | Freq: Once | INTRAMUSCULAR | Status: AC
  Administered 2021-03-24: 1 via INTRAMUSCULAR

## 2021-03-24 NOTE — Progress Notes (Signed)
03/24/2021  HPI: Kimberly Bishop is a 31 y.o. female who presents to the [facility] clinic for [place] administration.  Patient Active Problem List   Diagnosis Date Noted   Healthcare maintenance 01/18/2021   Nephrolithiasis 01/18/2021   AIDS (acquired immunodeficiency syndrome), CD4 <=200 ([facility])    HIV (human immunodeficiency virus infection) ([place]) 03/29/2019   CIN I (cervical intraepithelial neoplasia I) 11/28/2018   ASCUS with positive high risk HPV cervical 11/26/2018   Molluscum contagiosum 03/22/2016    Patient's Medications  New Prescriptions   No medications on file  Previous Medications   BLOOD PRESSURE MONITORING DEVI    1 Device by Does not apply route once a week. ICD 10: O09.90   CABOTEGRAVIR & RILPIVIRINE ER (CABENUVA) 400 & 600 MG/2ML INJECTION    Inject 1 kit into the muscle every 30 (thirty) days.   DOLUTEGRAVIR (TIVICAY) 50 MG TABLET    Take 1 tablet (50 mg total) by mouth daily.   EMTRICITABINE-TENOFOVIR AF (DESCOVY) 200-25 MG TABLET    Take 1 tablet by mouth daily.   MIRTAZAPINE (REMERON) 15 MG TABLET    Take 1 tablet (15 mg total) by mouth at bedtime.   PROMETHAZINE (PHENERGAN) 25 MG TABLET    Take 1 tablet (25 mg total) by mouth every 6 (six) hours as needed for nausea or vomiting.   SULFAMETHOXAZOLE-TRIMETHOPRIM (BACTRIM) 400-80 MG TABLET    Take 1 tablet by mouth daily.   TRIAMCINOLONE OINTMENT (KENALOG) 0.5 %    Apply 1 application topically 2 (two) times daily.  Modified Medications   No medications on file  Discontinued Medications   No medications on file    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 ([place])    History of kidney stones    HIV (human immunodeficiency virus infection) ([place])    Hypertension    Immune deficiency disorder ([place])    Infection    UTI   Kidney stones     Social History: Social History   Socioeconomic  History   Marital status: Single    Spouse name: Not on file   Number of children: Not on file   Years of education: Not on file   Highest education level: Not on file  Occupational History    Comment: unemployed  Tobacco Use   Smoking status: Never   Smokeless tobacco: Never  Vaping Use   Vaping Use: Never used  Substance and Sexual Activity   Alcohol use: Not Currently    Comment: occasional -    Drug use: Not Currently    Types: Marijuana    Comment: last use 08/24/18   Sexual activity: Yes    Partners: Male    Birth control/protection: None  Other Topics Concern   Not on file  Social History Narrative   Not on file   Social Determinants of Health   Financial Resource Strain: Not on file  Food Insecurity: Not on file  Transportation Needs: Not on file  Physical Activity: Not on file  Stress: Not on file  Social Connections: Not on file    Labs: Lab Results  Component Value Date   HIV1RNAQUANT 76,800 (H) 02/18/2021   HIV1RNAQUANT 54,400 (H) 02/03/2021   HIV1RNAQUANT 39,300 (H) 11/12/2020   CD4TABS 40 (L) 10/13/2020   CD4TABS 67 (L) 08/21/2019   CD4TABS 68 (L) 02/04/2019    RPR and STI Lab Results  Component Value Date  LABRPR NON-REACTIVE 02/18/2021   LABRPR NON-REACTIVE 10/13/2020   LABRPR NON REACTIVE 03/26/2019   LABRPR Non Reactive 01/31/2019   LABRPR Non Reactive 10/18/2018    STI Results GC GC CT CT  Latest Ref Rng & Units - NEGATIVE - NEGATIVE  10/13/2020 Negative - Negative -  03/26/2019 Negative - Negative -  10/18/2018 Negative - Negative -  05/25/2018 Negative - **POSITIVE**(A) -  03/28/2017 Negative - Negative -  02/11/2017 Negative - Negative -  07/07/2016 Negative - Negative -  10/27/2015 Negative - Negative -  02/07/2015 Negative - **POSITIVE**(A) -  05/19/2014 NG: Negative - CT: Negative -  12/23/2013 - NEGATIVE - NEGATIVE  05/16/2013 NG: Negative - CT: Negative -  10/22/2012 - NEGATIVE - NEGATIVE  02/12/2012 - NEGATIVE - NEGATIVE     Hepatitis B Lab Results  Component Value Date   HEPBSAB NON-REACTIVE 09/10/2020   HEPBSAG NON-REACTIVE 09/10/2020   HEPBCAB NON-REACTIVE 09/10/2020   Hepatitis C Lab Results  Component Value Date   HEPCAB NON-REACTIVE 09/10/2020   Hepatitis A Lab Results  Component Value Date   HAV NEG 12/09/2011   Lipids: Lab Results  Component Value Date   CHOL 182 10/08/2020   TRIG 83 10/08/2020   HDL 42 (L) 10/08/2020   CHOLHDL 4.3 10/08/2020   VLDL 9 10/27/2015   LDLCALC 122 (H) 10/08/2020    Current HIV Regimen: Cabenuva 462m/600mg IM monthly  TARGET DATE: The 8th of the month  Assessment: Kimberly Bishop presents today for monthly continuation Cabenuva injection. Was initiated on [place]at last visit and tolerated injection well with no systemic issues. Patient reported pain and soreness around injection site with difficulty walking, counseled to take ibuprofen to manage injection site pain. Will check viral load and CD4 count today. Due for flu shot but deferred to next visit.  Administered cabotegravir 4076m2mL in the left upper outer quadrant of the gluteal muscle. Administered rilpivirine 60054mmL in the right upper outer quadrant of the gluteal muscle. Injections were well tolerated. Will follow-up in 1 month for next injection and labs.  Plan: -- Administered Cabenuva injections -- Will check HIV RNA viral load and CD4 count -- Due for flu vaccine at next visit -- Next f/u scheduled 04/22/21 and 05/19/21 -- Call with any issues or questions  YuxMichela PitcherhiThree OaksharmD Student  03/24/2021, 9:27 AM

## 2021-03-25 LAB — T-HELPER CELL (CD4) - (RCID CLINIC ONLY)
CD4 % Helper T Cell: 7 % — ABNORMAL LOW (ref 33–65)
CD4 T Cell Abs: 93 /uL — ABNORMAL LOW (ref 400–1790)

## 2021-03-27 LAB — HIV-1 RNA QUANT-NO REFLEX-BLD
HIV 1 RNA Quant: 116 Copies/mL — ABNORMAL HIGH
HIV-1 RNA Quant, Log: 2.06 Log cps/mL — ABNORMAL HIGH

## 2021-03-30 ENCOUNTER — Other Ambulatory Visit (HOSPITAL_COMMUNITY): Payer: Self-pay

## 2021-04-09 ENCOUNTER — Other Ambulatory Visit (HOSPITAL_COMMUNITY): Payer: Self-pay

## 2021-04-15 ENCOUNTER — Other Ambulatory Visit (HOSPITAL_COMMUNITY): Payer: Self-pay

## 2021-04-16 ENCOUNTER — Other Ambulatory Visit (HOSPITAL_COMMUNITY): Payer: Self-pay

## 2021-04-19 ENCOUNTER — Other Ambulatory Visit (HOSPITAL_COMMUNITY): Payer: Self-pay

## 2021-04-20 ENCOUNTER — Telehealth: Payer: Self-pay

## 2021-04-20 NOTE — Telephone Encounter (Signed)
[facility] Patient Advocate Encounter  Patient's medication (cabenuva 400 & 600 mg/60ml) have been couriered to [facility] from [facility] and will be administered on patient next office visit on  04/22/21.  [name] , [facility] Patient [facility] for Infectious Disease Phone: [phone] Fax:  [phone]

## 2021-04-21 ENCOUNTER — Ambulatory Visit: Payer: 59 | Admitting: Infectious Disease

## 2021-04-22 ENCOUNTER — Encounter: Payer: 59 | Admitting: Pharmacist

## 2021-04-22 ENCOUNTER — Ambulatory Visit (INDEPENDENT_AMBULATORY_CARE_PROVIDER_SITE_OTHER): Payer: 59 | Admitting: Pharmacist

## 2021-04-22 ENCOUNTER — Other Ambulatory Visit: Payer: Self-pay

## 2021-04-22 ENCOUNTER — Encounter: Payer: Self-pay | Admitting: Pharmacist

## 2021-04-22 DIAGNOSIS — B20 Human immunodeficiency virus [HIV] disease: Secondary | ICD-10-CM

## 2021-04-22 MED ORDER — CABOTEGRAVIR & RILPIVIRINE ER 400 & 600 MG/2ML IM SUER
1.0000 | Freq: Once | INTRAMUSCULAR | Status: AC
  Administered 2021-04-22: 1 via INTRAMUSCULAR

## 2021-04-22 NOTE — Progress Notes (Addendum)
HPI: Kimberly Bishop is a 31 y.o. female who presents to the [facility] clinic for [place] administration.  Patient Active Problem List   Diagnosis Date Noted   Healthcare maintenance 01/18/2021   Nephrolithiasis 01/18/2021   AIDS (acquired immunodeficiency syndrome), CD4 <=200 (HCC)    HIV (human immunodeficiency virus infection) ([place]) 03/29/2019   CIN I (cervical intraepithelial neoplasia I) 11/28/2018   ASCUS with positive high risk HPV cervical 11/26/2018   Molluscum contagiosum 03/22/2016    Patient's Medications  New Prescriptions   No medications on file  Previous Medications   BLOOD PRESSURE MONITORING DEVI    1 Device by Does not apply route once a week. ICD 10: O09.90   CABOTEGRAVIR & RILPIVIRINE ER (CABENUVA) 400 & 600 MG/2ML INJECTION    Inject 1 kit into the muscle every 30 (thirty) days.   DOLUTEGRAVIR (TIVICAY) 50 MG TABLET    Take 1 tablet (50 mg total) by mouth daily.   EMTRICITABINE-TENOFOVIR AF (DESCOVY) 200-25 MG TABLET    Take 1 tablet by mouth daily.   MIRTAZAPINE (REMERON) 15 MG TABLET    Take 1 tablet (15 mg total) by mouth at bedtime.   PROMETHAZINE (PHENERGAN) 25 MG TABLET    Take 1 tablet (25 mg total) by mouth every 6 (six) hours as needed for nausea or vomiting.   SULFAMETHOXAZOLE-TRIMETHOPRIM (BACTRIM) 400-80 MG TABLET    Take 1 tablet by mouth daily.   TRIAMCINOLONE OINTMENT (KENALOG) 0.5 %    Apply 1 application topically 2 (two) times daily.  Modified Medications   No medications on file  Discontinued Medications   No medications on file    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 ([place])    History of kidney stones    HIV (human immunodeficiency virus infection) ([place])    Hypertension    Immune deficiency disorder ([place])    Infection    UTI   Kidney stones     Social History: Social History   Socioeconomic History    Marital status: Single    Spouse name: Not on file   Number of children: Not on file   Years of education: Not on file   Highest education level: Not on file  Occupational History    Comment: unemployed  Tobacco Use   Smoking status: Never   Smokeless tobacco: Never  Vaping Use   Vaping Use: Never used  Substance and Sexual Activity   Alcohol use: Not Currently    Comment: occasional -    Drug use: Not Currently    Types: Marijuana    Comment: last use 08/24/18   Sexual activity: Yes    Partners: Male    Birth control/protection: None  Other Topics Concern   Not on file  Social History Narrative   Not on file   Social Determinants of Health   Financial Resource Strain: Not on file  Food Insecurity: Not on file  Transportation Needs: Not on file  Physical Activity: Not on file  Stress: Not on file  Social Connections: Not on file    Labs: Lab Results  Component Value Date   HIV1RNAQUANT 116 (H) 03/24/2021   HIV1RNAQUANT 76,800 (H) 02/18/2021   HIV1RNAQUANT 54,400 (H) 02/03/2021   CD4TABS 93 (L) 03/24/2021   CD4TABS 40 (L) 10/13/2020   CD4TABS 67 (L) 08/21/2019    RPR and STI Lab Results  Component Value Date  LABRPR NON-REACTIVE 02/18/2021   LABRPR NON-REACTIVE 10/13/2020   LABRPR NON REACTIVE 03/26/2019   LABRPR Non Reactive 01/31/2019   LABRPR Non Reactive 10/18/2018    STI Results GC GC CT CT  Latest Ref Rng & Units - NEGATIVE - NEGATIVE  10/13/2020 Negative - Negative -  03/26/2019 Negative - Negative -  10/18/2018 Negative - Negative -  05/25/2018 Negative - **POSITIVE**(A) -  03/28/2017 Negative - Negative -  02/11/2017 Negative - Negative -  07/07/2016 Negative - Negative -  10/27/2015 Negative - Negative -  02/07/2015 Negative - **POSITIVE**(A) -  05/19/2014 NG: Negative - CT: Negative -  12/23/2013 - NEGATIVE - NEGATIVE  05/16/2013 NG: Negative - CT: Negative -  10/22/2012 - NEGATIVE - NEGATIVE  02/12/2012 - NEGATIVE - NEGATIVE    Hepatitis B Lab  Results  Component Value Date   HEPBSAB NON-REACTIVE 09/10/2020   HEPBSAG NON-REACTIVE 09/10/2020   HEPBCAB NON-REACTIVE 09/10/2020   Hepatitis C Lab Results  Component Value Date   HEPCAB NON-REACTIVE 09/10/2020   Hepatitis A Lab Results  Component Value Date   HAV NEG 12/09/2011   Lipids: Lab Results  Component Value Date   CHOL 182 10/08/2020   TRIG 83 10/08/2020   HDL 42 (L) 10/08/2020   CHOLHDL 4.3 10/08/2020   VLDL 9 10/27/2015   LDLCALC 122 (H) 10/08/2020    TARGET DATE:  The 8th of the month  Current HIV Regimen: Cabenuva monthly   Assessment: Kimberly Bishop presents today for their maintenance Cabenuva injections. Initial/past injections were tolerated well without issues. No problems with systemic effects of injections. Patient did experience mild soreness for a couple of days. Will check HIV RNA today.   Administered cabotegravir 692m/3mL in left upper outer quadrant of the gluteal muscle. Administered rilpivirine 900 mg/38min the right upper outer quadrant of the gluteal muscle. Monitored patient for 10 minutes after injection. Injections were tolerated well without issue. Patient will follow up in 2 months for next injection.  Patient deferred immunizations today. Due for annual flut vaccine as well as primary COVID series. Patient should receive Prevnar20 at her next office visit.   Plan: - Cabenuva injections administered - Check HIV RNA - Next injections scheduled for 3/8 with Cassie - Call with any issues or questions  AmAlfonse SprucePharmD, CPP Clinical Pharmacist Practitioner [facility]

## 2021-04-24 LAB — HIV-1 RNA QUANT-NO REFLEX-BLD
HIV 1 RNA Quant: 130 Copies/mL — ABNORMAL HIGH
HIV-1 RNA Quant, Log: 2.11 Log cps/mL — ABNORMAL HIGH

## 2021-05-11 ENCOUNTER — Other Ambulatory Visit (HOSPITAL_COMMUNITY): Payer: Self-pay

## 2021-05-12 ENCOUNTER — Telehealth: Payer: Self-pay

## 2021-05-12 NOTE — Telephone Encounter (Signed)
[facility] Patient Advocate Encounter ? ?Patient's medication (cabenuva 400-600mg ) have been couriered to [facility] from [place] and will be administered on the patient next office visit on 05/19/21. ? ?[name] , CPhT ?Specialty Pharmacy Patient Advocate ?[facility] ?Phone: [phone] ?Fax:  [phone]  ?

## 2021-05-19 ENCOUNTER — Other Ambulatory Visit: Payer: Self-pay

## 2021-05-19 ENCOUNTER — Encounter: Payer: 59 | Admitting: Pharmacist

## 2021-05-19 ENCOUNTER — Ambulatory Visit (INDEPENDENT_AMBULATORY_CARE_PROVIDER_SITE_OTHER): Payer: 59 | Admitting: Pharmacist

## 2021-05-19 DIAGNOSIS — Z21 Asymptomatic human immunodeficiency virus [HIV] infection status: Secondary | ICD-10-CM

## 2021-05-19 DIAGNOSIS — Z23 Encounter for immunization: Secondary | ICD-10-CM | POA: Diagnosis not present

## 2021-05-19 DIAGNOSIS — B20 Human immunodeficiency virus [HIV] disease: Secondary | ICD-10-CM

## 2021-05-19 MED ORDER — CABOTEGRAVIR & RILPIVIRINE ER 400 & 600 MG/2ML IM SUER
1.0000 | Freq: Once | INTRAMUSCULAR | Status: AC
  Administered 2021-05-19: 1 via INTRAMUSCULAR

## 2021-05-19 NOTE — Progress Notes (Signed)
? ?HPI: Kimberly Bishop is a 31 y.o. female who presents to the [facility] clinic for [place] administration. ? ?Patient Active Problem List  ? Diagnosis Date Noted  ? Healthcare maintenance 01/18/2021  ? Nephrolithiasis 01/18/2021  ? AIDS (acquired immunodeficiency syndrome), CD4 <=200 ([place])   ? HIV (human immunodeficiency virus infection) ([place]) 03/29/2019  ? CIN I (cervical intraepithelial neoplasia I) 11/28/2018  ? ASCUS with positive high risk HPV cervical 11/26/2018  ? Molluscum contagiosum 03/22/2016  ? ? ?Patient's Medications  ?New Prescriptions  ? No medications on file  ?Previous Medications  ? BLOOD PRESSURE MONITORING DEVI    1 Device by Does not apply route once a week. ICD 10: O09.90  ? CABOTEGRAVIR & RILPIVIRINE ER (CABENUVA) 400 & 600 MG/2ML INJECTION    Inject 1 kit into the muscle every 30 (thirty) days.  ? DOLUTEGRAVIR (TIVICAY) 50 MG TABLET    Take 1 tablet (50 mg total) by mouth daily.  ? EMTRICITABINE-TENOFOVIR AF (DESCOVY) 200-25 MG TABLET    Take 1 tablet by mouth daily.  ? MIRTAZAPINE (REMERON) 15 MG TABLET    Take 1 tablet (15 mg total) by mouth at bedtime.  ? PROMETHAZINE (PHENERGAN) 25 MG TABLET    Take 1 tablet (25 mg total) by mouth every 6 (six) hours as needed for nausea or vomiting.  ? SULFAMETHOXAZOLE-TRIMETHOPRIM (BACTRIM) 400-80 MG TABLET    Take 1 tablet by mouth daily.  ? TRIAMCINOLONE OINTMENT (KENALOG) 0.5 %    Apply 1 application topically 2 (two) times daily.  ?Modified Medications  ? No medications on file  ?Discontinued Medications  ? No medications on file  ? ? ?Allergies: ?No Known Allergies ? ?Past Medical History: ?Past Medical History:  ?Diagnosis Date  ? AIDS (acquired immunodeficiency syndrome), CD4 <=200 ([place])   ? History of kidney stones   ? HIV (human immunodeficiency virus infection) ([place])   ? Hypertension   ? Immune deficiency disorder ([place])   ? Infection   ? UTI  ? Kidney stones   ? ? ?Social History: ?Social History  ? ?Socioeconomic History  ?  Marital status: Single  ?  Spouse name: Not on file  ? Number of children: Not on file  ? Years of education: Not on file  ? Highest education level: Not on file  ?Occupational History  ?  Comment: unemployed  ?Tobacco Use  ? Smoking status: Never  ? Smokeless tobacco: Never  ?Vaping Use  ? Vaping Use: Never used  ?Substance and Sexual Activity  ? Alcohol use: Not Currently  ?  Comment: occasional -   ? Drug use: Not Currently  ?  Types: Marijuana  ?  Comment: last use 08/24/18  ? Sexual activity: Yes  ?  Partners: Male  ?  Birth control/protection: None  ?Other Topics Concern  ? Not on file  ?Social History Narrative  ? Not on file  ? ?Social Determinants of Health  ? ?Financial Resource Strain: Not on file  ?Food Insecurity: Not on file  ?Transportation Needs: Not on file  ?Physical Activity: Not on file  ?Stress: Not on file  ?Social Connections: Not on file  ? ? ?Labs: ?Lab Results  ?Component Value Date  ? HIV1RNAQUANT 130 (H) 04/22/2021  ? HIV1RNAQUANT 116 (H) 03/24/2021  ? HIV1RNAQUANT 76,800 (H) 02/18/2021  ? CD4TABS 93 (L) 03/24/2021  ? CD4TABS 40 (L) 10/13/2020  ? CD4TABS 67 (L) 08/21/2019  ? ? ?RPR and STI ?Lab Results  ?Component Value Date  ?  LABRPR NON-REACTIVE 02/18/2021  ? LABRPR NON-REACTIVE 10/13/2020  ? LABRPR NON REACTIVE 03/26/2019  ? LABRPR Non Reactive 01/31/2019  ? LABRPR Non Reactive 10/18/2018  ? ? ?STI Results GC GC CT CT  ?Latest Ref Rng & Units - NEGATIVE - NEGATIVE  ?10/13/2020 Negative - Negative -  ?03/26/2019 Negative - Negative -  ?10/18/2018 Negative - Negative -  ?05/25/2018 Negative - **POSITIVE**(A) -  ?03/28/2017 Negative - Negative -  ?02/11/2017 Negative - Negative -  ?07/07/2016 Negative - Negative -  ?10/27/2015 Negative - Negative -  ?02/07/2015 Negative - **POSITIVE**(A) -  ?05/19/2014 NG: Negative - CT: Negative -  ?12/23/2013 - NEGATIVE - NEGATIVE  ?05/16/2013 NG: Negative - CT: Negative -  ?10/22/2012 - NEGATIVE - NEGATIVE  ?02/12/2012 - NEGATIVE - NEGATIVE  ? ? ?Hepatitis B ?Lab  Results  ?Component Value Date  ? HEPBSAB NON-REACTIVE 09/10/2020  ? HEPBSAG NON-REACTIVE 09/10/2020  ? HEPBCAB NON-REACTIVE 09/10/2020  ? ?Hepatitis C ?Lab Results  ?Component Value Date  ? HEPCAB NON-REACTIVE 09/10/2020  ? ?Hepatitis A ?Lab Results  ?Component Value Date  ? HAV NEG 12/09/2011  ? ?Lipids: ?Lab Results  ?Component Value Date  ? CHOL 182 10/08/2020  ? TRIG 83 10/08/2020  ? HDL 42 (L) 10/08/2020  ? CHOLHDL 4.3 10/08/2020  ? VLDL 9 10/27/2015  ? LDLCALC 122 (H) 10/08/2020  ? ? ?TARGET DATE:  The 8th of the month ? ?Current HIV Regimen: ?Cabenuva monthly ? ?Assessment: ?Kimberly Bishop presents today for their maintenance Cabenuva injections. Initial/past injections were tolerated well without issues. No problems with systemic effects of injections. Patient did experience some soreness that subsided in a few days. Will check HIV RNA with reflex and [employer] today.  ? ?Administered cabotegravir 446m/2mL in left upper outer quadrant of the gluteal muscle. Administered rilpivirine 600 mg/262min the right upper outer quadrant of the gluteal muscle. Monitored patient for 10 minutes after injection. Injections were tolerated well without issue. Patient will follow up in 1 months for next injection. ? ?Patient reported no new sexual encounters since last visit and declined STI testing. Patient declined COVID vaccine but did receive Prevnar20 today.  ? ?Plan: ?- Cabenuva injections administered ?- Check HIV RNA with reflex today ?- Genosure archive testing today  ?- Prevnar 20 given today  ?- Next injections scheduled for 06/16/2021 with Cassie ?- Call with any issues or questions ? ?RaJoseph ArtPharm.D. ?PGY-1 Pharmacy Resident ?05/19/2021 3:26 PM  ?

## 2021-05-20 ENCOUNTER — Other Ambulatory Visit (HOSPITAL_COMMUNITY): Payer: Self-pay

## 2021-05-24 LAB — HIV RNA, RTPCR W/R GT (RTI, PI,INT)
HIV 1 RNA Quant: 28 copies/mL — ABNORMAL HIGH
HIV-1 RNA Quant, Log: 1.45 Log copies/mL — ABNORMAL HIGH

## 2021-05-24 NOTE — Progress Notes (Signed)
Better! I got an archive on her just to be sure

## 2021-06-07 ENCOUNTER — Other Ambulatory Visit (HOSPITAL_COMMUNITY): Payer: Self-pay

## 2021-06-08 ENCOUNTER — Telehealth: Payer: Self-pay

## 2021-06-08 NOTE — Telephone Encounter (Signed)
[facility] Patient Advocate Encounter ? ?Patient's medication (Cabenuva 400 & '600mg'$ ) have been couriered to [facility] from [facility] and will be administered on the patient next office visit on 06/16/21. ? ?[name] , CPhT ?Specialty Pharmacy Patient Advocate ?[facility] ?Phone: [phone] ?Fax:  [phone]  ?

## 2021-06-14 ENCOUNTER — Telehealth: Payer: Self-pay | Admitting: Infectious Disease

## 2021-06-14 NOTE — Telephone Encounter (Signed)
Genoure archive results back ? ?NO NNRTI mutations here ? ?AZT R ? ? ? ? ?

## 2021-06-14 NOTE — Telephone Encounter (Signed)
Awesome! Thank you!

## 2021-06-16 ENCOUNTER — Encounter: Payer: Self-pay | Admitting: Pharmacist

## 2021-06-16 ENCOUNTER — Encounter: Payer: 59 | Admitting: Pharmacist

## 2021-06-22 ENCOUNTER — Ambulatory Visit (INDEPENDENT_AMBULATORY_CARE_PROVIDER_SITE_OTHER): Payer: 59 | Admitting: Pharmacist

## 2021-06-22 ENCOUNTER — Other Ambulatory Visit: Payer: Self-pay

## 2021-06-22 DIAGNOSIS — B20 Human immunodeficiency virus [HIV] disease: Secondary | ICD-10-CM | POA: Diagnosis not present

## 2021-06-22 MED ORDER — CABOTEGRAVIR & RILPIVIRINE ER 400 & 600 MG/2ML IM SUER
1.0000 | Freq: Once | INTRAMUSCULAR | Status: AC
  Administered 2021-06-22: 1 via INTRAMUSCULAR

## 2021-06-22 NOTE — Progress Notes (Signed)
? ?HPI: Kimberly Bishop is a 31 y.o. female who presents to the [facility] clinic for [place] administration. ? ?Patient Active Problem List  ? Diagnosis Date Noted  ? Healthcare maintenance 01/18/2021  ? Nephrolithiasis 01/18/2021  ? AIDS (acquired immunodeficiency syndrome), CD4 <=200 ([place])   ? HIV (human immunodeficiency virus infection) ([place]) 03/29/2019  ? CIN I (cervical intraepithelial neoplasia I) 11/28/2018  ? ASCUS with positive high risk HPV cervical 11/26/2018  ? Molluscum contagiosum 03/22/2016  ? ? ?Patient's Medications  ?New Prescriptions  ? No medications on file  ?Previous Medications  ? BLOOD PRESSURE MONITORING DEVI    1 Device by Does not apply route once a week. ICD 10: O09.90  ? CABOTEGRAVIR & RILPIVIRINE ER (CABENUVA) 400 & 600 MG/2ML INJECTION    Inject 1 kit into the muscle every 30 (thirty) days.  ? MIRTAZAPINE (REMERON) 15 MG TABLET    Take 1 tablet (15 mg total) by mouth at bedtime.  ? PROMETHAZINE (PHENERGAN) 25 MG TABLET    Take 1 tablet (25 mg total) by mouth every 6 (six) hours as needed for nausea or vomiting.  ? SULFAMETHOXAZOLE-TRIMETHOPRIM (BACTRIM) 400-80 MG TABLET    Take 1 tablet by mouth daily.  ? TRIAMCINOLONE OINTMENT (KENALOG) 0.5 %    Apply 1 application topically 2 (two) times daily.  ?Modified Medications  ? No medications on file  ?Discontinued Medications  ? DOLUTEGRAVIR (TIVICAY) 50 MG TABLET    Take 1 tablet (50 mg total) by mouth daily.  ? EMTRICITABINE-TENOFOVIR AF (DESCOVY) 200-25 MG TABLET    Take 1 tablet by mouth daily.  ? ? ?Allergies: ?No Known Allergies ? ?Past Medical History: ?Past Medical History:  ?Diagnosis Date  ? AIDS (acquired immunodeficiency syndrome), CD4 <=200 ([place])   ? History of kidney stones   ? HIV (human immunodeficiency virus infection) ([place])   ? Hypertension   ? Immune deficiency disorder ([place])   ? Infection   ? UTI  ? Kidney stones   ? ? ?Social History: ?Social History  ? ?Socioeconomic History  ? Marital status: Single  ?   Spouse name: Not on file  ? Number of children: Not on file  ? Years of education: Not on file  ? Highest education level: Not on file  ?Occupational History  ?  Comment: unemployed  ?Tobacco Use  ? Smoking status: Never  ? Smokeless tobacco: Never  ?Vaping Use  ? Vaping Use: Never used  ?Substance and Sexual Activity  ? Alcohol use: Not Currently  ?  Comment: occasional -   ? Drug use: Not Currently  ?  Types: Marijuana  ?  Comment: last use 08/24/18  ? Sexual activity: Yes  ?  Partners: Male  ?  Birth control/protection: None  ?Other Topics Concern  ? Not on file  ?Social History Narrative  ? Not on file  ? ?Social Determinants of Health  ? ?Financial Resource Strain: Not on file  ?Food Insecurity: Not on file  ?Transportation Needs: Not on file  ?Physical Activity: Not on file  ?Stress: Not on file  ?Social Connections: Not on file  ? ? ?Labs: ?Lab Results  ?Component Value Date  ? HIV1RNAQUANT 28 (H) 05/19/2021  ? HIV1RNAQUANT 130 (H) 04/22/2021  ? HIV1RNAQUANT 116 (H) 03/24/2021  ? CD4TABS 93 (L) 03/24/2021  ? CD4TABS 40 (L) 10/13/2020  ? CD4TABS 67 (L) 08/21/2019  ? ? ?RPR and STI ?Lab Results  ?Component Value Date  ? LABRPR NON-REACTIVE 02/18/2021  ? LABRPR  NON-REACTIVE 10/13/2020  ? LABRPR NON REACTIVE 03/26/2019  ? LABRPR Non Reactive 01/31/2019  ? LABRPR Non Reactive 10/18/2018  ? ? ?STI Results GC GC CT CT  ?Latest Ref Rng & Units  NEGATIVE  NEGATIVE  ?10/13/2020 ? 9:03 AM Negative    Negative     ?03/26/2019 ?12:43 PM Negative    Negative     ?10/18/2018 ?12:00 AM Negative    Negative     ?05/25/2018 ?12:00 AM Negative    **POSITIVE**     ?03/28/2017 ?12:00 AM Negative    Negative     ?02/11/2017 ?12:00 AM Negative    Negative     ?07/07/2016 ?12:00 AM Negative    Negative     ?10/27/2015 ?12:00 AM Negative    Negative     ?02/07/2015 ?12:00 AM Negative    **POSITIVE**     ?05/19/2014 ?12:00 AM NG: Negative    CT: Negative     ?12/23/2013 ? 3:05 PM  NEGATIVE    NEGATIVE    ?05/16/2013 ?12:00 AM NG: Negative    CT:  Negative     ?10/22/2012 ? 5:48 PM  NEGATIVE    NEGATIVE    ?02/12/2012 ?10:52 PM  NEGATIVE    NEGATIVE    ? ? ?Hepatitis B ?Lab Results  ?Component Value Date  ? HEPBSAB NON-REACTIVE 09/10/2020  ? HEPBSAG NON-REACTIVE 09/10/2020  ? HEPBCAB NON-REACTIVE 09/10/2020  ? ?Hepatitis C ?Lab Results  ?Component Value Date  ? HEPCAB NON-REACTIVE 09/10/2020  ? ?Hepatitis A ?Lab Results  ?Component Value Date  ? HAV NEG 12/09/2011  ? ?Lipids: ?Lab Results  ?Component Value Date  ? CHOL 182 10/08/2020  ? TRIG 83 10/08/2020  ? HDL 42 (L) 10/08/2020  ? CHOLHDL 4.3 10/08/2020  ? VLDL 9 10/27/2015  ? LDLCALC 122 (H) 10/08/2020  ? ? ?TARGET DATE: ?8th (monthly) ? ?Assessment: ?Sita presents today for their monthly maintenance Cabenuva injections. Past injections were tolerated well without issues. She reports experiencing some soreness with knots that lasts a couple of days with past injections but denies pain. No problems with systemic side effects of injections.  ? ?Administered cabotegravir 470m/2mL in left upper outer quadrant of the gluteal muscle. Administered rilpivirine 600 mg/287min the right upper outer quadrant of the gluteal muscle. Injections were tolerated well. Patient will follow up in 1 month for next set of injections. ? ?Discussed with patient last HIV RNA and CD4 lab results and how numbers are improving. Patient reports not currently taking Bactrim. Will check HIV-1 RNA and CD4 count today.  ? ?Plan: ?- Cabenuva injections administered ?- Get HIV RNA and CD4 today ?- Next injections scheduled for 07/15/21 with StJanene Madeira?- Call with any issues or questions ? ?VaMarlowe AltPharmD Candidate ?06/22/2021 2:23 PM ? ?

## 2021-06-23 LAB — T-HELPER CELL (CD4) - (RCID CLINIC ONLY)
CD4 % Helper T Cell: 11 % — ABNORMAL LOW (ref 33–65)
CD4 T Cell Abs: 173 /uL — ABNORMAL LOW (ref 400–1790)

## 2021-06-27 LAB — HIV-1 RNA QUANT-NO REFLEX-BLD
HIV 1 RNA Quant: 122 copies/mL — ABNORMAL HIGH
HIV-1 RNA Quant, Log: 2.09 Log copies/mL — ABNORMAL HIGH

## 2021-06-28 ENCOUNTER — Telehealth: Payer: Self-pay | Admitting: Infectious Disease

## 2021-06-28 NOTE — Progress Notes (Signed)
CD4 is up which is good... viral load kinda fluctuating.

## 2021-06-28 NOTE — Telephone Encounter (Signed)
[place] I believe I already scanned in ? ? ? ?

## 2021-07-07 ENCOUNTER — Other Ambulatory Visit (HOSPITAL_COMMUNITY): Payer: Self-pay

## 2021-07-09 ENCOUNTER — Telehealth: Payer: Self-pay

## 2021-07-09 NOTE — Telephone Encounter (Signed)
[facility] Patient Advocate Encounter ? ?Patient's medication (Cabenuva 400/'600mg'$ ) have been couriered to [facility] from [facility] and will be administered on the patient next office visit on 07/15/21. ? ?[name] , CPhT ?Specialty Pharmacy Patient Advocate ?[facility] ?Phone: [phone] ?Fax:  [phone]  ?

## 2021-07-14 ENCOUNTER — Encounter: Payer: Self-pay | Admitting: Pharmacist

## 2021-07-15 ENCOUNTER — Encounter: Payer: 59 | Admitting: Infectious Diseases

## 2021-07-16 ENCOUNTER — Encounter: Payer: 59 | Admitting: Infectious Diseases

## 2021-07-20 ENCOUNTER — Other Ambulatory Visit (HOSPITAL_COMMUNITY): Payer: Self-pay

## 2021-07-20 ENCOUNTER — Ambulatory Visit (INDEPENDENT_AMBULATORY_CARE_PROVIDER_SITE_OTHER): Payer: 59 | Admitting: Infectious Diseases

## 2021-07-20 ENCOUNTER — Encounter: Payer: Self-pay | Admitting: Infectious Diseases

## 2021-07-20 ENCOUNTER — Other Ambulatory Visit: Payer: Self-pay

## 2021-07-20 VITALS — BP 126/86 | HR 79 | Temp 98.0°F | Ht 62.0 in | Wt 132.0 lb

## 2021-07-20 DIAGNOSIS — B20 Human immunodeficiency virus [HIV] disease: Secondary | ICD-10-CM | POA: Diagnosis not present

## 2021-07-20 DIAGNOSIS — R87610 Atypical squamous cells of undetermined significance on cytologic smear of cervix (ASC-US): Secondary | ICD-10-CM

## 2021-07-20 DIAGNOSIS — R87810 Cervical high risk human papillomavirus (HPV) DNA test positive: Secondary | ICD-10-CM

## 2021-07-20 MED ORDER — CABOTEGRAVIR & RILPIVIRINE ER 400 & 600 MG/2ML IM SUER
1.0000 | Freq: Once | INTRAMUSCULAR | Status: AC
  Administered 2021-07-20: 1 via INTRAMUSCULAR

## 2021-07-20 NOTE — Progress Notes (Signed)
? ?Name: Kimberly Bishop  ?DOB: 1991-03-11 ?MRN: [id number] ?PCP: Patient, No Pcp Per (Inactive)  ? ? ?Patient Active Problem List  ? Diagnosis Date Noted  ? Healthcare maintenance 01/18/2021  ? Nephrolithiasis 01/18/2021  ? 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 HIV (human immunodeficiency virus infection) ([place]) 03/29/2019  ? CIN I (cervical intraepithelial neoplasia I) 11/28/2018  ? ASCUS with positive high risk HPV cervical 11/26/2018  ? Molluscum contagiosum 03/22/2016  ?  ? ?Brief Narrative:  ?Kimberly Bishop is a 31 y.o. 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 Started on some medications she thinks early adolescence but not quite clear. CD4 nadir < 50. No history of opportunistic infections to her knowledge aside from oral candidiasis.  ? ?Previous Regimens: ?Tivicay + Odefsey ?Biktarvy ?Tivicay + Truvada (pregnancy 10-2018) ?Cabenuva injections 02/2021 ? ? ?Genotypes: ?3/23 - abacavir, zidovudine resistance. No NNRTI or INSTI resistance  ? ?Subjective:  ? ?Chief Complaint  ?Patient presents with  ? Follow-up  ?  ? ?HPI: ?Doing well on cabenuva injections. Only some mild soreness at the injection site. Hopeful to decrease frequency of the shots one day soon. Wants to go over the resistance report.  ? ?Plans for future children but not presently. Maintained on LA contraception with Nexplanon. Menses are suppressed on this. H/O abnormal pap smear in the past during pregnancy but has not had opportunity to follow up since. ? ?Eating and sleeping well - tired with her 2.5 yo daughter but overall feels a lot better. Wonders if anything else can be done to help with immune system improvement. Married recently! ?Her husband has been reluctant to get HIV screening - we spent time discussing this today.  ? ? ? ?  07/20/2021  ? 10:07 AM  ?Depression screen PHQ 2/9  ?Decreased Interest 0  ?Down, Depressed, Hopeless 0  ?PHQ - 2 Score 0  ? ? ?Review of Systems  ?Constitutional:  Negative  for chills and fever.  ?Respiratory:  Negative for cough.   ?Cardiovascular: Negative.   ?Gastrointestinal: Negative.   ?Genitourinary: Negative.   ?Musculoskeletal: Negative.   ?Skin:  Negative for rash.  ?Neurological: Negative.   ? ?Past Medical History:  ?Diagnosis Date  ? 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 History of kidney stones   ? HIV (human immunodeficiency virus infection) ([place])   ? Hypertension   ? Immune deficiency disorder ([place])   ? Infection   ? UTI  ? Kidney stones   ? ? ?Outpatient Medications Prior to Visit  ?Medication Sig Dispense Refill  ? Blood Pressure Monitoring DEVI 1 Device by Does not apply route once a week. ICD 10: O09.90 1 Device 0  ? cabotegravir & rilpivirine ER (CABENUVA) 400 & 600 MG/2ML injection Inject 1 kit into the muscle every 30 (thirty) days. 4 mL 5  ? mirtazapine (REMERON) 15 MG tablet Take 1 tablet (15 mg total) by mouth at bedtime. 30 tablet 0  ? promethazine (PHENERGAN) 25 MG tablet Take 1 tablet (25 mg total) by mouth every 6 (six) hours as needed for nausea or vomiting. 120 tablet 4  ? sulfamethoxazole-trimethoprim (BACTRIM) 400-80 MG tablet Take 1 tablet by mouth daily. 30 tablet 5  ? triamcinolone ointment (KENALOG) 0.5 % Apply 1 application topically 2 (two) times daily. 30 g 2  ? ?No facility-administered medications prior to visit.  ?  ? ?No Known Allergies ? ?Social History  ? ?Tobacco Use  ? Smoking status: Never  ?  Smokeless tobacco: Never  ?Vaping Use  ? Vaping Use: Never used  ?Substance Use Topics  ? Alcohol use: Not Currently  ?  Comment: occasional -   ? Drug use: Not Currently  ?  Types: Marijuana  ?  Comment: last use 08/24/18  ? ? ?Family History  ?Adopted: Yes  ?Problem Relation Age of Onset  ? Hypertension Mother   ? Autism Brother   ? ? ?Social History  ? ?Substance and Sexual Activity  ?Sexual Activity Yes  ? Partners: Male  ? Birth control/protection: None, Implant  ? Comment: declined condoms  ? ? ? ?Objective:   ? ?Vitals:  ? 07/20/21 1005  ?BP: 126/86  ?Pulse: 79  ?Temp: 98 ?F (36.7 ?C)  ?TempSrc: Oral  ?Weight: 132 lb (59.9 kg)  ?Height: '5\' 2"'  (1.575 m)  ? ?Body mass index is 24.14 kg/m?. ? ?Physical Exam ?Constitutional:   ?   Appearance: Normal appearance. She is not ill-appearing.  ?HENT:  ?   Mouth/Throat:  ?   Mouth: Mucous membranes are moist.  ?   Pharynx: Oropharynx is clear.  ?Eyes:  ?   General: No scleral icterus. ?Cardiovascular:  ?   Rate and Rhythm: Normal rate and regular rhythm.  ?Pulmonary:  ?   Effort: Pulmonary effort is normal.  ?Neurological:  ?   Mental Status: She is oriented to person, place, and time.  ?Psychiatric:     ?   Mood and Affect: Mood normal.     ?   Thought Content: Thought content normal.  ? ? ?Lab Results ?Lab Results  ?Component Value Date  ? WBC 4.2 02/03/2021  ? HGB 11.3 (L) 02/03/2021  ? HCT 34.4 (L) 02/03/2021  ? MCV 79.4 (L) 02/03/2021  ? PLT 365 02/03/2021  ?  ?Lab Results  ?Component Value Date  ? CREATININE 0.87 02/03/2021  ? BUN 8 02/03/2021  ? NA 138 02/03/2021  ? K 3.4 (L) 02/03/2021  ? CL 107 02/03/2021  ? CO2 21 02/03/2021  ?  ?Lab Results  ?Component Value Date  ? ALT 33 (H) 02/03/2021  ? AST 30 02/03/2021  ? ALKPHOS 183 (H) 03/29/2019  ? BILITOT 0.4 02/03/2021  ?  ?Lab Results  ?Component Value Date  ? CHOL 182 10/08/2020  ? HDL 42 (L) 10/08/2020  ? LDLCALC 122 (H) 10/08/2020  ? TRIG 83 10/08/2020  ? CHOLHDL 4.3 10/08/2020  ? ?HIV 1 RNA Quant  ?Date Value  ?06/22/2021 122 copies/mL (H)  ?05/19/2021 28 copies/mL (H)  ?04/22/2021 130 Copies/mL (H)  ? ?CD4 T Cell Abs (/uL)  ?Date Value  ?06/22/2021 173 (L)  ?03/24/2021 93 (L)  ?10/13/2020 40 (L)  ? ? ? ?Assessment & Plan:  ? ?Problem List Items Addressed This Visit   ? ?  ? Unprioritized  ? HIV (human immunodeficiency virus infection) ([place]) (Chronic)  ?  Continues on monthly cabenuva injections. Variable VLs between 30-130 without any evidence of resistance per March 2023 Archive. We discussed this today. Typical  pattern of NRTI resistance with prior adolescent/childhood medication use.  ?We discussed adding lenacapivir SQ q20mto see if these will help keep her more reliably suppressed and hopefully allow u[place]ato drop back to q281mM injections of Cabenuva.  ?Explained that this is an off-label use of the medication and we will monitor her closely - would like to proceed.  ?Counseled that we are not sure how these mediations work in the setting of pregnancy - more information over time will likely  accumulate through real world data, but at this time I recommend she continue on contraception, she agrees and not quite ready for expanding family just yet.  ?RTC 74mfor further injections.  ?VL, CD4, CMP and Vitamin D today.  ? ?  ?  ? 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  Showing signs of recovery clinically and with labs. Last CD4 up to nearly 200 now!  ?Will re-assess TCells today then drop back to every 3 months.  ?Will check vitamin D to see if adding weekly supplement helps with fatigue component but counseled that the best thing she can do is continue to take her medication, eat well, sleep well and stress less.  ?Counseled it will eventually come to a new nadir but the largest risk reduction is keeping the viral load down.  ? ?  ?  ? ASCUS with positive high risk HPV cervical  ?  Will repeat co-testing in 170mnd refer to GYN for further management if indicated; also will need discussion about plans for ongoing contraception as I think Nexplanon is only 3 year duration.  ? ?  ?  ? ?Other Visit Diagnoses   ? ? HIV disease ([place]   -  Primary  ? Relevant Medications  ? cabotegravir & rilpivirine ER (CABENUVA) 400 & 600 MG/2ML injection 1 kit (Completed)  ? Other Relevant Orders  ? [facility]-helper [facility] (CD4) [facility] ? Vitamin D (25 hydroxy)  ? HIV 1 RNA quant-no reflex-bld  ? COMPLETE METABOLIC PANEL WITH GFR  ? ?  ? ?StJanene MadeiraMSN, NP-C ?[facility] ?[facility] ?Pager: [phone]: [phone] ? ?07/20/21  ?11:16 AM ? ? ?

## 2021-07-20 NOTE — Assessment & Plan Note (Signed)
Showing signs of recovery clinically and with labs. Last CD4 up to nearly 200 now!  ?Will re-assess TCells today then drop back to every 3 months.  ?Will check vitamin D to see if adding weekly supplement helps with fatigue component but counseled that the best thing she can do is continue to take her medication, eat well, sleep well and stress less.  ?Counseled it will eventually come to a new nadir but the largest risk reduction is keeping the viral load down.  ?

## 2021-07-20 NOTE — Assessment & Plan Note (Signed)
Continues on monthly cabenuva injections. Variable VLs between 30-130 without any evidence of resistance per March 2023 Archive. We discussed this today. Typical pattern of NRTI resistance with prior adolescent/childhood medication use.  ?We discussed adding lenacapivir SQ q747mto see if these will help keep her more reliably suppressed and hopefully allow u[place]oreato drop back to q276mM injections of Cabenuva.  ?Explained that this is an off-label use of the medication and we will monitor her closely - would like to proceed.  ?Counseled that we are not sure how these mediations work in the setting of pregnancy - more information over time will likely accumulate through real world data, but at this time I recommend she continue on contraception, she agrees and not quite ready for expanding family just yet.  ?RTC 47m59mr further injections.  ?VL, CD4, CMP and Vitamin D today.  ?

## 2021-07-20 NOTE — Assessment & Plan Note (Signed)
Will repeat co-testing in 66mand refer to GYN for further management if indicated; also will need discussion about plans for ongoing contraception as I think Nexplanon is only 3 year duration.  ?

## 2021-07-20 NOTE — Patient Instructions (Addendum)
Nice to see you!  ? ?Will look into the addition of that every 6 month shot as another effort to get your viral load down consistently < 20. It may also give [place] some better reassurance to transition you to the every 2 month butt injections sooner.  ? ?So far no resistance was seen that would affect this medication from your test in March. Will keep a close eye on this for you.  ? ? ?Your next visit is scheduled in 08/18/2021 with me in the afternoon. We will plan to do your pap smear screening as well at this visit.  ?

## 2021-07-21 ENCOUNTER — Encounter (INDEPENDENT_AMBULATORY_CARE_PROVIDER_SITE_OTHER): Payer: 59

## 2021-07-21 ENCOUNTER — Other Ambulatory Visit (HOSPITAL_COMMUNITY): Payer: Self-pay

## 2021-07-21 DIAGNOSIS — B20 Human immunodeficiency virus [HIV] disease: Secondary | ICD-10-CM | POA: Diagnosis not present

## 2021-07-21 DIAGNOSIS — E559 Vitamin D deficiency, unspecified: Secondary | ICD-10-CM

## 2021-07-21 LAB — T-HELPER CELLS (CD4) COUNT (NOT AT ARMC)
CD4 % Helper T Cell: 11 % — ABNORMAL LOW (ref 33–65)
CD4 T Cell Abs: 161 /uL — ABNORMAL LOW (ref 400–1790)

## 2021-07-21 MED ORDER — VITAMIN D (ERGOCALCIFEROL) 1.25 MG (50000 UNIT) PO CAPS
50000.0000 [IU] | ORAL_CAPSULE | ORAL | 0 refills | Status: DC
  Filled 2021-07-21: qty 8, 56d supply, fill #0

## 2021-07-21 NOTE — Telephone Encounter (Signed)
Here's our little blip: Counseled that patient will take two Sunlenca 300 mg tablets (600 mg total) orally on day of injection and will take two Sunlenca 300 mg tablets (600 mg total) orally the following day. Sunlenca can be taken with or without food. Counseled patient that Victorio Palm is two separate subcutaneous injections in the abdomen every 6 months. (FYI: It is 1.5 mL in each syringe!) hope this helps, Estill Bamberg

## 2021-07-22 DIAGNOSIS — E559 Vitamin D deficiency, unspecified: Secondary | ICD-10-CM | POA: Insufficient documentation

## 2021-07-22 MED ORDER — LENACAPAVIR SODIUM 4 X 300 MG PO TBPK: 2.0000 | ORAL_TABLET | ORAL | 0 refills | Status: DC

## 2021-07-22 MED ORDER — SUNLENCA 463.5 MG/1.5ML ~~LOC~~ SOLN: 927.0000 mg | SUBCUTANEOUS | 1 refills | Status: DC

## 2021-07-22 NOTE — Telephone Encounter (Signed)
Please see the MyChart message reply(ies) for my assessment and plan.  ?  ?This patient gave consent for this Medical Advice Message and is aware that it may result in a bill to [employer], as well as the possibility of receiving a bill for a co-payment or deductible. They are an established patient, but are not seeking medical advice exclusively about a problem treated during an in person or video visit in the last seven days. I did not recommend an in person or video visit within seven days of my reply.  ?  ?I spent a total of 20 minutes cumulative time within 7 days through [employer] including new medication for a newly identified problem, counseling on medication administration through multiple messages over MyChart.  ? ?Problem List Items Addressed This Visit   ? ?  ? Unprioritized  ? HIV (human immunodeficiency virus infection) ([place]) - Primary (Chronic)  ? Relevant Medications  ? lenacapavir (SUNLENCA) 300 mg tablet  ? SQ injection lenacapavir (SUNLENCA) 463.5 MG/1.5ML SQ injection  ? Vitamin D deficiency  ? Relevant Medications  ? Vitamin D, Ergocalciferol, (DRISDOL) 1.25 MG (50000 UNIT) CAPS capsule  ? ? ?Janene Madeira, NP  ? ?

## 2021-07-22 NOTE — Addendum Note (Signed)
Addended by: [place] Callas on: 07/22/2021 01:16 PM ? ? Modules accepted: Orders ? ?

## 2021-07-23 LAB — COMPLETE METABOLIC PANEL WITH GFR
AG Ratio: 1.2 (calc) (ref 1.0–2.5)
ALT: 25 U/L (ref 6–29)
AST: 28 U/L (ref 10–30)
Albumin: 4.5 g/dL (ref 3.6–5.1)
Alkaline phosphatase (APISO): 115 U/L (ref 31–125)
BUN/Creatinine Ratio: 13 (calc) (ref 6–22)
BUN: 14 mg/dL (ref 7–25)
CO2: 19 mmol/L — ABNORMAL LOW (ref 20–32)
Calcium: 9.3 mg/dL (ref 8.6–10.2)
Chloride: 109 mmol/L (ref 98–110)
Creat: 1.11 mg/dL — ABNORMAL HIGH (ref 0.50–0.97)
Globulin: 3.8 g/dL (calc) — ABNORMAL HIGH (ref 1.9–3.7)
Glucose, Bld: 86 mg/dL (ref 65–99)
Potassium: 3.7 mmol/L (ref 3.5–5.3)
Sodium: 137 mmol/L (ref 135–146)
Total Bilirubin: 0.6 mg/dL (ref 0.2–1.2)
Total Protein: 8.3 g/dL — ABNORMAL HIGH (ref 6.1–8.1)
eGFR: 69 mL/min/{1.73_m2} (ref >=60)

## 2021-07-23 LAB — VITAMIN D 25 HYDROXY (VIT D DEFICIENCY, FRACTURES): Vit D, 25-Hydroxy: 12 ng/mL — ABNORMAL LOW (ref 30–100)

## 2021-07-23 LAB — HIV-1 RNA QUANT-NO REFLEX-BLD
HIV 1 RNA Quant: 84 copies/mL — ABNORMAL HIGH
HIV-1 RNA Quant, Log: 1.92 Log copies/mL — ABNORMAL HIGH

## 2021-07-30 ENCOUNTER — Encounter (INDEPENDENT_AMBULATORY_CARE_PROVIDER_SITE_OTHER): Payer: 59

## 2021-07-30 ENCOUNTER — Other Ambulatory Visit (HOSPITAL_COMMUNITY): Payer: Self-pay

## 2021-07-30 DIAGNOSIS — B029 Zoster without complications: Secondary | ICD-10-CM

## 2021-07-30 MED ORDER — VALACYCLOVIR HCL 1 G PO TABS
1000.0000 mg | ORAL_TABLET | Freq: Three times a day (TID) | ORAL | 0 refills | Status: AC
  Filled 2021-07-30: qty 21, 7d supply, fill #0

## 2021-07-30 NOTE — Telephone Encounter (Signed)
Please see the MyChart message reply(ies) for my assessment and plan.    This patient gave consent for this Medical Advice Message and is aware that it may result in a bill to [employer], as well as the possibility of receiving a bill for a co-payment or deductible. They are an established patient, but are not seeking medical advice exclusively about a problem treated during an in person or video visit in the last seven days. I did not recommend an in person or video visit within seven days of my reply.    I spent a total of 8 minutes cumulative time within 7 days through [employer] discussing the following NEW medical problems requiring intervention and counseling.       Problem List Items Addressed This Visit   None Visit Diagnoses     Herpes zoster without complication    -  Primary   Relevant Medications   valACYclovir (VALTREX) 1000 MG tablet       Janene Madeira, NP

## 2021-08-06 ENCOUNTER — Other Ambulatory Visit (HOSPITAL_COMMUNITY): Payer: Self-pay

## 2021-08-10 ENCOUNTER — Telehealth: Payer: Self-pay

## 2021-08-10 NOTE — Telephone Encounter (Signed)
[facility] Patient Advocate Encounter  Patient's medication [name]) have been couriered to [facility] from [employer] and will be administered on the patient next office visit on 08/18/21.  [name] , [facility] Patient [facility] for Infectious Disease Phone: [phone] Fax:  [phone]

## 2021-08-18 ENCOUNTER — Ambulatory Visit (INDEPENDENT_AMBULATORY_CARE_PROVIDER_SITE_OTHER): Payer: 59 | Admitting: Infectious Diseases

## 2021-08-18 ENCOUNTER — Other Ambulatory Visit (HOSPITAL_COMMUNITY)
Admission: RE | Admit: 2021-08-18 | Discharge: 2021-08-18 | Disposition: A | Discharge status: Home / Self Care | Payer: 59 | Source: Ambulatory Visit | Attending: Infectious Diseases | Admitting: Infectious Diseases

## 2021-08-18 ENCOUNTER — Other Ambulatory Visit: Payer: Self-pay

## 2021-08-18 ENCOUNTER — Other Ambulatory Visit (HOSPITAL_COMMUNITY): Payer: Self-pay

## 2021-08-18 ENCOUNTER — Encounter: Payer: Self-pay | Admitting: Infectious Diseases

## 2021-08-18 VITALS — BP 123/85 | HR 73 | Temp 98.4°F | Wt 137.0 lb

## 2021-08-18 DIAGNOSIS — R87610 Atypical squamous cells of undetermined significance on cytologic smear of cervix (ASC-US): Secondary | ICD-10-CM

## 2021-08-18 DIAGNOSIS — Z124 Encounter for screening for malignant neoplasm of cervix: Secondary | ICD-10-CM | POA: Diagnosis present

## 2021-08-18 DIAGNOSIS — B20 Human immunodeficiency virus [HIV] disease: Secondary | ICD-10-CM

## 2021-08-18 DIAGNOSIS — R87810 Cervical high risk human papillomavirus (HPV) DNA test positive: Secondary | ICD-10-CM

## 2021-08-18 MED ORDER — CABOTEGRAVIR & RILPIVIRINE ER 400 & 600 MG/2ML IM SUER
1.0000 | Freq: Once | INTRAMUSCULAR | Status: AC
  Administered 2021-08-18: 1 via INTRAMUSCULAR

## 2021-08-18 NOTE — Progress Notes (Signed)
      Subjective :    Kimberly Bishop is a 31 y.o. female here for cabenuva maintenance injections and pap smear.   Review of Systems: Current GYN complaints or concerns: None.  Patient denies any abdominal/pelvic pain, problems with bowel movements, urination, vaginal discharge or intercourse.  Does not cycle menses currently with nexplanon in place.    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 Contraception: Nexplanon Last Pap: 2021. Results were: abnormal Last Mammogram: n/a.     Objective :   Physical Exam - chaperone present  Constitutional: Well developed, well nourished, no acute distress. She is alert and oriented x3.  Pelvic: External genitalia is normal in appearance. The vagina is normal in appearance. The cervix is bulbous and easily visualized. No CMT, normal expected cervical mucus present. Bimanual exam reveals uterus that is felt to be normal size, shape, and contour. No adnexal masses or tenderness noted. Breasts: symmetrical in contour, shape and texture. No palpable masses/nodules. No nipple discharge.  Psych: She has a normal mood and affect.      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 Will try to coordinate with Pharmacy team to get lencapivir injections 2x yearly started. We were unable to coordinate the medication delivery prior to today's visit.  Will hold off on VL today as they have been relatively stable.   RTC with pharmacy in 1 month.        ASCUS with positive high risk HPV cervical    Abnormal pap smears in the past - will re-screen today and refer for colposcopy if indicated. Pelvic exam was normal.        Screening for cervical cancer   Relevant Orders   Cytology - PAP( )   Other Visit Diagnoses     HIV disease ([place])    -  Primary   Relevant Medications   cabotegravir & rilpivirine ER (CABENUVA) 400 & 600 MG/2ML injection 1 kit (Completed)        Janene Madeira, MSN, NP-C [place] Office: [phone] Pager: [phone]  08/18/21 5:08 PM

## 2021-08-18 NOTE — Patient Instructions (Signed)
Please return in 1 month for your injections with our pharmacy team.

## 2021-08-18 NOTE — Assessment & Plan Note (Signed)
Abnormal pap smears in the past - will re-screen today and refer for colposcopy if indicated. Pelvic exam was normal.

## 2021-08-18 NOTE — Assessment & Plan Note (Addendum)
Doing well on monthly cabenuva with low level viremia 80-150 copies. Will try to coordinate with Pharmacy team to get lencapivir injections 2x yearly started. We were unable to coordinate the medication delivery prior to today's visit.  Will hold off on VL today as they have been relatively stable.   RTC with pharmacy in 1 month.

## 2021-08-19 ENCOUNTER — Other Ambulatory Visit: Payer: Self-pay | Admitting: Pharmacist

## 2021-08-19 ENCOUNTER — Encounter: Payer: Self-pay | Admitting: Pharmacist

## 2021-08-19 ENCOUNTER — Other Ambulatory Visit (HOSPITAL_COMMUNITY): Payer: Self-pay

## 2021-08-19 DIAGNOSIS — B20 Human immunodeficiency virus [HIV] disease: Secondary | ICD-10-CM

## 2021-08-19 MED ORDER — LENACAPAVIR SODIUM 4 X 300 MG PO TBPK
2.0000 | ORAL_TABLET | ORAL | 0 refills | Status: DC
  Filled 2021-08-19: qty 4, fill #0
  Filled 2021-08-20: qty 4, 4d supply, fill #0

## 2021-08-19 MED ORDER — SUNLENCA 463.5 MG/1.5ML ~~LOC~~ SOLN
927.0000 mg | SUBCUTANEOUS | 1 refills | Status: DC
  Filled 2021-08-19: qty 3, 180d supply, fill #0
  Filled 2021-08-20: qty 3, 90d supply, fill #0
  Filled 2021-08-24: qty 3, 31d supply, fill #0
  Filled 2021-09-06: qty 3, 180d supply, fill #0
  Filled 2021-09-06: qty 3, 90d supply, fill #0

## 2021-08-20 ENCOUNTER — Other Ambulatory Visit (HOSPITAL_COMMUNITY): Payer: Self-pay

## 2021-08-23 ENCOUNTER — Other Ambulatory Visit (HOSPITAL_COMMUNITY): Payer: Self-pay

## 2021-08-24 ENCOUNTER — Telehealth: Payer: Self-pay

## 2021-08-24 ENCOUNTER — Other Ambulatory Visit (HOSPITAL_COMMUNITY): Payer: Self-pay

## 2021-08-24 LAB — CYTOLOGY - PAP
Adequacy: ABNORMAL
Chlamydia: NEGATIVE
Comment: NEGATIVE
Comment: NEGATIVE
Comment: NORMAL
Neisseria Gonorrhea: NEGATIVE

## 2021-08-24 NOTE — Telephone Encounter (Signed)
[facility] Patient Advocate Encounter   Received notification from [employer] that prior authorization for Sunlenca Injection is required.   PA submitted on 08/24/21 Key [id number] Status is pending    [facility] will continue to follow.   [name], [facility] Patient [facility] Phone: [phone] Fax:  [phone]

## 2021-08-24 NOTE — Telephone Encounter (Signed)
[facility] Patient Advocate Encounter  Patient's medication (Sunlenca Tablets) have been couriered to [facility] from [employer] and will be administered on the patient next office visit on 09/22/21.  [name] , [facility] Patient [facility] for Infectious Disease Phone: [phone] Fax:  [phone]

## 2021-09-01 ENCOUNTER — Other Ambulatory Visit (HOSPITAL_COMMUNITY): Payer: Self-pay

## 2021-09-06 ENCOUNTER — Other Ambulatory Visit (HOSPITAL_COMMUNITY): Payer: Self-pay

## 2021-09-07 ENCOUNTER — Other Ambulatory Visit: Payer: Self-pay | Admitting: Pharmacist

## 2021-09-07 ENCOUNTER — Other Ambulatory Visit (HOSPITAL_COMMUNITY): Payer: Self-pay

## 2021-09-07 DIAGNOSIS — B20 Human immunodeficiency virus [HIV] disease: Secondary | ICD-10-CM

## 2021-09-07 MED ORDER — SUNLENCA 463.5 MG/1.5ML ~~LOC~~ SOLN: 927.0000 mg | SUBCUTANEOUS | 1 refills | Status: DC

## 2021-09-08 ENCOUNTER — Other Ambulatory Visit (HOSPITAL_COMMUNITY): Payer: Self-pay

## 2021-09-13 ENCOUNTER — Other Ambulatory Visit: Payer: Self-pay | Admitting: Pharmacist

## 2021-09-13 DIAGNOSIS — B20 Human immunodeficiency virus [HIV] disease: Secondary | ICD-10-CM

## 2021-09-17 ENCOUNTER — Other Ambulatory Visit: Payer: Self-pay | Admitting: Pharmacist

## 2021-09-17 ENCOUNTER — Other Ambulatory Visit (HOSPITAL_COMMUNITY): Payer: Self-pay

## 2021-09-17 DIAGNOSIS — B20 Human immunodeficiency virus [HIV] disease: Secondary | ICD-10-CM

## 2021-09-17 MED ORDER — CABENUVA 400 & 600 MG/2ML IM SUER
1.0000 | INTRAMUSCULAR | 5 refills | Status: DC
  Filled 2021-09-17 (×2): qty 4, 30d supply, fill #0
  Filled 2021-10-13: qty 4, 30d supply, fill #1

## 2021-09-20 ENCOUNTER — Telehealth: Payer: Self-pay

## 2021-09-20 ENCOUNTER — Other Ambulatory Visit (HOSPITAL_COMMUNITY): Payer: Self-pay

## 2021-09-20 NOTE — Telephone Encounter (Signed)
[facility] Patient Advocate Encounter   Received notification from [employer] that prior authorization for [facility] is required.(Medical Benefits)   PA submitted on 09/20/21 Key [id number] Faxed chart notes and labs to [phone] Phone # [phone] Status is pending    [facility] will continue to follow.   [name], [facility] Patient [facility] for Infectious Disease Phone: [phone] Fax:  [phone]

## 2021-09-20 NOTE — Telephone Encounter (Signed)
[facility] Patient Advocate Encounter  Patient's medication (Cabenuva 400-'600mg'$ ) have been couriered to [facility] from [employer] and will be administered on the patient next office visit on 09/22/21.  [name] , [facility] Patient [facility] for Infectious Disease Phone: [phone] Fax:  [phone]

## 2021-09-22 ENCOUNTER — Ambulatory Visit: Payer: 59 | Admitting: Pharmacist

## 2021-09-22 ENCOUNTER — Encounter: Payer: Self-pay | Admitting: Pharmacist

## 2021-09-24 ENCOUNTER — Ambulatory Visit (INDEPENDENT_AMBULATORY_CARE_PROVIDER_SITE_OTHER): Payer: 59 | Admitting: Pharmacist

## 2021-09-24 ENCOUNTER — Other Ambulatory Visit: Payer: Self-pay

## 2021-09-24 DIAGNOSIS — B20 Human immunodeficiency virus [HIV] disease: Secondary | ICD-10-CM

## 2021-09-24 MED ORDER — CABOTEGRAVIR & RILPIVIRINE ER 400 & 600 MG/2ML IM SUER
1.0000 | Freq: Once | INTRAMUSCULAR | Status: AC
  Administered 2021-09-24: 1 via INTRAMUSCULAR

## 2021-09-24 NOTE — Progress Notes (Signed)
HPI: Kimberly Bishop is a 31 y.o. female who presents to the [place] clinic for [place] administration.  Patient Active Problem List   Diagnosis Date Noted   Screening for cervical cancer 08/18/2021   Vitamin D deficiency 07/22/2021   Healthcare maintenance 01/18/2021   Nephrolithiasis 01/18/2021   AIDS (acquired immunodeficiency syndrome), CD4 <=200 ([place])    HIV (human immunodeficiency virus infection) ([place]) 03/29/2019   CIN I (cervical intraepithelial neoplasia I) 11/28/2018   ASCUS with positive high risk HPV cervical 11/26/2018   Molluscum contagiosum 03/22/2016    Patient's Medications  New Prescriptions   No medications on file  Previous Medications   BLOOD PRESSURE MONITORING DEVI    1 Device by Does not apply route once a week. ICD 10: O09.90   CABOTEGRAVIR & RILPIVIRINE ER (CABENUVA) 400 & 600 MG/2ML INJECTION    Inject 1 kit into the muscle every 30 (thirty) days.   LENACAPAVIR (SUNLENCA) 300 MG TABLET    Take 2 tablets by mouth See admin instructions. This is the 2-day initiation kit: Take two 300-mg tablets on Day 1 & Day 2 with or without food.   MIRTAZAPINE (REMERON) 15 MG TABLET    Take 1 tablet (15 mg total) by mouth at bedtime.   PROMETHAZINE (PHENERGAN) 25 MG TABLET    Take 1 tablet (25 mg total) by mouth every 6 (six) hours as needed for nausea or vomiting.   SQ INJECTION LENACAPAVIR (SUNLENCA) 463.5 MG/1.5ML SQ INJECTION    Inject 3 mLs (927 mg total) into the skin every 6 (six) months. Administer each injection subcutaneously at separate sites in the abdomen (more or equal to 2 inches from the navel).   SULFAMETHOXAZOLE-TRIMETHOPRIM (BACTRIM) 400-80 MG TABLET    Take 1 tablet by mouth daily.   TRIAMCINOLONE OINTMENT (KENALOG) 0.5 %    Apply 1 application topically 2 (two) times daily.   VITAMIN D, ERGOCALCIFEROL, (DRISDOL) 1.25 MG (50000 UNIT) CAPS CAPSULE    Take 1 capsule by mouth every Wednesday  Modified Medications   No medications on file   Discontinued Medications   No medications on file    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 ([place])    History of kidney stones    HIV (human immunodeficiency virus infection) ([place])    Hypertension    Immune deficiency disorder ([place])    Infection    UTI   Kidney stones     Social History: Social History   Socioeconomic History   Marital status: Married    Spouse name: Not on file   Number of children: Not on file   Years of education: Not on file   Highest education level: Not on file  Occupational History    Comment: unemployed  Tobacco Use   Smoking status: Never   Smokeless tobacco: Never  Vaping Use   Vaping Use: Never used  Substance and Sexual Activity   Alcohol use: Not Currently    Comment: occasional -    Drug use: Not Currently    Types: Marijuana    Comment: last use 08/24/18   Sexual activity: Yes    Partners: Male    Birth control/protection: None, Implant    Comment: declined condoms  Other Topics Concern   Not on file  Social History Narrative   Not on file   Social Determinants of Health   Financial Resource Strain: Not on file  Food Insecurity: No Food Insecurity (09/11/2019)   Hunger Vital Sign    Worried About Running Out of Food in the Last Year: Never true    Ran Out of Food in the Last Year: Never true  Transportation Needs: No Transportation Needs (09/11/2019)   PRAPARE - Hydrologist (Medical): No    Lack of Transportation (Non-Medical): No  Physical Activity: Not on file  Stress: Not on file  Social Connections: Not on file    Labs: Lab Results  Component Value Date   HIV1RNAQUANT 84 (H) 07/20/2021   HIV1RNAQUANT 122 (H) 06/22/2021   HIV1RNAQUANT 28 (H) 05/19/2021   CD4TABS 161 (L) 07/20/2021   CD4TABS 173 (L) 06/22/2021   CD4TABS 93 (L) 03/24/2021    RPR and STI Lab Results   Component Value Date   LABRPR NON-REACTIVE 02/18/2021   LABRPR NON-REACTIVE 10/13/2020   LABRPR NON REACTIVE 03/26/2019   LABRPR Non Reactive 01/31/2019   LABRPR Non Reactive 10/18/2018    STI Results GC GC CT CT  Latest Ref Rng & Units  NEGATIVE  NEGATIVE  08/18/2021  4:45 PM Negative   Negative    10/13/2020  9:03 AM Negative   Negative    03/26/2019 12:43 PM Negative   Negative    10/18/2018 12:00 AM Negative   Negative    05/25/2018 12:00 AM Negative   **POSITIVE**    03/28/2017 12:00 AM Negative   Negative    02/11/2017 12:00 AM Negative   Negative    07/07/2016 12:00 AM Negative   Negative    10/27/2015 12:00 AM Negative   Negative    02/07/2015 12:00 AM Negative   **POSITIVE**    05/19/2014 12:00 AM NG: Negative   CT: Negative    12/23/2013  3:05 PM  NEGATIVE   NEGATIVE   05/16/2013 12:00 AM NG: Negative   CT: Negative    10/22/2012  5:48 PM  NEGATIVE   NEGATIVE   02/12/2012 10:52 PM  NEGATIVE   NEGATIVE     Hepatitis B Lab Results  Component Value Date   HEPBSAB NON-REACTIVE 09/10/2020   HEPBSAG NON-REACTIVE 09/10/2020   HEPBCAB NON-REACTIVE 09/10/2020   Hepatitis C Lab Results  Component Value Date   HEPCAB NON-REACTIVE 09/10/2020   Hepatitis A Lab Results  Component Value Date   HAV NEG 12/09/2011   Lipids: Lab Results  Component Value Date   CHOL 182 10/08/2020   TRIG 83 10/08/2020   HDL 42 (L) 10/08/2020   CHOLHDL 4.3 10/08/2020   VLDL 9 10/27/2015   LDLCALC 122 (H) 10/08/2020    TARGET DATE:  The 8th of the month  Current HIV Regimen: Cabenuva  Assessment: Kimberly Bishop presents today for their maintenance Cabenuva injections. Initial/past injections were tolerated well without issues. No problems with systemic effects of injections. Will check HIV RNA today. Still pending Sunlenca.   Administered cabotegravir 637m/3mL in left upper outer quadrant of the gluteal muscle. Administered rilpivirine 900 mg/351min the right upper outer quadrant of the  gluteal muscle. Monitored patient for 10 minutes after injection. Injections were tolerated well without issue. Patient will follow up in 2 months for next injection.  Plan: - Cabenuva injections administered - Next injections scheduled for 8/9 with Cassie  - Call with any issues or questions  AmAlfonse SprucePharmD, CPP, BCLeonardlinical Pharmacist Practitioner [facility]

## 2021-09-27 LAB — HIV-1 RNA QUANT-NO REFLEX-BLD
HIV 1 RNA Quant: NOT DETECTED Copies/mL
HIV-1 RNA Quant, Log: NOT DETECTED Log cps/mL

## 2021-09-28 ENCOUNTER — Other Ambulatory Visit (HOSPITAL_COMMUNITY): Payer: Self-pay

## 2021-09-28 ENCOUNTER — Telehealth: Payer: Self-pay

## 2021-09-28 NOTE — Telephone Encounter (Signed)
[facility] Patient Advocate Encounter  Prior Authorization for Kimberly Bishop has been approved.    PA# [id number] Effective dates: 09/20/21 through 09/21/22  Prescription was sent to [facility] [phone])  They will contact the patient before they will mail the medication to the clinic.  [facility] will continue to follow.  [name], [facility] Patient [facility] for Infectious Disease Phone: [phone] Fax:  [phone]

## 2021-10-01 ENCOUNTER — Other Ambulatory Visit (HOSPITAL_COMMUNITY): Payer: Self-pay

## 2021-10-04 ENCOUNTER — Telehealth: Payer: Self-pay

## 2021-10-04 ENCOUNTER — Other Ambulatory Visit (HOSPITAL_COMMUNITY): Payer: Self-pay

## 2021-10-04 NOTE — Telephone Encounter (Signed)
Kimberly Bishop [phone]), Benefit Verification Representative, called from [facility]. States patient's primary insurance ([employer]) is requesting authorization for [employer] Nixalie requests [facility] to be listed as servicing provider on the authorization, in order for them to utilize it. Provided NPI # [id number].  Nixalie provided phone number for patient's insurance to initiate authorization: [phone].  Routed to [facility] clinical pharmacists.  Binnie Kand, RN

## 2021-10-06 ENCOUNTER — Other Ambulatory Visit (HOSPITAL_COMMUNITY): Payer: Self-pay

## 2021-10-07 ENCOUNTER — Other Ambulatory Visit: Payer: Self-pay | Admitting: Infectious Diseases

## 2021-10-07 ENCOUNTER — Telehealth: Payer: Self-pay

## 2021-10-07 DIAGNOSIS — B20 Human immunodeficiency virus [HIV] disease: Secondary | ICD-10-CM

## 2021-10-07 MED ORDER — SUNLENCA 463.5 MG/1.5ML ~~LOC~~ SOLN: 927.0000 mg | SUBCUTANEOUS | 1 refills | Status: DC

## 2021-10-07 NOTE — Telephone Encounter (Signed)
[facility] Patient Advocate Encounter  Prior Authorization for Victorio Palm has been approved.    PA# [id number] Effective dates: 10/07/21 through 10/08/22  [facility] will [facility] our Buy and [facility] (Tax ID [id number] & NPI [id number]) medication will be delivered to the clinic.  [facility] will continue to follow.  [name], [facility] Patient [facility] for Infectious Disease Phone: [phone] Fax:  [phone]

## 2021-10-11 ENCOUNTER — Other Ambulatory Visit (HOSPITAL_COMMUNITY): Payer: Self-pay

## 2021-10-13 ENCOUNTER — Other Ambulatory Visit (HOSPITAL_COMMUNITY): Payer: Self-pay

## 2021-10-14 ENCOUNTER — Telehealth: Payer: Self-pay

## 2021-10-14 NOTE — Telephone Encounter (Signed)
[facility] Patient Advocate Encounter  Patient's medication (Cabenuva 400-'600mg'$ ) have been couriered to [facility] from [employer] and will be administered on the patient next office visit on 10/20/21.  [name] , [facility] Patient [facility] for Infectious Disease Phone: [phone] Fax:  [phone]

## 2021-10-15 ENCOUNTER — Other Ambulatory Visit (HOSPITAL_COMMUNITY): Payer: Self-pay

## 2021-10-18 ENCOUNTER — Telehealth: Payer: Self-pay

## 2021-10-18 NOTE — Telephone Encounter (Signed)
[facility] Patient Advocate Encounter  Patient's medication (Sunlenca Injection) have been couriered to [facility] from [place] and will be administered on the patient next office visit on 10/20/21.  [name] , [facility] Patient [facility] for Infectious Disease Phone: [phone] Fax:  [phone]

## 2021-10-20 ENCOUNTER — Other Ambulatory Visit: Payer: Self-pay

## 2021-10-20 ENCOUNTER — Ambulatory Visit (INDEPENDENT_AMBULATORY_CARE_PROVIDER_SITE_OTHER): Payer: 59 | Admitting: Pharmacist

## 2021-10-20 DIAGNOSIS — B20 Human immunodeficiency virus [HIV] disease: Secondary | ICD-10-CM | POA: Diagnosis not present

## 2021-10-20 MED ORDER — LENACAPAVIR SODIUM 463.5 MG/1.5ML ~~LOC~~ SOLN
927.0000 mg | Freq: Once | SUBCUTANEOUS | Status: AC
  Administered 2021-10-20: 927 mg via SUBCUTANEOUS

## 2021-10-20 MED ORDER — CABOTEGRAVIR & RILPIVIRINE ER 400 & 600 MG/2ML IM SUER
1.0000 | Freq: Once | INTRAMUSCULAR | Status: AC
  Administered 2021-10-20: 1 via INTRAMUSCULAR

## 2021-10-20 NOTE — Progress Notes (Signed)
HPI: Kimberly Bishop is a 31 y.o. female who presents to the [place] clinic for [place] administration.  Patient Active Problem List   Diagnosis Date Noted   Screening for cervical cancer 08/18/2021   Vitamin D deficiency 07/22/2021   Healthcare maintenance 01/18/2021   Nephrolithiasis 01/18/2021   AIDS (acquired immunodeficiency syndrome), CD4 <=200 ([place])    HIV (human immunodeficiency virus infection) ([place]) 03/29/2019   CIN I (cervical intraepithelial neoplasia I) 11/28/2018   ASCUS with positive high risk HPV cervical 11/26/2018   Molluscum contagiosum 03/22/2016    Patient's Medications  New Prescriptions   No medications on file  Previous Medications   BLOOD PRESSURE MONITORING DEVI    1 Device by Does not apply route once a week. ICD 10: O09.90   CABOTEGRAVIR & RILPIVIRINE ER (CABENUVA) 400 & 600 MG/2ML INJECTION    Inject 1 kit into the muscle every 30 (thirty) days.   LENACAPAVIR (SUNLENCA) 300 MG TABLET    Take 2 tablets by mouth See admin instructions. This is the 2-day initiation kit: Take two 300-mg tablets on Day 1 & Day 2 with or without food.   MIRTAZAPINE (REMERON) 15 MG TABLET    Take 1 tablet (15 mg total) by mouth at bedtime.   PROMETHAZINE (PHENERGAN) 25 MG TABLET    Take 1 tablet (25 mg total) by mouth every 6 (six) hours as needed for nausea or vomiting.   SQ INJECTION LENACAPAVIR (SUNLENCA) 463.5 MG/1.5ML SQ INJECTION    Inject 3 mLs (927 mg total) into the skin every 6 (six) months. Administer each injection subcutaneously at separate sites in the abdomen (more or equal to 2 inches from the navel).   SULFAMETHOXAZOLE-TRIMETHOPRIM (BACTRIM) 400-80 MG TABLET    Take 1 tablet by mouth daily.   TRIAMCINOLONE OINTMENT (KENALOG) 0.5 %    Apply 1 application topically 2 (two) times daily.   VITAMIN D, ERGOCALCIFEROL, (DRISDOL) 1.25 MG (50000 UNIT) CAPS CAPSULE    Take 1 capsule by mouth every Wednesday  Modified Medications   No medications on  file  Discontinued Medications   No medications on file    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 ([place])    History of kidney stones    HIV (human immunodeficiency virus infection) ([place])    Hypertension    Immune deficiency disorder ([place])    Infection    UTI   Kidney stones     Social History: Social History   Socioeconomic History   Marital status: Married    Spouse name: Not on file   Number of children: Not on file   Years of education: Not on file   Highest education level: Not on file  Occupational History    Comment: unemployed  Tobacco Use   Smoking status: Never   Smokeless tobacco: Never  Vaping Use   Vaping Use: Never used  Substance and Sexual Activity   Alcohol use: Not Currently    Comment: occasional -    Drug use: Not Currently    Types: Marijuana    Comment: last use 08/24/18   Sexual activity: Yes    Partners: Male    Birth control/protection: None, Implant    Comment: declined condoms  Other Topics Concern   Not on file  Social History Narrative   Not on file   Social Determinants of Health   Financial Resource Strain: Not  on file  Food Insecurity: No Food Insecurity (09/11/2019)   Hunger Vital Sign    Worried About Running Out of Food in the Last Year: Never true    Ran Out of Food in the Last Year: Never true  Transportation Needs: No Transportation Needs (09/11/2019)   PRAPARE - Hydrologist (Medical): No    Lack of Transportation (Non-Medical): No  Physical Activity: Not on file  Stress: Not on file  Social Connections: Not on file    Labs: Lab Results  Component Value Date   HIV1RNAQUANT Not Detected 09/24/2021   HIV1RNAQUANT 84 (H) 07/20/2021   HIV1RNAQUANT 122 (H) 06/22/2021   CD4TABS 161 (L) 07/20/2021   CD4TABS 173 (L) 06/22/2021   CD4TABS 93 (L) 03/24/2021    RPR and STI Lab  Results  Component Value Date   LABRPR NON-REACTIVE 02/18/2021   LABRPR NON-REACTIVE 10/13/2020   LABRPR NON REACTIVE 03/26/2019   LABRPR Non Reactive 01/31/2019   LABRPR Non Reactive 10/18/2018    STI Results GC GC CT CT  Latest Ref Rng & Units  NEGATIVE  NEGATIVE  08/18/2021  4:45 PM Negative   Negative    10/13/2020  9:03 AM Negative   Negative    03/26/2019 12:43 PM Negative   Negative    10/18/2018 12:00 AM Negative   Negative    05/25/2018 12:00 AM Negative   **POSITIVE**    03/28/2017 12:00 AM Negative   Negative    02/11/2017 12:00 AM Negative   Negative    07/07/2016 12:00 AM Negative   Negative    10/27/2015 12:00 AM Negative   Negative    02/07/2015 12:00 AM Negative   **POSITIVE**    05/19/2014 12:00 AM NG: Negative   CT: Negative    12/23/2013  3:05 PM  NEGATIVE   NEGATIVE   05/16/2013 12:00 AM NG: Negative   CT: Negative    10/22/2012  5:48 PM  NEGATIVE   NEGATIVE   02/12/2012 10:52 PM  NEGATIVE   NEGATIVE     Hepatitis B Lab Results  Component Value Date   HEPBSAB NON-REACTIVE 09/10/2020   HEPBSAG NON-REACTIVE 09/10/2020   HEPBCAB NON-REACTIVE 09/10/2020   Hepatitis C Lab Results  Component Value Date   HEPCAB NON-REACTIVE 09/10/2020   Hepatitis A Lab Results  Component Value Date   HAV NEG 12/09/2011   Lipids: Lab Results  Component Value Date   CHOL 182 10/08/2020   TRIG 83 10/08/2020   HDL 42 (L) 10/08/2020   CHOLHDL 4.3 10/08/2020   VLDL 9 10/27/2015   LDLCALC 122 (H) 10/08/2020    TARGET DATE: Kimberly Bishop - the 8th Sunlenca - due ~ February 9th   Assessment: Kimberly Bishop presents today for her maintenance Cabenuva injections and to initiate Sunlenca. Past Cabenuva injections were tolerated well without issues.  Administered cabotegravir 462m/2mL in left upper outer quadrant of the gluteal muscle. Administered rilpivirine 600 mg/238min the right upper outer quadrant of the gluteal muscle. No issues with injections.  Also counseled patient  regarding Sunlenca. She will need to complete an oral loading dose. Counseled that she will take two Sunlenca 300 mg tablets (600 mg total) orally today and will take two Sunlenca 300 mg tablets (600 mg total) orally tomorrow at 3pm. Counseled that Sunlenca is two separate subcutaneous injections in the abdomen every 6 months. Patient verbalized understanding.    Observed patient take two tablets of lenacapavir 300 mg (600 mg total) in room. Gave patient the  two tablets for tomorrow's dose to take home.    Administered lenacapavir 463.5/1.92m subcutaneously in upper left abdomen. Administered lenacapavir 463.5/1.57msubcutaneously in lower left abdomen more than two inches away from first injection site. Monitored patient for 10 minutes after injection. Injections were tolerated well without issue. Minor leaking occurred.  Per [place] will transition her to every 2 month Cabenuva injections if she remains undetectable.  Plan: - Cabenuva injections administered - Sunlenca 600 mg tablets administered - Sunlenca injections administered - HIV RNA and CD4 today - Next Cabenuva injections scheduled for 12/15/21 with me - Next Sunlenca injection due in February - Call with any issues or questions  Deneshia Zucker L. Everest Hacking, PharmD, BCIDP, AAHIVP, CPGrantlinical Pharmacist Practitioner [facility]

## 2021-10-21 LAB — T-HELPER CELL (CD4) - (RCID CLINIC ONLY)
CD4 % Helper T Cell: 17 % — ABNORMAL LOW (ref 33–65)
CD4 T Cell Abs: 283 /uL — ABNORMAL LOW (ref 400–1790)

## 2021-10-22 LAB — HIV-1 RNA QUANT-NO REFLEX-BLD
HIV 1 RNA Quant: 25 Copies/mL — ABNORMAL HIGH
HIV-1 RNA Quant, Log: 1.39 Log cps/mL — ABNORMAL HIGH

## 2021-10-22 NOTE — Progress Notes (Signed)
Much better 

## 2021-10-25 ENCOUNTER — Other Ambulatory Visit: Payer: Self-pay | Admitting: Pharmacist

## 2021-10-25 ENCOUNTER — Encounter: Payer: Self-pay | Admitting: Pharmacist

## 2021-10-25 ENCOUNTER — Other Ambulatory Visit (HOSPITAL_COMMUNITY): Payer: Self-pay

## 2021-10-25 DIAGNOSIS — B20 Human immunodeficiency virus [HIV] disease: Secondary | ICD-10-CM

## 2021-10-25 MED ORDER — CABOTEGRAVIR & RILPIVIRINE ER 600 & 900 MG/3ML IM SUER
1.0000 | INTRAMUSCULAR | 5 refills | Status: DC
  Filled 2021-10-25 – 2021-11-03 (×2): qty 6, 60d supply, fill #0
  Filled 2021-12-24: qty 6, 30d supply, fill #1
  Filled 2021-12-27: qty 6, 56d supply, fill #1
  Filled 2022-03-04: qty 6, 56d supply, fill #2

## 2021-11-03 ENCOUNTER — Other Ambulatory Visit (HOSPITAL_COMMUNITY): Payer: Self-pay

## 2021-11-04 ENCOUNTER — Telehealth: Payer: Self-pay

## 2021-11-04 NOTE — Telephone Encounter (Signed)
[facility] Patient Advocate Encounter  Patient's medication Kimberly Bishop) have been couriered to [facility] from [employer] and will be administered on the patient next office visit on 11/17/21.  Cabenuva 600 &900 mg (every other month)  Kimberly Bishop , [place] Patient [facility] for Infectious Disease Phone: [phone] Fax:  [phone]

## 2021-11-10 ENCOUNTER — Other Ambulatory Visit (HOSPITAL_COMMUNITY): Payer: Self-pay

## 2021-11-17 ENCOUNTER — Ambulatory Visit (INDEPENDENT_AMBULATORY_CARE_PROVIDER_SITE_OTHER): Payer: 59 | Admitting: Pharmacist

## 2021-11-17 ENCOUNTER — Other Ambulatory Visit: Payer: Self-pay

## 2021-11-17 ENCOUNTER — Encounter: Payer: Self-pay | Admitting: Pharmacist

## 2021-11-17 DIAGNOSIS — B20 Human immunodeficiency virus [HIV] disease: Secondary | ICD-10-CM

## 2021-11-17 MED ORDER — CABOTEGRAVIR & RILPIVIRINE ER 600 & 900 MG/3ML IM SUER
1.0000 | Freq: Once | INTRAMUSCULAR | Status: AC
  Administered 2021-11-17: 1 via INTRAMUSCULAR

## 2021-11-17 NOTE — Progress Notes (Signed)
HPI: Kimberly Bishop is a 31 y.o. female who presents to the [place] clinic for [place] administration.  Patient Active Problem List   Diagnosis Date Noted   Screening for cervical cancer 08/18/2021   Vitamin D deficiency 07/22/2021   Healthcare maintenance 01/18/2021   Nephrolithiasis 01/18/2021   AIDS (acquired immunodeficiency syndrome), CD4 <=200 ([place])    HIV (human immunodeficiency virus infection) ([place]) 03/29/2019   CIN I (cervical intraepithelial neoplasia I) 11/28/2018   ASCUS with positive high risk HPV cervical 11/26/2018   Molluscum contagiosum 03/22/2016    Patient's Medications  New Prescriptions   No medications on file  Previous Medications   BLOOD PRESSURE MONITORING DEVI    1 Device by Does not apply route once a week. ICD 10: O09.90   CABOTEGRAVIR & RILPIVIRINE ER (CABENUVA) 600 & 900 MG/3ML INJECTION    Inject 1 kit into the muscle every 2 (two) months.   LENACAPAVIR (SUNLENCA) 300 MG TABLET    Take 2 tablets by mouth See admin instructions. This is the 2-day initiation kit: Take two 300-mg tablets on Day 1 & Day 2 with or without food.   MIRTAZAPINE (REMERON) 15 MG TABLET    Take 1 tablet (15 mg total) by mouth at bedtime.   PROMETHAZINE (PHENERGAN) 25 MG TABLET    Take 1 tablet (25 mg total) by mouth every 6 (six) hours as needed for nausea or vomiting.   SQ INJECTION LENACAPAVIR (SUNLENCA) 463.5 MG/1.5ML SQ INJECTION    Inject 3 mLs (927 mg total) into the skin every 6 (six) months. Administer each injection subcutaneously at separate sites in the abdomen (more or equal to 2 inches from the navel).   SULFAMETHOXAZOLE-TRIMETHOPRIM (BACTRIM) 400-80 MG TABLET    Take 1 tablet by mouth daily.   TRIAMCINOLONE OINTMENT (KENALOG) 0.5 %    Apply 1 application topically 2 (two) times daily.   VITAMIN D, ERGOCALCIFEROL, (DRISDOL) 1.25 MG (50000 UNIT) CAPS CAPSULE    Take 1 capsule by mouth every Wednesday  Modified Medications   No medications on file   Discontinued Medications   No medications on file    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 ([place])    History of kidney stones    HIV (human immunodeficiency virus infection) ([place])    Hypertension    Immune deficiency disorder ([place])    Infection    UTI   Kidney stones     Social History: Social History   Socioeconomic History   Marital status: Married    Spouse name: Not on file   Number of children: Not on file   Years of education: Not on file   Highest education level: Not on file  Occupational History    Comment: unemployed  Tobacco Use   Smoking status: Never   Smokeless tobacco: Never  Vaping Use   Vaping Use: Never used  Substance and Sexual Activity   Alcohol use: Not Currently    Comment: occasional -    Drug use: Not Currently    Types: Marijuana    Comment: last use 08/24/18   Sexual activity: Yes    Partners: Male    Birth control/protection: None, Implant    Comment: declined condoms  Other Topics Concern   Not on file  Social History Narrative   Not on file   Social Determinants of Health   Financial Resource Strain: Not on file  Food Insecurity: No Food Insecurity (09/11/2019)   Hunger Vital Sign    Worried About Running Out of Food in the Last Year: Never true    Ran Out of Food in the Last Year: Never true  Transportation Needs: No Transportation Needs (09/11/2019)   PRAPARE - Hydrologist (Medical): No    Lack of Transportation (Non-Medical): No  Physical Activity: Not on file  Stress: Not on file  Social Connections: Not on file    Labs: Lab Results  Component Value Date   HIV1RNAQUANT 25 (H) 10/20/2021   HIV1RNAQUANT Not Detected 09/24/2021   HIV1RNAQUANT 84 (H) 07/20/2021   CD4TABS 283 (L) 10/20/2021   CD4TABS 161 (L) 07/20/2021   CD4TABS 173 (L) 06/22/2021    RPR and STI Lab Results   Component Value Date   LABRPR NON-REACTIVE 02/18/2021   LABRPR NON-REACTIVE 10/13/2020   LABRPR NON REACTIVE 03/26/2019   LABRPR Non Reactive 01/31/2019   LABRPR Non Reactive 10/18/2018    STI Results GC GC CT CT  Latest Ref Rng & Units  NEGATIVE  NEGATIVE  08/18/2021  4:45 PM Negative   Negative    10/13/2020  9:03 AM Negative   Negative    03/26/2019 12:43 PM Negative   Negative    10/18/2018 12:00 AM Negative   Negative    05/25/2018 12:00 AM Negative   **POSITIVE**    03/28/2017 12:00 AM Negative   Negative    02/11/2017 12:00 AM Negative   Negative    07/07/2016 12:00 AM Negative   Negative    10/27/2015 12:00 AM Negative   Negative    02/07/2015 12:00 AM Negative   **POSITIVE**    05/19/2014 12:00 AM NG: Negative   CT: Negative    12/23/2013  3:05 PM  NEGATIVE   NEGATIVE   05/16/2013 12:00 AM NG: Negative   CT: Negative    10/22/2012  5:48 PM  NEGATIVE   NEGATIVE   02/12/2012 10:52 PM  NEGATIVE   NEGATIVE     Hepatitis B Lab Results  Component Value Date   HEPBSAB NON-REACTIVE 09/10/2020   HEPBSAG NON-REACTIVE 09/10/2020   HEPBCAB NON-REACTIVE 09/10/2020   Hepatitis C Lab Results  Component Value Date   HEPCAB NON-REACTIVE 09/10/2020   Hepatitis A Lab Results  Component Value Date   HAV NEG 12/09/2011   Lipids: Lab Results  Component Value Date   CHOL 182 10/08/2020   TRIG 83 10/08/2020   HDL 42 (L) 10/08/2020   CHOLHDL 4.3 10/08/2020   VLDL 9 10/27/2015   LDLCALC 122 (H) 10/08/2020    TARGET DATE: The 8th  Assessment: Lora presents today for her first every 2 month Cabenuva injections. She was previously on monthly Cabenuva and did well with the injections. She will get her first 600/900 mg injections today and then start her every 2 month schedule going forward.  Administered cabotegravir 660m/3mL in left upper outer quadrant of the gluteal muscle. Administered rilpivirine 900 mg/337min the right upper outer quadrant of the gluteal muscle. No  issues with injections. She will follow up in 2 months for next set of injections.  Plan: - Cabenuva injections administered - HIV RNA today - Next injections scheduled for 01/19/22 with me (SColletta [place]navailable until the last day of her window) - Call with any issues or questions  Zula Hovsepian L. Savina Olshefski, PharmD, BCIDP, AAHIVP, CPEdenlinical Pharmacist Practitioner [facility]

## 2021-11-19 LAB — HIV-1 RNA QUANT-NO REFLEX-BLD
HIV 1 RNA Quant: NOT DETECTED Copies/mL
HIV-1 RNA Quant, Log: NOT DETECTED Log cps/mL

## 2021-12-15 ENCOUNTER — Encounter: Payer: 59 | Admitting: Pharmacist

## 2021-12-24 ENCOUNTER — Other Ambulatory Visit (HOSPITAL_COMMUNITY): Payer: Self-pay

## 2021-12-27 ENCOUNTER — Other Ambulatory Visit (HOSPITAL_COMMUNITY): Payer: Self-pay

## 2021-12-28 ENCOUNTER — Telehealth: Payer: Self-pay

## 2021-12-28 NOTE — Telephone Encounter (Signed)
[facility] Patient Advocate Encounter  Patient's medication [name]) have been couriered to [facility] from [employer] and will be administered on the patient next office visit on 01/19/22.  Kimberly Bishop , [facility] Patient [facility] for Infectious Disease Phone: [phone] Fax:  [phone]

## 2022-01-04 ENCOUNTER — Encounter (INDEPENDENT_AMBULATORY_CARE_PROVIDER_SITE_OTHER): Payer: 59

## 2022-01-04 DIAGNOSIS — R059 Cough, unspecified: Secondary | ICD-10-CM

## 2022-01-04 NOTE — Telephone Encounter (Signed)
Please see the MyChart message reply(ies) for my assessment and plan.    This patient gave consent for this Medical Advice Message and is aware that it may result in a bill to their insurance company, as well as the possibility of receiving a bill for a co-payment or deductible. They are an established patient, but are not seeking medical advice exclusively about a problem treated during an in person or video visit in the last seven days. I did not recommend an in person or video visit within seven days of my reply.    I spent a total of 8 minutes cumulative time within 7 days through MyChart messaging.  Pearlene Teat, NP   

## 2022-01-05 ENCOUNTER — Encounter: Payer: Self-pay | Admitting: Pharmacist

## 2022-01-05 ENCOUNTER — Ambulatory Visit (INDEPENDENT_AMBULATORY_CARE_PROVIDER_SITE_OTHER): Payer: 59

## 2022-01-05 DIAGNOSIS — Z3200 Encounter for pregnancy test, result unknown: Secondary | ICD-10-CM

## 2022-01-05 DIAGNOSIS — Z3201 Encounter for pregnancy test, result positive: Secondary | ICD-10-CM

## 2022-01-05 LAB — POCT PREGNANCY, URINE: Preg Test, Ur: POSITIVE — AB

## 2022-01-05 NOTE — Progress Notes (Signed)
Possible Pregnancy  Here today for pregnancy confirmation. UPT in office today is positive. Pt reports first positive home UPT on 12/31/21. Reviewed dating with patient:   LMP: 12/02/21 EDD: 09/08/22 4w 6d today  OB history reviewed; single c-section, 2 SAB. Reviewed medications and allergies with patient. Recommended pt begin prenatal vitamin and schedule prenatal care.  Annabell Howells, RN 01/05/2022  1:56 PM

## 2022-01-18 NOTE — Progress Notes (Signed)
HPI: Kimberly Bishop is a 31 y.o. female who presents to the [facility] for [place] administration.  Patient Active Problem List   Diagnosis Date Noted   Screening for cervical cancer 08/18/2021   Vitamin D deficiency 07/22/2021   Healthcare maintenance 01/18/2021   Nephrolithiasis 01/18/2021   AIDS (acquired immunodeficiency syndrome), CD4 <=200 ([place])    HIV (human immunodeficiency virus infection) ([place]) 03/29/2019   CIN I (cervical intraepithelial neoplasia I) 11/28/2018   ASCUS with positive high risk HPV cervical 11/26/2018   Molluscum contagiosum 03/22/2016    Patient's Medications  New Prescriptions   No medications on file  Previous Medications   BLOOD PRESSURE MONITORING DEVI    1 Device by Does not apply route once a week. ICD 10: O09.90   CABOTEGRAVIR & RILPIVIRINE ER (CABENUVA) 600 & 900 MG/3ML INJECTION    Inject 1 kit into the muscle every 2 (two) months.   PRENATAL MV & MIN W/FA-DHA (PRENATAL ADULT GUMMY/DHA/FA PO)    Take by mouth daily.   SQ INJECTION LENACAPAVIR (SUNLENCA) 463.5 MG/1.5ML SQ INJECTION    Inject 3 mLs (927 mg total) into the skin every 6 (six) months. Administer each injection subcutaneously at separate sites in the abdomen (more or equal to 2 inches from the navel).  Modified Medications   No medications on file  Discontinued Medications   No medications on file    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 ([place])    History of kidney stones    HIV (human immunodeficiency virus infection) ([place])    Hypertension    Immune deficiency disorder ([place])    Infection    UTI   Kidney stones     Social History: Social History   Socioeconomic History   Marital status: Married    Spouse name: Not on file   Number of children: Not on file   Years of education: Not on file   Highest education level: Not on file  Occupational  History    Comment: unemployed  Tobacco Use   Smoking status: Never   Smokeless tobacco: Never  Vaping Use   Vaping Use: Never used  Substance and Sexual Activity   Alcohol use: Not Currently    Comment: occasional -    Drug use: Not Currently    Types: Marijuana    Comment: last use 08/24/18   Sexual activity: Yes    Partners: Male    Birth control/protection: None, Implant    Comment: declined condoms  Other Topics Concern   Not on file  Social History Narrative   Not on file   Social Determinants of Health   Financial Resource Strain: Not on file  Food Insecurity: No Food Insecurity (09/11/2019)   Hunger Vital Sign    Worried About Running Out of Food in the Last Year: Never true    Ran Out of Food in the Last Year: Never true  Transportation Needs: No Transportation Needs (09/11/2019)   PRAPARE - Hydrologist (Medical): No    Lack of Transportation (Non-Medical): No  Physical Activity: Not on file  Stress: Not on file  Social Connections: Not on file    Labs: Lab Results  Component Value Date   HIV1RNAQUANT Not Detected 11/17/2021   HIV1RNAQUANT 25 (H) 10/20/2021   HIV1RNAQUANT Not Detected 09/24/2021   CD4TABS 283 (L) 10/20/2021   CD4TABS 161 (L)  07/20/2021   CD4TABS 173 (L) 06/22/2021    RPR and STI Lab Results  Component Value Date   LABRPR NON-REACTIVE 02/18/2021   LABRPR NON-REACTIVE 10/13/2020   LABRPR NON REACTIVE 03/26/2019   LABRPR Non Reactive 01/31/2019   LABRPR Non Reactive 10/18/2018    STI Results GC GC CT CT  Latest Ref Rng & Units  NEGATIVE  NEGATIVE  08/18/2021  4:45 PM Negative   Negative    10/13/2020  9:03 AM Negative   Negative    03/26/2019 12:43 PM Negative   Negative    10/18/2018 12:00 AM Negative   Negative    05/25/2018 12:00 AM Negative   **POSITIVE**    03/28/2017 12:00 AM Negative   Negative    02/11/2017 12:00 AM Negative   Negative    07/07/2016 12:00 AM Negative   Negative     10/27/2015 12:00 AM Negative   Negative    02/07/2015 12:00 AM Negative   **POSITIVE**    05/19/2014 12:00 AM NG: Negative   CT: Negative    12/23/2013  3:05 PM  NEGATIVE   NEGATIVE   05/16/2013 12:00 AM NG: Negative   CT: Negative    10/22/2012  5:48 PM  NEGATIVE   NEGATIVE   02/12/2012 10:52 PM  NEGATIVE   NEGATIVE     Hepatitis B Lab Results  Component Value Date   HEPBSAB NON-REACTIVE 09/10/2020   HEPBSAG NON-REACTIVE 09/10/2020   HEPBCAB NON-REACTIVE 09/10/2020   Hepatitis C Lab Results  Component Value Date   HEPCAB NON-REACTIVE 09/10/2020   Hepatitis A Lab Results  Component Value Date   HAV NEG 12/09/2011   Lipids: Lab Results  Component Value Date   CHOL 182 10/08/2020   TRIG 83 10/08/2020   HDL 42 (L) 10/08/2020   CHOLHDL 4.3 10/08/2020   VLDL 9 10/27/2015   LDLCALC 122 (H) 10/08/2020    TARGET DATE: The 8th of the month  Assessment: Kimberly Bishop presents today for 105-monthmaintenance Cabenuva injections. Past injections were tolerated well without issues.  Patient is [redacted] weeks pregnant, so we wanted to have a discussion with her about her current HIV therapy and the plans during her pregnancy. She is currently on Cabenuva as well as Sunlenca for her HIV. We discussed that there is little data currently for the use of Cabenuva in pregnant patients. We shared that in a recent study there were only 20-25 patients out of thousands who became pregnant during the study and stayed on CGabon We mentioned side effects and the outcomes of the pregnancies such as very little instance of birth defects in the babies born and that some stillbirths were reported. We also discussed the importance of the volume of distribution for Cabenuva in regards to pregnancy. The patient will experience volume changes as the pregnancy progresses and we explained the concern for not reaching therapeutic levels of drug if we did the 275-monthosing, especially in the 2nd and 3rd  trimesters. We suggested the idea of changing to a 1-53-monthsing once we reach that time frame. [facility] informed the patient that there was no human pregnancy data currently available. We did discuss how a study in pregnant rabbits showed little impact on the babies since that is what data is available. We weren't as concerned with this part of the treatment since she would only be receiving one dose during her pregnancy. We also brought up the potential to switch to an oral HIV regimen since these  have a lot more data on pregnant patients than her current regimens. Dr. Tommy Medal also came in and reiterated some of these points above but left the decision up to the patient, explaining how we are informing her of the options because we want her to make a fully informed decision and that we would never force one treatment option upon her.  After reviewing all the information presented to her, the patient asked to stay on [place] and [place] for the time being. She is open to switching the dosing to every month for [facility] after her next dose on 03/16/2022 after today's dose. She did not want to switch to oral therapy, but was agreeable to calling us if she changed her mind.  [facility] was offered the flu and COVID vaccines since she is eligible for them. She was hesitant about the COVID vaccine since she has never received a COVID vaccine and deferred to getting it another time. She did agree to the flu vaccine and a nurse gave it to her while Dr. Tommy Medal was with her.   Dr. Derek Mound nurse administered [name] for today's visit. She will follow up with pharmacy for her next injections 03/16/2022.  Plan: - Cabenuva injections administered - Flu vaccine administered - Offer COVID vaccine at next visit - Next injections scheduled for 03/16/2022 at 3:15pm with Cassie - Call with any issues or questions  Tanja Port, [facility]

## 2022-01-19 ENCOUNTER — Encounter: Payer: Self-pay | Admitting: Infectious Disease

## 2022-01-19 ENCOUNTER — Ambulatory Visit (INDEPENDENT_AMBULATORY_CARE_PROVIDER_SITE_OTHER): Payer: 59 | Admitting: Infectious Disease

## 2022-01-19 ENCOUNTER — Ambulatory Visit (INDEPENDENT_AMBULATORY_CARE_PROVIDER_SITE_OTHER): Payer: 59 | Admitting: Pharmacist

## 2022-01-19 ENCOUNTER — Other Ambulatory Visit: Payer: Self-pay

## 2022-01-19 VITALS — BP 117/76 | HR 84 | Temp 98.2°F | Wt 140.0 lb

## 2022-01-19 DIAGNOSIS — B20 Human immunodeficiency virus [HIV] disease: Secondary | ICD-10-CM

## 2022-01-19 DIAGNOSIS — Z23 Encounter for immunization: Secondary | ICD-10-CM

## 2022-01-19 DIAGNOSIS — Z7185 Encounter for immunization safety counseling: Secondary | ICD-10-CM

## 2022-01-19 DIAGNOSIS — R69 Illness, unspecified: Secondary | ICD-10-CM | POA: Diagnosis not present

## 2022-01-19 DIAGNOSIS — O0990 Supervision of high risk pregnancy, unspecified, unspecified trimester: Secondary | ICD-10-CM

## 2022-01-19 MED ORDER — CABOTEGRAVIR & RILPIVIRINE ER 600 & 900 MG/3ML IM SUER
1.0000 | Freq: Once | INTRAMUSCULAR | Status: AC
  Administered 2022-01-19: 1 via INTRAMUSCULAR

## 2022-01-19 NOTE — Addendum Note (Signed)
Addended by: Leatrice Jewels on: 01/19/2022 04:34 PM   Modules accepted: Orders

## 2022-01-19 NOTE — Progress Notes (Signed)
Subjective:  Chief complaint: follow-up for HIV disease on medications   Patient ID: Kimberly Bishop, female    DOB: 1990/10/11, 31 y.o.   MRN: [id number]  HPI  Kimberly Bishop is a 31 year old 22 woman who was perinatally infected with HIV and has struggled with oral therapy for HIV except when she has been pregnant.  More recently she has been successfully suppressed on a combination of Cabenuva given monthly and then every 2 months along with Lenacapravir SQ q 43M.  She is now early days of her 2nd pregnancy.  Has been counseled that oral therapy unlike long-acting therapy has been studied in pregnancy and oral therapy would be the preferred regimen.  She really would prefer however to stay on long-acting treatment.  We did review there is a paucity of data of Cabenuva in pregnant women and there are some concerns re PK in 2nd and 3rd trimesters.   Re Lenacapravir there is only animal data but no human data with this drug and pregnancy.    Past Medical History:  Diagnosis Date   AIDS (acquired immunodeficiency syndrome), CD4 <=200 ([place])    History of kidney stones    HIV (human immunodeficiency virus infection) ([place])    Hypertension    Immune deficiency disorder ([place])    Infection    UTI   Kidney stones     Past Surgical History:  Procedure Laterality Date   APPENDECTOMY  2011   CESAREAN SECTION N/A 03/29/2019   Procedure: CESAREAN SECTION;  Surgeon: Jonnie Kind, MD;  Location: [facility] LD ORS;  Service: Obstetrics;  Laterality: N/A;   CYSTOSCOPY W/ URETERAL STENT PLACEMENT Right 11/03/2016   Procedure: CYSTOSCOPY WITH RETROGRADE PYELOGRAM/URETERAL STENT PLACEMENT;  Surgeon: Kathie Rhodes, MD;  Location: [facility] ORS;  Service: Urology;  Laterality: Right;   CYSTOSCOPY/URETEROSCOPY/HOLMIUM LASER/STENT PLACEMENT Right 01/16/2017   Procedure: CYSTOSCOPY/URETEROSCOPY/ STONE EXTRACTION/STENT removal;  Surgeon: Kathie Rhodes, MD;  Location: [facility] ORS;  Service: Urology;  Laterality: Right;    IR DIL URETER RIGHT  12/05/2016   IR NEPHROSTOMY PLACEMENT RIGHT  11/07/2016   IR URETERAL STENT PLACEMENT EXISTING ACCESS RIGHT  12/05/2016   NEPHROLITHOTOMY Right 12/05/2016   Procedure: NEPHROLITHOTOMY PERCUTANEOUS;  Surgeon: Kathie Rhodes, MD;  Location: [facility] ORS;  Service: Urology;  Laterality: Right;    Family History  Adopted: Yes  Problem Relation Age of Onset   Hypertension Mother    Autism Brother       Social History   Socioeconomic History   Marital status: Married    Spouse name: Not on file   Number of children: Not on file   Years of education: Not on file   Highest education level: Not on file  Occupational History    Comment: unemployed  Tobacco Use   Smoking status: Never   Smokeless tobacco: Never  Vaping Use   Vaping Use: Never used  Substance and Sexual Activity   Alcohol use: Not Currently    Comment: occasional -    Drug use: Not Currently    Types: Marijuana    Comment: last use 08/24/18   Sexual activity: Yes    Partners: Male    Birth control/protection: None, Implant    Comment: declined condoms  Other Topics Concern   Not on file  Social History Narrative   Not on file   Social Determinants of Health   Financial Resource Strain: Not on file  Food Insecurity: No Food Insecurity (09/11/2019)   Hunger Vital Sign  Worried About Charity fundraiser in the Last Year: Never true    [place] in the Last Year: Never true  Transportation Needs: No Transportation Needs (09/11/2019)   PRAPARE - Hydrologist (Medical): No    Lack of Transportation (Non-Medical): No  Physical Activity: Not on file  Stress: Not on file  Social Connections: Not on file    No Known Allergies   Current Outpatient Medications:    Blood Pressure Monitoring DEVI, 1 Device by Does not apply route once a week. ICD 10: O09.90, Disp: 1 Device, Rfl: 0   cabotegravir & rilpivirine ER (CABENUVA) 600 & 900 MG/3ML injection, Inject 1 kit  into the muscle every 2 (two) months., Disp: 6 mL, Rfl: 5   Prenatal MV & Min w/FA-DHA (PRENATAL ADULT GUMMY/DHA/FA PO), Take by mouth daily., Disp: , Rfl:    SQ injection lenacapavir (SUNLENCA) 463.5 MG/1.5ML SQ injection, Inject 3 mLs (927 mg total) into the skin every 6 (six) months. Administer each injection subcutaneously at separate sites in the abdomen (more or equal to 2 inches from the navel)., Disp: 3 mL, Rfl: 1   Review of Systems  Constitutional:  Negative for activity change, appetite change, chills, diaphoresis, fatigue, fever and unexpected weight change.  HENT:  Negative for congestion, rhinorrhea, sinus pressure, sneezing, sore throat and trouble swallowing.   Eyes:  Negative for photophobia and visual disturbance.  Respiratory:  Negative for cough, chest tightness, shortness of breath, wheezing and stridor.   Cardiovascular:  Negative for chest pain, palpitations and leg swelling.  Gastrointestinal:  Negative for abdominal distention, abdominal pain, anal bleeding, blood in stool, constipation, diarrhea, nausea and vomiting.  Genitourinary:  Negative for difficulty urinating, dysuria, flank pain and hematuria.  Musculoskeletal:  Negative for arthralgias, back pain, gait problem, joint swelling and myalgias.  Skin:  Negative for color change, pallor, rash and wound.  Neurological:  Negative for dizziness, tremors, weakness and light-headedness.  Hematological:  Negative for adenopathy. Does not bruise/bleed easily.  Psychiatric/Behavioral:  Negative for agitation, behavioral problems, confusion, decreased concentration, dysphoric mood and sleep disturbance.        Objective:   Physical Exam Constitutional:      General: She is not in acute distress.    Appearance: Normal appearance. She is well-developed. She is not ill-appearing or diaphoretic.  HENT:     Head: Normocephalic and atraumatic.     Right Ear: Hearing and external ear normal.     Left Ear: Hearing and  external ear normal.     Nose: No nasal deformity or rhinorrhea.  Eyes:     General: No scleral icterus.    Conjunctiva/sclera: Conjunctivae normal.     Right eye: Right conjunctiva is not injected.     Left eye: Left conjunctiva is not injected.     Pupils: Pupils are equal, round, and reactive to light.  Neck:     Vascular: No JVD.  Cardiovascular:     Rate and Rhythm: Normal rate and regular rhythm.     Heart sounds: Normal heart sounds, S1 normal and S2 normal. No murmur heard.    No friction rub.  Abdominal:     General: Bowel sounds are normal. There is no distension.     Palpations: Abdomen is soft.     Tenderness: There is no abdominal tenderness.  Musculoskeletal:        General: Normal range of motion.     Right shoulder: Normal.  Left shoulder: Normal.     Cervical back: Normal range of motion and neck supple.     Right hip: Normal.     Left hip: Normal.     Right knee: Normal.     Left knee: Normal.  Lymphadenopathy:     Head:     Right side of head: No submandibular, preauricular or posterior auricular adenopathy.     Left side of head: No submandibular, preauricular or posterior auricular adenopathy.     Cervical: No cervical adenopathy.     Right cervical: No superficial or deep cervical adenopathy.    Left cervical: No superficial or deep cervical adenopathy.  Skin:    General: Skin is warm and dry.     Coloration: Skin is not pale.     Findings: No abrasion, bruising, ecchymosis, erythema, lesion or rash.     Nails: There is no clubbing.  Neurological:     General: No focal deficit present.     Mental Status: She is alert and oriented to person, place, and time.     Sensory: No sensory deficit.     Coordination: Coordination normal.     Gait: Gait normal.  Psychiatric:        Attention and Perception: She is attentive.        Mood and Affect: Mood normal.        Speech: Speech normal.        Behavior: Behavior normal. Behavior is cooperative.         Thought Content: Thought content normal.        Judgment: Judgment normal.             Assessment & Plan:  HIV disease:  Lab Results  Component Value Date   HIV1RNAQUANT Not Detected 11/17/2021     Lab Results  Component Value Date   CD4TABS 283 (L) 10/20/2021   CD4TABS 161 (L) 07/20/2021   CD4TABS 173 (L) 06/22/2021    See pasted information below re pregnancy outcomes in those with exposure to [place] as well as animal data on [place] was administered intravenously to pregnant rabbits (up to 20 mg/kg/day on gestation days (GD) 7 to 4), orally to rats (up to 300 mg/kg/day on GD 6 to 17), and subcutaneously to rats (up to 300 mg/kg on GD 6). No significant toxicological effects on embryo-fetal (rats and rabbits) or pre/postnatal (rats) development were observed at exposures (AUC) approximately 16 times (rats) and 39 times (rabbits) the exposure in humans at the recommended human dose of SUNLENCA.   Kimberly Bishop has had risks and fits of long-acting versus oral therapy in pregnancy discussed and she would like to continue to be on long-acting treatment.  We will plan on giving her Cabenuva today.  I will check a viral load with reflex to genotype.  We will have her then come back in 2 months time for another dose of Cabenuva at the every 2 month dose, with viral load checked then and then switch over to every monthly Cabenuva afterwards.  We will also continue every 6 month Lenacapravir  We will do her data into the pregnancy registry.  Vaccine counseling recommended flu shot as well as COVID-19.  She has not had any COVID shots before but she was agreeable to flu vaccination today and she may be agreeable to COVID-19 vaccination in particular to protect her baby.   I spent 42 minutes with the patient including than 50%  of the time in face to face counseling of the patient regarding risks and benefits of oral versus long-acting therapy during  pregnancy,  along with review of medical records in preparation for the visit and during the visit and in coordination of her care.

## 2022-01-22 LAB — HIV RNA, RTPCR W/R GT (RTI, PI,INT)
HIV 1 RNA Quant: NOT DETECTED copies/mL
HIV-1 RNA Quant, Log: NOT DETECTED Log copies/mL

## 2022-01-24 ENCOUNTER — Encounter: Payer: Self-pay | Admitting: Family Medicine

## 2022-02-07 ENCOUNTER — Other Ambulatory Visit (HOSPITAL_COMMUNITY): Payer: Self-pay

## 2022-02-08 ENCOUNTER — Telehealth (INDEPENDENT_AMBULATORY_CARE_PROVIDER_SITE_OTHER): Payer: Medicaid Other

## 2022-02-08 ENCOUNTER — Encounter: Payer: Self-pay | Admitting: Family Medicine

## 2022-02-08 DIAGNOSIS — O0990 Supervision of high risk pregnancy, unspecified, unspecified trimester: Secondary | ICD-10-CM | POA: Insufficient documentation

## 2022-02-08 DIAGNOSIS — Z3689 Encounter for other specified antenatal screening: Secondary | ICD-10-CM

## 2022-02-08 DIAGNOSIS — O09299 Supervision of pregnancy with other poor reproductive or obstetric history, unspecified trimester: Secondary | ICD-10-CM

## 2022-02-08 NOTE — Progress Notes (Addendum)
New OB Intake  I connected with Kimberly C. [place]  on 02/08/22 at 10:15 AM EST by MyChart Video Visit and verified that I am speaking with the correct person using two identifiers. Nurse is located at [facility] and pt is located at home.  I discussed the limitations, risks, security and privacy concerns of performing an evaluation and management service by telephone and the availability of in person appointments. I also discussed with the patient that there may be a patient responsible charge related to this service. The patient expressed understanding and agreed to proceed.  I explained I am completing New OB Intake today. We discussed EDD of 09/08/2022 that is based on LMP of 12/02/2021. Pt is G4/P0121. I reviewed her allergies, medications, Medical/Surgical/OB history, and appropriate screenings. I informed her of [facility] services. [facility] information placed in AVS. Based on history, this is a high risk pregnancy.  Patient Active Problem List   Diagnosis Date Noted   Supervision of high risk pregnancy, antepartum 02/08/2022   Vitamin D deficiency 07/22/2021   Nephrolithiasis 01/18/2021   AIDS (acquired immunodeficiency syndrome), CD4 <=200 ([facility])    History of preterm delivery 04/29/2019   HIV (human immunodeficiency virus infection) ([place]) 03/29/2019   History of intrauterine growth restriction and stillbirth, currently pregnant 03/26/2019   CIN I (cervical intraepithelial neoplasia I) 11/28/2018   ASCUS with positive high risk HPV cervical 11/26/2018   Molluscum contagiosum 03/22/2016     Concerns addressed today -Elevated blood pressure during third trimester with her 2nd pregnancy.  -Patient stated she was on Enalapril for her blood pressure during her 2nd pregnancy.  -HIV and currently seeing Infectious Disease for management. -Plan delivering in [facility] closer to home. She may want to transfer her care to [facility] in [place] after initial OB appointment.  -Needs repeat pap due to  last pap on 08/18/2021 was not a good sample.   Delivery Plans Plans to deliver at [facility]. Patient given information for [facility] Healthy Baby website for more information about [facility] and [place]. Patient is not interested in water birth. Offered upcoming OB visit with CNM to discuss further.  MyChart/Babyscripts MyChart access verified. I explained pt will have some visits in office and some virtually. Babyscripts instructions given and order placed. Patient verifies receipt of registration text/e-mail. Account successfully created and app downloaded.  Blood Pressure Cuff/Weight Scale Patient has her own blood pressure device. Explained after first prenatal appt pt will check weekly and document in 59. Patient does not have weight scale; patient may purchase if they desire to track weight weekly in Babyscripts.  Anatomy US Explained first scheduled [place] will be around 19 weeks. Anatomy US scheduled for 04/14/2022 at 9:30 AM. Pt notified to arrive at 9:15 AM.  Labs Discussed Natera genetic screening with patient. Would like both Panorama and Horizon drawn at new OB visit. Routine prenatal labs needed.  COVID Vaccine Patient has not had COVID vaccine.   Is patient a CenteringPregnancy candidate?  Not a candidate due to Complex coordination of care needed If accepted,    Is patient a Mom+Baby Combined Care candidate?  Not a candidate   If accepted, Mom+Baby staff notified  Social Determinants of Health Food Insecurity: Patient denies food insecurity. WIC Referral: Patient is interested in referral to [facility].  Transportation: Patient denies transportation needs. Childcare: Discussed no children allowed at ultrasound appointments. Offered childcare services; patient declines childcare services at this time.  First visit review I reviewed new OB appt with patient. I  explained they will have a provider visit that includes initial ob labs, genetic screening, ob culture, HGB  A1C, repeat pap,and pelvic exam. Explained pt will be seen by Clarnce Flock, MD  at first visit; encounter routed to appropriate provider. Explained that patient will be seen by pregnancy navigator following visit with provider.   Mariane Baumgarten, [place] 02/08/2022  10:31 AM

## 2022-02-22 ENCOUNTER — Other Ambulatory Visit: Payer: Self-pay

## 2022-02-22 ENCOUNTER — Encounter: Payer: Self-pay | Admitting: Family Medicine

## 2022-02-22 ENCOUNTER — Other Ambulatory Visit (HOSPITAL_COMMUNITY)
Admission: RE | Admit: 2022-02-22 | Discharge: 2022-02-22 | Disposition: A | Discharge status: Home / Self Care | Payer: Medicaid Other | Source: Ambulatory Visit | Attending: Family Medicine | Admitting: Family Medicine

## 2022-02-22 ENCOUNTER — Ambulatory Visit (INDEPENDENT_AMBULATORY_CARE_PROVIDER_SITE_OTHER): Payer: Medicaid Other | Admitting: Family Medicine

## 2022-02-22 VITALS — BP 151/80 | HR 94 | Wt 145.3 lb

## 2022-02-22 DIAGNOSIS — R87610 Atypical squamous cells of undetermined significance on cytologic smear of cervix (ASC-US): Secondary | ICD-10-CM

## 2022-02-22 DIAGNOSIS — O0990 Supervision of high risk pregnancy, unspecified, unspecified trimester: Secondary | ICD-10-CM

## 2022-02-22 DIAGNOSIS — O99891 Other specified diseases and conditions complicating pregnancy: Secondary | ICD-10-CM

## 2022-02-22 DIAGNOSIS — R87810 Cervical high risk human papillomavirus (HPV) DNA test positive: Secondary | ICD-10-CM

## 2022-02-22 DIAGNOSIS — O09293 Supervision of pregnancy with other poor reproductive or obstetric history, third trimester: Secondary | ICD-10-CM | POA: Insufficient documentation

## 2022-02-22 DIAGNOSIS — O0991 Supervision of high risk pregnancy, unspecified, first trimester: Secondary | ICD-10-CM | POA: Diagnosis not present

## 2022-02-22 DIAGNOSIS — O09291 Supervision of pregnancy with other poor reproductive or obstetric history, first trimester: Secondary | ICD-10-CM

## 2022-02-22 DIAGNOSIS — O161 Unspecified maternal hypertension, first trimester: Secondary | ICD-10-CM

## 2022-02-22 DIAGNOSIS — N200 Calculus of kidney: Secondary | ICD-10-CM | POA: Diagnosis not present

## 2022-02-22 DIAGNOSIS — Z8751 Personal history of pre-term labor: Secondary | ICD-10-CM

## 2022-02-22 DIAGNOSIS — Z3A11 11 weeks gestation of pregnancy: Secondary | ICD-10-CM

## 2022-02-22 DIAGNOSIS — Z21 Asymptomatic human immunodeficiency virus [HIV] infection status: Secondary | ICD-10-CM

## 2022-02-22 DIAGNOSIS — Z98891 History of uterine scar from previous surgery: Secondary | ICD-10-CM

## 2022-02-22 DIAGNOSIS — R8271 Bacteriuria: Secondary | ICD-10-CM

## 2022-02-22 MED ORDER — BLOOD PRESSURE KIT DEVI: 1.0000 | 0 refills | Status: DC

## 2022-02-22 NOTE — Progress Notes (Signed)
Subjective:   Kimberly Bishop is a 31 y.o. [phone] at 53w5dby LMP being seen today for her first obstetrical visit.  Her obstetrical history is significant for  well controlled HIV . Patient does not intend to breast feed. Pregnancy history fully reviewed.  Patient reports no complaints.  HISTORY: OB History  Gravida Para Term Preterm AB Living  4 1 0 _0 SAB IAB Ectopic Multiple Live Births  2 0 0 0 1    # Outcome Date GA Lbr Len/2nd Weight Sex Delivery Anes PTL Lv  4 Current           3 SAB 09/23/19 [redacted]w[redacted]d       2 Preterm 03/29/19 3656w3d lb 11.2 oz (2.132 kg) F CS-LTranv Spinal  LIV     Birth Comments: PIH at end of pregnancy     Name: WASTANIS, HENSARLING  Apgar1: 8  Apgar5: 9  1 SAB 01/02/13 [id number]        Last pap smear: Lab Results  Component Value Date   DIAGPAP - Non-diagnostic (A) 08/18/2021   HPV DETECTED (A) 10/18/2018   HPVHIGH Other 08/18/2021   *needs repeat*  Past Medical History:  Diagnosis Date   AIDS (acquired immunodeficiency syndrome), 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 (HCC)[place] History of kidney stones    HIV (human immunodeficiency virus infection) (HCC)[place] Hypertension    Immune deficiency disorder (HCC)[place] Infection    UTI   Kidney stones    Past Surgical History:  Procedure Laterality Date   APPENDECTOMY  2011   CESAREAN SECTION N/A 03/29/2019   Procedure: CESAREAN SECTION;  Surgeon: FergJonnie Kind;  Location: [facility];  Service: Obstetrics;  Laterality: N/A;   CYSTOSCOPY W/ URETERAL STENT PLACEMENT Right 11/03/2016   Procedure: CYSTOSCOPY WITH RETROGRADE PYELOGRAM/URETERAL STENT PLACEMENT;  Surgeon: OtteKathie Rhodes;  Location: [facility];  Service: Urology;  Laterality: Right;   CYSTOSCOPY/URETEROSCOPY/HOLMIUM LASER/STENT PLACEMENT Right 01/16/2017   Procedure: CYSTOSCOPY/URETEROSCOPY/ STONE EXTRACTION/STENT removal;  Surgeon: OtteKathie Rhodes;  Location: [facility];  Service: Urology;  Laterality: Right;   IR DIL URETER RIGHT  12/05/2016    IR NEPHROSTOMY PLACEMENT RIGHT  11/07/2016   IR URETERAL STENT PLACEMENT EXISTING ACCESS RIGHT  12/05/2016   NEPHROLITHOTOMY Right 12/05/2016   Procedure: NEPHROLITHOTOMY PERCUTANEOUS;  Surgeon: OtteKathie Rhodes;  Location: [facility];  Service: Urology;  Laterality: Right;   Family History  Adopted: Yes  Problem Relation Age of Onset   Hypertension Mother    Autism Brother    Social History   Tobacco Use   Smoking status: Never   Smokeless tobacco: Never  Vaping Use   Vaping Use: Never used  Substance Use Topics   Alcohol use: Not Currently    Comment: occasional -    Drug use: Not Currently    Types: Marijuana    Comment: last use 08/24/18   No Known Allergies Current Outpatient Medications on File Prior to Visit  Medication Sig Dispense Refill   cabotegravir & rilpivirine ER (CABENUVA) 600 & 900 MG/3ML injection Inject 1 kit into the muscle every 2 (two) months. 6 mL 5   Prenatal MV & Min w/FA-DHA (PRENATAL ADULT GUMMY/DHA/FA PO) Take by mouth daily.     SQ injection lenacapavir (SUNLENCA) 463.5 MG/1.5ML SQ injection Inject 3 mLs (927 mg total) into the skin every 6 (six) months. Administer each injection subcutaneously  at separate sites in the abdomen (more or equal to 2 inches from the navel). 3 mL 1   No current facility-administered medications on file prior to visit.     Exam   Vitals:   02/22/22 1013  BP: (!) 151/80  Pulse: 94  Weight: 145 lb 4.8 oz (65.9 kg)   Fetal Heart Rate (bpm): 163  Uterus:     Pelvic Exam: Perineum: no hemorrhoids, normal perineum   Vulva: normal external genitalia, no lesions   Vagina:  normal mucosa mild amount of white discharge but denies any symptoms   Cervix: no lesions and normal, pap smear done.   System: General: well-developed, well-nourished female in no acute distress   Skin: normal coloration and turgor, no rashes   Neurologic: oriented, normal, negative, normal mood   Extremities: normal strength, tone, and muscle mass,  ROM of all joints is normal   HEENT PERRLA, extraocular movement intact and sclera clear, anicteric   Neck supple and no masses   Respiratory:  no respiratory distress      Assessment:   Pregnancy: [id number] Patient Active Problem List   Diagnosis Date Noted   Supervision of high risk pregnancy, antepartum 02/08/2022   Vitamin D deficiency 07/22/2021   Nephrolithiasis 01/18/2021   AIDS (acquired immunodeficiency syndrome), CD4 <=200 (HCC)    History of cesarean delivery 04/29/2019   History of preterm delivery 04/29/2019   HIV (human immunodeficiency virus infection) ([place]) 03/29/2019   History of intrauterine growth restriction and stillbirth, currently pregnant 03/26/2019   CIN I (cervical intraepithelial neoplasia I) 11/28/2018   ASCUS with positive high risk HPV cervical 11/26/2018   Molluscum contagiosum 03/22/2016     Plan:  1. Supervision of high risk pregnancy, antepartum BP elevated, see below FHR normal Initial labs drawn. Continue prenatal vitamins. Genetic Screening discussed, NIPS: ordered. Ultrasound discussed; fetal anatomic survey: ordered. Problem list reviewed and updated. The nature of [facility] with multiple MDs and other Advanced Practice Providers was explained to patient; also emphasized that residents, students are part of our team.  2. Asymptomatic HIV infection, with no history of HIV-related illness ([place]) Following w ID, last seen on 01/19/2022 See excellent note by Dr. Tommy Medal regarding discussion of treatment options as well as risks/benefits of staying on her current subq regimen of medications Going to monthly instead of every other month due to concerns about circulating levels of medication She elected to stay on subq long acting medications after this counseling Her VL was undetectable at that visit  3. Nephrolithiasis Mild on CT from 01/10/2021 Has never passed a stone Reports no issues at present  4.  ASCUS with positive high risk HPV cervical Inadequate pap done earlier this year On review of chart had ASCUS/+HPV, needed colpo but was not done Will repeat pap today  5. History of preterm delivery At 36 weeks by cesarean due to high HIV viral load, oligo, IUGR  6. History of cesarean delivery Briefly discussed desires for RCS vs TOLAC, she desires TOLAC  7. Elevated blood pressure without diagnosis of hypertension Baseline labs today, per review of chart no recent prior elevated BP's though does have hx of PreE with last pregnancy  8. History of pre-eclampsia in prior pregnancy   Routine obstetric precautions reviewed. Return in 4 weeks (on 03/22/2022) for [facility], ob visit, needs MD.

## 2022-02-22 NOTE — Patient Instructions (Signed)
Second Trimester of Pregnancy  The second trimester of pregnancy is from week 13 through week 27. This is months 4 through 6 of pregnancy. The second trimester is often a time when you feel your best. Your body has adjusted to being pregnant, and you begin to feel better physically. During the second trimester: Morning sickness has lessened or stopped completely. You may have more energy. You may have an increase in appetite. The second trimester is also a time when the unborn baby (fetus) is growing rapidly. At the end of the sixth month, the fetus may be up to 12 inches long and weigh about 1 pounds. You will likely begin to feel the baby move (quickening) between 16 and 20 weeks of pregnancy. Body changes during your second trimester Your body continues to go through many changes during your second trimester. The changes vary and generally return to normal after the baby is born. Physical changes Your weight will continue to increase. You will notice your lower abdomen bulging out. You may begin to get stretch marks on your hips, abdomen, and breasts. Your breasts will continue to grow and to become tender. Dark spots or blotches (chloasma or mask of pregnancy) may develop on your face. A dark line from your belly button to the pubic area (linea nigra) may appear. You may have changes in your hair. These can include thickening of your hair, rapid growth, and changes in texture. Some people also have hair loss during or after pregnancy, or hair that feels dry or thin. Health changes You may develop headaches. You may have heartburn. You may develop constipation. You may develop hemorrhoids or swollen, bulging veins (varicose veins). Your gums may bleed and may be sensitive to brushing and flossing. You may urinate more often because the fetus is pressing on your bladder. You may have back pain. This is caused by: Weight gain. Pregnancy hormones that are relaxing the joints in your  pelvis. A shift in weight and the muscles that support your balance. Follow these instructions at home: Medicines Follow your health care provider's instructions regarding medicine use. Specific medicines may be either safe or unsafe to take during pregnancy. Do not take any medicines unless approved by your health care provider. Take a prenatal vitamin that contains at least 600 micrograms (mcg) of folic acid. Eating and drinking Eat a healthy diet that includes fresh fruits and vegetables, whole grains, good sources of protein such as meat, eggs, or tofu, and low-fat dairy products. Avoid raw meat and unpasteurized juice, milk, and cheese. These carry germs that can harm you and your baby. You may need to take these actions to prevent or treat constipation: Drink enough fluid to keep your urine pale yellow. Eat foods that are high in fiber, such as beans, whole grains, and fresh fruits and vegetables. Limit foods that are high in fat and processed sugars, such as fried or sweet foods. Activity Exercise only as directed by your health care provider. Most people can continue their usual exercise routine during pregnancy. Try to exercise for 30 minutes at least 5 days a week. Stop exercising if you develop contractions in your uterus. Stop exercising if you develop pain or cramping in the lower abdomen or lower back. Avoid exercising if it is very hot or humid or if you are at a high altitude. Avoid heavy lifting. If you choose to, you may have sex unless your health care provider tells you not to. Relieving pain and discomfort Wear a supportive  bra to prevent discomfort from breast tenderness. Take warm sitz baths to soothe any pain or discomfort caused by hemorrhoids. Use hemorrhoid cream if your health care provider approves. Rest with your legs raised (elevated) if you have leg cramps or low back pain. If you develop varicose veins: Wear support hose as told by your health care  provider. Elevate your feet for 15 minutes, 3-4 times a day. Limit salt in your diet. Safety Wear your seat belt at all times when driving or riding in a car. Talk with your health care provider if someone is verbally or physically abusive to you. Lifestyle Do not use hot tubs, steam rooms, or saunas. Do not douche. Do not use tampons or scented sanitary pads. Avoid cat litter boxes and soil used by cats. These carry germs that can cause birth defects in the baby and possibly loss of the fetus by miscarriage or stillbirth. Do not use herbal remedies, alcohol, illegal drugs, or medicines that are not approved by your health care provider. Chemicals in these products can harm your baby. Do not use any products that contain nicotine or tobacco, such as cigarettes, e-cigarettes, and chewing tobacco. If you need help quitting, ask your health care provider. General instructions During a routine prenatal visit, your health care provider will do a physical exam and other tests. He or she will also discuss your overall health. Keep all follow-up visits. This is important. Ask your health care provider for a referral to a local prenatal education class. Ask for help if you have counseling or nutritional needs during pregnancy. Your health care provider can offer advice or refer you to specialists for help with various needs. Where to find more information American Pregnancy Association: americanpregnancy.[place] and Gynecologists: PoolDevices.com.pt Office on [employer] Health: KeywordPortfolios.com.br Contact a health care provider if you have: A headache that does not go away when you take medicine. Vision changes or you see spots in front of your eyes. Mild pelvic cramps, pelvic pressure, or nagging pain in the abdominal area. Persistent nausea, vomiting, or diarrhea. A bad-smelling vaginal discharge or foul-smelling urine. Pain when you  urinate. Sudden or extreme swelling of your face, hands, ankles, feet, or legs. A fever. Get help right away if you: Have fluid leaking from your vagina. Have spotting or bleeding from your vagina. Have severe abdominal cramping or pain. Have difficulty breathing. Have chest pain. Have fainting spells. Have not felt your baby move for the time period told by your health care provider. Have new or increased pain, swelling, or redness in an arm or leg. Summary The second trimester of pregnancy is from week 13 through week 27 (months 4 through 6). Do not use herbal remedies, alcohol, illegal drugs, or medicines that are not approved by your health care provider. Chemicals in these products can harm your baby. Exercise only as directed by your health care provider. Most people can continue their usual exercise routine during pregnancy. Keep all follow-up visits. This is important. This information is not intended to replace advice given to you by your health care provider. Make sure you discuss any questions you have with your health care provider. Document Revised: 08/07/2019 Document Reviewed: 06/13/2019 Elsevier Patient Education  [place].  Contraception Choices Contraception, also called birth control, refers to methods or devices that prevent pregnancy. Hormonal methods  Contraceptive implant A contraceptive implant is a thin, plastic tube that contains a hormone that prevents pregnancy. It is different from an intrauterine device (  IUD). It is inserted into the upper part of the arm by a health care provider. Implants can be effective for up to 3 years. Progestin-only injections Progestin-only injections are injections of progestin, a synthetic form of the hormone progesterone. They are given every 3 months by a health care provider. Birth control pills Birth control pills are pills that contain hormones that prevent pregnancy. They must be taken once a day, preferably at  the same time each day. A prescription is needed to use this method of contraception. Birth control patch The birth control patch contains hormones that prevent pregnancy. It is placed on the skin and must be changed once a week for three weeks and removed on the fourth week. A prescription is needed to use this method of contraception. Vaginal ring A vaginal ring contains hormones that prevent pregnancy. It is placed in the vagina for three weeks and removed on the fourth week. After that, the process is repeated with a new ring. A prescription is needed to use this method of contraception. Emergency contraceptive Emergency contraceptives prevent pregnancy after unprotected sex. They come in pill form and can be taken up to 5 days after sex. They work best the sooner they are taken after having sex. Most emergency contraceptives are available without a prescription. This method should not be used as your only form of birth control. Barrier methods  Female condom A female condom is a thin sheath that is worn over the penis during sex. Condoms keep sperm from going inside a woman's body. They can be used with a sperm-killing substance (spermicide) to increase their effectiveness. They should be thrown away after one use. Female condom A female condom is a soft, loose-fitting sheath that is put into the vagina before sex. The condom keeps sperm from going inside a woman's body. They should be thrown away after one use. Diaphragm A diaphragm is a soft, dome-shaped barrier. It is inserted into the vagina before sex, along with a spermicide. The diaphragm blocks sperm from entering the uterus, and the spermicide kills sperm. A diaphragm should be left in the vagina for 6-8 hours after sex and removed within 24 hours. A diaphragm is prescribed and fitted by a health care provider. A diaphragm should be replaced every 1-2 years, after giving birth, after gaining more than 15 lb (6.8 kg), and after pelvic  surgery. Cervical cap A cervical cap is a round, soft latex or plastic cup that fits over the cervix. It is inserted into the vagina before sex, along with spermicide. It blocks sperm from entering the uterus. The cap should be left in place for 6-8 hours after sex and removed within 48 hours. A cervical cap must be prescribed and fitted by a health care provider. It should be replaced every 2 years. Sponge A sponge is a soft, circular piece of polyurethane foam with spermicide in it. The sponge helps block sperm from entering the uterus, and the spermicide kills sperm. To use it, you make it wet and then insert it into the vagina. It should be inserted before sex, left in for at least 6 hours after sex, and removed and thrown away within 30 hours. Spermicides Spermicides are chemicals that kill or block sperm from entering the cervix and uterus. They can come as a cream, jelly, suppository, foam, or tablet. A spermicide should be inserted into the vagina with an applicator at least 53-97 minutes before sex to allow time for it to work. The process must be  repeated every time you have sex. Spermicides do not require a prescription. Intrauterine contraception Intrauterine device (IUD) An IUD is a T-shaped device that is put in a woman's uterus. There are two types: Hormone IUD.This type contains progestin, a synthetic form of the hormone progesterone. This type can stay in place for 3-5 years. Copper IUD.This type is wrapped in copper wire. It can stay in place for 10 years. Permanent methods of contraception Female tubal ligation In this method, a woman's fallopian tubes are sealed, tied, or blocked during surgery to prevent eggs from traveling to the uterus. Hysteroscopic sterilization In this method, a small, flexible insert is placed into each fallopian tube. The inserts cause scar tissue to form in the fallopian tubes and block them, so sperm cannot reach an egg. The procedure takes about 3  months to be effective. Another form of birth control must be used during those 3 months. Female sterilization This is a procedure to tie off the tubes that carry sperm (vasectomy). After the procedure, the man can still ejaculate fluid (semen). Another form of birth control must be used for 3 months after the procedure. Natural planning methods Natural family planning In this method, a couple does not have sex on days when the woman could become pregnant. Calendar method In this method, the woman keeps track of the length of each menstrual cycle, identifies the days when pregnancy can happen, and does not have sex on those days. Ovulation method In this method, a couple avoids sex during ovulation. Symptothermal method This method involves not having sex during ovulation. The woman typically checks for ovulation by watching changes in her temperature and in the consistency of cervical mucus. Post-ovulation method In this method, a couple waits to have sex until after ovulation. Where to find more information Centers for Disease Control and Prevention: http://www.wolf.info/ Summary Contraception, also called birth control, refers to methods or devices that prevent pregnancy. Hormonal methods of contraception include implants, injections, pills, patches, vaginal rings, and emergency contraceptives. Barrier methods of contraception can include female condoms, female condoms, diaphragms, cervical caps, sponges, and spermicides. There are two types of IUDs (intrauterine devices). An IUD can be put in a woman's uterus to prevent pregnancy for 3-5 years. Permanent sterilization can be done through a procedure for males and females. Natural family planning methods involve nothaving sex on days when the woman could become pregnant. This information is not intended to replace advice given to you by your health care provider. Make sure you discuss any questions you have with your health care provider. Document  Revised: 08/05/2019 Document Reviewed: 08/05/2019 Elsevier Patient Education  [place].

## 2022-02-23 ENCOUNTER — Encounter: Payer: Self-pay | Admitting: Family Medicine

## 2022-02-23 LAB — CBC/D/PLT+RPR+RH+ABO+RUBIGG...
Antibody Screen: NEGATIVE
Basophils Absolute: 0 10*3/uL (ref 0.0–0.2)
Basos: 0 %
EOS (ABSOLUTE): 0.2 10*3/uL (ref 0.0–0.4)
Eos: 2 %
HCV Ab: NONREACTIVE
HIV Screen 4th Generation wRfx: REACTIVE
Hematocrit: 40.9 % (ref 34.0–46.6)
Hemoglobin: 13.4 g/dL (ref 11.1–15.9)
Hepatitis B Surface Ag: NEGATIVE
Immature Grans (Abs): 0 10*3/uL (ref 0.0–0.1)
Immature Granulocytes: 0 %
Lymphocytes Absolute: 1.5 10*3/uL (ref 0.7–3.1)
Lymphs: 14 %
MCH: 29.4 pg (ref 26.6–33.0)
MCHC: 32.8 g/dL (ref 31.5–35.7)
MCV: 90 fL (ref 79–97)
Monocytes Absolute: 0.6 10*3/uL (ref 0.1–0.9)
Monocytes: 6 %
Neutrophils Absolute: 8.1 10*3/uL — ABNORMAL HIGH (ref 1.4–7.0)
Neutrophils: 78 %
Platelets: 289 10*3/uL (ref 150–450)
RBC: 4.56 x10E6/uL (ref 3.77–5.28)
RDW: 13 % (ref 11.7–15.4)
RPR Ser Ql: NONREACTIVE
Rh Factor: POSITIVE
Rubella Antibodies, IGG: <0.9 index — ABNORMAL LOW (ref >0.99)
WBC: 10.3 10*3/uL (ref 3.4–10.8)

## 2022-02-23 LAB — HIV 1/2 AB DIFFERENTIATION
HIV 1 Ab: REACTIVE
HIV 2 Ab: NONREACTIVE
NOTE (HIV CONF MULTIP: POSITIVE — AB

## 2022-02-23 LAB — PROTEIN / CREATININE RATIO, URINE
Creatinine, Urine: 97.3 mg/dL
Protein, Ur: 17.3 mg/dL
Protein/Creat Ratio: 178 mg/g creat (ref 0–200)

## 2022-02-23 LAB — COMPREHENSIVE METABOLIC PANEL
ALT: 21 IU/L (ref 0–32)
AST: 22 IU/L (ref 0–40)
Albumin/Globulin Ratio: 1.3 (ref 1.2–2.2)
Albumin: 4.4 g/dL (ref 3.9–4.9)
Alkaline Phosphatase: 109 IU/L (ref 44–121)
BUN/Creatinine Ratio: 9 (ref 9–23)
BUN: 8 mg/dL (ref 6–20)
Bilirubin Total: 0.4 mg/dL (ref 0.0–1.2)
CO2: 19 mmol/L — ABNORMAL LOW (ref 20–29)
Calcium: 10 mg/dL (ref 8.7–10.2)
Chloride: 97 mmol/L (ref 96–106)
Creatinine, Ser: 0.91 mg/dL (ref 0.57–1.00)
Globulin, Total: 3.5 g/dL (ref 1.5–4.5)
Glucose: 87 mg/dL (ref 70–99)
Potassium: 4.1 mmol/L (ref 3.5–5.2)
Sodium: 132 mmol/L — ABNORMAL LOW (ref 134–144)
Total Protein: 7.9 g/dL (ref 6.0–8.5)
eGFR: 86 mL/min/{1.73_m2} (ref >59)

## 2022-02-23 LAB — HEMOGLOBIN A1C
Est. average glucose Bld gHb Est-mCnc: 111 mg/dL
Hgb A1c MFr Bld: 5.5 % (ref 4.8–5.6)

## 2022-02-23 LAB — HCV INTERPRETATION

## 2022-02-25 DIAGNOSIS — R8271 Bacteriuria: Secondary | ICD-10-CM | POA: Insufficient documentation

## 2022-02-25 DIAGNOSIS — O99891 Other specified diseases and conditions complicating pregnancy: Secondary | ICD-10-CM | POA: Insufficient documentation

## 2022-02-25 LAB — URINE CULTURE, OB REFLEX

## 2022-02-25 LAB — CULTURE, OB URINE

## 2022-02-25 MED ORDER — NITROFURANTOIN MONOHYD MACRO 100 MG PO CAPS: 100.0000 mg | ORAL_CAPSULE | Freq: Two times a day (BID) | ORAL | 0 refills | Status: DC

## 2022-02-25 NOTE — Addendum Note (Signed)
Addended by: Clayton Lefort on: 02/25/2022 12:33 PM   Modules accepted: Orders

## 2022-02-26 ENCOUNTER — Encounter: Payer: Self-pay | Admitting: Family Medicine

## 2022-02-27 LAB — PANORAMA PRENATAL TEST FULL PANEL:PANORAMA TEST PLUS 5 ADDITIONAL MICRODELETIONS: FETAL FRACTION: 3.9

## 2022-02-28 DIAGNOSIS — R69 Illness, unspecified: Secondary | ICD-10-CM | POA: Diagnosis not present

## 2022-02-28 DIAGNOSIS — R030 Elevated blood-pressure reading, without diagnosis of hypertension: Secondary | ICD-10-CM | POA: Diagnosis not present

## 2022-02-28 DIAGNOSIS — D8481 Immunodeficiency due to conditions classified elsewhere: Secondary | ICD-10-CM | POA: Diagnosis not present

## 2022-02-28 LAB — CYTOLOGY - PAP
Chlamydia: NEGATIVE
Comment: NEGATIVE
Comment: NEGATIVE
Comment: NEGATIVE
Comment: NORMAL
Diagnosis: NEGATIVE
High risk HPV: NEGATIVE
Neisseria Gonorrhea: NEGATIVE
Trichomonas: NEGATIVE

## 2022-03-04 ENCOUNTER — Other Ambulatory Visit (HOSPITAL_COMMUNITY): Payer: Self-pay

## 2022-03-08 ENCOUNTER — Other Ambulatory Visit (HOSPITAL_COMMUNITY): Payer: Self-pay

## 2022-03-08 ENCOUNTER — Other Ambulatory Visit: Payer: Self-pay

## 2022-03-09 ENCOUNTER — Telehealth: Payer: Self-pay

## 2022-03-09 NOTE — Telephone Encounter (Signed)
[facility] Patient Advocate Encounter  Patient's medication [name]) have been couriered to [facility] from [facility] and will be administered on the patient next office visit on 03/15/22.  [name] , [facility] Patient [facility] Phone: [phone] Fax:  [phone]

## 2022-03-09 NOTE — Progress Notes (Signed)
Alert received through [facility]Notify system as follows:    Chart review: No blood pressure follow up documented. Patient scheduled for routine follow up on 03/23/22; care being transferred to [place] office. Will be seen by infectious disease on 03/16/22.  Patient information submitted to IT team to transfer [place] Notify alert to new location  Annabell Howells, RN 03/09/2022  10:30 AM

## 2022-03-16 ENCOUNTER — Ambulatory Visit (INDEPENDENT_AMBULATORY_CARE_PROVIDER_SITE_OTHER): Payer: 59 | Admitting: Pharmacist

## 2022-03-16 ENCOUNTER — Other Ambulatory Visit: Payer: Self-pay

## 2022-03-16 ENCOUNTER — Other Ambulatory Visit (HOSPITAL_COMMUNITY): Payer: Self-pay

## 2022-03-16 DIAGNOSIS — B20 Human immunodeficiency virus [HIV] disease: Secondary | ICD-10-CM | POA: Diagnosis not present

## 2022-03-16 DIAGNOSIS — R69 Illness, unspecified: Secondary | ICD-10-CM | POA: Diagnosis not present

## 2022-03-16 MED ORDER — CABOTEGRAVIR & RILPIVIRINE ER 400 & 600 MG/2ML IM SUER
1.0000 | INTRAMUSCULAR | 5 refills | Status: DC
  Filled 2022-03-16 – 2022-05-06 (×2): qty 4, 30d supply, fill #0
  Filled 2022-06-15: qty 4, 30d supply, fill #1
  Filled 2022-07-14: qty 4, 30d supply, fill #2

## 2022-03-16 MED ORDER — CABOTEGRAVIR & RILPIVIRINE ER 600 & 900 MG/3ML IM SUER
1.0000 | Freq: Once | INTRAMUSCULAR | Status: AC
  Administered 2022-03-16: 1 via INTRAMUSCULAR

## 2022-03-16 NOTE — Progress Notes (Signed)
HPI: Kimberly Bishop is a 32 y.o. female who presents to the [facility] for [facility] administration.  Patient Active Problem List   Diagnosis Date Noted   Asymptomatic bacteriuria during pregnancy 02/25/2022   Elevated blood pressure affecting pregnancy in first trimester, antepartum 02/22/2022   Supervision of high risk pregnancy, antepartum 02/08/2022   Vitamin D deficiency 07/22/2021   Nephrolithiasis 01/18/2021   AIDS (acquired immunodeficiency syndrome), CD4 <=200 ([place])    History of cesarean delivery 04/29/2019   History of preterm delivery 04/29/2019   History of pre-eclampsia in prior pregnancy, currently pregnant in first trimester 03/29/2019   HIV (human immunodeficiency virus infection) ([place]) 03/29/2019   History of intrauterine growth restriction and stillbirth, currently pregnant 03/26/2019   Rubella non-immune status, antepartum 02/15/2019   CIN I (cervical intraepithelial neoplasia I) 11/28/2018   ASCUS with positive high risk HPV cervical 11/26/2018   Molluscum contagiosum 03/22/2016    Patient's Medications  New Prescriptions   No medications on file  Previous Medications   BLOOD PRESSURE MONITORING (BLOOD PRESSURE KIT) DEVI    1 Device by Does not apply route once a week.   CABOTEGRAVIR & RILPIVIRINE ER (CABENUVA) 600 & 900 MG/3ML INJECTION    Inject 1 kit into the muscle every 2 (two) months.   NITROFURANTOIN, MACROCRYSTAL-MONOHYDRATE, (MACROBID) 100 MG CAPSULE    Take 1 capsule (100 mg total) by mouth 2 (two) times daily.   PRENATAL MV & MIN W/FA-DHA (PRENATAL ADULT GUMMY/DHA/FA PO)    Take by mouth daily.   SQ INJECTION LENACAPAVIR (SUNLENCA) 463.5 MG/1.5ML SQ INJECTION    Inject 3 mLs (927 mg total) into the skin every 6 (six) months. Administer each injection subcutaneously at separate sites in the abdomen (more or equal to 2 inches from the navel).  Modified Medications   No medications on file  Discontinued Medications   No medications on  file    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 ([place])    History of kidney stones    HIV (human immunodeficiency virus infection) ([place])    Hypertension    Immune deficiency disorder ([place])    Infection    UTI   Kidney stones     Social History: Social History   Socioeconomic History   Marital status: Married    Spouse name: Not on file   Number of children: Not on file   Years of education: Not on file   Highest education level: Not on file  Occupational History    Comment: unemployed  Tobacco Use   Smoking status: Never   Smokeless tobacco: Never  Vaping Use   Vaping Use: Never used  Substance and Sexual Activity   Alcohol use: Not Currently    Comment: occasional -    Drug use: Not Currently    Types: Marijuana    Comment: last use 08/24/18   Sexual activity: Yes    Partners: Male    Birth control/protection: None, Implant    Comment: declined condoms  Other Topics Concern   Not on file  Social History Narrative   Not on file   Social Determinants of Health   Financial Resource Strain: Not on file  Food Insecurity: No Food Insecurity (02/22/2022)   Hunger Vital Sign    Worried About Running Out of Food in the Last Year: Never true    Ran Out of Food in the Last Year:  Never true  Transportation Needs: No Transportation Needs (02/22/2022)   PRAPARE - Hydrologist (Medical): No    Lack of Transportation (Non-Medical): No  Physical Activity: Not on file  Stress: Not on file  Social Connections: Not on file    Labs: Lab Results  Component Value Date   HIV1RNAQUANT NOT DETECTED 01/19/2022   HIV1RNAQUANT Not Detected 11/17/2021   HIV1RNAQUANT 25 (H) 10/20/2021   CD4TABS 283 (L) 10/20/2021   CD4TABS 161 (L) 07/20/2021   CD4TABS 173 (L) 06/22/2021    RPR and STI Lab Results  Component Value Date   LABRPR Non  Reactive 02/22/2022   LABRPR NON-REACTIVE 02/18/2021   LABRPR NON-REACTIVE 10/13/2020   LABRPR NON REACTIVE 03/26/2019   LABRPR Non Reactive 01/31/2019    STI Results GC GC CT CT  Latest Ref Rng & Units  NEGATIVE  NEGATIVE  02/22/2022 10:49 AM Negative   Negative    08/18/2021  4:45 PM Negative   Negative    10/13/2020  9:03 AM Negative   Negative    03/26/2019 12:43 PM Negative   Negative    10/18/2018 12:00 AM Negative   Negative    05/25/2018 12:00 AM Negative   **POSITIVE**    03/28/2017 12:00 AM Negative   Negative    02/11/2017 12:00 AM Negative   Negative    07/07/2016 12:00 AM Negative   Negative    10/27/2015 12:00 AM Negative   Negative    02/07/2015 12:00 AM Negative   **POSITIVE**    05/19/2014 12:00 AM NG: Negative   CT: Negative    12/23/2013  3:05 PM  NEGATIVE   NEGATIVE   05/16/2013 12:00 AM NG: Negative   CT: Negative    10/22/2012  5:48 PM  NEGATIVE   NEGATIVE   02/12/2012 10:52 PM  NEGATIVE   NEGATIVE     Hepatitis B Lab Results  Component Value Date   HEPBSAB NON-REACTIVE 09/10/2020   HEPBSAG Negative 02/22/2022   HEPBCAB NON-REACTIVE 09/10/2020   Hepatitis C Lab Results  Component Value Date   HEPCAB NON-REACTIVE 09/10/2020   Hepatitis A Lab Results  Component Value Date   HAV NEG 12/09/2011   Lipids: Lab Results  Component Value Date   CHOL 182 10/08/2020   TRIG 83 10/08/2020   HDL 42 (L) 10/08/2020   CHOLHDL 4.3 10/08/2020   VLDL 9 10/27/2015   LDLCALC 122 (H) 10/08/2020    TARGET DATE: Cabenuva - the 8th Sunlenca - due around Feb 9th  Assessment: Kimberly Bishop presents today for her maintenance Cabenuva injections. No issues at all with previous injections. Today is her last dose of the q2 month Cabenuva 600 mg/900 mg dose. In 2 months, she will begin the q57mCabenuva dose at 400 mg/600 mg. She is also due for her q6 month Sunlenca injection in February. She is starting a new job and will be training M-F 8am to 5pm the next 6 weeks but after  that, her schedule will be 1030am to 730pm M-F so will start scheduling her appointments in the morning time. The risk/benefit of Cabenuva and Sunlenca was discussed again at length today. Reminded her that there is a paucity of data regarding these injections in pregnancy. She is still good with the plan and wishes to continue with the course. Advised her that we would probably be checking her viral load at each visit from here on out as we are not sure how pregnancy affects Cabenuva and Sunlenca levels.  She is almost [redacted] weeks along, due in June.  Administered cabotegravir 653m/3mL in left upper outer quadrant of the gluteal muscle. Administered rilpivirine 900 mg/372min the right upper outer quadrant of the gluteal muscle. No issues with injections.  Plan: - Cabenuva injections administered - HIV viral load and CD4 count today - F/u with StColletta [place]n 04/20/22 for Sunlenca injection - F/u with me on 05/17/22 for first monthly Cabenuva injection - F/u with StColletta [place]n 06/21/22 for monthly Cabenuva injection - Call with any issues or questions  Annissa Andreoni L. Chrystine Frogge, PharmD, BCIDP, AAHIVP, CPLawrence Creeklinical Pharmacist Practitioner [place]ious [facility]

## 2022-03-18 LAB — T-HELPER CELLS (CD4) COUNT (NOT AT ARMC)
CD4 % Helper T Cell: 25 % — ABNORMAL LOW (ref 33–65)
CD4 T Cell Abs: 348 /uL — ABNORMAL LOW (ref 400–1790)

## 2022-03-19 LAB — HIV-1 RNA QUANT-NO REFLEX-BLD
HIV 1 RNA Quant: NOT DETECTED Copies/mL
HIV-1 RNA Quant, Log: NOT DETECTED Log cps/mL

## 2022-03-22 ENCOUNTER — Telehealth: Payer: Self-pay

## 2022-03-22 ENCOUNTER — Other Ambulatory Visit (HOSPITAL_COMMUNITY): Payer: Self-pay

## 2022-03-22 NOTE — Telephone Encounter (Signed)
[facility] Patient Advocate Encounter   Received notification from [facility] that prior authorization for [facility] is required.   PA submitted on 03/22/2022 Key [id number] Status is pending    [facility] will continue to follow.   [name], [facility] Patient [facility] for Infectious Disease Phone: [phone] Fax:  [phone]

## 2022-03-23 ENCOUNTER — Encounter: Payer: Self-pay | Admitting: General Practice

## 2022-03-23 ENCOUNTER — Encounter: Payer: Self-pay | Admitting: Family Medicine

## 2022-03-23 ENCOUNTER — Ambulatory Visit (INDEPENDENT_AMBULATORY_CARE_PROVIDER_SITE_OTHER): Payer: 59 | Admitting: Family Medicine

## 2022-03-23 ENCOUNTER — Other Ambulatory Visit (HOSPITAL_COMMUNITY): Payer: Self-pay

## 2022-03-23 VITALS — BP 121/74 | HR 91 | Wt 145.0 lb

## 2022-03-23 DIAGNOSIS — O0992 Supervision of high risk pregnancy, unspecified, second trimester: Secondary | ICD-10-CM | POA: Diagnosis not present

## 2022-03-23 DIAGNOSIS — Z2839 Other underimmunization status: Secondary | ICD-10-CM

## 2022-03-23 DIAGNOSIS — O09899 Supervision of other high risk pregnancies, unspecified trimester: Secondary | ICD-10-CM

## 2022-03-23 DIAGNOSIS — O09892 Supervision of other high risk pregnancies, second trimester: Secondary | ICD-10-CM

## 2022-03-23 DIAGNOSIS — Z3A15 15 weeks gestation of pregnancy: Secondary | ICD-10-CM

## 2022-03-23 DIAGNOSIS — O161 Unspecified maternal hypertension, first trimester: Secondary | ICD-10-CM

## 2022-03-23 DIAGNOSIS — Z8751 Personal history of pre-term labor: Secondary | ICD-10-CM

## 2022-03-23 DIAGNOSIS — O09291 Supervision of pregnancy with other poor reproductive or obstetric history, first trimester: Secondary | ICD-10-CM

## 2022-03-23 DIAGNOSIS — B20 Human immunodeficiency virus [HIV] disease: Secondary | ICD-10-CM

## 2022-03-23 DIAGNOSIS — N870 Mild cervical dysplasia: Secondary | ICD-10-CM

## 2022-03-23 DIAGNOSIS — O0990 Supervision of high risk pregnancy, unspecified, unspecified trimester: Secondary | ICD-10-CM

## 2022-03-23 DIAGNOSIS — Z98891 History of uterine scar from previous surgery: Secondary | ICD-10-CM

## 2022-03-23 MED ORDER — ASPIRIN 81 MG PO TBEC: 81.0000 mg | DELAYED_RELEASE_TABLET | Freq: Every day | ORAL | 2 refills | Status: DC

## 2022-03-23 NOTE — Progress Notes (Signed)
   PRENATAL VISIT NOTE  Subjective:  Kimberly Bishop is a 32 y.o. [phone] at 66w6dbeing seen today for ongoing prenatal care.  She is currently monitored for the following issues for this high-risk pregnancy and has Molluscum contagiosum; ASCUS with positive high risk HPV cervical; CIN I (cervical intraepithelial neoplasia I); Rubella non-immune status, antepartum; History of intrauterine growth restriction and stillbirth, currently pregnant; History of pre-eclampsia in prior pregnancy, currently pregnant in first trimester; HIV (human immunodeficiency virus infection) ([place]; History of cesarean delivery; History of preterm delivery; AIDS (acquired immunodeficiency syndrome), CD4 <=200 ([place]; Nephrolithiasis; Vitamin D deficiency; Supervision of high risk pregnancy, antepartum; Elevated blood pressure affecting pregnancy in first trimester, antepartum; and Asymptomatic bacteriuria during pregnancy on their problem list.  Patient reports no complaints.  Contractions: Not present. Vag. Bleeding: None.  Movement: Absent. Denies leaking of fluid.   The following portions of the patient's history were reviewed and updated as appropriate: allergies, current medications, past family history, past medical history, past social history, past surgical history and problem list.   Objective:   Vitals:   03/23/22 1615  BP: 121/74  Pulse: 91  Weight: 145 lb (65.8 kg)    Fetal Status: Fetal Heart Rate (bpm): 154   Movement: Absent     General:  Alert, oriented and cooperative. Patient is in no acute distress.  Skin: Skin is warm and dry. No rash noted.   Cardiovascular: Normal heart rate noted  Respiratory: Normal respiratory effort, no problems with respiration noted  Abdomen: Soft, gravid, appropriate for gestational age.  Pain/Pressure: Absent     Pelvic: Cervical exam deferred        Extremities: Normal range of motion.  Edema: None  Mental Status: Normal mood and affect. Normal behavior. Normal  judgment and thought content.   Assessment and Plan:  Pregnancy: [id number] 130w6d. [redacted] weeks gestation of pregnancy  2. Supervision of high risk pregnancy, antepartum FHT and FH normal  3. Currently asymptomatic HIV infection, with history of HIV-related illness (HCLos OsosCurrently on injectable medication. No side effects. Undetectable viral load. Continue with injections  4. Elevated blood pressure affecting pregnancy in first trimester, antepartum BP normal today - will watch  5. History of cesarean delivery Desires VBAC.  First c/s for high viral load.  6. Rubella non-immune status, antepartum MMR post delivery  7. History of preterm delivery  8. CIN I (cervical intraepithelial neoplasia I) Last PAP normal. Rpt yearly.  9. History of pre-eclampsia in prior pregnancy, currently pregnant in first trimester Start ASA '81mg'$ .  Preterm labor symptoms and general obstetric precautions including but not limited to vaginal bleeding, contractions, leaking of fluid and fetal movement were reviewed in detail with the patient. Please refer to After Visit Summary for other counseling recommendations.   No follow-ups on file.  Future Appointments  Date Time Provider DeParshall1/15/2024  4:00 PM CWH-WMHP NURSE [facility] None  04/14/2022  9:15 AM [facility]URSE [facility]  9:30 AM [facility]MFC US2 [facility]09[facility]2022  1:10 PM HaLavonia DraftsMD CWH-WMHP None  04/20/2022  4:00 PM DiRaleigh CallasNP [facility]ID [facility]  05/17/2022  9:00 AM Kuppelweiser, CaGillian ShieldsRPH-CPP RCID-RCID [facility]  05/19/2022  9:15 AM StTruett MainlandDO CWH-WMHP None  06/21/2022  8:45 AM DiRaleigh CallasNP RCID-[facility]ID RCID    JaTruett MainlandDO

## 2022-03-24 ENCOUNTER — Other Ambulatory Visit (HOSPITAL_COMMUNITY): Payer: Self-pay

## 2022-03-24 ENCOUNTER — Encounter (INDEPENDENT_AMBULATORY_CARE_PROVIDER_SITE_OTHER): Payer: 59

## 2022-03-24 DIAGNOSIS — B20 Human immunodeficiency virus [HIV] disease: Secondary | ICD-10-CM

## 2022-03-24 DIAGNOSIS — B029 Zoster without complications: Secondary | ICD-10-CM

## 2022-03-25 MED ORDER — VALACYCLOVIR HCL 1 G PO TABS: 1000.0000 mg | ORAL_TABLET | Freq: Three times a day (TID) | ORAL | 0 refills | Status: AC

## 2022-03-25 MED ORDER — VALACYCLOVIR HCL 1 G PO TABS: 1000.0000 mg | ORAL_TABLET | Freq: Three times a day (TID) | ORAL | 0 refills | Status: DC

## 2022-03-25 NOTE — Addendum Note (Signed)
Addended by: [place] Callas on: 03/25/2022 01:10 PM   Modules accepted: Orders

## 2022-03-25 NOTE — Telephone Encounter (Signed)
Please see the MyChart message reply(ies) for my assessment and plan.    This patient gave consent for this Medical Advice Message and is aware that it may result in a bill to [employer], as well as the possibility of receiving a bill for a co-payment or deductible. They are an established patient, but are not seeking medical advice exclusively about a problem treated during an in person or video visit in the last seven days. I did not recommend an in person or video visit within seven days of my reply.    I spent a total of 5 minutes cumulative time within 7 days through [employer].  Though acyclovir is preferred in pregnancy, she will not be successful with 5x a day option   Janene Madeira, NP

## 2022-03-28 ENCOUNTER — Ambulatory Visit (INDEPENDENT_AMBULATORY_CARE_PROVIDER_SITE_OTHER): Payer: 59

## 2022-03-28 DIAGNOSIS — Z3A16 16 weeks gestation of pregnancy: Secondary | ICD-10-CM

## 2022-03-28 DIAGNOSIS — Z3A17 17 weeks gestation of pregnancy: Secondary | ICD-10-CM

## 2022-03-28 DIAGNOSIS — O0992 Supervision of high risk pregnancy, unspecified, second trimester: Secondary | ICD-10-CM

## 2022-03-28 DIAGNOSIS — O0990 Supervision of high risk pregnancy, unspecified, unspecified trimester: Secondary | ICD-10-CM

## 2022-03-28 NOTE — Progress Notes (Signed)
Patient presents for AFP lab. Patient was sent to the lab to have lab drawn. Raiya Stainback l Cornie Herrington, CMA

## 2022-03-29 ENCOUNTER — Encounter: Payer: Self-pay | Admitting: Family Medicine

## 2022-03-30 MED ORDER — CEFADROXIL 500 MG PO CAPS: 500.0000 mg | ORAL_CAPSULE | Freq: Two times a day (BID) | ORAL | 0 refills | Status: DC

## 2022-04-05 ENCOUNTER — Other Ambulatory Visit (HOSPITAL_COMMUNITY): Payer: Self-pay

## 2022-04-06 ENCOUNTER — Telehealth: Payer: Self-pay | Admitting: Pharmacist

## 2022-04-06 NOTE — Telephone Encounter (Signed)
Completed and faxed appeal for [facility]. Will follow up on decision with patient's insurance.   Ameya Vowell L. Riad Wagley, PharmD, BCIDP, AAHIVP, CPP Clinical Pharmacist Practitioner Infectious Diseases [facility] for Infectious Disease 04/06/2022, 11:54 AM

## 2022-04-12 ENCOUNTER — Telehealth: Payer: Self-pay

## 2022-04-12 NOTE — Telephone Encounter (Signed)
She's got a big window but we wanted to be earlier rather than later.

## 2022-04-12 NOTE — Telephone Encounter (Signed)
[facility] Patient Advocate Encounter  I called [employer] '@[phone] I was told the PA Appeal is still in review they have until 05/05/22 , I explained to the rep it was a 6 month injection and it is time for the pt to have her 2nd injection on 04/20/22. She then told me the Dr can do a Peer to Peer and explain that the patient will need the medication to keep up for continuation of care.  Appeal Case ID # [id number]  Peer to Peer Phone # [phone]  Kimberly Bishop, [place] Patient [facility] for Infectious Disease Phone: [phone] Fax:  [phone]

## 2022-04-12 NOTE — Telephone Encounter (Signed)
Kimberly Bishop - please see this above. I wrote an appeal but it is still in process. s

## 2022-04-14 ENCOUNTER — Encounter: Payer: Self-pay | Admitting: *Deleted

## 2022-04-14 ENCOUNTER — Other Ambulatory Visit: Payer: Self-pay | Admitting: *Deleted

## 2022-04-14 ENCOUNTER — Ambulatory Visit: Payer: 59 | Admitting: *Deleted

## 2022-04-14 ENCOUNTER — Ambulatory Visit: Payer: 59 | Attending: Family Medicine

## 2022-04-14 ENCOUNTER — Ambulatory Visit (HOSPITAL_BASED_OUTPATIENT_CLINIC_OR_DEPARTMENT_OTHER): Payer: 59 | Admitting: Maternal & Fetal Medicine

## 2022-04-14 VITALS — BP 129/69 | HR 84

## 2022-04-14 DIAGNOSIS — O162 Unspecified maternal hypertension, second trimester: Secondary | ICD-10-CM | POA: Diagnosis not present

## 2022-04-14 DIAGNOSIS — Z3A19 19 weeks gestation of pregnancy: Secondary | ICD-10-CM | POA: Insufficient documentation

## 2022-04-14 DIAGNOSIS — O98712 Human immunodeficiency virus [HIV] disease complicating pregnancy, second trimester: Secondary | ICD-10-CM | POA: Diagnosis present

## 2022-04-14 DIAGNOSIS — O3412 Maternal care for benign tumor of corpus uteri, second trimester: Secondary | ICD-10-CM

## 2022-04-14 DIAGNOSIS — O09899 Supervision of other high risk pregnancies, unspecified trimester: Secondary | ICD-10-CM

## 2022-04-14 DIAGNOSIS — O34219 Maternal care for unspecified type scar from previous cesarean delivery: Secondary | ICD-10-CM

## 2022-04-14 DIAGNOSIS — O0990 Supervision of high risk pregnancy, unspecified, unspecified trimester: Secondary | ICD-10-CM | POA: Diagnosis present

## 2022-04-14 DIAGNOSIS — Z362 Encounter for other antenatal screening follow-up: Secondary | ICD-10-CM

## 2022-04-14 DIAGNOSIS — B009 Herpesviral infection, unspecified: Secondary | ICD-10-CM | POA: Diagnosis not present

## 2022-04-14 DIAGNOSIS — Z8759 Personal history of other complications of pregnancy, childbirth and the puerperium: Secondary | ICD-10-CM

## 2022-04-14 DIAGNOSIS — R69 Illness, unspecified: Secondary | ICD-10-CM | POA: Diagnosis not present

## 2022-04-14 DIAGNOSIS — D259 Leiomyoma of uterus, unspecified: Secondary | ICD-10-CM

## 2022-04-14 NOTE — Progress Notes (Signed)
MFM Consult Note Patient Name: Kimberly Bishop  Patient MRN:   [id number]  Referring provider: [facility]  Reason for Consult: HIV in pregnancy   HPI: Kimberly Bishop is a 32 y.o. [id number] at 6w0dhere for ultrasound and consultation.   The patient has a history of HIV and follows with infectious disease.  Her viral load has been negative for over a year.  Her recent CD4 count was also over 350.  She denies any difficulties with compliance in her medication.  She is aware that her current medication is not well studied during pregnancy and an alternative is recommended.  However, she reports that the monthly injection is much more convenient to her and she has difficulty with daily pills.  Based on this risks versus benefits discussion she would like to elect to can continue her current medication (Kimberly Bishop.  We discussed of the various complications with a HIV in pregnancy and possible transmission to the fetus.  Since she has been well-controlled and is compliant with medications I anticipate she will have an uneventful pregnancy course.  We can plan for serial growth ultrasounds with delivery around 39 weeks as long as her viral load is negative.  We discussed avoiding unnecessary procedures such as artificial rupture membranes during labor to avoid increasing transmission to the fetus.  Cesarean delivery is only likely if the viral load were to go over 1000 which is very unlikely if she continues her medications.  Review of Systems: A review of systems was performed and was negative except per HPI   Vitals and Physical Exam 129/69, HR 84, BMI 24.5 Sitting comfortably on the sonogram table Nonlabored breathing Normal rate and rhythm Abdomen is nontender  Genetic testing: low risk female  Sonographic findings Single intrauterine pregnancy. Fetal cardiac activity:  Observed and appears normal. Presentation: Breech. The anatomic structures that were well seen appear normal without  evidence of soft markers. Due to poor acoustic windows, the visualization some structures remain suboptimally seen. Fetal biometry shows the estimated fetal weight at the 77 percentile. Amniotic fluid volume: Within normal limits. MVP: 5.89 cm. Placenta: Posterior.  Assessment - HIV in pregnancy Plan -Continue to f/u with infectious disease for management of HIV in pregnancy. After risks vs benefits discussion she elected to continue Kimberly Bishop  - Repeat a Viral load (HIV RNA) and CD4 cell counts per ID. This is usually every trimester and then again 34-36 gestational weeks. - Ensure vaccines are up to date (pneumococcal, hepatitis B, hepatitis A, flu, MMR, varicella, HPV, tDAP)Ensure vaccines are up to date (pneumococcal, hepatitis B, hepatitis A, flu, MMR, varicella, HPV and covid-19). - Optimize comorbidities (smoking cessation, treatment for opiate use disorders, treatment for viral hepatitis [B or C], management of diabetes, hypertension, cervical HPV) when appropriate. - Serial growth [place] - Chemoprophylaxis for opportunistic infections per ID - no apparent indications for chemoprophylaxis at this time. - Delivery route and timing based on viral load. Since her viral load has been undetectable for a year, it is likely she will be a candidate for SVD.  If the viral load is  >1000 copies/mL, a cesarean delivery is recommended at [redacted] weeks along with AZT 3 hours before delivery. If viral load is < 100 copies/mL, then intrapartum AZT and vaginal delivery are recommended as long as no other obstetric contraindication exit.  - Notify pediatric team at delivery for follow-up of an HIV-exposed neonate. - Minimize fetal risk with intrapartum exposures: avoid early AROM and fetal scalp electrode  if possible  I spent 45 minutes reviewing the patients chart, including labs and images as well as counseling the patient about her medical conditions.  Kimberly Bishop  MFM, [place]   04/14/2022  12:18 PM

## 2022-04-18 NOTE — Telephone Encounter (Signed)
Called peer to peer line provided, transferred several times but ended up to commercial pharmacy team regarding appeal and request for peer to peer.   At 33 min into the call we were able to schedule the peer to peer. Chris with [place] stated that we cannot do the peer to peer now that it is in appeal state - usually that's something done before, however this department does not do appeals.   [phone] Gerald Stabs) at [facility] requested we call back if we can get the denial letter patient received.   PA denial reviewed and denied because of lack of information. Only labs received.     Will see if Butch Penny has any suggestions about how to proceed. Will also ask Kenlei to bring denial letter with her this week at her appt with me.     Janene Madeira, MSN, NP-C [facility] for Infectious Disease [email or web address] Pager: [phone] Office: [phone] [facility] Main Line: [place] Communication Welcome

## 2022-04-20 ENCOUNTER — Ambulatory Visit (INDEPENDENT_AMBULATORY_CARE_PROVIDER_SITE_OTHER): Payer: Medicaid Other | Admitting: Infectious Diseases

## 2022-04-20 ENCOUNTER — Ambulatory Visit (INDEPENDENT_AMBULATORY_CARE_PROVIDER_SITE_OTHER): Payer: 59 | Admitting: Obstetrics & Gynecology

## 2022-04-20 ENCOUNTER — Encounter: Payer: Self-pay | Admitting: Obstetrics & Gynecology

## 2022-04-20 ENCOUNTER — Other Ambulatory Visit: Payer: Self-pay

## 2022-04-20 ENCOUNTER — Encounter: Payer: Self-pay | Admitting: Infectious Diseases

## 2022-04-20 VITALS — BP 124/70 | HR 97 | Wt 148.0 lb

## 2022-04-20 VITALS — BP 125/77 | HR 89 | Temp 98.2°F | Resp 16 | Wt 148.0 lb

## 2022-04-20 DIAGNOSIS — O98712 Human immunodeficiency virus [HIV] disease complicating pregnancy, second trimester: Secondary | ICD-10-CM

## 2022-04-20 DIAGNOSIS — O0990 Supervision of high risk pregnancy, unspecified, unspecified trimester: Secondary | ICD-10-CM

## 2022-04-20 DIAGNOSIS — O09299 Supervision of pregnancy with other poor reproductive or obstetric history, unspecified trimester: Secondary | ICD-10-CM

## 2022-04-20 DIAGNOSIS — Z3A19 19 weeks gestation of pregnancy: Secondary | ICD-10-CM | POA: Diagnosis not present

## 2022-04-20 DIAGNOSIS — Z8751 Personal history of pre-term labor: Secondary | ICD-10-CM

## 2022-04-20 DIAGNOSIS — B20 Human immunodeficiency virus [HIV] disease: Secondary | ICD-10-CM

## 2022-04-20 DIAGNOSIS — O34211 Maternal care for low transverse scar from previous cesarean delivery: Secondary | ICD-10-CM

## 2022-04-20 DIAGNOSIS — Z98891 History of uterine scar from previous surgery: Secondary | ICD-10-CM

## 2022-04-20 DIAGNOSIS — R87610 Atypical squamous cells of undetermined significance on cytologic smear of cervix (ASC-US): Secondary | ICD-10-CM

## 2022-04-20 DIAGNOSIS — N200 Calculus of kidney: Secondary | ICD-10-CM

## 2022-04-20 DIAGNOSIS — O09291 Supervision of pregnancy with other poor reproductive or obstetric history, first trimester: Secondary | ICD-10-CM

## 2022-04-20 DIAGNOSIS — O09899 Supervision of other high risk pregnancies, unspecified trimester: Secondary | ICD-10-CM

## 2022-04-20 MED ORDER — LENACAPAVIR SODIUM 463.5 MG/1.5ML ~~LOC~~ SOLN
927.0000 mg | Freq: Once | SUBCUTANEOUS | Status: AC
  Administered 2022-04-20: 927 mg via SUBCUTANEOUS

## 2022-04-20 NOTE — Progress Notes (Addendum)
Name: Kimberly Bishop  DOB: Jan 12, 1991 MRN: [id number] PCP: Patient, No Pcp Per    Brief Narrative:  Kimberly Bishop is a 32 y.o. 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 Started on some medications she thinks early adolescence but not quite clear. CD4 nadir < 50. No history of opportunistic infections to her knowledge aside from oral candidiasis.   Previous Regimens: Tivicay + Odefsey Biktarvy Tivicay + Truvada (pregnancy 10-2018) Cabenuva injections 02/2021 Sunlenca added 10-2021  Genotypes: 3/23 - abacavir, zidovudine resistance. No NNRTI or INSTI resistance   Subjective:   Chief Complaint  Patient presents with   Follow-up    B20      HPI: Kimberly Bishop is here for routine follow up for HIV care, currently pregnant @ 59w6dgestation. She reports feeling very good. She is hesitant to get her lencapivir injection in her abdomen given current pregnancy and growing abdomen.   She has been following with OB/GYN and MFM team. She has a h/o cesarean delivery d/t HTN and hopes to try for vaginal delivery. She has not given much thought to breastfeeding as she has always been told she could not d/t HIV infection. She overall feels very well and healthy.   Shingles rash resolved with valtrex course.       04/20/2022    4:10 PM  Depression screen PHQ 2/9  Decreased Interest 0  Down, Depressed, Hopeless 0  PHQ - 2 Score 0    Review of Systems  Constitutional:  Negative for chills and fever.  Respiratory:  Negative for cough.   Cardiovascular: Negative.   Gastrointestinal: Negative.   Genitourinary: Negative.   Musculoskeletal: Negative.   Skin:  Negative for rash.  Neurological: Negative.     Past Medical History:  Diagnosis Date   AIDS (acquired immunodeficiency syndrome), CD4 <=200 ([place]    History of kidney stones    HIV (human immunodeficiency virus infection) ([place]    Hypertension    Immune deficiency disorder (HM[place]    Infection    UTI   Kidney stones      Outpatient Medications Prior to Visit  Medication Sig Dispense Refill   aspirin EC 81 MG tablet Take 1 tablet (81 mg total) by mouth daily. Take after 12 weeks for prevention of preeclampsia later in pregnancy 300 tablet 2   Blood Pressure Monitoring (BLOOD PRESSURE KIT) DEVI 1 Device by Does not apply route once a week. 1 each 0   cabotegravir & rilpivirine ER (CABENUVA) 400 & 600 MG/2ML injection Inject 1 kit into the muscle every 30 (thirty) days. 4 mL 5   cabotegravir & rilpivirine ER (CABENUVA) 600 & 900 MG/3ML injection Inject 1 kit into the muscle every 2 (two) months. 6 mL 5   cefadroxil (DURICEF) 500 MG capsule Take 1 capsule (500 mg total) by mouth 2 (two) times daily. 14 capsule 0   Prenatal MV & Min w/FA-DHA (PRENATAL ADULT GUMMY/DHA/FA PO) Take by mouth daily.     SQ injection lenacapavir (SUNLENCA) 463.5 MG/1.5ML SQ injection Inject 3 mLs (927 mg total) into the skin every 6 (six) months. Administer each injection subcutaneously at separate sites in the abdomen (more or equal to 2 inches from the navel). 3 mL 1   No facility-administered medications prior to visit.     No Known Allergies  Social History   Tobacco Use   Smoking status: Never   Smokeless tobacco: Never  Vaping Use   Vaping Use: Never used  Substance Use Topics   Alcohol  use: Not Currently    Comment: occasional -    Drug use: Not Currently    Types: Marijuana    Comment: last use 08/24/18    Family History  Adopted: Yes  Problem Relation Age of Onset   Hypertension Mother    Autism Brother     Social History   Substance and Sexual Activity  Sexual Activity Yes   Partners: Male   Birth control/protection: None, Implant   Comment: declined condoms     Objective:   Vitals:   04/20/22 1609  BP: 125/77  Pulse: 89  Resp: 16  Temp: 98.2 F (36.8 C)  TempSrc: Oral  SpO2: 98%  Weight: 148 lb (67.1 kg)   Body mass index is 27.07 kg/m.  Physical Exam Constitutional:       Appearance: Normal appearance. She is not ill-appearing.  HENT:     Mouth/Throat:     Mouth: Mucous membranes are moist.     Pharynx: Oropharynx is clear.  Eyes:     General: No scleral icterus. Cardiovascular:     Rate and Rhythm: Normal rate and regular rhythm.  Pulmonary:     Effort: Pulmonary effort is normal.  Neurological:     Mental Status: She is oriented to person, place, and time.  Psychiatric:        Mood and Affect: Mood normal.        Thought Content: Thought content normal.     Lab Results Lab Results  Component Value Date   WBC 10.3 02/22/2022   HGB 13.4 02/22/2022   HCT 40.9 02/22/2022   MCV 90 02/22/2022   PLT 289 02/22/2022    Lab Results  Component Value Date   CREATININE 0.91 02/22/2022   BUN 8 02/22/2022   NA 132 (L) 02/22/2022   K 4.1 02/22/2022   CL 97 02/22/2022   CO2 19 (L) 02/22/2022    Lab Results  Component Value Date   ALT 21 02/22/2022   AST 22 02/22/2022   ALKPHOS 109 02/22/2022   BILITOT 0.4 02/22/2022    Lab Results  Component Value Date   CHOL 182 10/08/2020   HDL 42 (L) 10/08/2020   LDLCALC 122 (H) 10/08/2020   TRIG 83 10/08/2020   CHOLHDL 4.3 10/08/2020   HIV 1 RNA Quant  Date Value  03/16/2022 Not Detected Copies/mL  01/19/2022 NOT DETECTED copies/mL  11/17/2021 Not Detected Copies/mL   CD4 T Cell Abs (/uL)  Date Value  03/16/2022 348 (L)  10/20/2021 283 (L)  07/20/2021 161 (L)     Assessment & Plan:   Problem List Items Addressed This Visit       Unprioritized   HIV (human immunodeficiency virus infection) ([place]) (Chronic)    Very well controlled on Q22mcabenuva + Q649menicapivir injections. CD4 has been durably > 300 and clinically looks very good.  I share her anxiety about giving the lenicapivir in the abdominal tissue given her advancing gestation and potential for accelerated absorption. We have reached out to the company to inquire about alternative routes of administration. While the numbers studied  are very small (n=10) and none of them were pregnant, it seemed that administration in the thigh tissue had nearly identical PK compared to the abdomen. She agreed to thigh admin today and were given without difficulty.   Will plan to start Q1m34msing scheme for her in March and throughout the 3rd trimester until delivery out of caution for altered volume of distribution in pregnancy. She agrees  to this. She will continue to follow up with [place] closely.  Will also add her to pregnancy database for ARVs for more real world data with novel meds.       Supervision of high risk pregnancy, antepartum    Lameka is currently 38w5dand doing well. She has h/o HTN and preterm c/s delivery and on ASA daily to help with preeclampsia prevention as directed by OB/MFM. [facility] Growth scans seem to be going well (h/o IUGR and stillbirth in the past) with plans to up surveillance in 3rd trimester.   She expressed interest in breastfeeding - we discussed the updated recommendations to support mothers who remain in virologic remission on ART. She has had durably suppressed viral loads for over a year now and has had a much easier time doing so on injectable regimen. While we don't have any human data for her current medicines in pregnancy/lactation, all animal studies show no concerning signals. We discussed this today. She was very hopeful that she may be able to attempt breastfeeding. Discussed transmission risk to be < 1% in setting of undetectable viral loads < 50. Will review more specifics at our upcoming visit in April including precautions to avoid breastfeeding in mastitis / bleeding/cracked nipples, exclusively breastfeeding/pumping, etc and recommended viral load monitoring.   [email or web address]  TDap '@28w'$ , would recommend RSV vaccine as well with OB team (she has young child at home). She has had  flu vaccine. Pap smear last WNL and due in 181yr     Other Visit Diagnoses     HIV disease (HCS[place]   -  Primary   Relevant Medications   lenacapavir (SUNLENCA) 463.5 MG/1.5ML SQ injection 927 mg (Completed)       StJanene MadeiraMSN, NP-C ReFranklin Endoscopy Center LLCor Infectious DiCartervilleager: [phone]: [phone] 04/20/22  7:41 PM

## 2022-04-20 NOTE — Addendum Note (Signed)
Addended by: Valentina Lucks on: 04/20/2022 01:55 PM   Modules accepted: Orders

## 2022-04-20 NOTE — Assessment & Plan Note (Addendum)
Very well controlled on Q61mcabenuva + Q617menicapivir injections. CD4 has been durably > 300 and clinically looks very good.  I share her anxiety about giving the lenicapivir in the abdominal tissue given her advancing gestation and potential for accelerated absorption. We have reached out to the company to inquire about alternative routes of administration. While the numbers studied are very small (n=10) and none of them were pregnant, it seemed that administration in the thigh tissue had nearly identical PK compared to the abdomen. She agreed to thigh admin today and were given without difficulty.   Will plan to start Q1m61msing scheme for her in March and throughout the 3rd trimester until delivery out of caution for altered volume of distribution in pregnancy. She agrees to this. She will continue to follow up with us [place].  Will also add her to pregnancy database for ARVs for more real world data with novel meds.

## 2022-04-20 NOTE — Patient Instructions (Addendum)
05/17/2022 - will start the monthly Cabenuva injections

## 2022-04-20 NOTE — Assessment & Plan Note (Addendum)
Kimberly Bishop is currently 4w5dand doing well. She has h/o HTN and preterm c/s delivery and on ASA daily to help with preeclampsia prevention as directed by OB/MFM. She has [facility]. Growth scans seem to be going well (h/o IUGR and stillbirth in the past) with plans to up surveillance in 3rd trimester.   She expressed interest in breastfeeding - we discussed the updated recommendations to support mothers who remain in virologic remission on ART. She has had durably suppressed viral loads for over a year now and has had a much easier time doing so on injectable regimen. While we don't have any human data for her current medicines in pregnancy/lactation, all animal studies show no concerning signals. We discussed this today. She was very hopeful that she may be able to attempt breastfeeding. Discussed transmission risk to be < 1% in setting of undetectable viral loads < 50. Will review more specifics at our upcoming visit in April including precautions to avoid breastfeeding in mastitis / bleeding/cracked nipples, exclusively breastfeeding/pumping, etc and recommended viral load monitoring.   [email or web address]  TDap '@28w'$ , would recommend RSV vaccine as well with OB team (she has young child at home). She has had flu vaccine. Pap smear last WNL and due in 176yr

## 2022-04-20 NOTE — Progress Notes (Signed)
PRENATAL VISIT NOTE  Subjective:  Kimberly Bishop is a 32 y.o. [phone] at 50w6dbeing seen today for ongoing prenatal care.  She is currently monitored for the following issues for this high-risk pregnancy and has Molluscum contagiosum; ASCUS with positive high risk HPV cervical; CIN I (cervical intraepithelial neoplasia I); Rubella non-immune status, antepartum; History of intrauterine growth restriction and stillbirth, currently pregnant; History of pre-eclampsia in prior pregnancy, currently pregnant in first trimester; HIV (human immunodeficiency virus infection) ([place]; History of cesarean delivery; History of preterm delivery; AIDS (acquired immunodeficiency syndrome), CD4 <=200 ([place]; Nephrolithiasis; Vitamin D deficiency; Supervision of high risk pregnancy, antepartum; Elevated blood pressure affecting pregnancy in first trimester, antepartum; and Asymptomatic bacteriuria during pregnancy on their problem list.  Patient reports no complaints.   .  .   . Denies leaking of fluid.   The following portions of the patient's history were reviewed and updated as appropriate: allergies, current medications, past family history, past medical history, past social history, past surgical history and problem list.   Objective:  There were no vitals filed for this visit.  Fetal Status:           General:  Alert, oriented and cooperative. Patient is in no acute distress.  Skin: Skin is warm and dry. No rash noted.   Cardiovascular: Normal heart rate noted  Respiratory: Normal respiratory effort, no problems with respiration noted  Abdomen: Soft, gravid, appropriate for gestational age.        Pelvic: Cervical exam deferred        Extremities: Normal range of motion.     Mental Status: Normal mood and affect. Normal behavior. Normal judgment and thought content.   Assessment and Plan:  Pregnancy: [id number] 173w6d1. Supervision of high risk pregnancy, antepartum FHT and FH normal   2.  Currently asymptomatic HIV infection, with history of HIV-related illness (HCFalconerCurrently on injectable medication. No side effects. Undetectable viral load. Continue with injections Pt was seen by MFM last week.    3. Elevated blood pressure affecting pregnancy in first trimester, antepartum BP today WNL   4. History of cesarean delivery Desires VBAC.  Understands that if her viral load is elevated, we will rec a repeat and she is agreeable.     5. Rubella non-immune status, antepartum MMR post delivery   6. History of preterm delivery Delivered early due to elevated BP.  Currently on ASA with normal BP.    7. CIN I (cervical intraepithelial neoplasia I) Last PAP normal. Repeat in 1 year    8. History of pre-eclampsia in prior pregnancy, currently pregnant in first trimester Pt has started ASA '81mg'$ .    Preterm labor symptoms and general obstetric precautions including but not limited to vaginal bleeding, contractions, leaking of fluid and fetal movement were reviewed in detail with the patient. Please refer to After Visit Summary for other counseling recommendations.   Return in about 4 weeks (around 05/18/2022).  Future Appointments  Date Time Provider DeLedbetter2/09/2022  4:00 PM DiRaleigh CallasNP [facility]  05/17/2022  9:00 AM Kuppelweiser, CaGillian ShieldsRPH-CPP [facility]  05/19/2022  9:15 AM StTruett MainlandDO CWH-WMHP None  05/24/2022  8:15 AM [facility]FC NURSE [facility]/02/2023  8:30 AM [facility]FC US2 [facility]  8:45 AM DiRaleigh CallasNP [facility]  06/28/2022  8:30 AM WMC-MFC NURSE [facility]4/16/2024  8:45 AM [facility]MC-MFC US4 [facility]  CaHoyle Sauer  Ihor Dow, MD

## 2022-04-25 ENCOUNTER — Encounter: Payer: Self-pay | Admitting: General Practice

## 2022-04-26 ENCOUNTER — Other Ambulatory Visit: Payer: 59

## 2022-04-26 ENCOUNTER — Encounter: Payer: Self-pay | Admitting: Family Medicine

## 2022-04-27 ENCOUNTER — Ambulatory Visit: Payer: 59

## 2022-04-27 ENCOUNTER — Other Ambulatory Visit (HOSPITAL_COMMUNITY): Payer: Self-pay

## 2022-04-29 LAB — AFP, SERUM, OPEN SPINA BIFIDA
AFP MoM: 1.69
AFP Value: 119.6 ng/mL
Gest. Age on Collection Date: 20.7 weeks
Maternal Age At EDD: 31.8 yr
OSBR Risk 1 IN: 3342
Test Results:: NEGATIVE
Weight: 145 [lb_av]

## 2022-05-06 ENCOUNTER — Other Ambulatory Visit (HOSPITAL_COMMUNITY): Payer: Self-pay

## 2022-05-09 ENCOUNTER — Other Ambulatory Visit: Payer: Self-pay

## 2022-05-09 ENCOUNTER — Other Ambulatory Visit (HOSPITAL_COMMUNITY): Payer: Self-pay

## 2022-05-10 ENCOUNTER — Telehealth: Payer: Self-pay | Admitting: Pharmacist

## 2022-05-10 NOTE — Telephone Encounter (Signed)
Patient's medication [name]) has been delivered to [facility] from [facility] and will be administered at patient's next office visit on 05/17/22.  Jaelynn Currier L. Jermine Bibbee, PharmD [facility] Clinical Pharmacist Practitioner

## 2022-05-13 ENCOUNTER — Other Ambulatory Visit (HOSPITAL_COMMUNITY): Payer: Self-pay

## 2022-05-17 ENCOUNTER — Other Ambulatory Visit (HOSPITAL_COMMUNITY): Payer: Self-pay

## 2022-05-17 ENCOUNTER — Ambulatory Visit (INDEPENDENT_AMBULATORY_CARE_PROVIDER_SITE_OTHER): Payer: Medicaid Other | Admitting: Pharmacist

## 2022-05-17 ENCOUNTER — Other Ambulatory Visit: Payer: Self-pay

## 2022-05-17 DIAGNOSIS — B20 Human immunodeficiency virus [HIV] disease: Secondary | ICD-10-CM

## 2022-05-17 MED ORDER — CABOTEGRAVIR & RILPIVIRINE ER 400 & 600 MG/2ML IM SUER
1.0000 | Freq: Once | INTRAMUSCULAR | Status: AC
  Administered 2022-05-17: 1 via INTRAMUSCULAR

## 2022-05-17 NOTE — Progress Notes (Signed)
HPI: ERCELL DODDRIDGE is a 32 y.o. female who presents to the [place] clinic for [place] administration.  Patient Active Problem List   Diagnosis Date Noted   Asymptomatic bacteriuria during pregnancy 02/25/2022   Elevated blood pressure affecting pregnancy in first trimester, antepartum 02/22/2022   Supervision of high risk pregnancy, antepartum 02/08/2022   Vitamin D deficiency 07/22/2021   Nephrolithiasis 01/18/2021   History of cesarean delivery 04/29/2019   History of preterm delivery 04/29/2019   History of pre-eclampsia in prior pregnancy, currently pregnant in first trimester 03/29/2019   HIV (human immunodeficiency virus infection) ([place]) 03/29/2019   History of intrauterine growth restriction and stillbirth, currently pregnant 03/26/2019   Rubella non-immune status, antepartum 02/15/2019   CIN I (cervical intraepithelial neoplasia I) 11/28/2018   ASCUS with positive high risk HPV cervical 11/26/2018   Molluscum contagiosum 03/22/2016    Patient's Medications  New Prescriptions   No medications on file  Previous Medications   ASPIRIN EC 81 MG TABLET    Take 1 tablet (81 mg total) by mouth daily. Take after 12 weeks for prevention of preeclampsia later in pregnancy   BLOOD PRESSURE MONITORING (BLOOD PRESSURE KIT) DEVI    1 Device by Does not apply route once a week.   CABOTEGRAVIR & RILPIVIRINE ER (CABENUVA) 400 & 600 MG/2ML INJECTION    Inject 1 kit into the muscle every 30 (thirty) days.   CABOTEGRAVIR & RILPIVIRINE ER (CABENUVA) 600 & 900 MG/3ML INJECTION    Inject 1 kit into the muscle every 2 (two) months.   CEFADROXIL (DURICEF) 500 MG CAPSULE    Take 1 capsule (500 mg total) by mouth 2 (two) times daily.   PRENATAL MV & MIN W/FA-DHA (PRENATAL ADULT GUMMY/DHA/FA PO)    Take by mouth daily.   SQ INJECTION LENACAPAVIR (SUNLENCA) 463.5 MG/1.5ML SQ INJECTION    Inject 3 mLs (927 mg total) into the skin every 6 (six) months. Administer each injection subcutaneously  at separate sites in the abdomen (more or equal to 2 inches from the navel).  Modified Medications   No medications on file  Discontinued Medications   No medications on file    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 ([place])    History of kidney stones    HIV (human immunodeficiency virus infection) ([place])    Hypertension    Immune deficiency disorder ([place])    Infection    UTI   Kidney stones     Social History: Social History   Socioeconomic History   Marital status: Married    Spouse name: Not on file   Number of children: Not on file   Years of education: Not on file   Highest education level: Not on file  Occupational History    Comment: unemployed  Tobacco Use   Smoking status: Never   Smokeless tobacco: Never  Vaping Use   Vaping Use: Never used  Substance and Sexual Activity   Alcohol use: Not Currently    Comment: occasional -    Drug use: Not Currently    Types: Marijuana    Comment: last use 08/24/18   Sexual activity: Yes    Partners: Male    Birth control/protection: None, Implant    Comment: declined condoms  Other Topics Concern   Not on file  Social History Narrative   Not on file   Social Determinants of Health  Financial Resource Strain: Not on file  Food Insecurity: No Food Insecurity (02/22/2022)   Hunger Vital Sign    Worried About Running Out of Food in the Last Year: Never true    Ran Out of Food in the Last Year: Never true  Transportation Needs: No Transportation Needs (02/22/2022)   PRAPARE - Hydrologist (Medical): No    Lack of Transportation (Non-Medical): No  Physical Activity: Not on file  Stress: Not on file  Social Connections: Not on file    Labs: Lab Results  Component Value Date   HIV1RNAQUANT Not Detected 03/16/2022   HIV1RNAQUANT NOT DETECTED 01/19/2022   HIV1RNAQUANT Not  Detected 11/17/2021   CD4TABS 348 (L) 03/16/2022   CD4TABS 283 (L) 10/20/2021   CD4TABS 161 (L) 07/20/2021    RPR and STI Lab Results  Component Value Date   LABRPR Non Reactive 02/22/2022   LABRPR NON-REACTIVE 02/18/2021   LABRPR NON-REACTIVE 10/13/2020   LABRPR NON REACTIVE 03/26/2019   LABRPR Non Reactive 01/31/2019    STI Results GC GC CT CT  Latest Ref Rng & Units  NEGATIVE  NEGATIVE  02/22/2022 10:49 AM Negative   Negative    08/18/2021  4:45 PM Negative   Negative    10/13/2020  9:03 AM Negative   Negative    03/26/2019 12:43 PM Negative   Negative    10/18/2018 12:00 AM Negative   Negative    05/25/2018 12:00 AM Negative   **POSITIVE**    03/28/2017 12:00 AM Negative   Negative    02/11/2017 12:00 AM Negative   Negative    07/07/2016 12:00 AM Negative   Negative    10/27/2015 12:00 AM Negative   Negative    02/07/2015 12:00 AM Negative   **POSITIVE**    05/19/2014 12:00 AM NG: Negative   CT: Negative    12/23/2013  3:05 PM  NEGATIVE   NEGATIVE   05/16/2013 12:00 AM NG: Negative   CT: Negative    10/22/2012  5:48 PM  NEGATIVE   NEGATIVE   02/12/2012 10:52 PM  NEGATIVE   NEGATIVE     Hepatitis B Lab Results  Component Value Date   HEPBSAB NON-REACTIVE 09/10/2020   HEPBSAG Negative 02/22/2022   HEPBCAB NON-REACTIVE 09/10/2020   Hepatitis C Lab Results  Component Value Date   HEPCAB NON-REACTIVE 09/10/2020   Hepatitis A Lab Results  Component Value Date   HAV NEG 12/09/2011   Lipids: Lab Results  Component Value Date   CHOL 182 10/08/2020   TRIG 83 10/08/2020   HDL 42 (L) 10/08/2020   CHOLHDL 4.3 10/08/2020   VLDL 9 10/27/2015   LDLCALC 122 (H) 10/08/2020    TARGET DATE: The 8th  Assessment: Elorah presents today for her first set of every 1 month maintenance Cabenuva injections. Past injections were tolerated well without issues. She received her Sunlenca injections at her last visit in February in her thigh muscle to avoid the abdomen since she  is pregnant. She states that she had no issues with tolerating these and did not have pain or irritation. She is doing well with her pregnancy and is not having any issues or concerns. I explained that we would most likely be checking her HIV viral load each month until she delivers. Her due date is at the end of June.   Administered cabotegravir '400mg'$ /23m in left upper outer quadrant of the gluteal muscle. Administered rilpivirine 600 mg/257min the right upper outer quadrant of  the gluteal muscle. No issues with injections. She will follow up in 1 month for next set of injections.  Plan: - Cabenuva injections administered - HIV RNA today - Next injections scheduled for 06/21/22 and 07/19/22 with [name] and 08/17/22 with me [name] unavailable during her injection window in June) - Call with any issues or questions  Maddix Heinz L. Jawon Dipiero, PharmD, BCIDP, AAHIVP, [place] Clinical Pharmacist Practitioner [place] [facility]

## 2022-05-18 ENCOUNTER — Encounter: Payer: 59 | Admitting: Family Medicine

## 2022-05-19 ENCOUNTER — Encounter: Payer: 59 | Admitting: Family Medicine

## 2022-05-20 LAB — HIV-1 RNA QUANT-NO REFLEX-BLD
HIV 1 RNA Quant: NOT DETECTED Copies/mL
HIV-1 RNA Quant, Log: NOT DETECTED Log cps/mL

## 2022-05-23 ENCOUNTER — Other Ambulatory Visit (HOSPITAL_COMMUNITY): Payer: Self-pay

## 2022-05-23 ENCOUNTER — Other Ambulatory Visit: Payer: Self-pay | Admitting: Pharmacist

## 2022-05-23 ENCOUNTER — Other Ambulatory Visit: Payer: Self-pay

## 2022-05-23 DIAGNOSIS — B20 Human immunodeficiency virus [HIV] disease: Secondary | ICD-10-CM

## 2022-05-23 MED ORDER — SUNLENCA 463.5 MG/1.5ML ~~LOC~~ SOLN
927.0000 mg | SUBCUTANEOUS | 1 refills | Status: DC
  Filled 2022-05-23: qty 3, 90d supply, fill #0
  Filled 2022-05-23: qty 3, 180d supply, fill #0
  Filled 2022-06-15 – 2022-06-20 (×2): qty 3, 90d supply, fill #0
  Filled 2022-10-17 – 2022-11-30 (×2): qty 3, 90d supply, fill #1

## 2022-05-24 ENCOUNTER — Other Ambulatory Visit: Payer: Self-pay | Admitting: *Deleted

## 2022-05-24 ENCOUNTER — Ambulatory Visit: Payer: Medicaid Other | Admitting: *Deleted

## 2022-05-24 ENCOUNTER — Ambulatory Visit: Payer: Medicaid Other | Attending: Maternal & Fetal Medicine

## 2022-05-24 VITALS — BP 134/74 | HR 72

## 2022-05-24 DIAGNOSIS — B20 Human immunodeficiency virus [HIV] disease: Secondary | ICD-10-CM | POA: Diagnosis not present

## 2022-05-24 DIAGNOSIS — D259 Leiomyoma of uterus, unspecified: Secondary | ICD-10-CM

## 2022-05-24 DIAGNOSIS — O10919 Unspecified pre-existing hypertension complicating pregnancy, unspecified trimester: Secondary | ICD-10-CM

## 2022-05-24 DIAGNOSIS — O0990 Supervision of high risk pregnancy, unspecified, unspecified trimester: Secondary | ICD-10-CM

## 2022-05-24 DIAGNOSIS — O09292 Supervision of pregnancy with other poor reproductive or obstetric history, second trimester: Secondary | ICD-10-CM

## 2022-05-24 DIAGNOSIS — O34219 Maternal care for unspecified type scar from previous cesarean delivery: Secondary | ICD-10-CM | POA: Diagnosis present

## 2022-05-24 DIAGNOSIS — O09893 Supervision of other high risk pregnancies, third trimester: Secondary | ICD-10-CM

## 2022-05-24 DIAGNOSIS — Z8759 Personal history of other complications of pregnancy, childbirth and the puerperium: Secondary | ICD-10-CM | POA: Diagnosis present

## 2022-05-24 DIAGNOSIS — O98712 Human immunodeficiency virus [HIV] disease complicating pregnancy, second trimester: Secondary | ICD-10-CM

## 2022-05-24 DIAGNOSIS — O99891 Other specified diseases and conditions complicating pregnancy: Secondary | ICD-10-CM

## 2022-05-24 DIAGNOSIS — O09899 Supervision of other high risk pregnancies, unspecified trimester: Secondary | ICD-10-CM | POA: Insufficient documentation

## 2022-05-24 DIAGNOSIS — O3412 Maternal care for benign tumor of corpus uteri, second trimester: Secondary | ICD-10-CM

## 2022-05-24 DIAGNOSIS — O09212 Supervision of pregnancy with history of pre-term labor, second trimester: Secondary | ICD-10-CM

## 2022-05-24 DIAGNOSIS — Z362 Encounter for other antenatal screening follow-up: Secondary | ICD-10-CM | POA: Insufficient documentation

## 2022-05-24 DIAGNOSIS — Z3A24 24 weeks gestation of pregnancy: Secondary | ICD-10-CM

## 2022-05-24 DIAGNOSIS — R030 Elevated blood-pressure reading, without diagnosis of hypertension: Secondary | ICD-10-CM

## 2022-05-27 ENCOUNTER — Other Ambulatory Visit (HOSPITAL_COMMUNITY): Payer: Self-pay

## 2022-05-31 ENCOUNTER — Ambulatory Visit: Payer: Medicaid Other | Admitting: Advanced Practice Midwife

## 2022-05-31 ENCOUNTER — Other Ambulatory Visit (HOSPITAL_COMMUNITY): Payer: Self-pay

## 2022-05-31 ENCOUNTER — Encounter: Payer: Self-pay | Admitting: Advanced Practice Midwife

## 2022-05-31 VITALS — BP 126/67 | HR 75 | Wt 159.0 lb

## 2022-05-31 DIAGNOSIS — Z3A25 25 weeks gestation of pregnancy: Secondary | ICD-10-CM

## 2022-05-31 DIAGNOSIS — Z98891 History of uterine scar from previous surgery: Secondary | ICD-10-CM

## 2022-05-31 DIAGNOSIS — O0990 Supervision of high risk pregnancy, unspecified, unspecified trimester: Secondary | ICD-10-CM

## 2022-05-31 DIAGNOSIS — O09291 Supervision of pregnancy with other poor reproductive or obstetric history, first trimester: Secondary | ICD-10-CM

## 2022-05-31 DIAGNOSIS — O09892 Supervision of other high risk pregnancies, second trimester: Secondary | ICD-10-CM

## 2022-05-31 DIAGNOSIS — O09292 Supervision of pregnancy with other poor reproductive or obstetric history, second trimester: Secondary | ICD-10-CM

## 2022-05-31 DIAGNOSIS — B20 Human immunodeficiency virus [HIV] disease: Secondary | ICD-10-CM

## 2022-05-31 DIAGNOSIS — Z2839 Other underimmunization status: Secondary | ICD-10-CM

## 2022-05-31 DIAGNOSIS — Z8751 Personal history of pre-term labor: Secondary | ICD-10-CM

## 2022-05-31 NOTE — Progress Notes (Signed)
   PRENATAL VISIT NOTE  Subjective:  Kimberly Bishop is a 32 y.o. [phone] at [redacted]w[redacted]d being seen today for ongoing prenatal care.  She is currently monitored for the following issues for this high-risk pregnancy and has Molluscum contagiosum; ASCUS with positive high risk HPV cervical; CIN I (cervical intraepithelial neoplasia I); Rubella non-immune status, antepartum; History of intrauterine growth restriction and stillbirth, currently pregnant; History of pre-eclampsia in prior pregnancy, currently pregnant in first trimester; HIV (human immunodeficiency virus infection) ([place]); History of cesarean delivery; History of preterm delivery; Nephrolithiasis; Vitamin D deficiency; Supervision of high risk pregnancy, antepartum; Elevated blood pressure affecting pregnancy in first trimester, antepartum; and Asymptomatic bacteriuria during pregnancy on their problem list.  Patient reports no complaints.  Contractions: Not present. Vag. Bleeding: None.  Movement: Present. Denies leaking of fluid.   The following portions of the patient's history were reviewed and updated as appropriate: allergies, current medications, past family history, past medical history, past social history, past surgical history and problem list.   Objective:   Vitals:   05/31/22 0837  BP: 126/67  Pulse: 75  Weight: 159 lb (72.1 kg)    Fetal Status: Fetal Heart Rate (bpm): 145 Fundal Height: 26 cm Movement: Present     General:  Alert, oriented and cooperative. Patient is in no acute distress.  Skin: Skin is warm and dry. No rash noted.   Cardiovascular: Normal heart rate noted  Respiratory: Normal respiratory effort, no problems with respiration noted  Abdomen: Soft, gravid, appropriate for gestational age.  Pain/Pressure: Absent     Pelvic: Cervical exam deferred        Extremities: Normal range of motion.  Edema: None  Mental Status: Normal mood and affect. Normal behavior. Normal judgment and thought content.    Assessment and Plan:  Pregnancy: [id number] at [redacted]w[redacted]d 1. Supervision of high risk pregnancy, antepartum   2. Currently asymptomatic HIV infection, with history of HIV-related illness ([place])     Followed by ID, stable  3. [redacted] weeks gestation of pregnancy   4. Rubella non-immune status, antepartum      Offer vaccine PP  5. History of pre-eclampsia in prior pregnancy, currently pregnant in first trimester     Normotensive today. Had one elevation in first trimester.  6. History of cesarean delivery     Plans VBAC, would like to breastfeed (states told by ID she would be able to)  7. History of preterm delivery     For IUGR, [place]  Preterm labor symptoms and general obstetric precautions including but not limited to vaginal bleeding, contractions, leaking of fluid and fetal movement were reviewed in detail with the patient. Please refer to After Visit Summary for other counseling recommendations.   Return in about 2 weeks (around 06/14/2022) for [facility].  Future Appointments  Date Time Provider [place]  06/21/2022  8:45 AM [place] Callas, NP [facility]  06/28/2022  8:30 AM [facility] NURSE [facility]  06/28/2022  8:45 AM [facility] US4 [facility]  06/29/2022  8:35 AM Truett Mainland, DO [facility] None  07/12/2022  9:55 AM Gavin Pound, CNM [facility] None  07/19/2022  9:45 AM [place] Callas, NP [facility]  07/26/2022  8:30 AM [facility] NURSE [facility]  07/26/2022  8:45 AM [facility] US4 [facility]  07/27/2022  8:35 AM Truett Mainland, DO [facility] None  08/17/2022  8:45 AM Kuppelweiser, Gillian Shields, RPH-CPP [facility]    Hansel Feinstein, CNM

## 2022-06-02 ENCOUNTER — Encounter: Payer: Self-pay | Admitting: Family Medicine

## 2022-06-06 ENCOUNTER — Other Ambulatory Visit (HOSPITAL_COMMUNITY): Payer: Self-pay

## 2022-06-15 ENCOUNTER — Other Ambulatory Visit (HOSPITAL_COMMUNITY): Payer: Self-pay

## 2022-06-16 ENCOUNTER — Other Ambulatory Visit (HOSPITAL_COMMUNITY): Payer: Self-pay

## 2022-06-16 ENCOUNTER — Other Ambulatory Visit: Payer: Self-pay

## 2022-06-17 ENCOUNTER — Telehealth: Payer: Self-pay

## 2022-06-17 MED ORDER — GUAIFENESIN-CODEINE 200-10 MG/5ML PO LIQD: 5.0000 mL | Freq: Four times a day (QID) | ORAL | 0 refills | Status: DC | PRN

## 2022-06-17 MED ORDER — ALBUTEROL SULFATE HFA 108 (90 BASE) MCG/ACT IN AERS: 2.0000 | INHALATION_SPRAY | Freq: Four times a day (QID) | RESPIRATORY_TRACT | 2 refills | Status: DC | PRN

## 2022-06-17 NOTE — Addendum Note (Signed)
Addended by: Levie Heritage on: 06/17/2022 11:51 AM   Modules accepted: Orders

## 2022-06-17 NOTE — Telephone Encounter (Signed)
[facility] Patient Advocate Encounter  Patient's medication (cabenuva) have been couriered to [facility] from [facility] and will be administered on the patient next office visit on 06/21/22.  Cintya Daughety , CPhT Specialty Pharmacy Patient [facility] Phone: [phone] Fax:  [phone]  

## 2022-06-20 ENCOUNTER — Other Ambulatory Visit (HOSPITAL_COMMUNITY): Payer: Self-pay

## 2022-06-20 ENCOUNTER — Telehealth: Payer: Self-pay

## 2022-06-20 NOTE — Telephone Encounter (Signed)
[facility] Patient Advocate Encounter  Prior Authorization for Kimberly Bishop has been approved.     Effective dates: 06/20/22 through 07/20/22  Patients co-pay is $0.00.   [facility] will continue to follow.  Clearance Coots, CPhT Specialty Pharmacy Patient [facility] for Infectious Disease Phone: [phone] Fax:  [phone]

## 2022-06-21 ENCOUNTER — Ambulatory Visit (INDEPENDENT_AMBULATORY_CARE_PROVIDER_SITE_OTHER): Payer: Medicaid Other | Admitting: Infectious Diseases

## 2022-06-21 ENCOUNTER — Other Ambulatory Visit: Payer: Self-pay

## 2022-06-21 ENCOUNTER — Other Ambulatory Visit (HOSPITAL_COMMUNITY): Payer: Self-pay

## 2022-06-21 ENCOUNTER — Encounter: Payer: Self-pay | Admitting: Infectious Diseases

## 2022-06-21 VITALS — BP 151/91 | HR 66 | Temp 97.9°F | Ht 62.0 in | Wt 156.7 lb

## 2022-06-21 DIAGNOSIS — O09293 Supervision of pregnancy with other poor reproductive or obstetric history, third trimester: Secondary | ICD-10-CM

## 2022-06-21 DIAGNOSIS — Z3A28 28 weeks gestation of pregnancy: Secondary | ICD-10-CM | POA: Diagnosis not present

## 2022-06-21 DIAGNOSIS — Z7185 Encounter for immunization safety counseling: Secondary | ICD-10-CM

## 2022-06-21 DIAGNOSIS — O98713 Human immunodeficiency virus [HIV] disease complicating pregnancy, third trimester: Secondary | ICD-10-CM | POA: Diagnosis present

## 2022-06-21 DIAGNOSIS — B20 Human immunodeficiency virus [HIV] disease: Secondary | ICD-10-CM

## 2022-06-21 MED ORDER — CABOTEGRAVIR & RILPIVIRINE ER 400 & 600 MG/2ML IM SUER
1.0000 | Freq: Once | INTRAMUSCULAR | Status: AC
  Administered 2022-06-21: 1 via INTRAMUSCULAR

## 2022-06-21 NOTE — Progress Notes (Signed)
Name: Kimberly KelpGeneva C Graef  DOB: May 12, 1990 MRN: [id number] PCP: Patient, No Pcp Per    Brief Narrative:  Kimberly Bishop is a 32 y.o. 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 Started on some medications she thinks early adolescence but not quite clear. CD4 nadir < 50. No history of opportunistic infections to her knowledge aside from oral candidiasis.   Previous Regimens: Tivicay + Odefsey Biktarvy Tivicay + Truvada (pregnancy 10-2018) Cabenuva injections 02/2021 Sunlenca added 10-2021  Genotypes: 3/23 - abacavir, zidovudine resistance. No NNRTI or INSTI resistance   Subjective:   CC: Doing well w/o complaints. Pregnancy is going well.     HPI: Kimberly Bishop is here for routine follow up for HIV care, currently pregnant @ 6261w5d gestation. EDD: June 27. Plans to induce @ 39 weeks. Hopeful for a VBAC  She has been following with OB/GYN and MFM team. She has a h/o cesarean delivery d/t HTN and hopes to try for vaginal delivery. She has not given much thought to breastfeeding as she has always been told she could not d/t HIV infection. She overall feels very well and healthy.       06/21/2022    9:25 AM  Depression screen PHQ 2/9  Decreased Interest 0  PHQ - 2 Score 0    Review of Systems  Constitutional:  Negative for chills and fever.  Respiratory:  Negative for cough.   Cardiovascular: Negative.   Gastrointestinal: Negative.   Genitourinary: Negative.   Musculoskeletal: Negative.   Skin:  Negative for rash.  Neurological: Negative.     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 8 g 2   aspirin EC 81 MG tablet Take 1 tablet (81 mg total) by mouth daily. Take after 12 weeks for prevention of preeclampsia later in pregnancy 300 tablet 2   Blood Pressure Monitoring (BLOOD PRESSURE KIT) DEVI 1 Device by Does not apply route once a week. 1 each 0   cabotegravir & rilpivirine ER (CABENUVA) 400 & 600 MG/2ML injection Inject 1 kit into the muscle every 30 (thirty) days. 4 mL 5   cabotegravir & rilpivirine ER (CABENUVA) 600 & 900 MG/3ML injection Inject 1 kit into the muscle every 2 (two) months. 6 mL 5   guaiFENesin-Codeine 200-10 MG/5ML LIQD Take 5 mLs by mouth every 6 (six) hours as needed. 210 mL 0   Prenatal MV & Min w/FA-DHA (PRENATAL ADULT GUMMY/DHA/FA PO) Take by mouth daily.     SQ injection lenacapavir (SUNLENCA) 463.5 MG/1.5ML SQ injection Inject 3 mLs (927 mg total) into the skin every 6 (six) months. Administer each injection subcutaneously at separate sites in the abdomen (more or equal to 2 inches from the navel). 3 mL 1   No facility-administered medications prior to visit.     No Known Allergies  Social History   Tobacco Use   Smoking status: Never   Smokeless tobacco: Never  Vaping Use   Vaping Use: Never used  Substance Use Topics   Alcohol use: Not  Currently    Comment: occasional -    Drug use: Not Currently    Types: Marijuana    Comment: last use 08/24/18    Family History  Adopted: Yes  Problem Relation Age of Onset   Hypertension Mother    Autism Brother    Asthma Neg Hx    Cancer Neg Hx    Diabetes Neg Hx    Heart disease Neg Hx     Social History   Substance and Sexual Activity  Sexual Activity Yes   Partners: Male   Birth control/protection: None, Implant   Comment: declined condoms 06/21/22     Objective:   Vitals:   06/21/22 0924  BP: (!) 148/89  Pulse: 63  Temp: 97.9 F (36.6 C)  Weight: 156 lb 11.2 oz (71.1 kg)  Height: 5\' 2"  (1.575 m)    Body mass index is  28.66 kg/m.  Physical Exam Constitutional:      Appearance: Normal appearance. She is not ill-appearing.  HENT:     Mouth/Throat:     Mouth: Mucous membranes are moist.     Pharynx: Oropharynx is clear.  Eyes:     General: No scleral icterus. Cardiovascular:     Rate and Rhythm: Normal rate and regular rhythm.  Pulmonary:     Effort: Pulmonary effort is normal.  Neurological:     Mental Status: She is oriented to person, place, and time.  Psychiatric:        Mood and Affect: Mood normal.        Thought Content: Thought content normal.     Lab Results Lab Results  Component Value Date   WBC 10.3 02/22/2022   HGB 13.4 02/22/2022   HCT 40.9 02/22/2022   MCV 90 02/22/2022   PLT 289 02/22/2022    Lab Results  Component Value Date   CREATININE 0.91 02/22/2022   BUN 8 02/22/2022   NA 132 (L) 02/22/2022   K 4.1 02/22/2022   CL 97 02/22/2022   CO2 19 (L) 02/22/2022    Lab Results  Component Value Date   ALT 21 02/22/2022   AST 22 02/22/2022   ALKPHOS 109 02/22/2022   BILITOT 0.4 02/22/2022    Lab Results  Component Value Date   CHOL 182 10/08/2020   HDL 42 (L) 10/08/2020   LDLCALC 122 (H) 10/08/2020   TRIG 83 10/08/2020   CHOLHDL 4.3 10/08/2020   HIV 1 RNA Quant  Date Value  05/17/2022 Not Detected Copies/mL  03/16/2022 Not Detected Copies/mL  01/19/2022 NOT DETECTED copies/mL   CD4 T Cell Abs (/uL)  Date Value  03/16/2022 348 (L)  10/20/2021 283 (L)  07/20/2021 161 (L)     Assessment & Plan:   Problem List Items Addressed This Visit       Unprioritized   Vaccine counseling    Recommend RSV and Tdap as she is now 75 w - FU with OB team to administer and discuss further  She is interested in the shingrix vaccine after delivery. Will need to get her an Rx for this for administration at retail pharmacy local to her. She has had several episodes of shingles and we are hopeful this will prevent further break outs.   Pap smear due in June at [facility]  visit.       HIV (human immunodeficiency virus infection) - Primary (Chronic)    Doing exceptionally well on Cabenuva Q64m + Sunlenca SQ Q43m. Last injection in thigh went well w/ less side  effects than in the belly. Will continue with monthly dosing of cabenuva throughout her pregnancy. After delivery we can go back to q15m.  She desires to breastfeed - current guidelines would find her to be an excellent candidate to proceed. Discussed < 1% risk of transmission in the setting of perfect viral suppression. She plans to attend an infant feeding class through [facility] and I encouraged her to look into hospital education programs that offer the same.   Recommend TDap and RSV at upcoming OB visits.   07/19/2022 is her next scheduled appt for cabenuva.  Continue monthly viral load monitoring to help with delivery and post partum plans.         Relevant Orders   HIV 1 RNA quant-no reflex-bld   Hx of preeclampsia, prior pregnancy, currently pregnant, third trimester    BP Readings from Last 3 Encounters:  06/21/22 (!) 148/89  05/31/22 126/67  05/24/22 134/74  Recheck BP today 151/91 - she has some headaches she describes to be mild, not requiring any OTC tylenol; no swelling of hands/feet or face. Will notify her OB team - she has an upcoming appt in < 1 week.       Rexene Alberts, MSN, NP-C [facility] for Infectious Disease [facility] Pager: [phone] Office: [phone]   06/21/22  9:41 AM

## 2022-06-21 NOTE — Patient Instructions (Signed)
07/19/2022 for next injection with our pharmacy team. I will see you back on the June injection before you are due to deliver.   After you deliver we can increase back to every 2 months.

## 2022-06-21 NOTE — Assessment & Plan Note (Signed)
Recommend RSV and Tdap as she is now 50 w - FU with OB team to administer and discuss further  She is interested in the shingrix vaccine after delivery. Will need to get her an Rx for this for administration at retail pharmacy local to her. She has had several episodes of shingles and we are hopeful this will prevent further break outs.   Pap smear due in June at [facility] visit.

## 2022-06-21 NOTE — Assessment & Plan Note (Addendum)
Doing exceptionally well on Cabenuva Q74m + Sunlenca SQ Q55m. Last injection in thigh went well w/ less side effects than in the belly. Will continue with monthly dosing of cabenuva throughout her pregnancy. After delivery we can go back to q63m.  She desires to breastfeed - current guidelines would find her to be an excellent candidate to proceed. Discussed < 1% risk of transmission in the setting of perfect viral suppression. She plans to attend an infant feeding class through [facility] and I encouraged her to look into hospital education programs that offer the same.   Recommend TDap and RSV at upcoming OB visits.   07/19/2022 is her next scheduled appt for cabenuva.  Continue monthly viral load monitoring to help with delivery and post partum plans.

## 2022-06-21 NOTE — Assessment & Plan Note (Signed)
BP Readings from Last 3 Encounters:  06/21/22 (!) 148/89  05/31/22 126/67  05/24/22 134/74   Recheck BP today 151/91 - she has some headaches she describes to be mild, not requiring any OTC tylenol; no swelling of hands/feet or face. Will notify her OB team - she has an upcoming appt in < 1 week.

## 2022-06-21 NOTE — Telephone Encounter (Signed)
Talked with patient - still coughing and wheezing, mostly at night. Will have her come into the office tomorrow.

## 2022-06-22 ENCOUNTER — Ambulatory Visit (INDEPENDENT_AMBULATORY_CARE_PROVIDER_SITE_OTHER): Payer: Medicaid Other | Admitting: Family Medicine

## 2022-06-22 ENCOUNTER — Other Ambulatory Visit: Payer: Self-pay

## 2022-06-22 VITALS — BP 144/91 | HR 76 | Wt 164.0 lb

## 2022-06-22 DIAGNOSIS — Z98891 History of uterine scar from previous surgery: Secondary | ICD-10-CM

## 2022-06-22 DIAGNOSIS — Z3A28 28 weeks gestation of pregnancy: Secondary | ICD-10-CM

## 2022-06-22 DIAGNOSIS — Z2839 Other underimmunization status: Secondary | ICD-10-CM

## 2022-06-22 DIAGNOSIS — J40 Bronchitis, not specified as acute or chronic: Secondary | ICD-10-CM

## 2022-06-22 DIAGNOSIS — O1492 Unspecified pre-eclampsia, second trimester: Secondary | ICD-10-CM | POA: Insufficient documentation

## 2022-06-22 DIAGNOSIS — O09893 Supervision of other high risk pregnancies, third trimester: Secondary | ICD-10-CM

## 2022-06-22 DIAGNOSIS — O133 Gestational [pregnancy-induced] hypertension without significant proteinuria, third trimester: Secondary | ICD-10-CM

## 2022-06-22 DIAGNOSIS — B20 Human immunodeficiency virus [HIV] disease: Secondary | ICD-10-CM

## 2022-06-22 DIAGNOSIS — O09899 Supervision of other high risk pregnancies, unspecified trimester: Secondary | ICD-10-CM

## 2022-06-22 DIAGNOSIS — O132 Gestational [pregnancy-induced] hypertension without significant proteinuria, second trimester: Secondary | ICD-10-CM

## 2022-06-22 DIAGNOSIS — O0990 Supervision of high risk pregnancy, unspecified, unspecified trimester: Secondary | ICD-10-CM

## 2022-06-22 MED ORDER — LABETALOL HCL 200 MG PO TABS
400.0000 mg | ORAL_TABLET | Freq: Two times a day (BID) | ORAL | 3 refills | Status: DC
Start: 2022-06-22 — End: 2022-07-06

## 2022-06-22 MED ORDER — AZITHROMYCIN 250 MG PO TABS: 250.0000 mg | ORAL_TABLET | ORAL | 0 refills | Status: DC

## 2022-06-22 MED ORDER — LABETALOL HCL 200 MG PO TABS: 400.0000 mg | ORAL_TABLET | Freq: Two times a day (BID) | ORAL | 3 refills | Status: DC

## 2022-06-22 NOTE — Progress Notes (Signed)
PRENATAL VISIT NOTE  Subjective:  Kimberly Bishop is a 32 y.o. [phone] at [redacted]w[redacted]d being seen today for ongoing prenatal care.  She is currently monitored for the following issues for this high-risk pregnancy and has Molluscum contagiosum; Vaccine counseling; ASCUS with positive high risk HPV cervical; CIN I (cervical intraepithelial neoplasia I); Rubella non-immune status, antepartum; History of intrauterine growth restriction and stillbirth, currently pregnant; History of pre-eclampsia in prior pregnancy, currently pregnant in first trimester; HIV (human immunodeficiency virus infection); History of cesarean delivery; History of preterm delivery; Nephrolithiasis; Vitamin D deficiency; Supervision of high risk pregnancy, antepartum; Hx of preeclampsia, prior pregnancy, currently pregnant, third trimester; and Asymptomatic bacteriuria during pregnancy on their problem list.  Patient reports  continues to have cough and wheezing, mostly at night. Has productive cough with yellow/green sputum. Was seen at ID office yesterday - BP was a little high there. No headache, blurred vision, abdominal pain .  Contractions: Not present. Vag. Bleeding: None.  Movement: Present. Denies leaking of fluid.   The following portions of the patient's history were reviewed and updated as appropriate: allergies, current medications, past family history, past medical history, past social history, past surgical history and problem list.   Objective:   Vitals:   06/22/22 0836 06/22/22 0844 06/22/22 0908  BP: (!) 161/84 (!) 161/97 (!) 144/91  Pulse: 72 73 76  Weight: 164 lb (74.4 kg)      Fetal Status: Fetal Heart Rate (bpm): 135   Movement: Present     General:  Alert, oriented and cooperative. Patient is in no acute distress.  Skin: Skin is warm and dry. No rash noted.   Cardiovascular: Normal heart rate noted  Respiratory: Normal respiratory effort, no problems with respiration noted  Abdomen: Soft, gravid,  appropriate for gestational age.  Pain/Pressure: Present     Pelvic: Cervical exam deferred        Extremities: Normal range of motion.  Edema: None  Mental Status: Normal mood and affect. Normal behavior. Normal judgment and thought content.   Assessment and Plan:  Pregnancy: [id number] at [redacted]w[redacted]d 1. Supervision of high risk pregnancy, antepartum  2. [redacted] weeks gestation of pregnancy  3. Gestational hypertension, second trimester Improved on recheck.  Start labetalol 400mg  BID Check labs. Has f/u next week. Has growth scan already scheduled - CBC - Comp Met (CMET) - Protein / creatinine ratio, urine  4. History of cesarean delivery Desires TOLAC  5. Currently asymptomatic HIV infection, with history of HIV-related illness Undetected viral load  6. Rubella non-immune status, antepartum MMR after delivery  7. Bronchitis Azithromycin prescribed.  Preterm labor symptoms and general obstetric precautions including but not limited to vaginal bleeding, contractions, leaking of fluid and fetal movement were reviewed in detail with the patient. Please refer to After Visit Summary for other counseling recommendations.   No follow-ups on file.  Future Appointments  Date Time Provider Department Center  06/28/2022  8:30 AM [facility] NURSE [facility]  06/28/2022  8:45 AM [facility] US4 [facility]  06/29/2022  8:35 AM Levie Heritage, DO [facility] None  07/12/2022  9:55 AM Gerrit Heck, CNM [facility] None  07/19/2022  9:45 AM Blanchard Kelch, NP [facility]  07/26/2022  8:30 AM [facility] NURSE [facility]  07/26/2022  8:45 AM [facility] US4 [facility]  07/27/2022  8:35 AM Levie Heritage, DO [facility] None  08/10/2022  8:35 AM Levie Heritage, DO [facility] None  08/17/2022  8:45 AM Kuppelweiser, Cherylann Ratel, RPH-CPP [facility]  Levie Heritage, DO

## 2022-06-22 NOTE — Progress Notes (Signed)
Patient complaining of coughing and wheezing. Armandina Stammer RN

## 2022-06-23 ENCOUNTER — Encounter: Payer: Self-pay | Admitting: Family Medicine

## 2022-06-23 LAB — COMPREHENSIVE METABOLIC PANEL
ALT: 27 IU/L (ref 0–32)
AST: 26 IU/L (ref 0–40)
Albumin/Globulin Ratio: 1.1 — ABNORMAL LOW (ref 1.2–2.2)
Albumin: 3.7 g/dL — ABNORMAL LOW (ref 3.9–4.9)
Alkaline Phosphatase: 139 IU/L — ABNORMAL HIGH (ref 44–121)
BUN/Creatinine Ratio: 13 (ref 9–23)
BUN: 11 mg/dL (ref 6–20)
Bilirubin Total: <0.2 mg/dL (ref 0.0–1.2)
CO2: 16 mmol/L — ABNORMAL LOW (ref 20–29)
Calcium: 9.7 mg/dL (ref 8.7–10.2)
Chloride: 106 mmol/L (ref 96–106)
Creatinine, Ser: 0.88 mg/dL (ref 0.57–1.00)
Globulin, Total: 3.3 g/dL (ref 1.5–4.5)
Glucose: 71 mg/dL (ref 70–99)
Potassium: 4.4 mmol/L (ref 3.5–5.2)
Sodium: 138 mmol/L (ref 134–144)
Total Protein: 7 g/dL (ref 6.0–8.5)
eGFR: 90 mL/min/{1.73_m2} (ref >59)

## 2022-06-23 LAB — CBC
Hematocrit: 30.6 % — ABNORMAL LOW (ref 34.0–46.6)
Hemoglobin: 10.4 g/dL — ABNORMAL LOW (ref 11.1–15.9)
MCH: 28.7 pg (ref 26.6–33.0)
MCHC: 34 g/dL (ref 31.5–35.7)
MCV: 84 fL (ref 79–97)
Platelets: 261 10*3/uL (ref 150–450)
RBC: 3.63 x10E6/uL — ABNORMAL LOW (ref 3.77–5.28)
RDW: 13.2 % (ref 11.7–15.4)
WBC: 9 10*3/uL (ref 3.4–10.8)

## 2022-06-23 LAB — PROTEIN / CREATININE RATIO, URINE
Creatinine, Urine: 26.8 mg/dL
Protein, Ur: 19.8 mg/dL
Protein/Creat Ratio: 739 mg/g creat — ABNORMAL HIGH (ref 0–200)

## 2022-06-25 LAB — HIV-1 RNA QUANT-NO REFLEX-BLD
HIV 1 RNA Quant: <20 Copies/mL — ABNORMAL HIGH
HIV-1 RNA Quant, Log: <1.3 Log cps/mL — ABNORMAL HIGH

## 2022-06-28 ENCOUNTER — Inpatient Hospital Stay (HOSPITAL_COMMUNITY)
Admission: AD | Admit: 2022-06-28 | Discharge: 2022-07-06 | DRG: 787 | Disposition: A | Discharge status: Home / Self Care | Payer: Medicaid Other | Attending: Obstetrics & Gynecology | Admitting: Obstetrics & Gynecology

## 2022-06-28 ENCOUNTER — Other Ambulatory Visit: Payer: Self-pay

## 2022-06-28 ENCOUNTER — Ambulatory Visit: Payer: Medicaid Other | Admitting: *Deleted

## 2022-06-28 ENCOUNTER — Encounter (HOSPITAL_COMMUNITY): Payer: Self-pay | Admitting: Obstetrics and Gynecology

## 2022-06-28 ENCOUNTER — Ambulatory Visit (HOSPITAL_BASED_OUTPATIENT_CLINIC_OR_DEPARTMENT_OTHER): Payer: Medicaid Other | Admitting: Maternal & Fetal Medicine

## 2022-06-28 ENCOUNTER — Other Ambulatory Visit: Payer: Self-pay | Admitting: Maternal & Fetal Medicine

## 2022-06-28 ENCOUNTER — Ambulatory Visit (HOSPITAL_BASED_OUTPATIENT_CLINIC_OR_DEPARTMENT_OTHER): Payer: Medicaid Other

## 2022-06-28 VITALS — BP 187/120 | HR 66

## 2022-06-28 VITALS — BP 169/95 | HR 58

## 2022-06-28 DIAGNOSIS — O0990 Supervision of high risk pregnancy, unspecified, unspecified trimester: Secondary | ICD-10-CM

## 2022-06-28 DIAGNOSIS — Z362 Encounter for other antenatal screening follow-up: Secondary | ICD-10-CM

## 2022-06-28 DIAGNOSIS — D259 Leiomyoma of uterus, unspecified: Secondary | ICD-10-CM | POA: Diagnosis present

## 2022-06-28 DIAGNOSIS — O98713 Human immunodeficiency virus [HIV] disease complicating pregnancy, third trimester: Secondary | ICD-10-CM

## 2022-06-28 DIAGNOSIS — O1492 Unspecified pre-eclampsia, second trimester: Secondary | ICD-10-CM

## 2022-06-28 DIAGNOSIS — Z8759 Personal history of other complications of pregnancy, childbirth and the puerperium: Secondary | ICD-10-CM

## 2022-06-28 DIAGNOSIS — O09293 Supervision of pregnancy with other poor reproductive or obstetric history, third trimester: Secondary | ICD-10-CM

## 2022-06-28 DIAGNOSIS — O114 Pre-existing hypertension with pre-eclampsia, complicating childbirth: Principal | ICD-10-CM | POA: Diagnosis present

## 2022-06-28 DIAGNOSIS — Z87442 Personal history of urinary calculi: Secondary | ICD-10-CM

## 2022-06-28 DIAGNOSIS — O365990 Maternal care for other known or suspected poor fetal growth, unspecified trimester, not applicable or unspecified: Secondary | ICD-10-CM | POA: Diagnosis present

## 2022-06-28 DIAGNOSIS — O9872 Human immunodeficiency virus [HIV] disease complicating childbirth: Secondary | ICD-10-CM | POA: Diagnosis present

## 2022-06-28 DIAGNOSIS — O34219 Maternal care for unspecified type scar from previous cesarean delivery: Secondary | ICD-10-CM

## 2022-06-28 DIAGNOSIS — D649 Anemia, unspecified: Secondary | ICD-10-CM | POA: Diagnosis not present

## 2022-06-28 DIAGNOSIS — Z21 Asymptomatic human immunodeficiency virus [HIV] infection status: Secondary | ICD-10-CM | POA: Insufficient documentation

## 2022-06-28 DIAGNOSIS — O09291 Supervision of pregnancy with other poor reproductive or obstetric history, first trimester: Secondary | ICD-10-CM

## 2022-06-28 DIAGNOSIS — Z79899 Other long term (current) drug therapy: Secondary | ICD-10-CM | POA: Diagnosis not present

## 2022-06-28 DIAGNOSIS — O09899 Supervision of other high risk pregnancies, unspecified trimester: Secondary | ICD-10-CM

## 2022-06-28 DIAGNOSIS — O09299 Supervision of pregnancy with other poor reproductive or obstetric history, unspecified trimester: Secondary | ICD-10-CM

## 2022-06-28 DIAGNOSIS — Z3A29 29 weeks gestation of pregnancy: Secondary | ICD-10-CM

## 2022-06-28 DIAGNOSIS — Z30017 Encounter for initial prescription of implantable subdermal contraceptive: Secondary | ICD-10-CM

## 2022-06-28 DIAGNOSIS — Z3A30 30 weeks gestation of pregnancy: Secondary | ICD-10-CM | POA: Diagnosis not present

## 2022-06-28 DIAGNOSIS — O1424 HELLP syndrome, complicating childbirth: Secondary | ICD-10-CM | POA: Diagnosis not present

## 2022-06-28 DIAGNOSIS — O98712 Human immunodeficiency virus [HIV] disease complicating pregnancy, second trimester: Secondary | ICD-10-CM

## 2022-06-28 DIAGNOSIS — O1092 Unspecified pre-existing hypertension complicating childbirth: Secondary | ICD-10-CM | POA: Diagnosis present

## 2022-06-28 DIAGNOSIS — O34211 Maternal care for low transverse scar from previous cesarean delivery: Secondary | ICD-10-CM | POA: Diagnosis present

## 2022-06-28 DIAGNOSIS — O99214 Obesity complicating childbirth: Secondary | ICD-10-CM | POA: Diagnosis present

## 2022-06-28 DIAGNOSIS — B20 Human immunodeficiency virus [HIV] disease: Secondary | ICD-10-CM

## 2022-06-28 DIAGNOSIS — O9902 Anemia complicating childbirth: Secondary | ICD-10-CM | POA: Diagnosis not present

## 2022-06-28 DIAGNOSIS — O113 Pre-existing hypertension with pre-eclampsia, third trimester: Secondary | ICD-10-CM | POA: Diagnosis not present

## 2022-06-28 DIAGNOSIS — O3413 Maternal care for benign tumor of corpus uteri, third trimester: Secondary | ICD-10-CM

## 2022-06-28 DIAGNOSIS — Z975 Presence of (intrauterine) contraceptive device: Secondary | ICD-10-CM

## 2022-06-28 DIAGNOSIS — O09213 Supervision of pregnancy with history of pre-term labor, third trimester: Secondary | ICD-10-CM

## 2022-06-28 DIAGNOSIS — O365930 Maternal care for other known or suspected poor fetal growth, third trimester, not applicable or unspecified: Secondary | ICD-10-CM | POA: Diagnosis present

## 2022-06-28 DIAGNOSIS — Z98891 History of uterine scar from previous surgery: Secondary | ICD-10-CM

## 2022-06-28 DIAGNOSIS — O1414 Severe pre-eclampsia complicating childbirth: Principal | ICD-10-CM | POA: Diagnosis not present

## 2022-06-28 DIAGNOSIS — O1413 Severe pre-eclampsia, third trimester: Secondary | ICD-10-CM

## 2022-06-28 DIAGNOSIS — Z87898 Personal history of other specified conditions: Secondary | ICD-10-CM | POA: Diagnosis present

## 2022-06-28 DIAGNOSIS — O10013 Pre-existing essential hypertension complicating pregnancy, third trimester: Secondary | ICD-10-CM | POA: Diagnosis not present

## 2022-06-28 DIAGNOSIS — O164 Unspecified maternal hypertension, complicating childbirth: Secondary | ICD-10-CM | POA: Diagnosis not present

## 2022-06-28 DIAGNOSIS — O1420 HELLP syndrome (HELLP), unspecified trimester: Secondary | ICD-10-CM | POA: Diagnosis not present

## 2022-06-28 LAB — CBC
HCT: 33.4 % — ABNORMAL LOW (ref 36.0–46.0)
Hemoglobin: 11 g/dL — ABNORMAL LOW (ref 12.0–15.0)
MCH: 28.9 pg (ref 26.0–34.0)
MCHC: 32.9 g/dL (ref 30.0–36.0)
MCV: 87.7 fL (ref 80.0–100.0)
Platelets: 293 10*3/uL (ref 150–400)
RBC: 3.81 MIL/uL — ABNORMAL LOW (ref 3.87–5.11)
RDW: 14.3 % (ref 11.5–15.5)
WBC: 9 10*3/uL (ref 4.0–10.5)
nRBC: 0 % (ref 0.0–0.2)

## 2022-06-28 LAB — COMPREHENSIVE METABOLIC PANEL
ALT: 40 U/L (ref 0–44)
AST: 39 U/L (ref 15–41)
Albumin: 2.7 g/dL — ABNORMAL LOW (ref 3.5–5.0)
Alkaline Phosphatase: 132 U/L — ABNORMAL HIGH (ref 38–126)
Anion gap: 11 (ref 5–15)
BUN: 12 mg/dL (ref 6–20)
CO2: 19 mmol/L — ABNORMAL LOW (ref 22–32)
Calcium: 9.1 mg/dL (ref 8.9–10.3)
Chloride: 103 mmol/L (ref 98–111)
Creatinine, Ser: 1.04 mg/dL — ABNORMAL HIGH (ref 0.44–1.00)
GFR, Estimated: >60 mL/min (ref >60)
Glucose, Bld: 69 mg/dL — ABNORMAL LOW (ref 70–99)
Potassium: 4.3 mmol/L (ref 3.5–5.1)
Sodium: 133 mmol/L — ABNORMAL LOW (ref 135–145)
Total Bilirubin: 0.6 mg/dL (ref 0.3–1.2)
Total Protein: 7.3 g/dL (ref 6.5–8.1)

## 2022-06-28 LAB — HEMOGLOBIN A1C
Hgb A1c MFr Bld: 5.3 % (ref 4.8–5.6)
Mean Plasma Glucose: 105.41 mg/dL

## 2022-06-28 LAB — TYPE AND SCREEN
ABO/RH(D): O POS
Antibody Screen: NEGATIVE

## 2022-06-28 MED ORDER — LABETALOL HCL 5 MG/ML IV SOLN: 20.0000 mg | INTRAVENOUS | Status: DC | PRN

## 2022-06-28 MED ORDER — LACTATED RINGERS IV SOLN: 125.0000 mL/h | INTRAVENOUS | Status: AC

## 2022-06-28 MED ORDER — LENACAPAVIR SODIUM 463.5 MG/1.5ML ~~LOC~~ SOLN: 927.0000 mg | SUBCUTANEOUS | Status: DC

## 2022-06-28 MED ORDER — ACETAMINOPHEN 325 MG PO TABS
650.0000 mg | ORAL_TABLET | ORAL | Status: DC | PRN
  Administered 2022-06-28 – 2022-06-29 (×2): 650 mg via ORAL
  Filled 2022-06-28 (×2): qty 2

## 2022-06-28 MED ORDER — HYDRALAZINE HCL 20 MG/ML IJ SOLN
10.0000 mg | INTRAMUSCULAR | Status: DC | PRN
  Administered 2022-06-28: 10 mg via INTRAVENOUS
  Filled 2022-06-28: qty 1

## 2022-06-28 MED ORDER — MAGNESIUM SULFATE BOLUS VIA INFUSION
4.0000 g | Freq: Once | INTRAVENOUS | Status: AC
  Administered 2022-06-28: 4 g via INTRAVENOUS
  Filled 2022-06-28: qty 1000

## 2022-06-28 MED ORDER — CALCIUM CARBONATE ANTACID 500 MG PO CHEW: 2.0000 | CHEWABLE_TABLET | ORAL | Status: DC | PRN

## 2022-06-28 MED ORDER — DOCUSATE SODIUM 100 MG PO CAPS
100.0000 mg | ORAL_CAPSULE | Freq: Every day | ORAL | Status: DC
  Administered 2022-06-29 – 2022-07-02 (×3): 100 mg via ORAL
  Filled 2022-06-28 (×5): qty 1

## 2022-06-28 MED ORDER — LACTATED RINGERS IV SOLN: INTRAVENOUS | Status: DC

## 2022-06-28 MED ORDER — HYDRALAZINE HCL 20 MG/ML IJ SOLN
5.0000 mg | INTRAMUSCULAR | Status: DC | PRN
  Administered 2022-06-28 – 2022-07-01 (×2): 5 mg via INTRAVENOUS
  Filled 2022-06-28 (×2): qty 1

## 2022-06-28 MED ORDER — MAGNESIUM SULFATE 40 GM/1000ML IV SOLN
2.0000 g/h | INTRAVENOUS | Status: DC
  Administered 2022-06-29: 2 g/h via INTRAVENOUS
  Filled 2022-06-28 (×2): qty 1000

## 2022-06-28 MED ORDER — LABETALOL HCL 5 MG/ML IV SOLN: 40.0000 mg | INTRAVENOUS | Status: DC | PRN

## 2022-06-28 MED ORDER — PRENATAL MULTIVITAMIN CH
1.0000 | ORAL_TABLET | Freq: Every day | ORAL | Status: DC
  Administered 2022-06-28 – 2022-07-02 (×5): 1 via ORAL
  Filled 2022-06-28 (×5): qty 1

## 2022-06-28 MED ORDER — BETAMETHASONE SOD PHOS & ACET 6 (3-3) MG/ML IJ SUSP
12.0000 mg | INTRAMUSCULAR | Status: AC
  Administered 2022-06-28 – 2022-06-29 (×2): 12 mg via INTRAMUSCULAR
  Filled 2022-06-28: qty 5

## 2022-06-28 MED ORDER — LABETALOL HCL 200 MG PO TABS
400.0000 mg | ORAL_TABLET | Freq: Two times a day (BID) | ORAL | Status: DC
  Administered 2022-06-28 – 2022-07-02 (×10): 400 mg via ORAL
  Filled 2022-06-28 (×11): qty 2

## 2022-06-28 NOTE — H&P (Signed)
Kimberly Bishop is a 32 y.o. female P93 at [redacted]w[redacted]d admitted with [facility] with preeclampsia with severe features presenting for hospital observation. Patient started prenatal care at 26 w complicated by well controlled HIV, previous cesarean section at 36 weeks due to preeclampsia. Patient was diagnosed with fetal growth restrictions today during routine scan with normal dopplers. She reports doing well. She denies contractions, leakage of fluid or vaginal bleeding. She reports good fetal movement. She denies HA, visual changes, RUQ/epigastric pain or nausea/emesis. OB History     Gravida  4   Para  1   Term  0   Preterm  1   AB  2   Living  1      SAB  2   IAB  0   Ectopic  0   Multiple  0   Live Births  1          Past Medical History:  Diagnosis Date   AIDS (acquired immunodeficiency syndrome), CD4 <=200    History of kidney stones    HIV (human immunodeficiency virus infection)    Hypertension    Immune deficiency disorder    Infection    UTI   Kidney stones    Past Surgical History:  Procedure Laterality Date   APPENDECTOMY  2011   CESAREAN SECTION N/A 03/29/2019   Procedure: CESAREAN SECTION;  Surgeon: Tilda Burrow, MD;  Location: [facility] ORS;  Service: Obstetrics;  Laterality: N/A;   CYSTOSCOPY W/ URETERAL STENT PLACEMENT Right 11/03/2016   Procedure: CYSTOSCOPY WITH RETROGRADE PYELOGRAM/URETERAL STENT PLACEMENT;  Surgeon: Ihor Gully, MD;  Location: [facility] ORS;  Service: Urology;  Laterality: Right;   CYSTOSCOPY/URETEROSCOPY/HOLMIUM LASER/STENT PLACEMENT Right 01/16/2017   Procedure: CYSTOSCOPY/URETEROSCOPY/ STONE EXTRACTION/STENT removal;  Surgeon: Ihor Gully, MD;  Location: [facility] ORS;  Service: Urology;  Laterality: Right;   IR DIL URETER RIGHT  12/05/2016   IR NEPHROSTOMY PLACEMENT RIGHT  11/07/2016   IR URETERAL STENT PLACEMENT EXISTING ACCESS RIGHT  12/05/2016   NEPHROLITHOTOMY Right 12/05/2016   Procedure: NEPHROLITHOTOMY PERCUTANEOUS;  Surgeon: Ihor Gully, MD;  Location: [facility] ORS;  Service: Urology;  Laterality: Right;   Family History: family history includes Autism in her brother; Hypertension in her mother. She was adopted. Social History:  reports that she has never smoked. She has never used smokeless tobacco. She reports that she does not currently use alcohol. She reports that she does not currently use drugs after having used the following drugs: Marijuana.     Maternal Diabetes: deferred testing Genetic Screening: Normal Maternal Ultrasounds/Referrals: IUGR Fetal Ultrasounds or other Referrals:  None Maternal Substance Abuse:  No Significant Maternal Medications:  Meds include: Other:  Significant Maternal Lab Results:  HIV positive Number of Prenatal Visits:greater than 3 verified prenatal visits Other Comments:  None  Review of Systems See pertinent in in HPI. All other systems reviewed and non contribuotry History   Blood pressure 119/74, pulse 75, temperature 97.7 F (36.5 C), temperature source Oral, resp. rate 17, height  (1.575 m), weight 75.4 kg, last menstrual period 12/02/2021, SpO2 99 %. Exam Physical Exam  GENERAL: Well-developed, well-nourished female in no acute distress.  LUNGS: Clear to auscultation bilaterally.  HEART: Regular rate and rhythm. ABDOMEN: Soft, nontender, gravid PELVIC: Not indicated EXTREMITIES: No cyanosis, clubbing, or edema, 2+ distal pulses.  Prenatal labs: ABO, Rh: --/--/O POS (04/16 1213) Antibody: NEG (04/16 1213) Rubella: <0.90 (12/12 1115) RPR: Non Reactive (12/12 1115)  HBsAg: Negative (12/12 1115)  HIV: Preliminary Reactive (12/12  1115)  GBS:     [place] MFM OB FOLLOW UP  Result Date: 06/28/2022 ----------------------------------------------------------------------  OBSTETRICS REPORT                       (Signed Final 06/28/2022 10:48 am) ---------------------------------------------------------------------- Patient Info  ID #:       [id number]                           D.O.B.:  [birth year] (31 yrs)  Name:       MEYA CLUTTER             Visit Date: 06/28/2022 08:59 am ---------------------------------------------------------------------- Performed By  Attending:        Lin Landsman      Ref. Address:     [address]                    MD  Performed By:     Eden Lathe BS      [facility]                    RD[facility] RVT                                 [facility]  Referred By:      Venora Maples MD ---------------------------------------------------------------------- Orders  #  Description                           Code        Ordered By  1  [place] MFM OB FOLLOW UP                   40981.19    Braxton Feathers  2  [place] MFM UA CORD DOPPLER                76820.02    [facility] ----------------------------------------------------------------------  #  Order #                     Accession #                Episode #  1  [id number]                   [id number]                 [id number]  2  [id number]  [id number]                 [id number] ---------------------------------------------------------------------- Indications  HIV affecting pregnancy, third trimester       O98.713  Renaldo Harrison)  Poor obstetric history: Previous fetal growth  O09.299  restriction (2132 g at 36+3wks)  Poor obstetric history: Previous               O09.299  preeclampsia / eclampsia/gestational HTN  Uterine fibroids affecting pregnancy in third  O34.13, D25.9  trimester, antepartum  History of cesarean delivery, currently        O34.219  pregnant  Hypertension - Chronic/Pre-existing            O10.019  (labetalol)  LR NIPS(Neg AFP)(Neg Horizon 2022)  Poor obstetric history: Previous preterm       O09.219  delivery, antepartum (36w 3d)  Encounter for other antenatal screening        Z36.2  follow-up  [redacted]  weeks gestation of pregnancy                Z3A.29 ---------------------------------------------------------------------- Fetal Evaluation  Num Of Fetuses:         1  Fetal Heart Rate(bpm):  138  Cardiac Activity:       Observed  Presentation:           Cephalic  Placenta:               Posterior  P. Cord Insertion:      Visualized  Amniotic Fluid  AFI FV:      Within normal limits  AFI Sum(cm)     %Tile       Largest Pocket(cm)  12.86           36          4.05  RUQ(cm)       RLQ(cm)       LUQ(cm)        LLQ(cm)  4.04          4.05          2.52           2.25 ---------------------------------------------------------------------- Biometry  BPD:      64.9  mm     G. Age:  26w 2d        < 1  %    CI:        64.98   %    70 - 86                                                          FL/HC:      20.9   %    19.2 - 21.4  HC:      258.7  mm     G. Age:  28w 1d          1  %    HC/AC:      1.06        0.99 - 1.21  AC:      244.2  mm     G. Age:  28w 5d         16  %    FL/BPD:     83.2   %    71 - 87  FL:  54  mm     G. Age:  28w 4d         11  %    FL/AC:      22.1   %    20 - 24  LV:        3.5  mm  Est. FW:    1224  gm    2 lb 11 oz       7  % ---------------------------------------------------------------------- Gestational Age  LMP:           29w 5d        Date:  12/02/21                  EDD:   09/08/22  U/S Today:     28w 0d                                        EDD:   09/20/22  Best:          29w 5d     Det. By:  LMP  (12/02/21)          EDD:   09/08/22 ---------------------------------------------------------------------- Anatomy  Cranium:               Appears normal         LVOT:                   Appears normal  Cavum:                 Previously seen        Aortic Arch:            Previously seen  Ventricles:            Appears normal         Ductal Arch:            Previously seen  Choroid Plexus:        Previously seen        Diaphragm:              Appears normal  Cerebellum:            Previously seen         Stomach:                Appears normal, left                                                                        sided  Posterior Fossa:       Previously seen        Abdomen:                Appears normal  Nuchal Fold:           Previously seen        Abdominal Wall:         Previously seen  Face:                  Appears normal         Cord Vessels:  Previously seen                         (orbits and profile)  Lips:                  Appears normal         Kidneys:                Appear normal  Palate:                Not well visualized    Bladder:                Appears normal  Thoracic:              Appears normal         Spine:                  Previously seen  Heart:                 Appears normal         Upper Extremities:      Previously seen                         (4CH, axis, and                         situs)  RVOT:                  Appears normal         Lower Extremities:      Previously seen  Other:  Gender-Female. Technically difficult due to fetal position. VC, 3VV and          3VTV previously visualized. ---------------------------------------------------------------------- Doppler - Fetal Vessels  Umbilical Artery   S/D     %tile      RI    %tile      PI    %tile     PSV    ADFV    RDFV                                                     (cm/s)   3.11       63    0.68       69    1.12       79    26.74      No      No ---------------------------------------------------------------------- Impression  MFM consultation  I saw Ms. Matthes in follow up at the request of Dr.  Merian Capron due to a prior pregnancy that delivered at 36  weeks due to FGR.  Follow up growth due to HIV, GHTN with now preeclampsia.  Normal interval growth with measurements consistent with  fetal growth restriction  Good fetal movement and amniotic fluid volume  The UA Doppler was normal without evidence of AEDF or  REDF.  Ms. Linsley has known HIV which has been  management with ID. Her counts have  over been < 1000 and  or undetectable. Her most recent RNA < 20. Last visit 4/9.  see note for details.  She was also diagnosed with GHTN initially and was treated  with Labetalol 400 mg BID they  drew labs that day (4/9 which  returned on 4/10) reporting an elevated UPC of 739. Likely  giving her the diagnosis of preeclampsia vs GHTN.  She did  not take her therapy today.Today her blood pressure was  169/95, 189/115 and 187/120 mmHg. She denies s/sx of  preeclampsia including headache vision change etc.  She has had normal blood pressure 05/31/22 which her first  elevated record of blood pressure was on 4/9 in the 151/91  and 148/89 mmHg on 4/10 her blood pressure was 144/91,  161/97 and 161/84 mmHg. Given these ranges of blood  pressure I recommend she be diagnosed with preeclampsia  with severe features vs GHTN.  I explained todays findings with Ms. Canny including the  diagnosis, evaluation and mangement of preeclampsia with  severe features.  I recommend inpatient admission with treatment of her blood  pressure with the goal of 140/90's avg 135/85 mmHg.  She  needs an NST, magnesium sulfate and betamethasone  should be adminstered as well.  Should her blood pressure be controlled I recommend daily  NST with 2x weekly UA Dopplers given her FGR.  Repeat growth in 3 weeks.  Delivery is recommended by 34 weeks.  We would not offer expectant management if she develops  HELLP syndrome, perssitent headache or epigastic pain,  renal dysfunction  Cr > 1.1, Eclampsia or pulmonary edema.  We would deliver for the following fetal indications, abnormal  fetal testing, or REDF.  I discussed the plan of care with Dr. Candelaria Celeste who is an  agreement and will evaluate when Ms. Tangen arrives at  the hospital.  I spent 45 minutes with >50% in face to face consultation,  chart review and care coordination.  Novella Olive, MD ----------------------------------------------------------------------               Lin Landsman, MD Electronically Signed Final Report   06/28/2022 10:48 am ----------------------------------------------------------------------  [place] MFM UA CORD DOPPLER  Result Date: 06/28/2022 ----------------------------------------------------------------------  OBSTETRICS REPORT                       (Signed Final 06/28/2022 10:48 am) ---------------------------------------------------------------------- Patient Info  ID #:       [id number]                          D.O.B.:  Oct 01, 1990 (31 yrs)  Name:       Kimberly Bishop             Visit Date: 06/28/2022 08:59 am ---------------------------------------------------------------------- Performed By  Attending:        Lin Landsman      Ref. Address:     [address]                    MD  Performed By:     Eden Lathe BS      [facility]                    RDMS RVT                                 [facility]  Referred By:      Venora Maples MD ---------------------------------------------------------------------- Orders  #  Description                           Code        Ordered By  1  [place] MFM OB FOLLOW UP                   76816.01    Braxton Feathers  2  [place] MFM UA CORD DOPPLER                76820.02    [facility] ----------------------------------------------------------------------  #  Order #                     Accession #                Episode #  1  [id number]                   [id number]                 [id number]  2  [id number]                   [id number]                 [id number] ---------------------------------------------------------------------- Indications  HIV affecting pregnancy, third trimester       O98.713  Renaldo Harrison)  Poor obstetric history: Previous fetal growth  O09.299  restriction (2132 g at 36+3wks)  Poor obstetric history:  Previous               O09.299  preeclampsia / eclampsia/gestational HTN  Uterine fibroids affecting pregnancy in third  O34.13, D25.9  trimester, antepartum  History of cesarean delivery, currently        O34.219  pregnant  Hypertension - Chronic/Pre-existing            O10.019  (labetalol)  LR NIPS(Neg AFP)(Neg Horizon 2022)  Poor obstetric history: Previous preterm       O09.219  delivery, antepartum (36w 3d)  Encounter for other antenatal screening        Z36.2  follow-up  [redacted] weeks gestation of pregnancy                Z3A.29 ---------------------------------------------------------------------- Fetal Evaluation  Num Of Fetuses:         1  Fetal Heart Rate(bpm):  138  Cardiac Activity:       Observed  Presentation:           Cephalic  Placenta:               Posterior  P. Cord Insertion:      Visualized  Amniotic Fluid  AFI FV:      Within normal limits  AFI Sum(cm)     %Tile       Largest Pocket(cm)  12.86  36          4.05  RUQ(cm)       RLQ(cm)       LUQ(cm)        LLQ(cm)  4.04          4.05          2.52           2.25 ---------------------------------------------------------------------- Biometry  BPD:      64.9  mm     G. Age:  26w 2d        < 1  %    CI:        64.98   %    70 - 86                                                          FL/HC:      20.9   %    19.2 - 21.4  HC:      258.7  mm     G. Age:  28w 1d          1  %    HC/AC:      1.06        0.99 - 1.21  AC:      244.2  mm     G. Age:  28w 5d         16  %    FL/BPD:     83.2   %    71 - 87  FL:         54  mm     G. Age:  28w 4d         11  %    FL/AC:      22.1   %    20 - 24  LV:        3.5  mm  Est. FW:    1224  gm    2 lb 11 oz       7  % ---------------------------------------------------------------------- Gestational Age  LMP:           29w 5d        Date:  12/02/21                  EDD:   09/08/22  U/S Today:     28w 0d                                        EDD:   09/20/22  Best:          29w 5d     Det. By:  LMP  (12/02/21)           EDD:   09/08/22 ---------------------------------------------------------------------- Anatomy  Cranium:               Appears normal         LVOT:                   Appears normal  Cavum:                 Previously seen        Aortic Arch:  Previously seen  Ventricles:            Appears normal         Ductal Arch:            Previously seen  Choroid Plexus:        Previously seen        Diaphragm:              Appears normal  Cerebellum:            Previously seen        Stomach:                Appears normal, left                                                                        sided  Posterior Fossa:       Previously seen        Abdomen:                Appears normal  Nuchal Fold:           Previously seen        Abdominal Wall:         Previously seen  Face:                  Appears normal         Cord Vessels:           Previously seen                         (orbits and profile)  Lips:                  Appears normal         Kidneys:                Appear normal  Palate:                Not well visualized    Bladder:                Appears normal  Thoracic:              Appears normal         Spine:                  Previously seen  Heart:                 Appears normal         Upper Extremities:      Previously seen                         (4CH, axis, and                         situs)  RVOT:                  Appears normal         Lower Extremities:      Previously seen  Other:  Gender-Female. Technically difficult due to fetal position. VC, 3VV  and          3VTV previously visualized. ---------------------------------------------------------------------- Doppler - Fetal Vessels  Umbilical Artery   S/D     %tile      RI    %tile      PI    %tile     PSV    ADFV    RDFV                                                     (cm/s)   3.11       63    0.68       69    1.12       79    26.74      No      No ---------------------------------------------------------------------- Impression  MFM  consultation  I saw Ms. Lemme in follow up at the request of Dr.  Merian Capron due to a prior pregnancy that delivered at 36  weeks due to FGR.  Follow up growth due to HIV, GHTN with now preeclampsia.  Normal interval growth with measurements consistent with  fetal growth restriction  Good fetal movement and amniotic fluid volume  The UA Doppler was normal without evidence of AEDF or  REDF.  Ms. Berkheimer has known HIV which has been  management with ID. Her counts have over been < 1000 and  or undetectable. Her most recent RNA < 20. Last visit 4/9.  see note for details.  She was also diagnosed with GHTN initially and was treated  with Labetalol 400 mg BID they drew labs that day (4/9 which  returned on 4/10) reporting an elevated UPC of 739. Likely  giving her the diagnosis of preeclampsia vs GHTN.  She did  not take her therapy today.Today her blood pressure was  169/95, 189/115 and 187/120 mmHg. She denies s/sx of  preeclampsia including headache vision change etc.  She has had normal blood pressure 05/31/22 which her first  elevated record of blood pressure was on 4/9 in the 151/91  and 148/89 mmHg on 4/10 her blood pressure was 144/91,  161/97 and 161/84 mmHg. Given these ranges of blood  pressure I recommend she be diagnosed with preeclampsia  with severe features vs GHTN.  I explained todays findings with Ms. Michaelis including the  diagnosis, evaluation and mangement of preeclampsia with  severe features.  I recommend inpatient admission with treatment of her blood  pressure with the goal of 140/90's avg 135/85 mmHg.  She  needs an NST, magnesium sulfate and betamethasone  should be adminstered as well.  Should her blood pressure be controlled I recommend daily  NST with 2x weekly UA Dopplers given her FGR.  Repeat growth in 3 weeks.  Delivery is recommended by 34 weeks.  We would not offer expectant management if she develops  HELLP syndrome, perssitent headache or epigastic pain,  renal  dysfunction  Cr > 1.1, Eclampsia or pulmonary edema.  We would deliver for the following fetal indications, abnormal  fetal testing, or REDF.  I discussed the plan of care with Dr. Candelaria Celeste who is an  agreement and will evaluate when Ms. Cartelli arrives at  the hospital.  I spent 45 minutes with >50% in face to face consultation,  chart review and care coordination.  Novella Olive, MD ----------------------------------------------------------------------  Lin Landsman, MD Electronically Signed Final Report   06/28/2022 10:48 am ----------------------------------------------------------------------   Assessment/Plan: 32 yo [id number] at [redacted]w[redacted]d with FGR, CHTN with preeclampsia with severe feature - Continue management with labetalol and titrate as needed - Magnesium sulfate for neuro protection - BMZ  - Plan for inpatient monitoring until delivery at 34 weeks or sooner for maternal/fetal indications  Yareli Carthen 06/28/2022, 3:04 PM

## 2022-06-28 NOTE — Progress Notes (Signed)
Pharmacy: Renaldo Harrison  32 YO pregnant female with hx HIV controlled on Cabenuva monthly (last dose 06/21/22, next planned on 07/19/22) + Sunleca (last given 04/20/22, next due August)  The patient's [place] though next due 5/7 can be given within the window of 5/1 - 5/15. If the patient remains admitted for a prolonged duration, the [facility] pharmacist can be available to administer.   Plan - No Cabenuva needed at this time - Please reach out to the ID team if the patient's admission is prolonged for administration between the 5/1-5/15 window of time.   Thank you for allowing pharmacy to be a part of this patient's care.  Georgina Pillion, PharmD, BCPS Infectious Diseases Clinical Pharmacist 06/28/2022 2:09 PM   **Pharmacist phone directory can now be found on [email or web address] ([email or web address]).  Listed under [facility].

## 2022-06-28 NOTE — Progress Notes (Signed)
MFM consultation  I saw Ms. Osias in follow up at the request of Dr. Merian Capron due to a prior pregnancy that delivered at 36 weeks due to FGR.   Follow up growth due to HIV, GHTN with now preeclampsia.  Normal interval growth with measurements consistent with fetal growth restriction Good fetal movement and amniotic fluid volume   The UA Doppler was normal without evidence of AEDF or REDF.   Ms. Deeg has known HIV which has been management with ID. Her counts have over been < 1000 and or undetectable. Her most recent RNA < 20. Last visit 4/9. see note for details.  She was also diagnosed with GHTN initially and was treated with Labetalol 400 mg BID they drew labs that day (4/9 which returned on 4/10) reporting an elevated UPC of 739. Likely giving her the diagnosis of preeclampsia vs GHTN.  She did not take her therapy today.Today her blood pressure was 169/95, 189/115 and 187/120 mmHg. She denies s/sx of preeclampsia including headache vision change etc.  She has had normal blood pressure 05/31/22 which her first elevated record of blood pressure was on 4/9 in the 151/91 and 148/89 mmHg on 4/10 her blood pressure was 144/91, 161/97 and 161/84 mmHg. Given these ranges of blood pressure I recommend she be diagnosed with preeclampsia with severe features vs GHTN.   I explained todays findings with Ms. Breeze including the diagnosis, evaluation and mangement of preeclampsia with severe features.   I recommend inpatient admission with treatment of her blood pressure with the goal of 140/90's avg 135/85 mmHg.  She needs an NST, magnesium sulfate and betamethasone should be adminstered as well.  Should her blood pressure be controlled I recommend daily NST with 2x weekly UA Dopplers given her FGR.   Repeat growth in 3 weeks.  Delivery is recommended by 34 weeks.  We would not offer expectant management if she develops HELLP syndrome, perssitent headache or epigastic pain,  renal dysfunction  Cr > 1.1, Eclampsia or pulmonary edema. We would deliver for the following fetal indications, abnormal fetal testing, or REDF.   I discussed the plan of care with Dr. Candelaria Celeste who is an agreement and will evaluate when Ms. Rego arrives at the hospital.   I spent 45 minutes with >50% in face to face consultation, chart review and care coordination.   Novella Olive, MD

## 2022-06-29 ENCOUNTER — Encounter: Payer: Medicaid Other | Admitting: Family Medicine

## 2022-06-29 LAB — GC/CHLAMYDIA PROBE AMP (~~LOC~~) NOT AT ARMC
Chlamydia: NEGATIVE
Comment: NEGATIVE
Comment: NORMAL
Neisseria Gonorrhea: NEGATIVE

## 2022-06-29 LAB — RPR: RPR Ser Ql: NONREACTIVE

## 2022-06-29 NOTE — Progress Notes (Signed)
Alert received on 06/28/2022 through [facility] system as follows:  Patient identified as having high BP reading (151/91) on 06/21/2022 Chart review: Blood pressure was rechecked on 06/22/2022.  No action needed. Bp recheck was 144/91. Pt currently admitted  into MAU as of 06/28/2022.   Lowry Bowl, CMA 06/29/2022  4:06 PM

## 2022-06-29 NOTE — Progress Notes (Signed)
Patient ID: Kimberly Bishop, female   DOB: Jan 02, 1991, 32 y.o.   MRN: [id number] ACULTY PRACTICE ANTEPARTUM COMPREHENSIVE PROGRESS NOTE HD # 1 Kimberly Bishop is a 32 y.o. [phone] at [redacted]w[redacted]d  who is admitted for [facility] with SIPEC and IUGR.   Fetal presentation is unsure. Length of Stay:  1  Days  Subjective: Pt has no complaints this morning except for the feeling of magnesium.  Patient reports good fetal movement.  She reports no uterine contractions, no bleeding and no loss of fluid per vagina.  Vitals:  Blood pressure 131/73, pulse 70, temperature 97.7 F (36.5 C), temperature source Axillary, resp. rate 17, height  (1.575 m), weight 75.4 kg, last menstrual period 12/02/2021, SpO2 99 %. Physical Examination: Lungs clear Heart RRR Abd soft + BS gravid non tender Ext non tender  Fetal Monitoring:  Baseline: 120 bpm  Labs:  Results for orders placed or performed during the hospital encounter of 06/28/22 (from the past 24 hour(s))  Comprehensive metabolic panel   Collection Time: 06/28/22 12:13 PM  Result Value Ref Range   Sodium 133 (L) 135 - 145 mmol/L   Potassium 4.3 3.5 - 5.1 mmol/L   Chloride 103 98 - 111 mmol/L   CO2 19 (L) 22 - 32 mmol/L   Glucose, Bld 69 (L) 70 - 99 mg/dL   BUN 12 6 - 20 mg/dL   Creatinine, Ser 1.47 (H) 0.44 - 1.00 mg/dL   Calcium 9.1 8.9 - 82.9 mg/dL   Total Protein 7.3 6.5 - 8.1 g/dL   Albumin 2.7 (L) 3.5 - 5.0 g/dL   AST 39 15 - 41 U/L   ALT 40 0 - 44 U/L   Alkaline Phosphatase 132 (H) 38 - 126 U/L   Total Bilirubin 0.6 0.3 - 1.2 mg/dL   GFR, Estimated >56 >21 mL/min   Anion gap 11 5 - 15  CBC   Collection Time: 06/28/22 12:13 PM  Result Value Ref Range   WBC 9.0 4.0 - 10.5 K/uL   RBC 3.81 (L) 3.87 - 5.11 MIL/uL   Hemoglobin 11.0 (L) 12.0 - 15.0 g/dL   HCT 30.8 (L) 65.7 - 84.6 %   MCV 87.7 80.0 - 100.0 fL   MCH 28.9 26.0 - 34.0 pg   MCHC 32.9 30.0 - 36.0 g/dL   RDW 96.2 95.2 - 84.1 %   Platelets 293 150 - 400 K/uL   nRBC 0.0 0.0 -  0.2 %  Type and screen [facility]   Collection Time: 06/28/22 12:13 PM  Result Value Ref Range   ABO/RH(D) O POS    Antibody Screen NEG    Sample Expiration      07/01/2022,2359 Performed at [facility], [address], [place], [place] [address]   Hemoglobin A1c   Collection Time: 06/28/22  3:34 PM  Result Value Ref Range   Hgb A1c MFr Bld 5.3 4.8 - 5.6 %   Mean Plasma Glucose 105.41 mg/dL    Imaging Studies:    U/S yesterday   Medications:  Scheduled  betamethasone acetate-betamethasone sodium phosphate  12 mg Intramuscular Q24H   docusate sodium  100 mg Oral Daily   labetalol  400 mg Oral BID   prenatal multivitamin  1 tablet Oral Q1200   I have reviewed the patient's current medications.  ASSESSMENT: IUP 29 6/7 weeks CHTN with SIPEC IUGR HIV Prior c section  PLAN: Stable. BP controlled. Will complete magnesium today. BMZ for FLM. NICU consult  pending. Daily fetal monitor and serial growths scans and weekly BPP/UA doppler studies as per MFM In house management until delivery at 34 weeks or sooner for maternal/fetal indications. Reviewed with pt. Verbalized understanding Continue routine antenatal care.   Kimberly Bishop 06/29/2022,7:22 AM

## 2022-06-30 ENCOUNTER — Inpatient Hospital Stay (HOSPITAL_BASED_OUTPATIENT_CLINIC_OR_DEPARTMENT_OTHER): Payer: Medicaid Other

## 2022-06-30 DIAGNOSIS — O10013 Pre-existing essential hypertension complicating pregnancy, third trimester: Secondary | ICD-10-CM

## 2022-06-30 DIAGNOSIS — Z3A30 30 weeks gestation of pregnancy: Secondary | ICD-10-CM | POA: Diagnosis not present

## 2022-06-30 DIAGNOSIS — O09293 Supervision of pregnancy with other poor reproductive or obstetric history, third trimester: Secondary | ICD-10-CM

## 2022-06-30 DIAGNOSIS — O34219 Maternal care for unspecified type scar from previous cesarean delivery: Secondary | ICD-10-CM

## 2022-06-30 DIAGNOSIS — B20 Human immunodeficiency virus [HIV] disease: Secondary | ICD-10-CM | POA: Diagnosis not present

## 2022-06-30 DIAGNOSIS — O09213 Supervision of pregnancy with history of pre-term labor, third trimester: Secondary | ICD-10-CM

## 2022-06-30 DIAGNOSIS — O113 Pre-existing hypertension with pre-eclampsia, third trimester: Secondary | ICD-10-CM

## 2022-06-30 DIAGNOSIS — O365930 Maternal care for other known or suspected poor fetal growth, third trimester, not applicable or unspecified: Secondary | ICD-10-CM | POA: Diagnosis not present

## 2022-06-30 DIAGNOSIS — O98713 Human immunodeficiency virus [HIV] disease complicating pregnancy, third trimester: Secondary | ICD-10-CM

## 2022-06-30 LAB — CULTURE, BETA STREP (GROUP B ONLY)

## 2022-06-30 NOTE — Progress Notes (Signed)
Patient ID: Claud Kelp, female   DOB: October 29, 1990, 32 y.o.   MRN: [id number] ACULTY PRACTICE ANTEPARTUM COMPREHENSIVE PROGRESS NOTE HD # 2 Kimberly Bishop is a 32 y.o. [phone] at [redacted]w[redacted]d  who is admitted for [facility] with SIPEC and IUGR.   Fetal presentation is unsure. Length of Stay:  2  Days  Subjective: Pt has no complaints this morning. Denies HA or visual changes Patient reports good fetal movement.  She reports no uterine contractions, no bleeding and no loss of fluid per vagina.  Vitals:  Blood pressure (!) 147/82, pulse 64, temperature 97.9 F (36.6 C), temperature source Oral, resp. rate 19, height  (1.575 m), weight 75.4 kg, last menstrual period 12/02/2021, SpO2 100 %.  Physical Examination: Lungs clear Heart RRR Abd soft + BS gravid Ext non tender  Fetal Monitoring:  Baseline: 130 bpm, Variability: Good {> 6 bpm), Accelerations: Non-reactive but appropriate for gestational age, and Decelerations: Absent  Labs:  No results found for this or any previous visit (from the past 24 hour(s)).  Imaging Studies:    NA   Medications:  Scheduled  docusate sodium  100 mg Oral Daily   labetalol  400 mg Oral BID   prenatal multivitamin  1 tablet Oral Q1200   I have reviewed the patient's current medications.  ASSESSMENT: IUP 30 0/7 weeks CHTN with SIPEC IUGR HIV Prior c section, desires TOLAC  PLAN: Stable. S/P Mag and BMZ. Fetal well being reassuring. In house management until delivery at 34 weeks or per maternal/fetal indications Continue routine antenatal care.   Hermina Staggers 06/30/2022,7:21 AM

## 2022-07-01 DIAGNOSIS — Z3A30 30 weeks gestation of pregnancy: Secondary | ICD-10-CM | POA: Diagnosis not present

## 2022-07-01 DIAGNOSIS — O365930 Maternal care for other known or suspected poor fetal growth, third trimester, not applicable or unspecified: Secondary | ICD-10-CM | POA: Diagnosis not present

## 2022-07-01 LAB — CBC
HCT: 30.4 % — ABNORMAL LOW (ref 36.0–46.0)
Hemoglobin: 10.1 g/dL — ABNORMAL LOW (ref 12.0–15.0)
MCH: 29.3 pg (ref 26.0–34.0)
MCHC: 33.2 g/dL (ref 30.0–36.0)
MCV: 88.1 fL (ref 80.0–100.0)
Platelets: 293 10*3/uL (ref 150–400)
RBC: 3.45 MIL/uL — ABNORMAL LOW (ref 3.87–5.11)
RDW: 14.7 % (ref 11.5–15.5)
WBC: 10.5 10*3/uL (ref 4.0–10.5)
nRBC: 0.9 % — ABNORMAL HIGH (ref 0.0–0.2)

## 2022-07-01 MED ORDER — NIFEDIPINE ER OSMOTIC RELEASE 30 MG PO TB24
30.0000 mg | ORAL_TABLET | Freq: Two times a day (BID) | ORAL | Status: DC
  Administered 2022-07-01 – 2022-07-02 (×3): 30 mg via ORAL
  Filled 2022-07-01 (×3): qty 1

## 2022-07-01 MED ORDER — LACTATED RINGERS IV BOLUS
500.0000 mL | Freq: Once | INTRAVENOUS | Status: AC
  Administered 2022-07-01: 500 mL via INTRAVENOUS

## 2022-07-01 NOTE — Progress Notes (Signed)
Patient ID: Kimberly Bishop, female   DOB: 10/20/1990, 32 y.o.   MRN: [id number] ACULTY PRACTICE ANTEPARTUM COMPREHENSIVE PROGRESS NOTE  Kimberly Bishop is a 32 y.o. [id number] at [redacted]w[redacted]d  who is admitted for [facility] with SIPEC and IUGR.   Fetal presentation is unsure. Length of Stay:  3  Days  Subjective: Pt has no complaints this morning. Denies HA or visual changes. Patient reports good fetal movement.  She reports no uterine contractions, no bleeding and no loss of fluid per vagina.  Vitals:  Blood pressure (!) 158/87, pulse 66, temperature 98.6 F (37 C), temperature source Oral, resp. rate 18, height  (1.575 m), weight 75.4 kg, last menstrual period 12/02/2021, SpO2 100 %.  Physical Examination: Lungs clear Heart RRR Abd soft + BS gravid non tender Ext non tender  Fetal Monitoring:  Baseline: 130 bpm, Variability: Good {> 6 bpm), Accelerations: Reactive, and Decelerations: Absent  Labs:  Results for orders placed or performed during the hospital encounter of 06/28/22 (from the past 24 hour(s))  CBC   Collection Time: 07/01/22 12:40 AM  Result Value Ref Range   WBC 10.5 4.0 - 10.5 K/uL   RBC 3.45 (L) 3.87 - 5.11 MIL/uL   Hemoglobin 10.1 (L) 12.0 - 15.0 g/dL   HCT 57.8 (L) 46.9 - 62.9 %   MCV 88.1 80.0 - 100.0 fL   MCH 29.3 26.0 - 34.0 pg   MCHC 33.2 30.0 - 36.0 g/dL   RDW 52.8 41.3 - 24.4 %   Platelets 293 150 - 400 K/uL   nRBC 0.9 (H) 0.0 - 0.2 %    Imaging Studies:    NA   Medications:  Scheduled  docusate sodium  100 mg Oral Daily   labetalol  400 mg Oral BID   prenatal multivitamin  1 tablet Oral Q1200   I have reviewed the patient's current medications.  ASSESSMENT: IUP 32 1/7 weeks CHTN with SIPEC IUGR HIV Prior c section, desires TOLAC  PLAN: Stable. S/P Mag and BMZ. Fetal well being reassuring. Serial growth scans and antenatal testing as per MFM. In house management until delivery at 34 weeks or for maternal/fetal indications Continue routine  antenatal care.   Hermina Staggers 07/01/2022,7:59 AM

## 2022-07-02 ENCOUNTER — Encounter (HOSPITAL_COMMUNITY): Payer: Self-pay | Admitting: Obstetrics and Gynecology

## 2022-07-02 DIAGNOSIS — Z3A30 30 weeks gestation of pregnancy: Secondary | ICD-10-CM

## 2022-07-02 DIAGNOSIS — O1414 Severe pre-eclampsia complicating childbirth: Principal | ICD-10-CM | POA: Diagnosis not present

## 2022-07-02 DIAGNOSIS — O1420 HELLP syndrome (HELLP), unspecified trimester: Secondary | ICD-10-CM | POA: Diagnosis not present

## 2022-07-02 LAB — COMPREHENSIVE METABOLIC PANEL
ALT: 85 U/L — ABNORMAL HIGH (ref 0–44)
ALT: 84 U/L — ABNORMAL HIGH (ref 0–44)
AST: 66 U/L — ABNORMAL HIGH (ref 15–41)
AST: 76 U/L — ABNORMAL HIGH (ref 15–41)
Albumin: 2.7 g/dL — ABNORMAL LOW (ref 3.5–5.0)
Albumin: 2.8 g/dL — ABNORMAL LOW (ref 3.5–5.0)
Alkaline Phosphatase: 136 U/L — ABNORMAL HIGH (ref 38–126)
Alkaline Phosphatase: 140 U/L — ABNORMAL HIGH (ref 38–126)
Anion gap: 9 (ref 5–15)
Anion gap: 13 (ref 5–15)
BUN: 32 mg/dL — ABNORMAL HIGH (ref 6–20)
BUN: 35 mg/dL — ABNORMAL HIGH (ref 6–20)
CO2: 17 mmol/L — ABNORMAL LOW (ref 22–32)
CO2: 18 mmol/L — ABNORMAL LOW (ref 22–32)
Calcium: 8.4 mg/dL — ABNORMAL LOW (ref 8.9–10.3)
Calcium: 9.4 mg/dL (ref 8.9–10.3)
Chloride: 104 mmol/L (ref 98–111)
Chloride: 102 mmol/L (ref 98–111)
Creatinine, Ser: 1.2 mg/dL — ABNORMAL HIGH (ref 0.44–1.00)
Creatinine, Ser: 1.39 mg/dL — ABNORMAL HIGH (ref 0.44–1.00)
GFR, Estimated: >60 mL/min (ref >60)
GFR, Estimated: 52 mL/min — ABNORMAL LOW (ref >60)
Glucose, Bld: 86 mg/dL (ref 70–99)
Glucose, Bld: 90 mg/dL (ref 70–99)
Potassium: 4.9 mmol/L (ref 3.5–5.1)
Potassium: 5.2 mmol/L — ABNORMAL HIGH (ref 3.5–5.1)
Sodium: 130 mmol/L — ABNORMAL LOW (ref 135–145)
Sodium: 133 mmol/L — ABNORMAL LOW (ref 135–145)
Total Bilirubin: 0.5 mg/dL (ref 0.3–1.2)
Total Bilirubin: 0.5 mg/dL (ref 0.3–1.2)
Total Protein: 6.7 g/dL (ref 6.5–8.1)
Total Protein: 7 g/dL (ref 6.5–8.1)

## 2022-07-02 LAB — PROTIME-INR
INR: 1 (ref 0.8–1.2)
Prothrombin Time: 12.9 seconds (ref 11.4–15.2)

## 2022-07-02 LAB — CBC
HCT: 30.6 % — ABNORMAL LOW (ref 36.0–46.0)
HCT: 33.3 % — ABNORMAL LOW (ref 36.0–46.0)
Hemoglobin: 10.2 g/dL — ABNORMAL LOW (ref 12.0–15.0)
Hemoglobin: 11.1 g/dL — ABNORMAL LOW (ref 12.0–15.0)
MCH: 29.1 pg (ref 26.0–34.0)
MCH: 29.3 pg (ref 26.0–34.0)
MCHC: 33.3 g/dL (ref 30.0–36.0)
MCHC: 33.3 g/dL (ref 30.0–36.0)
MCV: 87.4 fL (ref 80.0–100.0)
MCV: 87.9 fL (ref 80.0–100.0)
Platelets: 286 10*3/uL (ref 150–400)
Platelets: 324 10*3/uL (ref 150–400)
RBC: 3.5 MIL/uL — ABNORMAL LOW (ref 3.87–5.11)
RBC: 3.79 MIL/uL — ABNORMAL LOW (ref 3.87–5.11)
RDW: 14.2 % (ref 11.5–15.5)
RDW: 13.9 % (ref 11.5–15.5)
WBC: 10.5 10*3/uL (ref 4.0–10.5)
WBC: 11.8 10*3/uL — ABNORMAL HIGH (ref 4.0–10.5)
nRBC: 1.1 % — ABNORMAL HIGH (ref 0.0–0.2)
nRBC: 1.4 % — ABNORMAL HIGH (ref 0.0–0.2)

## 2022-07-02 LAB — TYPE AND SCREEN
ABO/RH(D): O POS
Antibody Screen: NEGATIVE

## 2022-07-02 LAB — APTT: aPTT: 25 seconds (ref 24–36)

## 2022-07-02 LAB — FIBRINOGEN: Fibrinogen: 476 mg/dL — ABNORMAL HIGH (ref 210–475)

## 2022-07-02 LAB — MAGNESIUM: Magnesium: 2.4 mg/dL (ref 1.7–2.4)

## 2022-07-02 MED ORDER — ZIDOVUDINE 10 MG/ML IV SOLN
1.0000 mg/kg/h | INTRAVENOUS | Status: DC
  Administered 2022-07-03: 1 mg/kg/h via INTRAVENOUS
  Filled 2022-07-02 (×2): qty 40

## 2022-07-02 MED ORDER — MAGNESIUM SULFATE BOLUS VIA INFUSION
4.0000 g | Freq: Once | INTRAVENOUS | Status: AC
  Administered 2022-07-02: 4 g via INTRAVENOUS
  Filled 2022-07-02: qty 1000

## 2022-07-02 MED ORDER — ZIDOVUDINE 10 MG/ML IV SOLN
2.0000 mg/kg | Freq: Once | INTRAVENOUS | Status: AC
  Administered 2022-07-03: 151 mg via INTRAVENOUS
  Filled 2022-07-02: qty 15.1

## 2022-07-02 MED ORDER — MAGNESIUM SULFATE 40 GM/1000ML IV SOLN
2.0000 g/h | INTRAVENOUS | Status: DC
  Administered 2022-07-02: 2 g/h via INTRAVENOUS
  Filled 2022-07-02: qty 1000

## 2022-07-02 MED ORDER — CEFAZOLIN SODIUM-DEXTROSE 2-4 GM/100ML-% IV SOLN: 2.0000 g | INTRAVENOUS | Status: DC

## 2022-07-02 MED ORDER — SOD CITRATE-CITRIC ACID 500-334 MG/5ML PO SOLN
30.0000 mL | ORAL | Status: AC
  Administered 2022-07-03: 30 mL via ORAL

## 2022-07-02 MED ORDER — LABETALOL HCL 5 MG/ML IV SOLN: 80.0000 mg | INTRAVENOUS | Status: DC | PRN

## 2022-07-02 MED ORDER — HYDRALAZINE HCL 20 MG/ML IJ SOLN
10.0000 mg | INTRAMUSCULAR | Status: DC | PRN
Start: 2022-07-02 — End: 2022-07-02

## 2022-07-02 MED ORDER — LACTATED RINGERS IV SOLN: INTRAVENOUS | Status: DC

## 2022-07-02 MED ORDER — LABETALOL HCL 5 MG/ML IV SOLN
20.0000 mg | INTRAVENOUS | Status: DC | PRN
Start: 2022-07-02 — End: 2022-07-02

## 2022-07-02 MED ORDER — LABETALOL HCL 5 MG/ML IV SOLN: 40.0000 mg | INTRAVENOUS | Status: DC | PRN

## 2022-07-02 NOTE — Anesthesia Preprocedure Evaluation (Signed)
Anesthesia Evaluation  Patient identified by MRN, date of birth, ID band Patient awake    Reviewed: Allergy & Precautions, NPO status , Patient's Chart, lab work & pertinent test results, reviewed documented beta blocker date and time   Airway Mallampati: I  TM Distance: >3 FB Neck ROM: Full    Dental no notable dental hx. (+) Dental Advisory Given, Teeth Intact   Pulmonary neg pulmonary ROS   Pulmonary exam normal breath sounds clear to auscultation       Cardiovascular hypertension, Pt. on medications and Pt. on home beta blockers Normal cardiovascular exam Rhythm:Regular Rate:Normal     Neuro/Psych negative neurological ROS  negative psych ROS   GI/Hepatic ,GERD  Medicated,,  Endo/Other  Hypercholesterolemia Obesity  Renal/GU Renal InsufficiencyRenal diseaseHx/o renal calculi  negative genitourinary   Musculoskeletal negative musculoskeletal ROS (+)    Abdominal  (+) + obese  Peds  Hematology  (+) Blood dyscrasia, anemia , HIV  Anesthesia Other Findings   Reproductive/Obstetrics (+) Pregnancy Previous C/Section Pre Eclampsia- with severe features HELLP syndrome                              Anesthesia Physical Anesthesia Plan  ASA: 3 and emergent  Anesthesia Plan: Spinal   Post-op Pain Management: Minimal or no pain anticipated   Induction: Intravenous  PONV Risk Score and Plan:   Airway Management Planned:   Additional Equipment:   Intra-op Plan:   Post-operative Plan:   Informed Consent:   Plan Discussed with:   Anesthesia Plan Comments:          Anesthesia Quick Evaluation

## 2022-07-02 NOTE — Consult Note (Signed)
[facility]  Prenatal Consult       07/02/2022  8:51 AM   I was asked by Dr. Vergie Living to consult on this patient for anticipated preterm delivery. I had the pleasure of meeting with Ms. Gulbranson today. She is a 32 year old [phone] currently at [redacted]w[redacted]d. Pregnancy complicated by pre-eclampsia, growth restriction, and well managed HIV (undetectable). She and her partner are expecting a baby boy, to be named Denzel. She is currently hospitalized for pre-eclampsia which has been well controlled, so hoping to continue pregnancy until 34 weeks and planning for TOLAC. She previously had a C-section at 36 weeks for fetal growth restriction (47-year-old daughter named Dream). Ms. Hagmann husband was not present for consultation and we offered to return or call him to answer any questions he may have.  I explained that the neonatal intensive care team would be present for the delivery and outlined the likely delivery room course for this baby including routine resuscitation and NRP-guided approaches to the treatment of respiratory distress. We discussed other common problems associated with prematurity including respiratory distress syndrome/CLD, apnea, feeding issues, temperature regulation, and infection risk. We briefly discussed IVH/PVL, ROP, and NEC and that these are complications associated with prematurity, but that by 30 weeks are uncommon.   We discussed the average length of stay but I noted that the actual LOS would depend on the severity of problems encountered and response to treatments. We discussed visitation policies and the resources available while her child is in the hospital.  We discussed the importance of good nutrition and various methods of providing nutrition (parenteral hyperalimentation, gavage feedings and/or oral feeding). We discussed the benefits of human milk. We discussed the possibility of MBM use if her viral loads remains undetectable. I encouraged  pumping soon after birth and outlined resources that are available to support breast feeding. We discussed the possibility of using donor breast milk as a bridge and she and her husband will consider this.  Thank you for involving [place] in the care of this patient. A member of our team will be available should the family have additional questions. Time for consultation: approximately 20 minutes of face-to-face time in discussion of the risks and medical care associated with preterm delivery.  Jacob Moores, MD Neonatal Medicine

## 2022-07-02 NOTE — Progress Notes (Addendum)
Daily Antepartum Note  Admission Date: 06/28/2022 Current Date: 07/02/2022 7:44 AM  Kimberly Bishop is a 32 y.o. [id number] @ [redacted]w[redacted]d, admitted for severe pre-eclampsia ?superimposed on CHTN.  Pregnancy complicated by: Patient Active Problem List   Diagnosis Date Noted   Fetal growth restriction antepartum 06/28/2022   Preeclampsia, second trimester 06/22/2022   Asymptomatic bacteriuria during pregnancy 02/25/2022   Hx of preeclampsia, prior pregnancy, currently pregnant, third trimester 02/22/2022   Supervision of high risk pregnancy, antepartum 02/08/2022   Vitamin D deficiency 07/22/2021   Nephrolithiasis 01/18/2021   History of cesarean delivery 04/29/2019   History of pre-eclampsia in prior pregnancy, currently pregnant in first trimester 03/29/2019   HIV (human immunodeficiency virus infection) 03/29/2019   History of intrauterine growth restriction and stillbirth, currently pregnant 03/26/2019   Rubella non-immune status, antepartum 02/15/2019   CIN I (cervical intraepithelial neoplasia I) 11/28/2018   ASCUS with positive high risk HPV cervical 11/26/2018    Overnight/24hr events:  Procardia added yesterday  Subjective:  No s/s of pre-eclampsia, decreased FM or PTL.   Objective:    Current Vital Signs 24h Vital Sign Ranges  T 97.9 F (36.6 C) Temp  Avg: 98 F (36.7 C)  Min: 97.9 F (36.6 C)  Max: 98.2 F (36.8 C)  BP 126/70 BP  Min: 123/72  Max: 175/91  HR 79 Pulse  Avg: 73.6  Min: 64  Max: 81  RR 16 Resp  Avg: 17.7  Min: 16  Max: 19  SaO2 97 % Room Air SpO2  Avg: 98.2 %  Min: 96 %  Max: 100 %       24 Hour I/O Current Shift I/O  Time Ins Outs 04/19 0701 - 04/20 0700 In: 870 [P.O.:870] Out: 2750 [Urine:2750] No intake/output data recorded.   Patient Vitals for the past 24 hrs:  BP Temp Temp src Pulse Resp SpO2  07/02/22 0415 126/70 97.9 F (36.6 C) Oral 79 16 97 %  07/01/22 2331 123/72 98 F (36.7 C) Oral 81 17 96 %  07/01/22 2041 125/76 97.9 F (36.6 C)  Oral 81 17 98 %  07/01/22 1640 138/80 98 F (36.7 C) Oral 78 18 99 %  07/01/22 1208 (!) 144/82 97.9 F (36.6 C) Oral 66 19 100 %  07/01/22 0931 (!) 149/81 -- -- 66 -- --  07/01/22 0904 (!) 175/91 -- -- -- -- --  07/01/22 0849 (!) 166/89 98.2 F (36.8 C) Oral 64 19 99 %   Fetal Heart Tones: 135 baseline, +accels, ?one slight decel, min to mod variability (stable) Tocometry: quiet  Physical exam: General: Well nourished, well developed female in no acute distress. Abdomen: gravid, nttp Cardiovascular: S1, S2 normal, no murmur, rub or gallop, regular rate and rhythm Respiratory: CTAB Extremities: no clubbing, cyanosis or edema Skin: Warm and dry.   Medications: Current Facility-Administered Medications  Medication Dose Route Frequency Provider Last Rate Last Admin   acetaminophen (TYLENOL) tablet 650 mg  650 mg Oral Q4H PRN Constant, Peggy, MD   650 mg at 06/29/22 0803   calcium carbonate (TUMS - dosed in mg elemental calcium) chewable tablet 400 mg of elemental calcium  2 tablet Oral Q4H PRN Constant, Peggy, MD       docusate sodium (COLACE) capsule 100 mg  100 mg Oral Daily Constant, Peggy, MD   100 mg at 06/30/22 1016   hydrALAZINE (APRESOLINE) injection 5 mg  5 mg Intravenous PRN Constant, Peggy, MD   5 mg at 07/01/22 0920   And  hydrALAZINE (APRESOLINE) injection 10 mg  10 mg Intravenous PRN Constant, Peggy, MD   10 mg at 06/28/22 1153   And   labetalol (NORMODYNE) injection 20 mg  20 mg Intravenous PRN Constant, Peggy, MD       And   labetalol (NORMODYNE) injection 40 mg  40 mg Intravenous PRN Constant, Peggy, MD       labetalol (NORMODYNE) tablet 400 mg  400 mg Oral BID Constant, Peggy, MD   400 mg at 07/01/22 2128   lactated ringers infusion   Intravenous Continuous Constant, Peggy, MD   Stopped at 06/29/22 0956   NIFEdipine (PROCARDIA-XL/NIFEDICAL-XL) 24 hr tablet 30 mg  30 mg Oral BID Warden Fillers, MD   30 mg at 07/01/22 2130   prenatal multivitamin tablet 1 tablet  1  tablet Oral Q1200 Constant, Peggy, MD   1 tablet at 07/01/22 1144    Labs:  Recent Labs  Lab 06/28/22 1213 07/01/22 0040  WBC 9.0 10.5  HGB 11.0* 10.1*  HCT 33.4* 30.4*  PLT 293 293    Recent Labs  Lab 06/28/22 1213  NA 133*  K 4.3  CL 103  CO2 19*  BUN 12  CREATININE 1.04*  CALCIUM 9.1  PROT 7.3  BILITOT 0.6  ALKPHOS 132*  ALT 40  AST 39  GLUCOSE 69*   Radiology:  4/18: UA dopplers wnl, afi 11, cephalic 4/16: 7%, 1224gm, ac 40%, UA dopplers wnl, cephalic  Assessment & Plan:  Patient stable *Pregnancy: reactive NST. Inpatient nexplanon.  *Severe pre-eclampsia: continue on current regimen. Will get repeat labs today and ordered for q72h. Repeat dopplers ordered for 4/21. Qshift NSTs. D/w her [id number] for delivery *H/o PLTCS: in 2021 for HIV. Patient desires tolac. I d/w her re: r/b of tolac vs rpt c-section, need for emergency c-section, uterine rupture, etc. I also d/w her that there's a decent chance the baby may not be able to tolerate labor which would increase risks with the c/s due to having been in labor. As of now, pt desires to tolac, consent signed today *Preterm: s/p BMZ on 4/16 and 4/17. NICU called and to consult today *HIV: continue meds. VL undetectable on 4/9 *PPx: SCDs, OOB ad lib *FEN/GI: SLIV, regular diet *Dispo: inpatient until delivery  Cornelia Copa MD Attending Center for [facility] (Faculty Practice) GYN Consult Phone: [phone] (M-F, [phone]) & [phone]  (Off hours, weekends, holidays)

## 2022-07-02 NOTE — Progress Notes (Addendum)
OB Note Labs a little worse but okay to wait full eight hours and plan for 0400 repeat c-section. Plan to check labs, including a Mg level at 0200. Pt amenable to foley placement now for accurate I/O given Cr. Follow up K at next draw     Latest Ref Rng & Units 07/02/2022    9:08 PM 07/02/2022    7:26 AM 07/01/2022   12:40 AM  CBC  WBC 4.0 - 10.5 K/uL 11.8  10.5  10.5   Hemoglobin 12.0 - 15.0 g/dL 09.8  11.9  14.7   Hematocrit 36.0 - 46.0 % 33.3  30.6  30.4   Platelets 150 - 400 K/uL 324  286  293       Latest Ref Rng & Units 07/02/2022    9:08 PM 07/02/2022    7:26 AM 06/28/2022   12:13 PM  CMP  Glucose 70 - 99 mg/dL 90  86  69   BUN 6 - 20 mg/dL 35  32  12   Creatinine 0.44 - 1.00 mg/dL 8.29  5.62  1.30   Sodium 135 - 145 mmol/L 133  130  133   Potassium 3.5 - 5.1 mmol/L 5.2  4.9  4.3   Chloride 98 - 111 mmol/L 102  104  103   CO2 22 - 32 mmol/L Calcium 8.9 - 10.3 mg/dL 9.4  8.4  9.1   Total Protein 6.5 - 8.1 g/dL 7.0  6.7  7.3   Total Bilirubin 0.3 - 1.2 mg/dL 0.5  0.5  0.6   Alkaline Phos 38 - 126 U/L 140  136  132   AST 15 - 41 U/L 76  66  39   ALT 0 - 44 U/L 84  85  40    PT/INR/PTT/Fibrinogen: 12.11/13/23/476  Cornelia Copa MD Attending Center for [employer] (Faculty Practice) 07/02/2022 Time: 2209

## 2022-07-02 NOTE — Progress Notes (Signed)
OB Note I told her I was reviewing her labwork that I ordered this morning and it came back worse with now concern for HELLP syndrome; I told her I'm not sure why this wasn't seen during the dayshift. I told her I recommend moving forward with delivery given the change in blood work and given her GA, FGR, new HELLP and no PTL s/s I recommend repeat c-section given potential for fetal intolerance of labor and worsening HELLP with likely long IOL.   Patient just ate a full dinner at 2000. I told her that if her labs are acceptable and baby looks okay then we can wait the full eight hours. In the interim, I told her we'll make her NPO and start Mg and pre op AZT. Repeat c-section consent signed.   Cornelia Copa MD Attending Center for [employer] (Faculty Practice) 07/02/2022 Time: 2043

## 2022-07-03 ENCOUNTER — Inpatient Hospital Stay (HOSPITAL_COMMUNITY): Payer: Medicaid Other | Admitting: Anesthesiology

## 2022-07-03 ENCOUNTER — Encounter (HOSPITAL_COMMUNITY)
Admission: AD | Disposition: A | Discharge status: Home / Self Care | Payer: Self-pay | Source: Home / Self Care | Attending: Obstetrics and Gynecology

## 2022-07-03 ENCOUNTER — Other Ambulatory Visit: Payer: Self-pay

## 2022-07-03 ENCOUNTER — Encounter (HOSPITAL_COMMUNITY): Payer: Self-pay | Admitting: Obstetrics and Gynecology

## 2022-07-03 DIAGNOSIS — Z3A29 29 weeks gestation of pregnancy: Secondary | ICD-10-CM

## 2022-07-03 DIAGNOSIS — O9902 Anemia complicating childbirth: Secondary | ICD-10-CM | POA: Diagnosis not present

## 2022-07-03 DIAGNOSIS — O164 Unspecified maternal hypertension, complicating childbirth: Secondary | ICD-10-CM

## 2022-07-03 DIAGNOSIS — O9872 Human immunodeficiency virus [HIV] disease complicating childbirth: Secondary | ICD-10-CM | POA: Diagnosis not present

## 2022-07-03 DIAGNOSIS — O1414 Severe pre-eclampsia complicating childbirth: Secondary | ICD-10-CM | POA: Diagnosis not present

## 2022-07-03 DIAGNOSIS — D649 Anemia, unspecified: Secondary | ICD-10-CM | POA: Diagnosis not present

## 2022-07-03 DIAGNOSIS — O365930 Maternal care for other known or suspected poor fetal growth, third trimester, not applicable or unspecified: Secondary | ICD-10-CM | POA: Diagnosis not present

## 2022-07-03 DIAGNOSIS — O1424 HELLP syndrome, complicating childbirth: Secondary | ICD-10-CM

## 2022-07-03 DIAGNOSIS — O34211 Maternal care for low transverse scar from previous cesarean delivery: Secondary | ICD-10-CM | POA: Diagnosis not present

## 2022-07-03 LAB — COMPREHENSIVE METABOLIC PANEL
ALT: 126 U/L — ABNORMAL HIGH (ref 0–44)
ALT: 133 U/L — ABNORMAL HIGH (ref 0–44)
ALT: 126 U/L — ABNORMAL HIGH (ref 0–44)
AST: 138 U/L — ABNORMAL HIGH (ref 15–41)
AST: 162 U/L — ABNORMAL HIGH (ref 15–41)
AST: 126 U/L — ABNORMAL HIGH (ref 15–41)
Albumin: 2.6 g/dL — ABNORMAL LOW (ref 3.5–5.0)
Albumin: 2.7 g/dL — ABNORMAL LOW (ref 3.5–5.0)
Albumin: 2.7 g/dL — ABNORMAL LOW (ref 3.5–5.0)
Alkaline Phosphatase: 141 U/L — ABNORMAL HIGH (ref 38–126)
Alkaline Phosphatase: 145 U/L — ABNORMAL HIGH (ref 38–126)
Alkaline Phosphatase: 140 U/L — ABNORMAL HIGH (ref 38–126)
Anion gap: 11 (ref 5–15)
Anion gap: 12 (ref 5–15)
Anion gap: 10 (ref 5–15)
BUN: 34 mg/dL — ABNORMAL HIGH (ref 6–20)
BUN: 33 mg/dL — ABNORMAL HIGH (ref 6–20)
BUN: 27 mg/dL — ABNORMAL HIGH (ref 6–20)
CO2: 17 mmol/L — ABNORMAL LOW (ref 22–32)
CO2: 18 mmol/L — ABNORMAL LOW (ref 22–32)
CO2: 20 mmol/L — ABNORMAL LOW (ref 22–32)
Calcium: 8.7 mg/dL — ABNORMAL LOW (ref 8.9–10.3)
Calcium: 8 mg/dL — ABNORMAL LOW (ref 8.9–10.3)
Calcium: 7.6 mg/dL — ABNORMAL LOW (ref 8.9–10.3)
Chloride: 102 mmol/L (ref 98–111)
Chloride: 99 mmol/L (ref 98–111)
Chloride: 98 mmol/L (ref 98–111)
Creatinine, Ser: 1.3 mg/dL — ABNORMAL HIGH (ref 0.44–1.00)
Creatinine, Ser: 1.35 mg/dL — ABNORMAL HIGH (ref 0.44–1.00)
Creatinine, Ser: 1.24 mg/dL — ABNORMAL HIGH (ref 0.44–1.00)
GFR, Estimated: 56 mL/min — ABNORMAL LOW (ref >60)
GFR, Estimated: 54 mL/min — ABNORMAL LOW (ref >60)
GFR, Estimated: 60 mL/min — ABNORMAL LOW (ref >60)
Glucose, Bld: 92 mg/dL (ref 70–99)
Glucose, Bld: 110 mg/dL — ABNORMAL HIGH (ref 70–99)
Glucose, Bld: 133 mg/dL — ABNORMAL HIGH (ref 70–99)
Potassium: 4.7 mmol/L (ref 3.5–5.1)
Potassium: 5.4 mmol/L — ABNORMAL HIGH (ref 3.5–5.1)
Potassium: 5.7 mmol/L — ABNORMAL HIGH (ref 3.5–5.1)
Sodium: 130 mmol/L — ABNORMAL LOW (ref 135–145)
Sodium: 129 mmol/L — ABNORMAL LOW (ref 135–145)
Sodium: 128 mmol/L — ABNORMAL LOW (ref 135–145)
Total Bilirubin: 0.5 mg/dL (ref 0.3–1.2)
Total Bilirubin: 0.3 mg/dL (ref 0.3–1.2)
Total Bilirubin: 0.6 mg/dL (ref 0.3–1.2)
Total Protein: 6.8 g/dL (ref 6.5–8.1)
Total Protein: 7 g/dL (ref 6.5–8.1)
Total Protein: 7.2 g/dL (ref 6.5–8.1)

## 2022-07-03 LAB — CBC
HCT: 31.7 % — ABNORMAL LOW (ref 36.0–46.0)
HCT: 33.7 % — ABNORMAL LOW (ref 36.0–46.0)
HCT: 31.9 % — ABNORMAL LOW (ref 36.0–46.0)
Hemoglobin: 10.8 g/dL — ABNORMAL LOW (ref 12.0–15.0)
Hemoglobin: 10.9 g/dL — ABNORMAL LOW (ref 12.0–15.0)
Hemoglobin: 10.9 g/dL — ABNORMAL LOW (ref 12.0–15.0)
MCH: 29.6 pg (ref 26.0–34.0)
MCH: 28.5 pg (ref 26.0–34.0)
MCH: 29.2 pg (ref 26.0–34.0)
MCHC: 34.1 g/dL (ref 30.0–36.0)
MCHC: 32.3 g/dL (ref 30.0–36.0)
MCHC: 34.2 g/dL (ref 30.0–36.0)
MCV: 86.8 fL (ref 80.0–100.0)
MCV: 88 fL (ref 80.0–100.0)
MCV: 85.5 fL (ref 80.0–100.0)
Platelets: 281 10*3/uL (ref 150–400)
Platelets: 279 10*3/uL (ref 150–400)
Platelets: 275 10*3/uL (ref 150–400)
RBC: 3.65 MIL/uL — ABNORMAL LOW (ref 3.87–5.11)
RBC: 3.83 MIL/uL — ABNORMAL LOW (ref 3.87–5.11)
RBC: 3.73 MIL/uL — ABNORMAL LOW (ref 3.87–5.11)
RDW: 13.8 % (ref 11.5–15.5)
RDW: 13.8 % (ref 11.5–15.5)
RDW: 13.6 % (ref 11.5–15.5)
WBC: 13.6 10*3/uL — ABNORMAL HIGH (ref 4.0–10.5)
WBC: 17.1 10*3/uL — ABNORMAL HIGH (ref 4.0–10.5)
WBC: 18.1 10*3/uL — ABNORMAL HIGH (ref 4.0–10.5)
nRBC: 1 % — ABNORMAL HIGH (ref 0.0–0.2)
nRBC: 0.5 % — ABNORMAL HIGH (ref 0.0–0.2)
nRBC: 0.2 % (ref 0.0–0.2)

## 2022-07-03 LAB — MAGNESIUM
Magnesium: 6.9 mg/dL (ref 1.7–2.4)
Magnesium: 8.2 mg/dL (ref 1.7–2.4)
Magnesium: 7.9 mg/dL (ref 1.7–2.4)
Magnesium: 7.3 mg/dL (ref 1.7–2.4)

## 2022-07-03 SURGERY — Surgical Case
Anesthesia: Spinal

## 2022-07-03 MED ORDER — ONDANSETRON HCL 4 MG/2ML IJ SOLN
INTRAMUSCULAR | Status: DC | PRN
  Administered 2022-07-03: 4 mg via INTRAVENOUS

## 2022-07-03 MED ORDER — FUROSEMIDE 20 MG PO TABS
20.0000 mg | ORAL_TABLET | Freq: Two times a day (BID) | ORAL | Status: DC
  Administered 2022-07-03 – 2022-07-06 (×7): 20 mg via ORAL
  Filled 2022-07-03 (×7): qty 1

## 2022-07-03 MED ORDER — DIPHENHYDRAMINE HCL 25 MG PO CAPS: 25.0000 mg | ORAL_CAPSULE | Freq: Four times a day (QID) | ORAL | Status: DC | PRN

## 2022-07-03 MED ORDER — SODIUM CHLORIDE 0.9 % IR SOLN
Status: DC | PRN
  Administered 2022-07-03: 1 via INTRAVESICAL

## 2022-07-03 MED ORDER — STERILE WATER FOR IRRIGATION IR SOLN
Status: DC | PRN
  Administered 2022-07-03: 1000 mL

## 2022-07-03 MED ORDER — WITCH HAZEL-GLYCERIN EX PADS: 1.0000 | MEDICATED_PAD | CUTANEOUS | Status: DC | PRN

## 2022-07-03 MED ORDER — CEFAZOLIN SODIUM-DEXTROSE 2-4 GM/100ML-% IV SOLN
INTRAVENOUS | Status: AC
  Filled 2022-07-03: qty 100

## 2022-07-03 MED ORDER — MORPHINE SULFATE (PF) 0.5 MG/ML IJ SOLN
INTRAMUSCULAR | Status: AC
  Filled 2022-07-03: qty 10

## 2022-07-03 MED ORDER — ENOXAPARIN SODIUM 40 MG/0.4ML IJ SOSY
40.0000 mg | PREFILLED_SYRINGE | INTRAMUSCULAR | Status: DC
  Administered 2022-07-04 – 2022-07-06 (×3): 40 mg via SUBCUTANEOUS
  Filled 2022-07-03 (×3): qty 0.4

## 2022-07-03 MED ORDER — MENTHOL 3 MG MT LOZG: 1.0000 | LOZENGE | OROMUCOSAL | Status: DC | PRN

## 2022-07-03 MED ORDER — EPHEDRINE 5 MG/ML INJ
INTRAVENOUS | Status: AC
  Filled 2022-07-03: qty 5

## 2022-07-03 MED ORDER — FENTANYL CITRATE (PF) 100 MCG/2ML IJ SOLN
INTRAMUSCULAR | Status: AC
  Filled 2022-07-03: qty 2

## 2022-07-03 MED ORDER — EPHEDRINE SULFATE (PRESSORS) 50 MG/ML IJ SOLN
INTRAMUSCULAR | Status: DC | PRN
  Administered 2022-07-03: 10 mg via INTRAVENOUS
  Administered 2022-07-03 (×5): 5 mg via INTRAVENOUS

## 2022-07-03 MED ORDER — SOD CITRATE-CITRIC ACID 500-334 MG/5ML PO SOLN
ORAL | Status: AC
  Filled 2022-07-03: qty 30

## 2022-07-03 MED ORDER — PRENATAL MULTIVITAMIN CH
1.0000 | ORAL_TABLET | Freq: Every day | ORAL | Status: DC
  Administered 2022-07-04 – 2022-07-06 (×3): 1 via ORAL
  Filled 2022-07-03 (×3): qty 1

## 2022-07-03 MED ORDER — FENTANYL CITRATE (PF) 100 MCG/2ML IJ SOLN
INTRAMUSCULAR | Status: DC | PRN
  Administered 2022-07-03: 15 ug via INTRATHECAL

## 2022-07-03 MED ORDER — ONDANSETRON HCL 4 MG/2ML IJ SOLN
INTRAMUSCULAR | Status: AC
  Filled 2022-07-03: qty 2

## 2022-07-03 MED ORDER — LACTATED RINGERS IV SOLN: INTRAVENOUS | Status: DC | PRN

## 2022-07-03 MED ORDER — DEXAMETHASONE SODIUM PHOSPHATE 10 MG/ML IJ SOLN
INTRAMUSCULAR | Status: DC | PRN
  Administered 2022-07-03: 10 mg via INTRAVENOUS

## 2022-07-03 MED ORDER — OXYTOCIN-SODIUM CHLORIDE 30-0.9 UT/500ML-% IV SOLN
INTRAVENOUS | Status: AC
  Filled 2022-07-03: qty 500

## 2022-07-03 MED ORDER — OXYTOCIN-SODIUM CHLORIDE 30-0.9 UT/500ML-% IV SOLN: 2.5000 [IU]/h | INTRAVENOUS | Status: AC

## 2022-07-03 MED ORDER — FENTANYL CITRATE (PF) 100 MCG/2ML IJ SOLN
25.0000 ug | INTRAMUSCULAR | Status: DC | PRN
  Administered 2022-07-03: 50 ug via INTRAVENOUS

## 2022-07-03 MED ORDER — LACTATED RINGERS IV SOLN: INTRAVENOUS | Status: DC

## 2022-07-03 MED ORDER — DIBUCAINE (PERIANAL) 1 % EX OINT: 1.0000 | TOPICAL_OINTMENT | CUTANEOUS | Status: DC | PRN

## 2022-07-03 MED ORDER — MAGNESIUM SULFATE 40 GM/1000ML IV SOLN
1.0000 g/h | INTRAVENOUS | Status: AC
  Administered 2022-07-03: 1 g/h via INTRAVENOUS
  Filled 2022-07-03: qty 1000

## 2022-07-03 MED ORDER — BUPIVACAINE IN DEXTROSE 0.75-8.25 % IT SOLN
INTRATHECAL | Status: DC | PRN
  Administered 2022-07-03: 1.6 mL via INTRATHECAL

## 2022-07-03 MED ORDER — MORPHINE SULFATE (PF) 0.5 MG/ML IJ SOLN
INTRAMUSCULAR | Status: DC | PRN
  Administered 2022-07-03: .15 mg via INTRATHECAL

## 2022-07-03 MED ORDER — IBUPROFEN 600 MG PO TABS: 600.0000 mg | ORAL_TABLET | Freq: Four times a day (QID) | ORAL | Status: DC | PRN

## 2022-07-03 MED ORDER — SENNOSIDES-DOCUSATE SODIUM 8.6-50 MG PO TABS
2.0000 | ORAL_TABLET | Freq: Every day | ORAL | Status: DC
  Administered 2022-07-04 – 2022-07-06 (×2): 2 via ORAL
  Filled 2022-07-03 (×3): qty 2

## 2022-07-03 MED ORDER — PHENYLEPHRINE HCL-NACL 20-0.9 MG/250ML-% IV SOLN
INTRAVENOUS | Status: DC | PRN
  Administered 2022-07-03: 50 ug/min via INTRAVENOUS

## 2022-07-03 MED ORDER — POLYETHYLENE GLYCOL 3350 17 G PO PACK
17.0000 g | PACK | Freq: Every day | ORAL | Status: DC
  Administered 2022-07-04 – 2022-07-06 (×2): 17 g via ORAL
  Filled 2022-07-03 (×3): qty 1

## 2022-07-03 MED ORDER — DEXAMETHASONE SODIUM PHOSPHATE 10 MG/ML IJ SOLN
INTRAMUSCULAR | Status: AC
  Filled 2022-07-03: qty 1

## 2022-07-03 MED ORDER — NIFEDIPINE ER OSMOTIC RELEASE 30 MG PO TB24
30.0000 mg | ORAL_TABLET | Freq: Every day | ORAL | Status: DC
  Administered 2022-07-03 – 2022-07-05 (×3): 30 mg via ORAL
  Filled 2022-07-03 (×3): qty 1

## 2022-07-03 MED ORDER — GABAPENTIN 100 MG PO CAPS
200.0000 mg | ORAL_CAPSULE | Freq: Two times a day (BID) | ORAL | Status: DC
  Administered 2022-07-03 – 2022-07-06 (×7): 200 mg via ORAL
  Filled 2022-07-03 (×7): qty 2

## 2022-07-03 MED ORDER — SIMETHICONE 80 MG PO CHEW
80.0000 mg | CHEWABLE_TABLET | Freq: Three times a day (TID) | ORAL | Status: DC
  Administered 2022-07-03 – 2022-07-06 (×9): 80 mg via ORAL
  Filled 2022-07-03 (×10): qty 1

## 2022-07-03 MED ORDER — CEFAZOLIN SODIUM-DEXTROSE 2-3 GM-%(50ML) IV SOLR
INTRAVENOUS | Status: DC | PRN
  Administered 2022-07-03: 2 g via INTRAVENOUS

## 2022-07-03 MED ORDER — OXYCODONE HCL 5 MG PO TABS
5.0000 mg | ORAL_TABLET | ORAL | Status: DC | PRN
  Administered 2022-07-04 – 2022-07-06 (×9): 5 mg via ORAL
  Filled 2022-07-03 (×5): qty 1
  Filled 2022-07-03: qty 2
  Filled 2022-07-03 (×4): qty 1

## 2022-07-03 MED ORDER — COCONUT OIL OIL: 1.0000 | TOPICAL_OIL | Status: DC | PRN

## 2022-07-03 MED ORDER — OXYTOCIN-SODIUM CHLORIDE 30-0.9 UT/500ML-% IV SOLN
INTRAVENOUS | Status: DC | PRN
  Administered 2022-07-03: 999 mL/h via INTRAVENOUS

## 2022-07-03 SURGICAL SUPPLY — 34 items
BENZOIN TINCTURE PRP APPL 2/3 (GAUZE/BANDAGES/DRESSINGS) ×1 IMPLANT
CANISTER SUCT 3000ML PPV (MISCELLANEOUS) ×1 IMPLANT
CHLORAPREP W/TINT 26 (MISCELLANEOUS) ×2 IMPLANT
CLAMP UMBILICAL CORD (MISCELLANEOUS) ×1 IMPLANT
DRSG OPSITE POSTOP 4X10 (GAUZE/BANDAGES/DRESSINGS) ×1 IMPLANT
ELECT REM PT RETURN 9FT ADLT (ELECTROSURGICAL) ×1
ELECTRODE REM PT RTRN 9FT ADLT (ELECTROSURGICAL) ×1 IMPLANT
EXTRACTOR VACUUM KIWI (MISCELLANEOUS) ×1 IMPLANT
GAUZE SPONGE 4X4 12PLY STRL LF (GAUZE/BANDAGES/DRESSINGS) IMPLANT
GLOVE BIOGEL PI IND STRL 7.0 (GLOVE) ×2 IMPLANT
GLOVE BIOGEL PI IND STRL 7.5 (GLOVE) ×1 IMPLANT
GLOVE SKINSENSE STRL SZ7.0 (GLOVE) ×1 IMPLANT
GOWN STRL REUS W/ TWL LRG LVL3 (GOWN DISPOSABLE) ×2 IMPLANT
GOWN STRL REUS W/ TWL XL LVL3 (GOWN DISPOSABLE) ×1 IMPLANT
GOWN STRL REUS W/TWL LRG LVL3 (GOWN DISPOSABLE) ×2
GOWN STRL REUS W/TWL XL LVL3 (GOWN DISPOSABLE) ×1
KIT ABG SYR 3ML LUER SLIP (SYRINGE) IMPLANT
NDL HYPO 25X5/8 SAFETYGLIDE (NEEDLE) IMPLANT
NEEDLE HYPO 25X5/8 SAFETYGLIDE (NEEDLE) ×1 IMPLANT
NS IRRIG 1000ML POUR BTL (IV SOLUTION) ×1 IMPLANT
PACK C SECTION WH (CUSTOM PROCEDURE TRAY) ×1 IMPLANT
PAD ABD 7.5X8 STRL (GAUZE/BANDAGES/DRESSINGS) ×1 IMPLANT
PAD OB MATERNITY 4.3X12.25 (PERSONAL CARE ITEMS) ×1 IMPLANT
PAD PREP 24X48 CUFFED NSTRL (MISCELLANEOUS) ×1 IMPLANT
STRIP CLOSURE SKIN 1/2X4 (GAUZE/BANDAGES/DRESSINGS) ×1 IMPLANT
SUT MNCRL 0 VIOLET CTX 36 (SUTURE) ×2 IMPLANT
SUT MON AB 4-0 PS1 27 (SUTURE) ×1 IMPLANT
SUT MONOCRYL 0 CTX 36 (SUTURE) ×2
SUT PLAIN 2 0 XLH (SUTURE) ×1 IMPLANT
SUT VIC AB 0 CT1 36 (SUTURE) ×2 IMPLANT
SUT VIC AB 3-0 CT1 27 (SUTURE) ×1
SUT VIC AB 3-0 CT1 TAPERPNT 27 (SUTURE) ×1 IMPLANT
TOWEL OR 17X24 6PK STRL BLUE (TOWEL DISPOSABLE) ×2 IMPLANT
WATER STERILE IRR 1000ML POUR (IV SOLUTION) ×1 IMPLANT

## 2022-07-03 NOTE — Op Note (Signed)
Operative Note   SURGERY DATE: 07/03/2022  PRE-OP DIAGNOSIS:  *Pregnancy at 30/3 *HELLP syndrome *History of prior cesarean section *Fetal growth restriction *HIV positive with undetectable viral load *Fibroid uterus  POST-OP DIAGNOSIS: Same. Delivered   PROCEDURE: Repeat low transverse cesarean section via pfannenstiel skin incision with single layer uterine closure  SURGEON: Surgeon(s) and Role:    * Waldorf Bing, MD - Primary  ASSISTANT:    Nobie Putnam, Cyndi Lennert, MD - Assisting  An experienced assistant was required given the standard of surgical care given the complexity of the case.  This assistant was needed for exposure, dissection, suctioning, retraction, instrument exchange, assisting with delivery with administration of fundal pressure, and for overall help during the procedure.  ANESTHESIA: spinal  ESTIMATED BLOOD LOSS: 300 mL  DRAINS: UOP via indwelling foley  TOTAL IV FLUIDS: crystalloid  VTE PROPHYLAXIS: SCDs to bilateral lower extremities  ANTIBIOTICS: Two grams of Cefazolin were given within 1 hour of skin incision and AZT has been infusing for four hours prior to delivery  SPECIMENS: placenta to pathology  COMPLICATIONS: none  FINDINGS: Of note, there were no intra-abdominal adhesions were noted. Grossly normal uterus (3cm subserosal, anterior mid fundal fibroid), tubes and ovaries. Clear amniotic fluid, cephalic,  female infant, weight 1200gm, APGARs 8/9, intact placenta, which was "ratty" in appearance. Arterial cord gas: pH 7.24, CO2 60, Bicarb 25.7  PROCEDURE IN DETAIL: The patient was taken to the operating room where anesthesia was administered and normal fetal heart tones were confirmed. She was then prepped and draped in the normal fashion in the dorsal supine position with a leftward tilt.  After a time out was performed, a pfannensteil skin incision was made with the scalpel and carried through to the underlying layer of fascia. The  fascia was then incised at the midline and this incision was extended laterally with the mayo scissors. Attention was turned to the superior aspect of the fascial incision which was grasped with the kocher clamps x 2, tented up and the rectus muscles were dissected off with the scalpel. In a similar fashion the inferior aspect of the fascial incision was grasped with the kocher clamps, tented up and the rectus muscles dissected off with the mayo scissors. The rectus muscles were then separated in the midline and the peritoneum was entered bluntly. The bladder blade was inserted and the vesicouterine peritoneum was identified, tented up and entered with the metzenbaum scissors. This incision was extended laterally and the bladder flap was created digitally. The bladder blade was reinserted.  A low transverse hysterotomy was made with the scalpel until the endometrial cavity was breached and the amniotic sac ruptured, yielding clear amniotic fluid. This incision was extended bluntly and the infant's head, shoulders and body were delivered atraumatically. The cord was clamped x 2 and cut, and the infant was handed to the awaiting pediatricians, after delayed cord clamping was done.  The placenta was then gradually expressed from the uterus and then the uterus was exteriorized and cleared of all clots and debris with the aid of a banjo curette The hysterotomy was repaired with a running suture of 1-0 monocryl  to achieve excellent hemostasis.   The uterus and adnexa were then returned to the abdomen, and the hysterotomy and all operative sites were reinspected and excellent hemostasis was noted after irrigation and suction of the abdomen with warm saline.  The peritoneum was closed with a running stitch of 3-0 Vicryl. The fascia was reapproximated with 0 Vicryl  in a simple running fashion bilaterally. The subcutaneous layer was then reapproximated with interrupted sutures of 2-0 plain gut, and the skin was then  closed with 4-0 monocryl, in a subcuticular fashion.  The patient  tolerated the procedure well. Sponge, lap, needle, and instrument counts were correct x 2. The patient was transferred to the recovery room awake, alert and breathing independently in stable condition.  Cornelia Copa MD Attending Center for [facility])

## 2022-07-03 NOTE — Anesthesia Procedure Notes (Signed)
Spinal  Patient location during procedure: OR Start time: 07/03/2022 4:21 AM End time: 07/03/2022 4:26 AM Reason for block: surgical anesthesia Staffing Performed: anesthesiologist  Anesthesiologist: Mal Amabile, MD Performed by: Mal Amabile, MD Authorized by: Mal Amabile, MD   Preanesthetic Checklist Completed: patient identified, IV checked, site marked, risks and benefits discussed, surgical consent, monitors and equipment checked, pre-op evaluation and timeout performed Spinal Block Patient position: sitting Prep: DuraPrep and site prepped and draped Patient monitoring: heart rate, cardiac monitor, continuous pulse ox and blood pressure Approach: midline Location: L3-4 Injection technique: single-shot Needle Needle type: Pencan  Needle gauge: 24 G Needle length: 9 cm Needle insertion depth: 7 cm Assessment Sensory level: T4 Events: CSF return Additional Notes Patient tolerated procedure well. Adequate sensory level.

## 2022-07-03 NOTE — Discharge Summary (Signed)
Postpartum Discharge Summary  Date of Service updated***     Patient Name: Kimberly Bishop DOB: June 03, 1990 MRN: [id number]  Date of admission: 06/28/2022 Delivery date:07/03/2022  Delivering provider: [place]  Date of discharge: 07/03/2022  Admitting diagnosis: Fetal growth restriction antepartum [O36.5990] Intrauterine pregnancy: [redacted]w[redacted]d     Secondary diagnosis:  Principal Problem:   Fetal growth restriction antepartum Active Problems:   History of intrauterine growth restriction and stillbirth, currently pregnant   History of pre-eclampsia in prior pregnancy, currently pregnant in first trimester   HIV (human immunodeficiency virus infection)   History of cesarean delivery   Supervision of high risk pregnancy, antepartum   Hx of preeclampsia, prior pregnancy, currently pregnant, third trimester   Preeclampsia, second trimester   HELLP (hemolytic anemia/elev liver enzymes/low platelets in pregnancy)  Additional problems: ***    Discharge diagnosis: Preterm Pregnancy Delivered, Preeclampsia (severe), and HELLP                                               Post partum procedures:{Postpartum procedures:23558} Augmentation: N/A Complications: None  Hospital course: Sceduled C/S   32 y.o. yo [id number] at [redacted]w[redacted]d was admitted to the hospital 06/28/2022 for severe preeclampsia. Cesarean section scheduled with the following indication: HELLP .Delivery details are as follows:  Membrane Rupture Time/Date: 4:50 AM ,07/03/2022   Delivery Method:C-Section, Low Transverse  Details of operation can be found in separate operative note.  Patient had a postpartum course complicated by***.  She is ambulating, tolerating a regular diet, passing flatus, and urinating well. Patient is discharged home in stable condition on  07/03/22        Newborn Data: Birth date:07/03/2022  Birth time:4:50 AM  Gender:Female  Living status:Living  Apgars:8 ,9  Weight:1200 g     Magnesium Sulfate  received: Yes: Neuroprotection and seizure prophylaxis  BMZ received: Yes Rhophylac:N/A MMR:Yes ordered T-DaP: ordered postpartum  Flu: N/A Transfusion:{Transfusion received:30440034}  Physical exam  Vitals:   07/03/22 0546 07/03/22 0547 07/03/22 0550 07/03/22 0551  BP: 124/70     Pulse:  79 79 78  Resp:  Temp:  (!) 97.3 F (36.3 C)    TempSrc:  Oral    SpO2:  96% 96% 97%  Weight:      Height:       General: {Exam; general:21111117} Lochia: {Desc; appropriate/inappropriate:30686::"appropriate"} Uterine Fundus: {Desc; firm/soft:30687} Incision: {Exam; incision:21111123} DVT Evaluation: {Exam; dvt:2111122} Labs: Lab Results  Component Value Date   WBC 13.6 (H) 07/03/2022   HGB 10.8 (L) 07/03/2022   HCT 31.7 (L) 07/03/2022   MCV 86.8 07/03/2022   PLT 281 07/03/2022      Latest Ref Rng & Units 07/03/2022    2:19 AM  CMP  Glucose 70 - 99 mg/dL 92   BUN 6 - 20 mg/dL 34   Creatinine 1.47 - 1.00 mg/dL 8.29   Sodium 562 - 130 mmol/L 130   Potassium 3.5 - 5.1 mmol/L 4.7   Chloride 98 - 111 mmol/L 102   CO2 22 - 32 mmol/L 17   Calcium 8.9 - 10.3 mg/dL 8.7   Total Protein 6.5 - 8.1 g/dL 6.8   Total Bilirubin 0.3 - 1.2 mg/dL 0.5   Alkaline Phos 38 - 126 U/L 141   AST 15 - 41 U/L 138   ALT 0 - 44 U/L 126  Edinburgh Score:    04/29/2019    4:05 PM  Edinburgh Postnatal Depression Scale Screening Tool  I have been able to laugh and see the funny side of things. 0  I have looked forward with enjoyment to things. 0  I have blamed myself unnecessarily when things went wrong. 0  I have been anxious or worried for no good reason. 0  I have felt scared or panicky for no good reason. 0  Things have been getting on top of me. 0  I have been so unhappy that I have had difficulty sleeping. 0  I have felt sad or miserable. 0  I have been so unhappy that I have been crying. 0  The thought of harming myself has occurred to me. 0  Edinburgh Postnatal Depression Scale  Total 0     After visit meds:  Allergies as of 07/03/2022   No Known Allergies   Med Rec must be completed prior to using this [facility]***        Discharge home in stable condition Infant Feeding: {Baby feeding:23562} Infant Disposition:{CHL IP OB HOME WITH ZOXWRU:04540} Discharge instruction: per After Visit Summary and Postpartum booklet. Activity: Advance as tolerated. Pelvic rest for 6 weeks.  Diet: {OB JWJX:91478295} Future Appointments: Future Appointments  Date Time Provider Department Center  07/12/2022  9:55 AM Gerrit Heck, CNM [facility] None  07/19/2022  9:45 AM Blanchard Kelch, NP [facility]  07/27/2022  8:35 AM Levie Heritage, DO [facility] None  08/10/2022  8:35 AM Levie Heritage, DO [facility] None  08/17/2022  8:45 AM Kuppelweiser, Cherylann Ratel, RPH-CPP [facility]   Follow up Visit: Message sent   Please schedule this patient for a In person postpartum visit in 6 weeks with the following provider: Any provider. Additional Postpartum F/U:Incision check 1 week and BP check 1 week  High risk pregnancy complicated by: preeclampsia progressing to HELLP Delivery mode:  C-Section, Low Transverse  Anticipated Birth Control:  Nexplanon   07/03/2022 Kimberly Savage, MD

## 2022-07-03 NOTE — Progress Notes (Signed)
Date and time results received: 07/03/22 1728 (use smartphrase ".now" to insert current time)  Test: Magnesium Critical Value: 8.2 @ 0751, 7.9@ 1235 (not called until now @ 1728)  Name of Provider Notified: Dr. Donavan Foil  Orders Received? Or Actions Taken?: Actions Taken: Dr. Debroah Loop notified, as Dr. Donavan Foil going off service.

## 2022-07-03 NOTE — Anesthesia Postprocedure Evaluation (Signed)
Anesthesia Post Note  Patient: Kimberly Bishop  Procedure(s) Performed: CESAREAN SECTION     Patient location during evaluation: PACU Anesthesia Type: Spinal Level of consciousness: oriented and awake and alert Pain management: pain level controlled Vital Signs Assessment: post-procedure vital signs reviewed and stable Respiratory status: spontaneous breathing, respiratory function stable and patient connected to nasal cannula oxygen Cardiovascular status: blood pressure returned to baseline and stable Postop Assessment: no headache, no backache, no apparent nausea or vomiting, spinal receding and patient able to bend at knees Anesthetic complications: no   No notable events documented.  Last Vitals:  Vitals:   07/03/22 0642 07/03/22 0645  BP:  133/84  Pulse: 75 75  Resp: 16 11  Temp:    SpO2: 94% 93%    Last Pain:  Vitals:   07/03/22 0622  TempSrc: Oral  PainSc:    Pain Goal: Patients Stated Pain Goal: 3 (06/29/22 0803)  LLE Motor Response: Purposeful movement (07/03/22 0635) LLE Sensation: Tingling (07/03/22 1610) RLE Motor Response: Purposeful movement (07/03/22 9604) RLE Sensation: Tingling (07/03/22 5409)     Epidural/Spinal Function Cutaneous sensation: Tingles (07/03/22 8119), Patient able to flex knees: Yes (07/03/22 1478), Patient able to lift hips off bed: No (07/03/22 0635), Back pain beyond tenderness at insertion site: No (07/03/22 0635), Progressively worsening motor and/or sensory loss: No (07/03/22 2956), Bowel and/or bladder incontinence post epidural: No (07/03/22 2130)  Thursa Emme A.

## 2022-07-03 NOTE — Progress Notes (Addendum)
POSTPARTUM PROGRESS NOTE  POD #0  Subjective:  JULIYAH MERGEN is a 32 y.o. [phone] s/p repeat LTCS at [redacted]w[redacted]d.  She reports she doing well. No acute events since procedure completion. She reports she is doing well.  Denies nausea or vomiting. She has not passed flatus. Pain is well controlled.  Lochia is normal.  Objective: Blood pressure 133/81, pulse 80, temperature (!) 97.4 F (36.3 C), temperature source Oral, resp. rate (!) 7, height  (1.575 m), weight 75.4 kg, last menstrual period 12/02/2021, SpO2 96 %, unknown if currently breastfeeding.  Physical Exam:  General: alert, cooperative and no distress Chest: no respiratory distress Heart:regular rate, distal pulses intact Abdomen: soft, nontender, appropriately tender Uterine Fundus: firm, appropriately tender DVT Evaluation: No calf swelling or tenderness Extremities: trace edema, SCDs in place Skin: warm, dry; incision clean/dry/intact w/ honeycomb dressing in place  Recent Labs    07/03/22 0219 07/03/22 0751  HGB 10.8* 10.9*  HCT 31.7* 33.7*       Latest Ref Rng & Units 07/03/2022    7:51 AM 07/03/2022    2:19 AM 07/02/2022    9:08 PM  CMP  Glucose 70 - 99 mg/dL 696  92  90   BUN 6 - 20 mg/dL 33  34  35   Creatinine 0.44 - 1.00 mg/dL 2.95  2.84  1.32   Sodium 135 - 145 mmol/L 129  130  133   Potassium 3.5 - 5.1 mmol/L 5.4  4.7  5.2   Chloride 98 - 111 mmol/L 99  102  102   CO2 22 - 32 mmol/L Calcium 8.9 - 10.3 mg/dL 8.0  8.7  9.4   Total Protein 6.5 - 8.1 g/dL 7.0  6.8  7.0   Total Bilirubin 0.3 - 1.2 mg/dL 0.3  0.5  0.5   Alkaline Phos 38 - 126 U/L 145  141  140   AST 15 - 41 U/L 162  138  76   ALT 0 - 44 U/L 133  126  84      Assessment/Plan: ANDRA HESLIN is a 32 y.o. [id number] s/p repeat LTCS at [redacted]w[redacted]d for preeclampsia with severe features/HELLP.  POD#0 -  Contraception: nexplanon Continue routine postpartum care Mag level is therapeutic but somewhat high, will recheck level in  2-3 hours. Decrease mag sulfate infusion to 1 gram/hr Serial CMP to track LFT and creatinine   LOS: 5 days   Mariel Aloe, Md Faculty Attending, [facility] 07/03/2022, 10:56 AM

## 2022-07-03 NOTE — Progress Notes (Signed)
Date and time results received: 07/03/22 1750 (use smartphrase ".now" to insert current time)  Test: Magnesium  Critical Value: 7.3  Name of Provider Notified: Dr. Debroah Loop  Orders Received? Or Actions Taken?: Orders Received - See Orders for details

## 2022-07-03 NOTE — Transfer of Care (Signed)
Immediate Anesthesia Transfer of Care Note  Patient: Kimberly Bishop  Procedure(s) Performed: CESAREAN SECTION  Patient Location: PACU  Anesthesia Type:Spinal  Level of Consciousness: awake, alert , and oriented  Airway & Oxygen Therapy: Patient Spontanous Breathing  Post-op Assessment: Report given to RN and Post -op Vital signs reviewed and stable  Post vital signs: Reviewed and stable  Last Vitals:  Vitals Value Taken Time  BP 124/70 07/03/22 0545  Temp    Pulse 77 07/03/22 0547  Resp 16 07/03/22 0547  SpO2 96 % 07/03/22 0547  Vitals shown include unvalidated device data.  Last Pain:  Vitals:   07/02/22 2207  TempSrc: Oral  PainSc:       Patients Stated Pain Goal: 3 (06/29/22 0803)  Complications: No notable events documented.

## 2022-07-04 ENCOUNTER — Encounter (HOSPITAL_COMMUNITY): Payer: Self-pay | Admitting: Obstetrics and Gynecology

## 2022-07-04 LAB — COMPREHENSIVE METABOLIC PANEL
ALT: 117 U/L — ABNORMAL HIGH (ref 0–44)
ALT: 113 U/L — ABNORMAL HIGH (ref 0–44)
AST: 107 U/L — ABNORMAL HIGH (ref 15–41)
AST: 94 U/L — ABNORMAL HIGH (ref 15–41)
Albumin: 2.6 g/dL — ABNORMAL LOW (ref 3.5–5.0)
Albumin: 2.6 g/dL — ABNORMAL LOW (ref 3.5–5.0)
Alkaline Phosphatase: 131 U/L — ABNORMAL HIGH (ref 38–126)
Alkaline Phosphatase: 122 U/L (ref 38–126)
Anion gap: 10 (ref 5–15)
Anion gap: 11 (ref 5–15)
BUN: 29 mg/dL — ABNORMAL HIGH (ref 6–20)
BUN: 30 mg/dL — ABNORMAL HIGH (ref 6–20)
CO2: 20 mmol/L — ABNORMAL LOW (ref 22–32)
CO2: 22 mmol/L (ref 22–32)
Calcium: 7.2 mg/dL — ABNORMAL LOW (ref 8.9–10.3)
Calcium: 7.4 mg/dL — ABNORMAL LOW (ref 8.9–10.3)
Chloride: 97 mmol/L — ABNORMAL LOW (ref 98–111)
Chloride: 98 mmol/L (ref 98–111)
Creatinine, Ser: 1.27 mg/dL — ABNORMAL HIGH (ref 0.44–1.00)
Creatinine, Ser: 1.1 mg/dL — ABNORMAL HIGH (ref 0.44–1.00)
GFR, Estimated: 58 mL/min — ABNORMAL LOW (ref >60)
GFR, Estimated: >60 mL/min (ref >60)
Glucose, Bld: 123 mg/dL — ABNORMAL HIGH (ref 70–99)
Glucose, Bld: 115 mg/dL — ABNORMAL HIGH (ref 70–99)
Potassium: 4.9 mmol/L (ref 3.5–5.1)
Potassium: 4.7 mmol/L (ref 3.5–5.1)
Sodium: 127 mmol/L — ABNORMAL LOW (ref 135–145)
Sodium: 131 mmol/L — ABNORMAL LOW (ref 135–145)
Total Bilirubin: 0.3 mg/dL (ref 0.3–1.2)
Total Bilirubin: 0.3 mg/dL (ref 0.3–1.2)
Total Protein: 6.8 g/dL (ref 6.5–8.1)
Total Protein: 6.6 g/dL (ref 6.5–8.1)

## 2022-07-04 LAB — MAGNESIUM: Magnesium: 6.7 mg/dL (ref 1.7–2.4)

## 2022-07-04 LAB — CBC
HCT: 32.1 % — ABNORMAL LOW (ref 36.0–46.0)
Hemoglobin: 10.6 g/dL — ABNORMAL LOW (ref 12.0–15.0)
MCH: 29 pg (ref 26.0–34.0)
MCHC: 33 g/dL (ref 30.0–36.0)
MCV: 87.9 fL (ref 80.0–100.0)
Platelets: 242 10*3/uL (ref 150–400)
RBC: 3.65 MIL/uL — ABNORMAL LOW (ref 3.87–5.11)
RDW: 14 % (ref 11.5–15.5)
WBC: 16.7 10*3/uL — ABNORMAL HIGH (ref 4.0–10.5)
nRBC: 0.4 % — ABNORMAL HIGH (ref 0.0–0.2)

## 2022-07-04 MED ORDER — SODIUM CHLORIDE 0.9 % IV SOLN: INTRAVENOUS | Status: AC

## 2022-07-04 MED ORDER — SODIUM CHLORIDE 0.9 % IV SOLN: INTRAVENOUS | Status: DC

## 2022-07-04 NOTE — Progress Notes (Signed)
Postoperative Day 1: Cesarean Delivery at [redacted]w[redacted]d due to [facility] with superimposed severe preeclampsia, FGR  Subjective:  Patient reports incisional pain, tolerating PO, + flatus, and no problems voiding.  Patient denies any headaches, visual symptoms, RUQ/epigastric pain or other concerning symptoms.  Baby is doing well in NICU. Breastfeeding.  Objective: Patient Vitals for the past 24 hrs:  BP Temp Temp src Pulse Resp SpO2  07/04/22 0848 138/74 98.2 F (36.8 C) Oral 90 18 98 %  07/04/22 0544 (!) 142/82 -- -- 84 -- 98 %  07/04/22 0539 -- -- -- -- -- 97 %  07/04/22 0534 -- -- -- -- -- 97 %  07/04/22 0529 -- -- -- -- -- 98 %  07/04/22 0524 -- -- -- -- -- 99 %  07/04/22 0520 (!) 172/84 -- -- 83 -- --  07/04/22 0500 -- -- -- -- 17 --  07/04/22 0345 (!) 144/81 97.8 F (36.6 C) Oral 83 17 98 %  07/04/22 0253 -- -- -- -- 16 --  07/04/22 0150 -- -- -- -- 18 --  07/04/22 0100 -- -- -- -- 18 --  07/04/22 0008 137/77 97.6 F (36.4 C) Oral 84 18 99 %  07/03/22 2300 -- -- -- -- 18 --  07/03/22 2145 -- -- -- -- 18 --  07/03/22 1956 (!) 147/83 98.6 F (37 C) Oral 84 17 98 %  07/03/22 1655 128/79 98 F (36.7 C) Oral 70 17 99 %  07/03/22 1615 -- -- -- -- 18 99 %  07/03/22 1510 -- -- -- -- 16 --  07/03/22 1414 -- -- -- -- 17 --  07/03/22 1308 126/80 -- -- 71 16 99 %  07/03/22 1139 (!) 134/91 97.8 F (36.6 C) Oral 76 18 98 %   I/O last 3 completed shifts: In: 3671.2 [P.O.:1620; I.V.:2051.2] Out: 8165 [Urine:8000; Blood:165]  Physical Exam:  General: alert, cooperative, and no distress Lochia: appropriate Uterine Fundus: firm, nontender Incision: healing well, no significant drainage, dressing C/D/I DVT Evaluation: No evidence of DVT seen on physical exam. Negative Homan's sign. No cords or calf tenderness. No significant calf/ankle edema.  Labs:    Latest Ref Rng & Units 07/04/2022   12:25 AM 07/03/2022    4:02 PM 07/03/2022    7:51 AM  CBC  WBC 4.0 - 10.5 K/uL 16.7  18.1  17.1    Hemoglobin 12.0 - 15.0 g/dL 16.1  09.6  04.5   Hematocrit 36.0 - 46.0 % 32.1  31.9  33.7   Platelets 150 - 400 K/uL 242  275  279       Latest Ref Rng & Units 07/04/2022    5:11 AM 07/04/2022   12:25 AM 07/03/2022    4:02 PM  CMP  Glucose 70 - 99 mg/dL 409  811  914   BUN 6 - 20 mg/dL Creatinine 0.44 - 1.00 mg/dL 7.82  9.56  2.13   Sodium 135 - 145 mmol/L 131  127  128   Potassium 3.5 - 5.1 mmol/L 4.7  4.9  5.7   Chloride 98 - 111 mmol/L 98  97  98   CO2 22 - 32 mmol/L Calcium 8.9 - 10.3 mg/dL 7.4  7.2  7.6   Total Protein 6.5 - 8.1 g/dL 6.6  6.8  7.2   Total Bilirubin 0.3 - 1.2 mg/dL 0.3  0.3  0.6   Alkaline Phos 38 - 126 U/L 122  131  140   AST 15 - 41 U/L 94  107  126   ALT 0 - 44 U/L 113  117  126     Assessment/Plan: Status post Cesarean section. Doing well postoperatively.  - Stable BP. Continue Nifedipine XR 30 mg daily, Lasix 20 mg po bid x 5 day course. Labs improving. Stable UOP. - Continue analgesia, OOB, Lovenox for VTE prophylaxis. - Planning on Nexplanon prior to discharge - Routine postpartum care.  Jaynie Collins, MD 07/04/2022, 11:10 AM

## 2022-07-04 NOTE — Lactation Note (Signed)
This note was copied from a baby's chart. Lactation Consultation Note  Patient Name: Kimberly Bishop Today's Date: 07/04/2022 Age:32 hours   LC spoke with Mom of baby in the NICU and Mom states she does not wish to pump or provide breast milk.  LC services not needed at present.   Consult Status Consult Status: Complete    [name] 07/04/2022, 9:34 AM

## 2022-07-04 NOTE — Progress Notes (Signed)
Date and time results received: 07/04/22  0120 (use smartphrase ".now" to insert current time)  Test: magnesium  Critical Value: 6.7  Name of Provider Notified: Dr Vergie Living   Orders Received? Or Actions Taken?: Therapeutic

## 2022-07-04 NOTE — Lactation Note (Signed)
This note was copied from a baby's chart. Lactation Consultation Note  Patient Name: Kimberly Bishop Today's Date: 07/04/2022 Age:32 hours   RN called and stated that Mom did want to pump to provide breast milk for baby.   LC in to visit with P2 Mom and Mom seemed surprised to see LC.  LC shared that she had been told that Mom was interested in pumping to provide her breast milk to baby.  Mom's response was with hesitation "we can try".  LC started to educated Mom on the importance of frequent and consistent double pumping every 3 hrs.  Mom decided that baby should get donor milk and then formula.    LC left handout information on the pumping process for Mom to browse through.  LC let Mom know that if she changed her mind, to ask her nurse to set up DEBP and notify LC of her change.     [name] 07/04/2022, 3:31 PM

## 2022-07-04 NOTE — Progress Notes (Signed)
CSW looked for MOB at bedside to complete psychosocial assessment; however, MOB not present. CSW will check back at a later time.  Madilynn Montante, LCSW Clinical Social Worker [facility] Cell#: [phone]  

## 2022-07-04 NOTE — Plan of Care (Signed)
  Problem: Health Behavior/Discharge Planning: Goal: Ability to manage health-related needs will improve Outcome: Progressing   Problem: Clinical Measurements: Goal: Ability to maintain clinical measurements within normal limits will improve Outcome: Progressing Goal: Will remain free from infection Outcome: Progressing Goal: Diagnostic test results will improve Outcome: Progressing Goal: Respiratory complications will improve Outcome: Progressing Goal: Cardiovascular complication will be avoided Outcome: Progressing   Problem: Activity: Goal: Risk for activity intolerance will decrease Outcome: Progressing   Problem: Nutrition: Goal: Adequate nutrition will be maintained Outcome: Progressing   Problem: Coping: Goal: Level of anxiety will decrease Outcome: Progressing   Problem: Elimination: Goal: Will not experience complications related to bowel motility Outcome: Progressing Goal: Will not experience complications related to urinary retention Outcome: Progressing   Problem: Pain Managment: Goal: General experience of comfort will improve Outcome: Progressing   Problem: Safety: Goal: Ability to remain free from injury will improve Outcome: Progressing   Problem: Skin Integrity: Goal: Risk for impaired skin integrity will decrease Outcome: Progressing   Problem: Education: Goal: Knowledge of disease or condition will improve Outcome: Progressing Goal: Knowledge of the prescribed therapeutic regimen will improve Outcome: Progressing Goal: Individualized Educational Video(s) Outcome: Progressing   Problem: Clinical Measurements: Goal: Complications related to the disease process, condition or treatment will be avoided or minimized Outcome: Progressing   Problem: Education: Goal: Knowledge of disease or condition will improve Outcome: Progressing Goal: Knowledge of the prescribed therapeutic regimen will improve Outcome: Progressing   Problem: Fluid  Volume: Goal: Peripheral tissue perfusion will improve Outcome: Progressing   Problem: Clinical Measurements: Goal: Complications related to disease process, condition or treatment will be avoided or minimized Outcome: Progressing   Problem: Education: Goal: Knowledge of the prescribed therapeutic regimen will improve Outcome: Progressing Goal: Understanding of sexual limitations or changes related to disease process or condition will improve Outcome: Progressing Goal: Individualized Educational Video(s) Outcome: Progressing   Problem: Self-Concept: Goal: Communication of feelings regarding changes in body function or appearance will improve Outcome: Progressing   Problem: Skin Integrity: Goal: Demonstration of wound healing without infection will improve Outcome: Progressing   Problem: Education: Goal: Knowledge of condition will improve Outcome: Progressing Goal: Individualized Educational Video(s) Outcome: Progressing Goal: Individualized Newborn Educational Video(s) Outcome: Progressing   Problem: Activity: Goal: Will verbalize the importance of balancing activity with adequate rest periods Outcome: Progressing Goal: Ability to tolerate increased activity will improve Outcome: Progressing   Problem: Coping: Goal: Ability to identify and utilize available resources and services will improve Outcome: Progressing   Problem: Life Cycle: Goal: Chance of risk for complications during the postpartum period will decrease Outcome: Progressing   Problem: Role Relationship: Goal: Ability to demonstrate positive interaction with newborn will improve Outcome: Progressing   Problem: Skin Integrity: Goal: Demonstration of wound healing without infection will improve Outcome: Progressing

## 2022-07-04 NOTE — Clinical Social Work Maternal (Signed)
CLINICAL SOCIAL WORK MATERNAL/CHILD NOTE  Patient Details  Name: Kimberly Bishop MRN: [id number] Date of Birth: 12/24/1990  Date:  11-03-22  Clinical Social Worker Initiating Note:  [place], [place] Date/Time: Initiated:  07/04/22/1402     Child's Name:  Kimberly Bishop.   Biological Parents:  Mother, Father (Father: Kimberly Bishop)   Need for Interpreter:  None   Reason for Referral:  Parental Support of Premature Babies < 32 weeks/or Critically Ill babies, Other (Comment) (HIV Exposed Newborn)   Address:  [address] [place] [address]    Phone number:  [phone] (home)     Additional phone number:   Household Members/Support Persons (HM/SP):   Household Member/Support Person 1, Household Member/Support Person 2   HM/SP Name Relationship DOB or Age  HM/SP -1 Kimberly Bishop FOB/Husband    HM/SP -2 Kimberly Bishop daughter 03/29/19  HM/SP -3        HM/SP -4        HM/SP -5        HM/SP -6        HM/SP -7        HM/SP -8          Natural Supports (not living in the home):  Immediate Family   Professional Supports: None   Employment: Full-time   Type of Work: Development worker, international aid:  High school graduate   Homebound arranged:    Surveyor, quantity Resources:  [employer]   Other Resources:  [employer], Sales executive     Cultural/Religious Considerations Which May Impact Care:    Strengths:  Ability to meet basic needs  , Understanding of illness   Psychotropic Medications:         Pediatrician:       Pediatrician List:   Radiographer, therapeutic    [place]    [facility]      Pediatrician Fax Number:    Risk Factors/Current Problems:  None   Cognitive State:      Mood/Affect:  Calm  , Happy  , Interested  , Comfortable  , Relaxed     CSW Assessment: CSW met with MOB at infant's bedside to complete psychosocial assessment. CSW introduced self and explained  role. MOB was welcoming, pleasant, and remained engaged during assessment. MOB reported that she resides with FOB/Husband and daughter. MOB reported that she receives both [facility] and food stamps. 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 MOB reported that they have a bassinet for infant but have not started to shop for infant as infant arrived early. CSW informed MOB about [employer]'s [employer] if any assistance is needed obtaining items for infant. MOB reported that a referral for all essential items would be helpful, CSW agreed to complete referral. CSW inquired about MOB's support system, MOB reported that her mom, husband/FOB, and husband's mom are supports.   CSW provided review of Sudden Infant Death Syndrome (SIDS) precautions.    CSW and MOB discussed infant's NICU admission. CSW informed MOB about the NICU, what to expect, and resources/supports available while infant is admitted to the NICU. MOB reported that the NICU staff has been great and she feels well informed about infant's care. MOB denied any transportation barriers with visiting infant in the NICU. MOB denied any questions/concerns regarding the NICU.   RN left the room after caring for  infant. CSW closed the door.   CSW informed MOB that infant qualifies to apply for SSI benefits, MOB reported that she was interested. CSW informed MOB about SSI benefits and application process, MOB denied any questions.   CSW inquired about MOB's mental health history. MOB denied any mental health history and denied any history of postpartum depression. CSW inquired about how MOB was feeling emotionally since giving birth, MOB reported that she was feeling pretty good. MOB presented calm and did not demonstrate any acute mental health signs/symptoms. CSW assessed for safety, MOB denied SI, HI, and domestic violence.   CSW provided education regarding the baby blues period vs. perinatal mood  disorders, discussed treatment and gave resources for mental health follow up if concerns arise.  CSW recommends self-evaluation during the postpartum time period using the New Mom Checklist from Postpartum Progress and encouraged MOB to contact a medical professional if symptoms are noted at any time.    CSW acknowledged that infant will need pediatric infectious disease clinic follow up. MOB reported that she is interested in [facility]. CSW explained that a referral will be made for infant prior to discharge. MOB verbalized understanding and reported that she followed up with this clinic for her daughter as well. CSW inquired about how MOB is coping with her diagnosis. MOB reported that she is coping great and shared that she is on injections and everything is stable. MOB reported that FOB is aware of diagnosis.  CSW inquired about any additional needs, MOB reported none.   CSW completed FSN referral for requested items.   CSW identifies no further need for intervention and no barriers to discharge at this time. MOB opted to call CSW if any needs/concerns arise versus CSW checking in weekly.    CSW Plan/Description:  Sudden Infant Death Syndrome (SIDS) Education, Perinatal Mood and Anxiety Disorder (PMADs) Education, Supplemental Security Income (SSI) Information, Other Information/Referral to Community Resources, Psychosocial Support and Ongoing Assessment of Needs    Martyna Thorns L Deliliah Spranger, LCSW 07/04/2022, 2:05 PM 

## 2022-07-05 LAB — CBC
HCT: 32 % — ABNORMAL LOW (ref 36.0–46.0)
Hemoglobin: 10.3 g/dL — ABNORMAL LOW (ref 12.0–15.0)
MCH: 28.9 pg (ref 26.0–34.0)
MCHC: 32.2 g/dL (ref 30.0–36.0)
MCV: 89.6 fL (ref 80.0–100.0)
Platelets: 248 10*3/uL (ref 150–400)
RBC: 3.57 MIL/uL — ABNORMAL LOW (ref 3.87–5.11)
RDW: 14.3 % (ref 11.5–15.5)
WBC: 13.4 10*3/uL — ABNORMAL HIGH (ref 4.0–10.5)
nRBC: 0.3 % — ABNORMAL HIGH (ref 0.0–0.2)

## 2022-07-05 LAB — COMPREHENSIVE METABOLIC PANEL
ALT: 130 U/L — ABNORMAL HIGH (ref 0–44)
AST: 104 U/L — ABNORMAL HIGH (ref 15–41)
Albumin: 2.7 g/dL — ABNORMAL LOW (ref 3.5–5.0)
Alkaline Phosphatase: 154 U/L — ABNORMAL HIGH (ref 38–126)
Anion gap: 8 (ref 5–15)
BUN: 23 mg/dL — ABNORMAL HIGH (ref 6–20)
CO2: 24 mmol/L (ref 22–32)
Calcium: 8.1 mg/dL — ABNORMAL LOW (ref 8.9–10.3)
Chloride: 101 mmol/L (ref 98–111)
Creatinine, Ser: 1.03 mg/dL — ABNORMAL HIGH (ref 0.44–1.00)
GFR, Estimated: >60 mL/min (ref >60)
Glucose, Bld: 94 mg/dL (ref 70–99)
Potassium: 4.7 mmol/L (ref 3.5–5.1)
Sodium: 133 mmol/L — ABNORMAL LOW (ref 135–145)
Total Bilirubin: 0.7 mg/dL (ref 0.3–1.2)
Total Protein: 6.8 g/dL (ref 6.5–8.1)

## 2022-07-05 LAB — SURGICAL PATHOLOGY

## 2022-07-05 LAB — TYPE AND SCREEN
ABO/RH(D): O POS
Antibody Screen: NEGATIVE

## 2022-07-05 MED ORDER — NIFEDIPINE ER OSMOTIC RELEASE 30 MG PO TB24
30.0000 mg | ORAL_TABLET | Freq: Two times a day (BID) | ORAL | Status: DC
  Administered 2022-07-05 – 2022-07-06 (×2): 30 mg via ORAL
  Filled 2022-07-05 (×2): qty 1

## 2022-07-05 MED ORDER — LIDOCAINE HCL 1 % IJ SOLN: 0.0000 mL | Freq: Once | INTRAMUSCULAR | Status: DC | PRN

## 2022-07-05 MED ORDER — ETONOGESTREL 68 MG ~~LOC~~ IMPL
68.0000 mg | DRUG_IMPLANT | Freq: Once | SUBCUTANEOUS | Status: AC
  Administered 2022-07-06: 68 mg via SUBCUTANEOUS
  Filled 2022-07-05 (×2): qty 1

## 2022-07-05 NOTE — Progress Notes (Signed)
Postoperative Day 2: Cesarean Delivery at [redacted]w[redacted]d due to [facility] with superimposed severe preeclampsia and HELLP, FGR  Subjective: +flatus and meeting all other postop goals. No s/s of pre-eclampsia. Baby is doing well in NICU. Breastfeeding.  Objective: Patient Vitals for the past 24 hrs:  BP Temp Temp src Pulse Resp SpO2  07/05/22 0658 136/78 97.9 F (36.6 C) Oral 94 18 --  07/05/22 0232 (!) 144/79 -- -- 92 -- --  07/04/22 2014 135/70 98.3 F (36.8 C) Oral (!) 102 17 96 %  07/04/22 1530 132/79 99 F (37.2 C) Oral 89 18 98 %  07/04/22 0848 138/74 98.2 F (36.8 C) Oral 90 18 98 %    I/O last 3 completed shifts: In: 1358.2 [P.O.:810; I.V.:548.2] Out: 5400 [Urine:5400]  Physical Exam:  General: alert, cooperative, and no distress CV: normal s1 and s2, no MRGs Pulm: CTAB Uterine Fundus: firm, nontender Incision: healing well, no significant drainage, dressing C/D/I  Meds: Current Facility-Administered Medications  Medication Dose Route Frequency Provider Last Rate Last Admin   coconut oil  1 Application Topical PRN [place] Bing, MD       witch hazel-glycerin (TUCKS) pad 1 Application  1 Application Topical PRN [place] Bing, MD       And   dibucaine (NUPERCAINAL) 1 % rectal ointment 1 Application  1 Application Rectal PRN [place] Bing, MD       diphenhydrAMINE (BENADRYL) capsule 25 mg  25 mg Oral Q6H PRN [place] Bing, MD       enoxaparin (LOVENOX) injection 40 mg  40 mg Subcutaneous Q24H [place] Fillers, MD   40 mg at 07/04/22 0851   furosemide (LASIX) tablet 20 mg  20 mg Oral BID [place] Bing, MD   20 mg at 07/04/22 1740   gabapentin (NEURONTIN) capsule 200 mg  200 mg Oral BID [place] Bing, MD   200 mg at 07/04/22 2145   hydrALAZINE (APRESOLINE) injection 5 mg  5 mg Intravenous PRN [place] Bing, MD   5 mg at 07/01/22 1610   And   hydrALAZINE (APRESOLINE) injection 10 mg  10 mg Intravenous PRN [place] Bing, MD   10 mg at 06/28/22 1153   And    labetalol (NORMODYNE) injection 20 mg  20 mg Intravenous PRN [place] Bing, MD       And   labetalol (NORMODYNE) injection 40 mg  40 mg Intravenous PRN [place] Bing, MD       labetalol (NORMODYNE) injection 80 mg  80 mg Intravenous PRN [place] Bing, MD       menthol-cetylpyridinium (CEPACOL) lozenge 3 mg  1 lozenge Oral Q2H PRN [place] Bing, MD       NIFEdipine (PROCARDIA-XL/NIFEDICAL-XL) 24 hr tablet 30 mg  30 mg Oral Daily [place] Bing, MD   30 mg at 07/04/22 0946   oxyCODONE (Oxy IR/ROXICODONE) immediate release tablet 5-10 mg  5-10 mg Oral Q4H PRN [place] Bing, MD   5 mg at 07/05/22 0220   polyethylene glycol (MIRALAX / GLYCOLAX) packet 17 g  17 g Oral Daily [place] Bing, MD   17 g at 07/04/22 0946   prenatal multivitamin tablet 1 tablet  1 tablet Oral Q1200 [place] Bing, MD   1 tablet at 07/04/22 0946   senna-docusate (Senokot-S) tablet 2 tablet  2 tablet Oral Daily [place] Bing, MD   2 tablet at 07/04/22 0946   simethicone (MYLICON) chewable tablet 80 mg  80 mg Oral TID Ilean Skill, MD   80 mg at 07/04/22 1740  Labs:    Latest Ref Rng & Units 07/05/2022    5:01 AM 07/04/2022   12:25 AM 07/03/2022    4:02 PM  CBC  WBC 4.0 - 10.5 K/uL 13.4  16.7  18.1   Hemoglobin 12.0 - 15.0 g/dL 16.1  09.6  04.5   Hematocrit 36.0 - 46.0 % 32.0  32.1  31.9   Platelets 150 - 400 K/uL 248  242  275       Latest Ref Rng & Units 07/05/2022    5:01 AM 07/04/2022    5:11 AM 07/04/2022   12:25 AM  CMP  Glucose 70 - 99 mg/dL 94  409  811   BUN 6 - 20 mg/dL Creatinine 0.44 - 1.00 mg/dL 9.14  7.82  9.56   Sodium 135 - 145 mmol/L 133  131  127   Potassium 3.5 - 5.1 mmol/L 4.7  4.7  4.9   Chloride 98 - 111 mmol/L 101  98  97   CO2 22 - 32 mmol/L Calcium 8.9 - 10.3 mg/dL 8.1  7.4  7.2   Total Protein 6.5 - 8.1 g/dL 6.8  6.6  6.8   Total Bilirubin 0.3 - 1.2 mg/dL 0.7  0.3  0.3   Alkaline Phos 38 - 126 U/L 154  122  131   AST 15 - 41 U/L  104  94  107   ALT 0 - 44 U/L 130  113  117    Assessment/Plan: Status post Cesarean section. Doing well postoperatively.  *Postpartum/Postop: desires nexplanon. O POS.  *Severe pre-eclampsia: BPs improving. Continue daily labs. Watch LFTs. Pt already off motrin and apap *HIV: continue current regimen; ID aware. Pt has 5/7 follow up *PPx: OOB, Lovenox. *Discharge: likely tomorrow. Pt has 4/30 follow up visit at [place].   [place] Bing, MD 07/05/2022, 7:38 AM

## 2022-07-06 ENCOUNTER — Other Ambulatory Visit (HOSPITAL_COMMUNITY): Payer: Self-pay

## 2022-07-06 ENCOUNTER — Encounter: Payer: Self-pay | Admitting: Obstetrics & Gynecology

## 2022-07-06 DIAGNOSIS — Z30017 Encounter for initial prescription of implantable subdermal contraceptive: Secondary | ICD-10-CM

## 2022-07-06 DIAGNOSIS — Z975 Presence of (intrauterine) contraceptive device: Secondary | ICD-10-CM

## 2022-07-06 LAB — COMPREHENSIVE METABOLIC PANEL
ALT: 132 U/L — ABNORMAL HIGH (ref 0–44)
AST: 87 U/L — ABNORMAL HIGH (ref 15–41)
Albumin: 2.7 g/dL — ABNORMAL LOW (ref 3.5–5.0)
Alkaline Phosphatase: 134 U/L — ABNORMAL HIGH (ref 38–126)
Anion gap: 12 (ref 5–15)
BUN: 22 mg/dL — ABNORMAL HIGH (ref 6–20)
CO2: 23 mmol/L (ref 22–32)
Calcium: 9 mg/dL (ref 8.9–10.3)
Chloride: 100 mmol/L (ref 98–111)
Creatinine, Ser: 1.03 mg/dL — ABNORMAL HIGH (ref 0.44–1.00)
GFR, Estimated: >60 mL/min (ref >60)
Glucose, Bld: 101 mg/dL — ABNORMAL HIGH (ref 70–99)
Potassium: 4.4 mmol/L (ref 3.5–5.1)
Sodium: 135 mmol/L (ref 135–145)
Total Bilirubin: 0.4 mg/dL (ref 0.3–1.2)
Total Protein: 6.4 g/dL — ABNORMAL LOW (ref 6.5–8.1)

## 2022-07-06 MED ORDER — OXYCODONE HCL 5 MG PO TABS
5.0000 mg | ORAL_TABLET | ORAL | 0 refills | Status: DC | PRN
  Filled 2022-07-06: qty 30, 5d supply, fill #0

## 2022-07-06 MED ORDER — IBUPROFEN 600 MG PO TABS
600.0000 mg | ORAL_TABLET | Freq: Four times a day (QID) | ORAL | 2 refills | Status: DC | PRN
  Filled 2022-07-06: qty 30, 8d supply, fill #0

## 2022-07-06 MED ORDER — SENNOSIDES-DOCUSATE SODIUM 8.6-50 MG PO TABS
2.0000 | ORAL_TABLET | Freq: Two times a day (BID) | ORAL | 0 refills | Status: DC | PRN
  Filled 2022-07-06: qty 30, 8d supply, fill #0

## 2022-07-06 MED ORDER — LIDOCAINE HCL 1 % IJ SOLN
INTRAMUSCULAR | Status: AC
  Administered 2022-07-06: 20 mL
  Filled 2022-07-06: qty 20

## 2022-07-06 MED ORDER — FUROSEMIDE 20 MG PO TABS
20.0000 mg | ORAL_TABLET | Freq: Two times a day (BID) | ORAL | 0 refills | Status: DC
  Filled 2022-07-06: qty 8, 4d supply, fill #0

## 2022-07-06 MED ORDER — NIFEDIPINE ER 30 MG PO TB24
30.0000 mg | ORAL_TABLET | Freq: Two times a day (BID) | ORAL | 1 refills | Status: AC
  Filled 2022-07-06: qty 60, 30d supply, fill #0

## 2022-07-06 NOTE — Progress Notes (Signed)
Discharge instructions and prescriptions given to pt. Discussed post c-section care, signs and symptoms to report to the MD, upcoming appointments, and meds. Pt verbalizes understanding and has no questions or concerns at this time. Pt discharged home from hospital in stable condition. 

## 2022-07-06 NOTE — Procedures (Signed)
Post-Placental Nexplanon Insertion Procedure Note  Patient was identified. Informed consent was signed, signed copy in chart. A time-out was performed.    The insertion site was identified 8-10 cm (3-4 inches) from the medial epicondyle of the humerus and 3-5 cm (1.25-2 inches) posterior to (below) the sulcus (groove) between the biceps and triceps muscles of the patient's left arm and marked. The site was prepped and draped in the usual sterile fashion. Pt was prepped with alcohol swab and then injected with 3 cc of 1% lidocaine. The site was prepped with betadine. Nexplanon removed form packaging,  Device confirmed in needle, then inserted full length of needle and withdrawn per handbook instructions. Provider and patient verified presence of the implant in the woman's arm by palpation. Pt insertion site was covered with steristrips/adhesive bandage and pressure bandage. There was minimal blood loss. Patient tolerated procedure well.  Patient was given post procedure instructions and Nexplanon user card with expiration date. Patient was asked to keep the pressure dressing on for 24 hours to minimize bruising and keep the adhesive bandage on for one day. The patient verbalized understanding of the plan of care and agrees.   Lot # [id number] Expiration Date 14.7829   Jaynie Collins, MD, FACOG Obstetrician & Gynecologist, [facility] for [employer], [facility]

## 2022-07-12 ENCOUNTER — Ambulatory Visit (INDEPENDENT_AMBULATORY_CARE_PROVIDER_SITE_OTHER): Payer: Medicaid Other

## 2022-07-12 VITALS — BP 123/79 | HR 103 | Wt 150.0 lb

## 2022-07-12 DIAGNOSIS — O34211 Maternal care for low transverse scar from previous cesarean delivery: Secondary | ICD-10-CM

## 2022-07-12 DIAGNOSIS — Z0130 Encounter for examination of blood pressure without abnormal findings: Secondary | ICD-10-CM

## 2022-07-12 NOTE — Progress Notes (Signed)
   POSTOPERATIVE VISIT NOTE   Subjective:     Kimberly Bishop is a 32 y.o. [phone] who presents for incision check s/p *** cesaren section. Patient reports she is eating a regular diet {with-without:5700} difficulty. Bowel movements are {normal/abnormal***:19619}. {pain control:13522::"The patient is not having any pain."}  {Common ambulatory SmartLinks:19316}. Last pap on *** was negative, **negative HPV.  Review of Systems {ros; complete:30496}    Objective:    BP 123/79   Pulse (!) 103   Wt 150 lb (68 kg)   LMP 12/02/2021 (Exact Date)   Breastfeeding No   BMI 27.44 kg/m  General:  alert, cooperative, and no distress  Abdomen: soft, bowel sounds active, non-tender  Incision:   Steri strips removed without issues. Healing well, no drainage, no erythema, no hernia, no swelling, no dehiscence, incision well approximated.     Pathology Results: ***   Assessment:   {doing well:13525::"Doing well postoperatively."} Operative findings again reviewed. Pathology report discussed.    Plan:   S/P *** Cesarean Section Postpartum State Appropriate Involution ***Feeding  -*** -*** -***   Gerrit Heck, MSN, CNM Advanced Practice Provider, Center for [facility]

## 2022-07-14 ENCOUNTER — Ambulatory Visit: Payer: Medicaid Other | Admitting: Obstetrics and Gynecology

## 2022-07-14 ENCOUNTER — Other Ambulatory Visit (HOSPITAL_COMMUNITY): Payer: Self-pay

## 2022-07-19 ENCOUNTER — Ambulatory Visit (INDEPENDENT_AMBULATORY_CARE_PROVIDER_SITE_OTHER): Payer: Medicaid Other | Admitting: Infectious Diseases

## 2022-07-19 ENCOUNTER — Other Ambulatory Visit: Payer: Self-pay

## 2022-07-19 ENCOUNTER — Telehealth: Payer: Self-pay

## 2022-07-19 ENCOUNTER — Encounter: Payer: Self-pay | Admitting: Infectious Diseases

## 2022-07-19 VITALS — BP 123/77 | HR 98 | Temp 98.3°F | Wt 148.0 lb

## 2022-07-19 DIAGNOSIS — B20 Human immunodeficiency virus [HIV] disease: Secondary | ICD-10-CM | POA: Diagnosis present

## 2022-07-19 DIAGNOSIS — Z8619 Personal history of other infectious and parasitic diseases: Secondary | ICD-10-CM

## 2022-07-19 MED ORDER — CABOTEGRAVIR & RILPIVIRINE ER 400 & 600 MG/2ML IM SUER
1.0000 | Freq: Once | INTRAMUSCULAR | Status: AC
Start: 2022-07-19 — End: 2022-07-19
  Administered 2022-07-19: 1 via INTRAMUSCULAR

## 2022-07-19 MED ORDER — SHINGRIX 50 MCG/0.5ML IM SUSR
0.5000 mL | INTRAMUSCULAR | 1 refills | Status: DC
Start: 2022-07-19 — End: 2022-08-30

## 2022-07-19 NOTE — Telephone Encounter (Signed)
[facility] Patient Advocate Encounter  Patient's medication (Cabenuva monthly injection) have been couriered to [facility] from [facility] pharmacy and will be administered on the patient next office visit on 07/19/22.  Clearance Coots , CPhT Specialty Pharmacy Patient [facility] for Infectious Disease Phone: [phone] Fax:  [phone]

## 2022-07-19 NOTE — Progress Notes (Unsigned)
Name: Kimberly Bishop  DOB: [birth year] MRN: [id number] PCP: Patient, No Pcp Per    Brief Narrative:  Kimberly Bishop is a 32 y.o. 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 Started on some medications she thinks early adolescence but not quite clear. CD4 nadir < 50. No history of opportunistic infections to her knowledge aside from oral candidiasis.   Previous Regimens: Tivicay + Odefsey Biktarvy Tivicay + Truvada (pregnancy 10-2018) Cabenuva injections 02/2021 Sunlenca added 10-2021  Genotypes: 3/23 - abacavir, zidovudine resistance. No NNRTI or INSTI resistance   Subjective:   Chief Complaint  Patient presents with   Follow-up      HPI: Kimberly Bishop is here for routine follow up for HIV care.  She delivered her       06/21/2022    9:25 AM  Depression screen PHQ 2/9  Decreased Interest 0  PHQ - 2 Score 0    Review of Systems  Constitutional:  Negative for chills and fever.  Respiratory:  Negative for cough.   Cardiovascular: Negative.   Gastrointestinal: Negative.   Genitourinary: Negative.   Musculoskeletal: Negative.   Skin:  Negative for rash.  Neurological: Negative.     Past Medical History:  Diagnosis Date   AIDS (acquired immunodeficiency syndrome), CD4 <=200 (HCC)    History of kidney stones    History of preterm delivery 04/29/2019   Intrauterine pregnancy at [redacted]w[redacted]d weeks gestation; IUGR 6%, oligohydramnios, HIV with viral load >1000    HIV (human immunodeficiency virus infection) (HCC)    Hypertension    Immune deficiency disorder (HCC)    Infection    UTI   Kidney stones    Molluscum contagiosum 03/22/2016    Outpatient Medications Prior to Visit  Medication Sig Dispense Refill   Blood Pressure Monitoring (BLOOD PRESSURE KIT) DEVI 1 Device by Does not apply route once a week. 1 each 0   cabotegravir & rilpivirine ER (CABENUVA) 400 & 600 MG/2ML injection Inject 1 kit into the muscle every 30 (thirty) days. 4 mL 5   cabotegravir &  rilpivirine ER (CABENUVA) 600 & 900 MG/3ML injection Inject 1 kit into the muscle every 2 (two) months. 6 mL 5   ibuprofen (ADVIL) 600 MG tablet Take 1 tablet (600 mg total) by mouth every 6 (six) hours as needed for headache, mild pain, moderate pain or cramping. 30 tablet 2   NIFEdipine (ADALAT CC) 30 MG 24 hr tablet Take 1 tablet (30 mg total) by mouth 2 (two) times daily. 60 tablet 1   oxyCODONE (OXY IR/ROXICODONE) 5 MG immediate release tablet Take 1 tablet (5 mg total) by mouth every 4 (four) hours as needed for severe pain or breakthrough pain. 30 tablet 0   Prenatal MV & Min w/FA-DHA (PRENATAL ADULT GUMMY/DHA/FA PO) Take by mouth daily.     senna-docusate (SENOKOT-S) 8.6-50 MG tablet Take 2 tablets by mouth 2 (two) times daily as needed for mild constipation or moderate constipation. 30 tablet 0   SQ injection lenacapavir (SUNLENCA) 463.5 MG/1.5ML SQ injection Inject 3 mLs (927 mg total) into the skin every 6 (six) months. Administer each injection subcutaneously at separate sites in the abdomen (more or equal to 2 inches from the navel). 3 mL 1   furosemide (LASIX) 20 MG tablet Take 1 tablet (20 mg total) by mouth 2 (two) times daily for 4 days. 8 tablet 0   No facility-administered medications prior to visit.     No Known Allergies  Social History   Tobacco Use  Smoking status: Never   Smokeless tobacco: Never  Vaping Use   Vaping Use: Never used  Substance Use Topics   Alcohol use: Not Currently    Comment: occasional -    Drug use: Not Currently    Types: Marijuana    Comment: last use 08/24/18    Family History  Adopted: Yes  Problem Relation Age of Onset   Hypertension Mother    Autism Brother    Asthma Neg Hx    Cancer Neg Hx    Diabetes Neg Hx    Heart disease Neg Hx     Social History   Substance and Sexual Activity  Sexual Activity Yes   Partners: Male   Birth control/protection: None, Implant   Comment: declined condoms 06/21/22     Objective:    Vitals:   07/19/22 1002  BP: 123/77  Pulse: 98  Temp: 98.3 F (36.8 C)  TempSrc: Oral  SpO2: 95%  Weight: 148 lb (67.1 kg)    Body mass index is 27.07 kg/m.  Physical Exam Constitutional:      Appearance: Normal appearance. She is not ill-appearing.  HENT:     Mouth/Throat:     Mouth: Mucous membranes are moist.     Pharynx: Oropharynx is clear.  Eyes:     General: No scleral icterus. Cardiovascular:     Rate and Rhythm: Normal rate and regular rhythm.  Pulmonary:     Effort: Pulmonary effort is normal.  Neurological:     Mental Status: She is oriented to person, place, and time.  Psychiatric:        Mood and Affect: Mood normal.        Thought Content: Thought content normal.     Lab Results Lab Results  Component Value Date   WBC 13.4 (H) 07/05/2022   HGB 10.3 (L) 07/05/2022   HCT 32.0 (L) 07/05/2022   MCV 89.6 07/05/2022   PLT 248 07/05/2022    Lab Results  Component Value Date   CREATININE 1.03 (H) 07/06/2022   BUN 22 (H) 07/06/2022   NA 135 07/06/2022   K 4.4 07/06/2022   CL 100 07/06/2022   CO2 23 07/06/2022    Lab Results  Component Value Date   ALT 132 (H) 07/06/2022   AST 87 (H) 07/06/2022   ALKPHOS 134 (H) 07/06/2022   BILITOT 0.4 07/06/2022    Lab Results  Component Value Date   CHOL 182 10/08/2020   HDL 42 (L) 10/08/2020   LDLCALC 122 (H) 10/08/2020   TRIG 83 10/08/2020   CHOLHDL 4.3 10/08/2020   HIV 1 RNA Quant (Copies/mL)  Date Value  06/21/2022 <20 (H)  05/17/2022 Not Detected  03/16/2022 Not Detected   CD4 T Cell Abs (/uL)  Date Value  03/16/2022 348 (L)  10/20/2021 283 (L)  07/20/2021 161 (L)     Assessment & Plan:   Problem List Items Addressed This Visit   None Kimberly Alberts, MSN, NP-C [facility] Pager: [phone] Office: [phone]   07/19/22  10:08 AM

## 2022-07-19 NOTE — Patient Instructions (Signed)
Please schedule a follow up in 1 month to see our pharmacy team - we will get you restarted on the every 2 month dose schedule for cabenuva.   Congratulations! I hope baby boy comes home soon!

## 2022-07-21 ENCOUNTER — Other Ambulatory Visit: Payer: Self-pay

## 2022-07-21 ENCOUNTER — Other Ambulatory Visit (HOSPITAL_COMMUNITY): Payer: Self-pay

## 2022-07-21 MED ORDER — CABOTEGRAVIR & RILPIVIRINE ER 600 & 900 MG/3ML IM SUER
1.0000 | INTRAMUSCULAR | 6 refills | Status: DC
  Filled 2022-07-21 – 2022-08-09 (×2): qty 6, 56d supply, fill #0
  Filled 2022-09-29: qty 6, 56d supply, fill #1
  Filled 2022-11-30: qty 6, 56d supply, fill #2
  Filled 2023-02-02: qty 6, 56d supply, fill #3
  Filled 2023-03-28: qty 6, 56d supply, fill #4
  Filled 2023-05-25: qty 6, 56d supply, fill #5

## 2022-07-21 NOTE — Assessment & Plan Note (Signed)
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 We discussed shingrix vaccine  and recommended 2 doses given 2 m apart. I gave her a prescription to take to pharmacy for this today. [facility].  She meets criteria to receive vaccine given outbreak x 3 and immunocompromising condition. I asked her to call if any problems.

## 2022-07-21 NOTE — Assessment & Plan Note (Addendum)
Perfectly controlled on monthly cabenuva + q63m sunlenca during pregnancy. Last CD4 > 400 cells this year.   She delivered early 2 weeks ago d/t eclampsia/HELLP syndrome but is doing great. We have her monthly dose prepared today and will plan to switch back to Q45m cabenuva regimen in June; will also continue Q63m Sunlenca SQ. Deferred blood draw today given multiple venipunctures and anemia from recent delivery.   She decided to bottle feed and has not been producing BMilk any longer - challenging to keep up with esp with baby boy in the NICU.  No changes to insurance coverage.  No dental needs today.  No concern over anxious/depressed mood.  Sexual health and family planning discussed - nexplanon in place for contraception management.  Vaccines updated today - see health maintenance section.   Next Cabenuva Dose Due: 08/19/2022 (resume Q64m) Next Sunlenca Dose Due: 10/19/2023   Return in about 1 month (around 08/19/2022).

## 2022-07-26 ENCOUNTER — Ambulatory Visit: Payer: 59

## 2022-07-26 ENCOUNTER — Ambulatory Visit: Payer: Medicaid Other

## 2022-07-27 ENCOUNTER — Encounter: Payer: Medicaid Other | Admitting: Family Medicine

## 2022-08-01 ENCOUNTER — Other Ambulatory Visit (HOSPITAL_COMMUNITY): Payer: Self-pay

## 2022-08-03 ENCOUNTER — Other Ambulatory Visit (HOSPITAL_COMMUNITY): Payer: Self-pay

## 2022-08-09 ENCOUNTER — Other Ambulatory Visit (HOSPITAL_COMMUNITY): Payer: Self-pay

## 2022-08-09 ENCOUNTER — Other Ambulatory Visit: Payer: Self-pay

## 2022-08-10 ENCOUNTER — Encounter: Payer: Medicaid Other | Admitting: Family Medicine

## 2022-08-10 ENCOUNTER — Telehealth: Payer: Self-pay

## 2022-08-10 NOTE — Telephone Encounter (Signed)
[facility] Patient Advocate Encounter  Patient's medication (Cabenuva 600 &900mg ) have been couriered to [facility] from [facility] pharmacy and will be administered on the patient next office visit on 08/16/22.  Clearance Coots , CPhT Specialty Pharmacy Patient [facility] for Infectious Disease Phone: [phone] Fax:  [phone]

## 2022-08-16 ENCOUNTER — Ambulatory Visit (INDEPENDENT_AMBULATORY_CARE_PROVIDER_SITE_OTHER): Payer: Medicaid Other | Admitting: Medical

## 2022-08-16 ENCOUNTER — Encounter: Payer: Self-pay | Admitting: Medical

## 2022-08-16 DIAGNOSIS — D259 Leiomyoma of uterus, unspecified: Secondary | ICD-10-CM | POA: Insufficient documentation

## 2022-08-16 DIAGNOSIS — Z98891 History of uterine scar from previous surgery: Secondary | ICD-10-CM

## 2022-08-16 DIAGNOSIS — Z87898 Personal history of other specified conditions: Secondary | ICD-10-CM

## 2022-08-16 DIAGNOSIS — Z21 Asymptomatic human immunodeficiency virus [HIV] infection status: Secondary | ICD-10-CM

## 2022-08-16 DIAGNOSIS — Z8759 Personal history of other complications of pregnancy, childbirth and the puerperium: Secondary | ICD-10-CM | POA: Diagnosis not present

## 2022-08-16 DIAGNOSIS — Z975 Presence of (intrauterine) contraceptive device: Secondary | ICD-10-CM

## 2022-08-16 NOTE — Progress Notes (Signed)
Post Partum Visit Note  Kimberly Bishop is a 32 y.o. [phone] female who presents for a postpartum visit. She is 6 weeks postpartum following a repeat cesarean section.  I have fully reviewed the prenatal and intrapartum course. The delivery was at 30.3 gestational weeks.  Anesthesia: epidural. Postpartum course has been complicated by HELLP. Baby is doing well, still in NICU, but doing well. Baby is feeding by  [facility] . Bleeding no bleeding. Bowel function is normal. Bladder function is normal. Patient is sexually active. Contraception method is Nexplanon. Postpartum depression screening: negative.   Upstream - 08/16/22 1014       Pregnancy Intention Screening   Does the patient want to become pregnant in the next year? No    Does the patient's partner want to become pregnant in the next year? No    Would the patient like to discuss contraceptive options today? No      Contraception Wrap Up   Current Method Hormonal Implant    End Method Hormonal Implant    Contraception Counseling Provided No    How was the end contraceptive method provided? N/A            The pregnancy intention screening data noted above was reviewed. Potential methods of contraception were discussed. The patient elected to proceed with Hormonal Implant.   Edinburgh Postnatal Depression Scale - 08/16/22 1012       Edinburgh Postnatal Depression Scale:  In the Past 7 Days   I have been able to laugh and see the funny side of things. 0    I have looked forward with enjoyment to things. 0    I have blamed myself unnecessarily when things went wrong. 0    I have been anxious or worried for no good reason. 0    I have felt scared or panicky for no good reason. 0    Things have been getting on top of me. 0    I have been so unhappy that I have had difficulty sleeping. 0    I have felt sad or miserable. 0    I have been so unhappy that I have been crying. 0    The thought of harming myself  has occurred to me. 0    Edinburgh Postnatal Depression Scale Total 0             Health Maintenance Due  Topic Date Due   COVID-19 Vaccine (1) Never done   PAP SMEAR-Modifier  08/24/2022    The following portions of the patient's history were reviewed and updated as appropriate: allergies, current medications, past family history, past medical history, past social history, past surgical history, and problem list.  Review of Systems Pertinent items are noted in HPI.  Objective:  BP (!) 145/86   Pulse 78   Wt 146 lb (66.2 kg)   LMP 12/02/2021 (Exact Date)   Breastfeeding No   BMI 26.70 kg/m    General:  alert and cooperative   Breasts:  not indicated  Lungs: clear to auscultation bilaterally  Heart:  regular rate and rhythm, S1, S2 normal, no murmur, click, rub or gallop  Abdomen: soft, non-tender; bowel sounds normal; no masses,  no organomegaly   Wound well approximated incision  GU exam:  not indicated       Assessment:   Normal  postpartum exam.  History of pre-eclampsia  History of HELLP  History of poor fetal growth  Nexplanon in place  Plan:   Essential components of care per ACOG recommendations:  1.  Mood and well being: Patient with negative depression screening today. Reviewed local resources for support.  - Patient tobacco use? No.   - hx of drug use? No.    2. Infant care and feeding:  -Patient currently breastmilk feeding? No.  -Social determinants of health (SDOH) reviewed in EPIC. No concerns  3. Sexuality, contraception and birth spacing - Patient does not want a pregnancy in the next year.  Desired family size is 3 children.  - Reviewed reproductive life planning. Reviewed contraceptive methods based on pt preferences and effectiveness.  Patient desired Hormonal Implant today.   - Discussed birth spacing of 18 months  4. Sleep and fatigue -Encouraged family/partner/community support of 4 hrs of uninterrupted sleep to help with mood and  fatigue  5. Physical Recovery  - Discussed patients delivery and complications. - Patient had a C-section emergent.  - Patient has urinary incontinence? No. - Patient is safe to resume physical and sexual activity  6.  Health Maintenance - HM due items addressed Yes - Last pap smear  Diagnosis  Date Value Ref Range Status  02/22/2022   Final   - Negative for intraepithelial lesion or malignancy (NILM)   Pap smear not done at today's visit.  -Breast Cancer screening indicated? No.   7. Chronic Disease/Pregnancy Condition follow up: Hypertension - PCP follow up, referral placed to establish care  - Restart Procardia based on today's BP values     08/16/2022   10:17 AM 08/16/2022   10:08 AM 07/19/2022   10:02 AM  Vitals with BMI  Weight  146 lbs 148 lbs  BMI   27.06  Systolic 145 119 604  Diastolic 86 97 77  Pulse 78 87 98     Vonzella Nipple, PA-C [facility] [employer], [facility]

## 2022-08-17 ENCOUNTER — Ambulatory Visit: Payer: Medicaid Other | Admitting: Pharmacist

## 2022-08-17 ENCOUNTER — Other Ambulatory Visit (HOSPITAL_COMMUNITY): Payer: Self-pay

## 2022-08-17 ENCOUNTER — Other Ambulatory Visit: Payer: Self-pay

## 2022-08-17 DIAGNOSIS — B20 Human immunodeficiency virus [HIV] disease: Secondary | ICD-10-CM | POA: Diagnosis present

## 2022-08-17 MED ORDER — CABOTEGRAVIR & RILPIVIRINE ER 600 & 900 MG/3ML IM SUER
1.0000 | Freq: Once | INTRAMUSCULAR | Status: AC
Start: 2022-08-17 — End: 2022-08-17
  Administered 2022-08-17: 1 via INTRAMUSCULAR

## 2022-08-17 NOTE — Progress Notes (Signed)
HPI: Kimberly Bishop is a 32 y.o. female who presents to the [facility] pharmacy clinic for [place] administration.  Patient Active Problem List   Diagnosis Date Noted   Uterine fibroid 08/16/2022   Nexplanon in place since 07/06/22 07/06/2022   Cesarean delivery delivered 07/03/2022   History of poor fetal growth 06/28/2022   Vitamin D deficiency 07/22/2021   Nephrolithiasis 01/18/2021   History of cesarean delivery 04/29/2019   History of pre-eclampsia 03/29/2019   HIV (human immunodeficiency virus infection) (HCC) 03/29/2019   CIN I (cervical intraepithelial neoplasia I) 11/28/2018   ASCUS with positive high risk HPV cervical 11/26/2018   History of shingles 03/22/2016    Patient's Medications  New Prescriptions   No medications on file  Previous Medications   BLOOD PRESSURE MONITORING (BLOOD PRESSURE KIT) DEVI    1 Device by Does not apply route once a week.   CABOTEGRAVIR & RILPIVIRINE ER (CABENUVA) 600 & 900 MG/3ML INJECTION    Inject 1 kit into the muscle every 2 (two) months.   CABOTEGRAVIR & RILPIVIRINE ER (CABENUVA) 600 & 900 MG/3ML INJECTION    Inject 1 kit into the muscle every 8 (eight) weeks.   FUROSEMIDE (LASIX) 20 MG TABLET    Take 1 tablet (20 mg total) by mouth 2 (two) times daily for 4 days.   IBUPROFEN (ADVIL) 600 MG TABLET    Take 1 tablet (600 mg total) by mouth every 6 (six) hours as needed for headache, mild pain, moderate pain or cramping.   NIFEDIPINE (ADALAT CC) 30 MG 24 HR TABLET    Take 1 tablet (30 mg total) by mouth 2 (two) times daily.   OXYCODONE (OXY IR/ROXICODONE) 5 MG IMMEDIATE RELEASE TABLET    Take 1 tablet (5 mg total) by mouth every 4 (four) hours as needed for severe pain or breakthrough pain.   PRENATAL MV & MIN W/FA-DHA (PRENATAL ADULT GUMMY/DHA/FA PO)    Take by mouth daily.   SENNA-DOCUSATE (SENOKOT-S) 8.6-50 MG TABLET    Take 2 tablets by mouth 2 (two) times daily as needed for mild constipation or moderate constipation.   SQ INJECTION  LENACAPAVIR (SUNLENCA) 463.5 MG/1.5ML SQ INJECTION    Inject 3 mLs (927 mg total) into the skin every 6 (six) months. Administer each injection subcutaneously at separate sites in the abdomen (more or equal to 2 inches from the navel).   ZOSTER VACCINE ADJUVANTED (SHINGRIX) INJECTION    Inject 0.5 mLs into the muscle every 8 (eight) weeks.  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)    History of kidney stones    History of preterm delivery 04/29/2019   Intrauterine pregnancy at [redacted]w[redacted]d weeks gestation; IUGR 6%, oligohydramnios, HIV with viral load >1000    HIV (human immunodeficiency virus infection) (HCC)    Hypertension    Immune deficiency disorder (HCC)    Infection    UTI   Kidney stones    Molluscum contagiosum 03/22/2016    Social History: Social History   Socioeconomic History   Marital status: Married    Spouse name: Not on file   Number of children: Not on file   Years of education: Not on file   Highest education level: Not on file  Occupational History    Comment: unemployed  Tobacco  Use   Smoking status: Never   Smokeless tobacco: Never  Vaping Use   Vaping Use: Never used  Substance and Sexual Activity   Alcohol use: Not Currently    Comment: occasional -    Drug use: Not Currently    Types: Marijuana    Comment: last use 08/24/18   Sexual activity: Yes    Partners: Male    Birth control/protection: None, Implant    Comment: declined condoms 06/21/22  Other Topics Concern   Not on file  Social History Narrative   Not on file   Social Determinants of Health   Financial Resource Strain: Not on file  Food Insecurity: No Food Insecurity (06/28/2022)   Hunger Vital Sign    Worried About Running Out of Food in the Last Year: Never true    Ran  Out of Food in the Last Year: Never true  Transportation Needs: No Transportation Needs (06/28/2022)   PRAPARE - Administrator, Civil Service (Medical): No    Lack of Transportation (Non-Medical): No  Physical Activity: Not on file  Stress: Not on file  Social Connections: Not on file    Labs: Lab Results  Component Value Date   HIV1RNAQUANT <20 (H) 06/21/2022   HIV1RNAQUANT Not Detected 05/17/2022   HIV1RNAQUANT Not Detected 03/16/2022   CD4TABS 348 (L) 03/16/2022   CD4TABS 283 (L) 10/20/2021   CD4TABS 161 (L) 07/20/2021    RPR and STI Lab Results  Component Value Date   LABRPR NON REACTIVE 06/28/2022   LABRPR Non Reactive 02/22/2022   LABRPR NON-REACTIVE 02/18/2021   LABRPR NON-REACTIVE 10/13/2020   LABRPR NON REACTIVE 03/26/2019    STI Results GC GC CT CT  Latest Ref Rng & Units  NEGATIVE  NEGATIVE  06/28/2022 11:07 AM Negative   Negative    02/22/2022 10:49 AM Negative   Negative    08/18/2021  4:45 PM Negative   Negative    10/13/2020  9:03 AM Negative   Negative    03/26/2019 12:43 PM Negative   Negative    10/18/2018 12:00 AM Negative   Negative    05/25/2018 12:00 AM Negative   **POSITIVE**    03/28/2017 12:00 AM Negative   Negative    02/11/2017 12:00 AM Negative   Negative    07/07/2016 12:00 AM Negative   Negative    10/27/2015 12:00 AM Negative   Negative    02/07/2015 12:00 AM Negative   **POSITIVE**    05/19/2014 12:00 AM NG: Negative   CT: Negative    12/23/2013  3:05 PM  NEGATIVE   NEGATIVE   05/16/2013 12:00 AM NG: Negative   CT: Negative    10/22/2012  5:48 PM  NEGATIVE   NEGATIVE   02/12/2012 10:52 PM  NEGATIVE   NEGATIVE     Hepatitis B Lab Results  Component Value Date   HEPBSAB NON-REACTIVE 09/10/2020   HEPBSAG Negative 02/22/2022   HEPBCAB NON-REACTIVE 09/10/2020   Hepatitis C Lab Results  Component Value Date   HEPCAB NON-REACTIVE 09/10/2020   Hepatitis A Lab Results  Component Value Date   HAV NEG 12/09/2011    Lipids: Lab Results  Component Value Date   CHOL 182 10/08/2020   TRIG 83 10/08/2020   HDL 42 (L) 10/08/2020   CHOLHDL 4.3 10/08/2020   VLDL 9 10/27/2015   LDLCALC 122 (H) 10/08/2020    TARGET DATE: The 8th  Assessment: Kimberly Bishop presents today for her maintenance Cabenuva injections. She has been receiving  her Cabenuva monthly since she was pregnant and after she delivered. She will transition back to the every 2 month dose today. Her baby is doing well in the NICU and will probably get to come home in a week. She is recovering well from her C-section. She is due for her Sunlenca injections at her next appointment in August. Will defer labs until then.  Administered cabotegravir 600mg /65mL in left upper outer quadrant of the gluteal muscle. Administered rilpivirine 900 mg/40mL in the right upper outer quadrant of the gluteal muscle. No issues with injections. She will follow up in 2 months for next set of injections.  Plan: - Cabenuva injections administered - switched to q2 month dose today - Next [place] and Sunlenca injections scheduled for 10/19/22 - Call with any issues or questions  Fabiola Mudgett L. Demonie Kassa, PharmD, BCIDP, AAHIVP, CPP Clinical Pharmacist Practitioner Infectious Diseases Clinical Pharmacist [facility]

## 2022-08-18 ENCOUNTER — Ambulatory Visit: Payer: Medicaid Other | Admitting: Family Medicine

## 2022-08-25 ENCOUNTER — Other Ambulatory Visit (HOSPITAL_COMMUNITY): Payer: Self-pay

## 2022-08-30 ENCOUNTER — Ambulatory Visit: Payer: Medicaid Other

## 2022-08-30 ENCOUNTER — Ambulatory Visit (INDEPENDENT_AMBULATORY_CARE_PROVIDER_SITE_OTHER): Payer: Medicaid Other

## 2022-08-30 DIAGNOSIS — Z0130 Encounter for examination of blood pressure without abnormal findings: Secondary | ICD-10-CM

## 2022-08-30 NOTE — Progress Notes (Signed)
Patient is 8 weeks postpartum.  Patient states that she does have some headaches doesn't have one currently. No dizziness or blurry vision.  Consulted with Dr. Donavan Foil and will leave patient on current medication regimen and will ensure follow up on referral to primary care. Armandina Stammer RN

## 2022-09-21 ENCOUNTER — Ambulatory Visit: Payer: Medicaid Other | Admitting: Family Medicine

## 2022-09-29 ENCOUNTER — Other Ambulatory Visit (HOSPITAL_COMMUNITY): Payer: Self-pay

## 2022-10-04 ENCOUNTER — Other Ambulatory Visit (HOSPITAL_COMMUNITY): Payer: Self-pay

## 2022-10-05 ENCOUNTER — Other Ambulatory Visit: Payer: Self-pay

## 2022-10-05 ENCOUNTER — Other Ambulatory Visit (HOSPITAL_COMMUNITY): Payer: Self-pay

## 2022-10-06 ENCOUNTER — Telehealth: Payer: Self-pay

## 2022-10-06 NOTE — Telephone Encounter (Signed)
[facility] Patient Advocate Encounter  Patient's medication Renaldo Harrison) have been couriered to [facility] from [employer] and will be administered on the patient next office visit on 10/19/22.  Clearance Coots , CPhT Specialty Pharmacy Patient [facility] for Infectious Disease Phone: [phone] Fax:  [phone]

## 2022-10-17 ENCOUNTER — Other Ambulatory Visit (HOSPITAL_COMMUNITY): Payer: Self-pay

## 2022-10-19 ENCOUNTER — Encounter: Payer: Medicaid Other | Admitting: Pharmacist

## 2022-10-19 ENCOUNTER — Ambulatory Visit (INDEPENDENT_AMBULATORY_CARE_PROVIDER_SITE_OTHER): Payer: Medicaid Other | Admitting: Pharmacist

## 2022-10-19 ENCOUNTER — Other Ambulatory Visit: Payer: Self-pay

## 2022-10-19 DIAGNOSIS — B20 Human immunodeficiency virus [HIV] disease: Secondary | ICD-10-CM | POA: Diagnosis present

## 2022-10-19 DIAGNOSIS — Z79899 Other long term (current) drug therapy: Secondary | ICD-10-CM

## 2022-10-19 DIAGNOSIS — Z113 Encounter for screening for infections with a predominantly sexual mode of transmission: Secondary | ICD-10-CM

## 2022-10-19 LAB — CBC WITH DIFFERENTIAL/PLATELET
Absolute Monocytes: 369 cells/uL (ref 200–950)
Basophils Absolute: 11 cells/uL (ref 0–200)
Basophils Relative: 0.2 %
Eosinophils Absolute: 138 cells/uL (ref 15–500)
Eosinophils Relative: 2.5 %
HCT: 38.5 % (ref 35.0–45.0)
Hemoglobin: 12.8 g/dL (ref 11.7–15.5)
Lymphs Abs: 1260 cells/uL (ref 850–3900)
MCH: 28.1 pg (ref 27.0–33.0)
MCHC: 33.2 g/dL (ref 32.0–36.0)
MCV: 84.6 fL (ref 80.0–100.0)
MPV: 11.3 fL (ref 7.5–12.5)
Monocytes Relative: 6.7 %
Neutro Abs: 3724 cells/uL (ref 1500–7800)
Neutrophils Relative %: 67.7 %
Platelets: 307 10*3/uL (ref 140–400)
RBC: 4.55 10*6/uL (ref 3.80–5.10)
RDW: 14.5 % (ref 11.0–15.0)
Total Lymphocyte: 22.9 %
WBC: 5.5 10*3/uL (ref 3.8–10.8)

## 2022-10-19 MED ORDER — LENACAPAVIR SODIUM 463.5 MG/1.5ML ~~LOC~~ SOLN
927.0000 mg | Freq: Once | SUBCUTANEOUS | Status: AC
Start: 2022-10-19 — End: 2022-10-19
  Administered 2022-10-19: 927 mg via SUBCUTANEOUS

## 2022-10-19 MED ORDER — CABOTEGRAVIR & RILPIVIRINE ER 600 & 900 MG/3ML IM SUER
1.0000 | Freq: Once | INTRAMUSCULAR | Status: AC
Start: 2022-10-19 — End: 2022-10-19
  Administered 2022-10-19: 1 via INTRAMUSCULAR

## 2022-10-19 NOTE — Progress Notes (Signed)
HPI: Kimberly Bishop is a 32 y.o. female who presents to the [facility] pharmacy clinic for [place] administration.  Patient Active Problem List   Diagnosis Date Noted   Uterine fibroid 08/16/2022   Nexplanon in place since 07/06/22 07/06/2022   Cesarean delivery delivered 07/03/2022   History of poor fetal growth 06/28/2022   Vitamin D deficiency 07/22/2021   Nephrolithiasis 01/18/2021   History of cesarean delivery 04/29/2019   History of pre-eclampsia 03/29/2019   HIV (human immunodeficiency virus infection) (HCC) 03/29/2019   CIN I (cervical intraepithelial neoplasia I) 11/28/2018   ASCUS with positive high risk HPV cervical 11/26/2018   History of shingles 03/22/2016    Patient's Medications  New Prescriptions   No medications on file  Previous Medications   BLOOD PRESSURE MONITORING (BLOOD PRESSURE KIT) DEVI    1 Device by Does not apply route once a week.   CABOTEGRAVIR & RILPIVIRINE ER (CABENUVA) 600 & 900 MG/3ML INJECTION    Inject 1 kit into the muscle every 2 (two) months.   CABOTEGRAVIR & RILPIVIRINE ER (CABENUVA) 600 & 900 MG/3ML INJECTION    Inject 1 kit into the muscle every 8 (eight) weeks.   FUROSEMIDE (LASIX) 20 MG TABLET    Take 1 tablet (20 mg total) by mouth 2 (two) times daily for 4 days.   IBUPROFEN (ADVIL) 600 MG TABLET    Take 1 tablet (600 mg total) by mouth every 6 (six) hours as needed for headache, mild pain, moderate pain or cramping.   NIFEDIPINE (ADALAT CC) 30 MG 24 HR TABLET    Take 1 tablet (30 mg total) by mouth 2 (two) times daily.   OXYCODONE (OXY IR/ROXICODONE) 5 MG IMMEDIATE RELEASE TABLET    Take 1 tablet (5 mg total) by mouth every 4 (four) hours as needed for severe pain or breakthrough pain.   PRENATAL MV & MIN W/FA-DHA (PRENATAL ADULT GUMMY/DHA/FA PO)    Take by mouth daily.   SENNA-DOCUSATE (SENOKOT-S) 8.6-50 MG TABLET    Take 2 tablets by mouth 2 (two) times daily as needed for mild constipation or moderate constipation.   SQ INJECTION  LENACAPAVIR (SUNLENCA) 463.5 MG/1.5ML SQ INJECTION    Inject 3 mLs (927 mg total) into the skin every 6 (six) months. Administer each injection subcutaneously at separate sites in the abdomen (more or equal to 2 inches from the navel).  Modified Medications   No medications on file  Discontinued Medications   No medications on file    Allergies: No Known Allergies  Labs: Lab Results  Component Value Date   HIV1RNAQUANT <20 (H) 06/21/2022   HIV1RNAQUANT Not Detected 05/17/2022   HIV1RNAQUANT Not Detected 03/16/2022   CD4TABS 348 (L) 03/16/2022   CD4TABS 283 (L) 10/20/2021   CD4TABS 161 (L) 07/20/2021    RPR and STI Lab Results  Component Value Date   LABRPR NON REACTIVE 06/28/2022   LABRPR Non Reactive 02/22/2022   LABRPR NON-REACTIVE 02/18/2021   LABRPR NON-REACTIVE 10/13/2020   LABRPR NON REACTIVE 03/26/2019    STI Results GC GC CT CT  Latest Ref Rng & Units  NEGATIVE  NEGATIVE  06/28/2022 11:07 AM Negative   Negative    02/22/2022 10:49 AM Negative   Negative    08/18/2021  4:45 PM Negative   Negative    10/13/2020  9:03 AM Negative   Negative    03/26/2019 12:43 PM Negative   Negative    10/18/2018 12:00 AM Negative   Negative  05/25/2018 12:00 AM Negative   **POSITIVE**    03/28/2017 12:00 AM Negative   Negative    02/11/2017 12:00 AM Negative   Negative    07/07/2016 12:00 AM Negative   Negative    10/27/2015 12:00 AM Negative   Negative    02/07/2015 12:00 AM Negative   **POSITIVE**    05/19/2014 12:00 AM NG: Negative   CT: Negative    12/23/2013  3:05 PM  NEGATIVE   NEGATIVE   05/16/2013 12:00 AM NG: Negative   CT: Negative    10/22/2012  5:48 PM  NEGATIVE   NEGATIVE   02/12/2012 10:52 PM  NEGATIVE   NEGATIVE     Hepatitis B Lab Results  Component Value Date   HEPBSAB NON-REACTIVE 09/10/2020   HEPBSAG Negative 02/22/2022   HEPBCAB NON-REACTIVE 09/10/2020   Hepatitis C Lab Results  Component Value Date   HEPCAB NON-REACTIVE 09/10/2020   Hepatitis  A Lab Results  Component Value Date   HAV NEG 12/09/2011   Lipids: Lab Results  Component Value Date   CHOL 182 10/08/2020   TRIG 83 10/08/2020   HDL 42 (L) 10/08/2020   CHOLHDL 4.3 10/08/2020   VLDL 9 10/27/2015   LDLCALC 122 (H) 10/08/2020    TARGET DATE: Renaldo Harrison - the 8th Sunlenca - next due in February 2025  Assessment: Kimberly Bishop  presents today for her maintenance Cabenuva injections. Past injections were tolerated well. She is adjusting to newborn life and her and her son are currently doing well. Administered cabotegravir 600mg /24mL in left upper outer quadrant of the gluteal muscle. Administered rilpivirine 900 mg/43mL in the right upper outer quadrant of the gluteal muscle. No issues with injections.   She is also do for her twice yearly Sunlenca injections. Administered lenacapavir 463.5/1.53mL subcutaneously in upper left abdomen. Administered lenacapavir 463.5/1.61mL subcutaneously in lower left abdomen more than two inches away from first injection site. Monitored patient for 10 minutes after injection. Injections were tolerated well without issue.   Last HIV RNA was in April and so were her routine labs that were done inpatient when she gave birth. Will repeat all labs today. She will follow up in 2 months for her next Cabenuva injections. Of note, Judeth Cornfield was unavailable during her injection window in October so will schedule her with Judeth Cornfield for her December injections.   Plan: - Cabenuva injections administered in gluteal muscles - Sunlenca injections administered in abdomen - HIV RNA, CD4, RPR, lipid panel, CBC with diff, and CMP today - Next Cabenuva injections scheduled for 12/13/22 with me and 02/21/23 with Judeth Cornfield - Next [place] injections due in February - Call with any issues or questions   L. , PharmD, BCIDP, AAHIVP, CPP Clinical Pharmacist Practitioner Infectious Diseases Clinical Pharmacist [facility]

## 2022-10-20 NOTE — Progress Notes (Signed)
Hey - wanted you to be aware of CMP and lipid panel just as a FYI. Thank you

## 2022-10-26 ENCOUNTER — Ambulatory Visit: Payer: Medicaid Other | Admitting: Family Medicine

## 2022-10-26 ENCOUNTER — Encounter: Payer: Self-pay | Admitting: Family Medicine

## 2022-10-26 VITALS — BP 137/94 | HR 81 | Ht 62.0 in | Wt 149.0 lb

## 2022-10-26 DIAGNOSIS — Z21 Asymptomatic human immunodeficiency virus [HIV] infection status: Secondary | ICD-10-CM

## 2022-10-26 DIAGNOSIS — E782 Mixed hyperlipidemia: Secondary | ICD-10-CM

## 2022-10-26 DIAGNOSIS — R748 Abnormal levels of other serum enzymes: Secondary | ICD-10-CM | POA: Diagnosis not present

## 2022-10-26 DIAGNOSIS — I10 Essential (primary) hypertension: Secondary | ICD-10-CM | POA: Insufficient documentation

## 2022-10-26 HISTORY — DX: Mixed hyperlipidemia: E78.2

## 2022-10-26 NOTE — Assessment & Plan Note (Signed)
Stable on Cabenuva injections. Followed by Infectious Disease specialist.

## 2022-10-26 NOTE — Progress Notes (Signed)
New Patient Office Visit  Subjective    Patient ID: Kimberly Bishop, female    DOB: April 09, 1990  Age: 32 y.o. MRN: [id number]  CC:  Chief Complaint  Patient presents with   Establish Care    HPI Kimberly [place] presents to establish care. 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 She is married with a 84 year old and 58 month old.  Discussed the use of AI scribe software for clinical note transcription with the patient, who gave verbal consent to proceed.  History of Present Illness   The patient, a 32 year old with a history of HIV since birth, recently gave birth to a child who was two months premature. The patient reports that the child is doing well, despite some initial breathing issues. The patient also has another child, a three-year-old. 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  The patient reports her HIV had progressed to AIDS at one point due to difficulties with medication adherence. However, the patient is now on injections, which have improved her condition significantly. The patient also has a history of kidney stones, but is not currently having any symptoms   The patient developed high blood pressure during both pregnancies, with the most recent pregnancy resulting in more severe hypertension. This led to an early delivery at approximately 31 weeks. The patient has been restarted on nifedipine post-delivery, but admits to forgetting to take the medication consistently.  The patient has been experiencing occasional headaches, which may be related to the high blood pressure. Her blood pressure readings at home are typically in the 130s over 80s. The patient is trying to follow a heart-healthy, low sodium diet and gets some exercise through walking.  The patient's recent labs showed high cholesterol and elevated liver enzymes. She admits to a poor diet, which may be contributing to the high cholesterol. The cause of the elevated liver enzymes could be  related to her HIV medication - currently trending down compared to the last few months. Will continue to monitor. The patient is not currently breastfeeding and uses Nexplanon for contraception.        Hypertension: - Medications: Nifedipine 30 mg BID  - Compliance: fair, forgets often - Checking BP at home: yes, 130s/80s - Denies any SOB, CP, vision changes, LE edema, dizziness, palpitations, or medication side effects. Occasional headaches. - Diet: low sodium  - Exercise: walking - Stressors: new baby (preterm delivery in April), not breastfeeding     Outpatient Encounter Medications as of 10/26/2022  Medication Sig   cabotegravir & rilpivirine ER (CABENUVA) 600 & 900 MG/3ML injection Inject 1 kit into the muscle every 8 (eight) weeks.   etonogestrel (NEXPLANON) 68 MG IMPL implant 1 each by Subdermal route once.   NIFEdipine (ADALAT CC) 30 MG 24 hr tablet Take 1 tablet (30 mg total) by mouth 2 (two) times daily.   SQ injection lenacapavir (SUNLENCA) 463.5 MG/1.5ML SQ injection Inject 3 mLs (927 mg total) into the skin every 6 (six) months. Administer each injection subcutaneously at separate sites in the abdomen (more or equal to 2 inches from the navel).   [DISCONTINUED] Blood Pressure Monitoring (BLOOD PRESSURE KIT) DEVI 1 Device by Does not apply route once a week.   [DISCONTINUED] cabotegravir & rilpivirine ER (CABENUVA) 600 & 900 MG/3ML injection Inject 1 kit into the muscle every 2 (two) months.   [DISCONTINUED] furosemide (LASIX) 20 MG tablet Take 1 tablet (20 mg total) by mouth 2 (two) times daily for  4 days.   [DISCONTINUED] ibuprofen (ADVIL) 600 MG tablet Take 1 tablet (600 mg total) by mouth every 6 (six) hours as needed for headache, mild pain, moderate pain or cramping. (Patient not taking: Reported on 08/16/2022)   [DISCONTINUED] oxyCODONE (OXY IR/ROXICODONE) 5 MG immediate release tablet Take 1 tablet (5 mg total) by mouth every 4 (four) hours as needed for severe pain or  breakthrough pain. (Patient not taking: Reported on 08/16/2022)   [DISCONTINUED] Prenatal MV & Min w/FA-DHA (PRENATAL ADULT GUMMY/DHA/FA PO) Take by mouth daily.   [DISCONTINUED] senna-docusate (SENOKOT-S) 8.6-50 MG tablet Take 2 tablets by mouth 2 (two) times daily as needed for mild constipation or moderate constipation. (Patient not taking: Reported on 08/16/2022)   No facility-administered encounter medications on file as of 10/26/2022.    Past Medical History:  Diagnosis Date   AIDS (acquired immunodeficiency syndrome), CD4 <=200 (HCC)    History of cesarean delivery 04/29/2019   PCS for HIV VL>1000 in setting of IUGR, oligo, at 36 weeks     History of kidney stones    History of preterm delivery 04/29/2019   Intrauterine pregnancy at [redacted]w[redacted]d weeks gestation; IUGR 6%, oligohydramnios, HIV with viral load >1000    HIV (human immunodeficiency virus infection) (HCC)    Hypertension    Immune deficiency disorder (HCC)    Infection    UTI   Kidney stones    Mixed hyperlipidemia 10/26/2022   Molluscum contagiosum 03/22/2016    Past Surgical History:  Procedure Laterality Date   APPENDECTOMY  2011   CESAREAN SECTION N/A 03/29/2019   Procedure: CESAREAN SECTION;  Surgeon: Tilda Burrow, MD;  Location: [facility];  Service: Obstetrics;  Laterality: N/A;   CESAREAN SECTION N/A 07/03/2022   Procedure: CESAREAN SECTION;  Surgeon: Riverton Bing, MD;  Location: [facility];  Service: Obstetrics;  Laterality: N/A;   CYSTOSCOPY W/ URETERAL STENT PLACEMENT Right 11/03/2016   Procedure: CYSTOSCOPY WITH RETROGRADE PYELOGRAM/URETERAL STENT PLACEMENT;  Surgeon: Ihor Gully, MD;  Location: [facility];  Service: Urology;  Laterality: Right;   CYSTOSCOPY/URETEROSCOPY/HOLMIUM LASER/STENT PLACEMENT Right 01/16/2017   Procedure: CYSTOSCOPY/URETEROSCOPY/ STONE EXTRACTION/STENT removal;  Surgeon: Ihor Gully, MD;  Location: [facility];  Service: Urology;  Laterality: Right;   IR DIL URETER RIGHT  12/05/2016    IR NEPHROSTOMY PLACEMENT RIGHT  11/07/2016   IR URETERAL STENT PLACEMENT EXISTING ACCESS RIGHT  12/05/2016   NEPHROLITHOTOMY Right 12/05/2016   Procedure: NEPHROLITHOTOMY PERCUTANEOUS;  Surgeon: Ihor Gully, MD;  Location: [facility];  Service: Urology;  Laterality: Right;    Family History  Adopted: Yes  Problem Relation Age of Onset   Hypertension Mother    HIV/AIDS Mother    Autism Brother    Asthma Neg Hx    Cancer Neg Hx    Diabetes Neg Hx    Heart disease Neg Hx     Social History   Socioeconomic History   Marital status: Married    Spouse name: Not on file   Number of children: Not on file   Years of education: Not on file   Highest education level: Not on file  Occupational History    Comment: unemployed  Tobacco Use   Smoking status: Never   Smokeless tobacco: Never  Vaping Use   Vaping status: Never Used  Substance and Sexual Activity   Alcohol use: Not Currently    Comment: occasional -    Drug use: Not Currently    Types: Marijuana    Comment: last use 08/24/18  Sexual activity: Yes    Partners: Male    Birth control/protection: Implant, None    Comment: declined condoms 06/21/22  Other Topics Concern   Not on file  Social History Narrative   Not on file   Social Determinants of Health   Financial Resource Strain: Not on file  Food Insecurity: No Food Insecurity (06/28/2022)   Hunger Vital Sign    Worried About Running Out of Food in the Last Year: Never true    Ran Out of Food in the Last Year: Never true  Transportation Needs: No Transportation Needs (06/28/2022)   PRAPARE - Administrator, Civil Service (Medical): No    Lack of Transportation (Non-Medical): No  Physical Activity: Not on file  Stress: Not on file  Social Connections: Not on file  Intimate Partner Violence: Not At Risk (06/28/2022)   Humiliation, Afraid, Rape, and Kick questionnaire    Fear of Current or Ex-Partner: No    Emotionally Abused: No    Physically Abused:  No    Sexually Abused: No    ROS All review of systems negative except what is listed in the HPI      Objective    BP (!) 137/94   Pulse 81   Ht 5\' 2"  (1.575 m)   Wt 149 lb (67.6 kg)   SpO2 100%   BMI 27.25 kg/m   Physical Exam Vitals reviewed.  Constitutional:      General: She is not in acute distress.    Appearance: Normal appearance. She is not ill-appearing.  Cardiovascular:     Rate and Rhythm: Normal rate and regular rhythm.     Heart sounds: Normal heart sounds.  Pulmonary:     Effort: Pulmonary effort is normal.     Breath sounds: Normal breath sounds.  Musculoskeletal:     Cervical back: Normal range of motion and neck supple.     Right lower leg: No edema.     Left lower leg: No edema.  Skin:    General: Skin is warm and dry.  Neurological:     Mental Status: She is alert and oriented to person, place, and time.  Psychiatric:        Mood and Affect: Mood normal.        Behavior: Behavior normal.        Thought Content: Thought content normal.        Judgment: Judgment normal.             Assessment & Plan:   Problem List Items Addressed This Visit     HIV (human immunodeficiency virus infection) (HCC) (Chronic)    Stable on Cabenuva injections. Followed by Infectious Disease specialist.       Mixed hyperlipidemia (Chronic)    Recent labs showed elevated cholesterol levels. -Encouraged dietary modifications, specifically reducing intake of trans fats and saturated fats. -Plan to recheck lipid panel in 3-6 months.      Primary hypertension - Primary (Chronic)    Blood pressure is not at goal for age and co-morbidities.   Recommendations: Nifedipine 30 mg twice daily (set an alarm on your phone to remember to take it) - BP goal <130/80 - monitor and log blood pressures at home - check around the same time each day in a relaxed setting - Limit salt to <2000 mg/day - Follow DASH eating plan (heart healthy diet) - limit alcohol to 2  standard drinks per day for men and 1 per day for  women - avoid tobacco products - get at least 2 hours of regular aerobic exercise weekly Patient aware of signs/symptoms requiring further/urgent evaluation. Follow-up 2-3 weeks       Elevated liver enzymes    Recent labs showed elevated liver enzymes. Patient is on Cabenuva for HIV management. Most recent value is improving compared to the last several months. -Plan to monitor liver enzymes closely in collaboration with Infectious Disease specialist.       Return in about 3 months (around 01/26/2023) for routine follow-up, repeat labs - 2 week BP follow-up with Ladona Ridgel .   Clayborne Dana, NP

## 2022-10-26 NOTE — Patient Instructions (Addendum)
Blood pressure is not at goal for age and co-morbidities.   Recommendations: Nifedipine 30 mg twice daily (set an alarm on your phone to remember to take it) - BP goal <130/80 - monitor and log blood pressures at home - check around the same time each day in a relaxed setting - Limit salt to <2000 mg/day - Follow DASH eating plan (heart healthy diet) - limit alcohol to 2 standard drinks per day for men and 1 per day for women - avoid tobacco products - get at least 2 hours of regular aerobic exercise weekly Patient aware of signs/symptoms requiring further/urgent evaluation. Follow-up 2-3 weeks      Thank you for choosing [facility] for your Primary Care needs. I am excited for the opportunity to partner with you to meet your health care goals. It was a pleasure meeting you today!  Information on diet, exercise, and health maintenance recommendations are listed below. This is information to help you be sure you are on track for optimal health and monitoring.   Please look over this and let us know if you have any questions or if you have completed any of the health maintenance outside of [facility] so that we can be sure your records are up to date.  ___________________________________________________________  MyChart:  For all urgent or time sensitive needs we ask that you please call the office to avoid delays. Our number is [phone]. MyChart is not constantly monitored and due to the large volume of messages a day, replies may take up to 72 business hours.  MyChart Policy: MyChart allows for you to see your visit notes, after visit summary, provider recommendations, lab and tests results, make an appointment, request refills, and contact your provider or the office for non-urgent questions or concerns. Providers are seeing patients during normal business hours and do not have built in time to review MyChart messages.  We ask that you allow a  minimum of 3 business days for responses to [employer]. For this reason, please do not send urgent requests through MyChart. Please call the office at [phone]. New and ongoing conditions may require a visit. We have virtual and in-person visits available for your convenience.  Complex MyChart concerns may require a visit. Your provider may request you schedule a virtual or in-person visit to ensure we are providing the best care possible. MyChart messages sent after 11:00 AM on Friday will not be received by the provider until Monday morning.    Lab and Test Results: You will receive your lab and test results on MyChart as soon as they are completed and results have been sent by the lab or testing facility. Due to this service, you will receive your results BEFORE your provider.  I review lab and test results each morning prior to seeing patients. Some results require collaboration with other providers to ensure you are receiving the most appropriate care. For this reason, we ask that you please allow a minimum of 3-5 business days from the time that ALL results have been received for your provider to receive and review lab and test results and contact you about these.  Most lab and test result comments from the provider will be sent through MyChart. Your provider may recommend changes to the plan of care, follow-up visits, repeat testing, ask questions, or request an office visit to discuss these results. You may reply directly to this message or call the office to provide information for  the provider or set up an appointment. In some instances, you will be called with test results and recommendations. Please let us know if this is preferred and we will make note of this in your chart to provide this for you.    If you have not heard a response to your lab or test results in 5 business days from all results returning to MyChart, please call the office to let us know. We ask that you please  avoid calling prior to this time unless there is an emergent concern. Due to high call volumes, this can delay the resulting process.  After Hours: For all non-emergency after hours needs, please call the office at [phone] and select the option to reach the on-call  service. On-call services are shared between multiple [facility] offices and therefore it will not be possible to speak directly with your provider. On-call providers may provide medical advice and recommendations, but are unable to provide refills for maintenance medications.  For all emergency or urgent medical needs after normal business hours, we recommend that you seek care at the closest Urgent Care or Emergency Department to ensure appropriate treatment in a timely manner.  [facility] has a 24 hour emergency room located on the ground floor for your convenience.   Urgent Concerns During the Business Day Providers are seeing patients from 8AM to 5PM with a busy schedule and are most often not able to respond to non-urgent calls until the end of the day or the next business day. If you should have URGENT concerns during the day, please call and speak to the nurse or schedule a same day appointment so that we can address your concern without delay.   Thank you, again, for choosing me as your health care partner. I appreciate your trust and look forward to learning more about you!   Lollie Marrow Reola Calkins, DNP, FNP-C  ___________________________________________________________  Health Maintenance Recommendations Screening Testing Mammogram Every 1-2 years based on history and risk factors Starting at age 64 Pap Smear Ages 21-39 every 3 years Ages 11-65 every 5 years with HPV testing More frequent testing may be required based on results and history Colon Cancer Screening Every 1-10 years based on test performed, risk factors, and history Starting at age 46 Bone Density Screening Every 2-10 years based on  history Starting at age 71 for women Recommendations for men differ based on medication usage, history, and risk factors AAA Screening One time ultrasound Men 79-27 years old who have ever smoked Lung Cancer Screening Low Dose Lung CT every 12 months Age 14-80 years with a 20 pack-year smoking history who still smoke or who have quit within the last 15 years  Screening Labs Routine  Labs: Complete Blood Count (CBC), Complete Metabolic Panel (CMP), Cholesterol (Lipid Panel) Every 6-12 months based on history and medications May be recommended more frequently based on current conditions or previous results Hemoglobin A1c Lab Every 3-12 months based on history and previous results Starting at age 4 or earlier with diagnosis of diabetes, high cholesterol, BMI >26, and/or risk factors Frequent monitoring for patients with diabetes to ensure blood sugar control Thyroid Panel  Every 6 months based on history, symptoms, and risk factors May be repeated more often if on medication HIV One time testing for all patients 12 and older May be repeated more frequently for patients with increased risk factors or exposure Hepatitis C One time testing for all patients 91 and older May be repeated more  frequently for patients with increased risk factors or exposure Gonorrhea, Chlamydia Every 12 months for all sexually active persons 13-24 years Additional monitoring may be recommended for those who are considered high risk or who have symptoms PSA Men 70-48 years old with risk factors Additional screening may be recommended from age 72-69 based on risk factors, symptoms, and history  Vaccine Recommendations Tetanus Booster All adults every 10 years Flu Vaccine All patients 6 months and older every year COVID Vaccine All patients 12 years and older Initial dosing with booster May recommend additional booster based on age and health history HPV Vaccine 2 doses all patients age 54-26 Dosing  may be considered for patients over 26 Shingles Vaccine (Shingrix) 2 doses all adults 50 years and older Pneumonia (Pneumovax 17) All adults 65 years and older May recommend earlier dosing based on health history Pneumonia (Prevnar 79) All adults 65 years and older Dosed 1 year after Pneumovax 23 Pneumonia (Prevnar 20) All adults 65 years and older (adults 19-64 with certain conditions or risk factors) 1 dose  For those who have not received Prevnar 13 vaccine previously   Additional Screening, Testing, and Vaccinations may be recommended on an individualized basis based on family history, health history, risk factors, and/or exposure.  __________________________________________________________  Diet Recommendations for All Patients  I recommend that all patients maintain a diet low in saturated fats, carbohydrates, and cholesterol. While this can be challenging at first, it is not impossible and small changes can make big differences.  Things to try: Decreasing the amount of soda, sweet tea, and/or juice to one or less per day and replace with water While water is always the first choice, if you do not like water you may consider adding a water additive without sugar to improve the taste other sugar free drinks Replace potatoes with a brightly colored vegetable  Use healthy oils, such as canola oil or olive oil, instead of butter or hard margarine Limit your bread intake to two pieces or less a day Replace regular pasta with low carb pasta options Bake, broil, or grill foods instead of frying Monitor portion sizes  Eat smaller, more frequent meals throughout the day instead of large meals  An important thing to remember is, if you love foods that are not great for your health, you don't have to give them up completely. Instead, allow these foods to be a reward when you have done well. Allowing yourself to still have special treats every once in a while is a nice way to tell  yourself thank you for working hard to keep yourself healthy.   Also remember that every day is a new day. If you have a bad day and "fall off the wagon", you can still climb right back up and keep moving along on your journey!  We have resources available to help you!  Some websites that may be helpful include: www.http://www.wall-moore.info/  Www.VeryWellFit.com _____________________________________________________________  Activity Recommendations for All Patients  I recommend that all adults get at least 20 minutes of moderate physical activity that elevates your heart rate at least 5 days out of the week.  Some examples include: Walking or jogging at a pace that allows you to carry on a conversation Cycling (stationary bike or outdoors) Water aerobics Yoga Weight lifting Dancing If physical limitations prevent you from putting stress on your joints, exercise in a pool or seated in a chair are excellent options.  Do determine your MAXIMUM heart rate for activity: 220 - YOUR  AGE = MAX Heart Rate   Remember! Do not push yourself too hard.  Start slowly and build up your pace, speed, weight, time in exercise, etc.  Allow your body to rest between exercise and get good sleep. You will need more water than normal when you are exerting yourself. Do not wait until you are thirsty to drink. Drink with a purpose of getting in at least 8, 8 ounce glasses of water a day plus more depending on how much you exercise and sweat.    If you begin to develop dizziness, chest pain, abdominal pain, jaw pain, shortness of breath, headache, vision changes, lightheadedness, or other concerning symptoms, stop the activity and allow your body to rest. If your symptoms are severe, seek emergency evaluation immediately. If your symptoms are concerning, but not severe, please let us know so that we can recommend further evaluation.

## 2022-10-26 NOTE — Assessment & Plan Note (Signed)
Recent labs showed elevated liver enzymes. Patient is on Cabenuva for HIV management. Most recent value is improving compared to the last several months. -Plan to monitor liver enzymes closely in collaboration with Infectious Disease specialist.

## 2022-10-26 NOTE — Assessment & Plan Note (Signed)
Blood pressure is not at goal for age and co-morbidities.   Recommendations: Nifedipine 30 mg twice daily (set an alarm on your phone to remember to take it) - BP goal <130/80 - monitor and log blood pressures at home - check around the same time each day in a relaxed setting - Limit salt to <2000 mg/day - Follow DASH eating plan (heart healthy diet) - limit alcohol to 2 standard drinks per day for men and 1 per day for women - avoid tobacco products - get at least 2 hours of regular aerobic exercise weekly Patient aware of signs/symptoms requiring further/urgent evaluation. Follow-up 2-3 weeks

## 2022-10-26 NOTE — Assessment & Plan Note (Signed)
Recent labs showed elevated cholesterol levels. -Encouraged dietary modifications, specifically reducing intake of trans fats and saturated fats. -Plan to recheck lipid panel in 3-6 months.

## 2022-11-09 ENCOUNTER — Other Ambulatory Visit: Payer: Medicaid Other

## 2022-11-10 ENCOUNTER — Other Ambulatory Visit (HOSPITAL_COMMUNITY): Payer: Self-pay

## 2022-11-15 ENCOUNTER — Other Ambulatory Visit: Payer: Medicaid Other

## 2022-11-16 ENCOUNTER — Other Ambulatory Visit: Payer: Medicaid Other

## 2022-11-29 ENCOUNTER — Other Ambulatory Visit (HOSPITAL_COMMUNITY): Payer: Self-pay

## 2022-11-30 ENCOUNTER — Other Ambulatory Visit (HOSPITAL_COMMUNITY): Payer: Self-pay

## 2022-11-30 ENCOUNTER — Ambulatory Visit: Payer: Medicaid Other

## 2022-12-05 ENCOUNTER — Other Ambulatory Visit: Payer: Self-pay

## 2022-12-06 ENCOUNTER — Telehealth: Payer: Self-pay

## 2022-12-06 NOTE — Telephone Encounter (Signed)
[facility] Patient Advocate Encounter  Patient's medication (Sunlenca & Renaldo Harrison) have been couriered to [facility] from [employer] [employer] [facility] and will be administered on the patient next office visit on 12/13/22.  Clearance Coots , CPhT Specialty Pharmacy Patient [facility] for [facility]ious Disease Phone: [phone] Fax:  [phone]

## 2022-12-09 NOTE — Progress Notes (Deleted)
HPI: PALMIRA STICKLE is a 32 y.o. female who presents to the [facility] pharmacy clinic for [place] administration.  Patient Active Problem List   Diagnosis Date Noted   Mixed hyperlipidemia 10/26/2022   Primary hypertension 10/26/2022   Elevated liver enzymes 10/26/2022   Uterine fibroid 08/16/2022   Nexplanon in place since 07/06/22 07/06/2022   History of poor fetal growth 06/28/2022   Vitamin D deficiency 07/22/2021   Nephrolithiasis 01/18/2021   History of pre-eclampsia 03/29/2019   HIV (human immunodeficiency virus infection) (HCC) 03/29/2019   CIN I (cervical intraepithelial neoplasia I) 11/28/2018   ASCUS with positive high risk HPV cervical 11/26/2018   History of shingles 03/22/2016    Patient's Medications  New Prescriptions   No medications on file  Previous Medications   CABOTEGRAVIR & RILPIVIRINE ER (CABENUVA) 600 & 900 MG/3ML INJECTION    Inject 1 kit into the muscle every 8 (eight) weeks.   ETONOGESTREL (NEXPLANON) 68 MG IMPL IMPLANT    1 each by Subdermal route once.   NIFEDIPINE (ADALAT CC) 30 MG 24 HR TABLET    Take 1 tablet (30 mg total) by mouth 2 (two) times daily.   SQ INJECTION LENACAPAVIR (SUNLENCA) 463.5 MG/1.5ML SQ INJECTION    Inject 3 mLs (927 mg total) into the skin every 6 (six) months. Administer each injection subcutaneously at separate sites in the abdomen (more or equal to 2 inches from the navel).  Modified Medications   No medications on file  Discontinued Medications   No medications on file    Allergies: No Known Allergies  Labs: Lab Results  Component Value Date   HIV1RNAQUANT <20 (H) 10/19/2022   HIV1RNAQUANT <20 (H) 06/21/2022   HIV1RNAQUANT Not Detected 05/17/2022   CD4TABS 343 (L) 10/19/2022   CD4TABS 348 (L) 03/16/2022   CD4TABS 283 (L) 10/20/2021    RPR and STI Lab Results  Component Value Date   LABRPR NON-REACTIVE 10/19/2022   LABRPR NON REACTIVE 06/28/2022   LABRPR Non Reactive 02/22/2022   LABRPR NON-REACTIVE  02/18/2021   LABRPR NON-REACTIVE 10/13/2020    STI Results GC GC CT CT  Latest Ref Rng & Units  NEGATIVE  NEGATIVE  06/28/2022 11:07 AM Negative   Negative    02/22/2022 10:49 AM Negative   Negative    08/18/2021  4:45 PM Negative   Negative    10/13/2020  9:03 AM Negative   Negative    03/26/2019 12:43 PM Negative   Negative    10/18/2018 12:00 AM Negative   Negative    05/25/2018 12:00 AM Negative   **POSITIVE**    03/28/2017 12:00 AM Negative   Negative    02/11/2017 12:00 AM Negative   Negative    07/07/2016 12:00 AM Negative   Negative    10/27/2015 12:00 AM Negative   Negative    02/07/2015 12:00 AM Negative   **POSITIVE**    05/19/2014 12:00 AM NG: Negative   CT: Negative    12/23/2013  3:05 PM  NEGATIVE   NEGATIVE   05/16/2013 12:00 AM NG: Negative   CT: Negative    10/22/2012  5:48 PM  NEGATIVE   NEGATIVE   02/12/2012 10:52 PM  NEGATIVE   NEGATIVE     Hepatitis B Lab Results  Component Value Date   HEPBSAB NON-REACTIVE 09/10/2020   HEPBSAG Negative 02/22/2022   HEPBCAB NON-REACTIVE 09/10/2020   Hepatitis C Lab Results  Component Value Date   HEPCAB NON-REACTIVE 09/10/2020   Hepatitis A Lab Results  Component Value Date   HAV NEG 12/09/2011   Lipids: Lab Results  Component Value Date   CHOL 224 (H) 10/19/2022   TRIG 86 10/19/2022   HDL 45 (L) 10/19/2022   CHOLHDL 5.0 (H) 10/19/2022   VLDL 9 10/27/2015   LDLCALC 160 (H) 10/19/2022    TARGET DATE: The 8th  Assessment: Jasamine presents today for her maintenance Cabenuva injections. Past injections were tolerated well without issues. Last HIV RNA was undetectable in August. Will defer today. She received a dose of her Sunlenca last month and is due for her next one in February.   Administered cabotegravir 600mg /90mL in left upper outer quadrant of the gluteal muscle. Administered rilpivirine 900 mg/3mL in the right upper outer quadrant of the gluteal muscle. No issues with injections. She will follow up in 2  months for next set of injections.  Plan: - Cabenuva injections administered - Next injections scheduled for 02/21/23 with Judeth Cornfield and *** - Call with any issues or questions  Anamari Galeas L. Piercen Covino, PharmD, BCIDP, AAHIVP, CPP Clinical Pharmacist Practitioner Infectious Diseases Clinical Pharmacist [facility]

## 2022-12-13 ENCOUNTER — Encounter: Payer: Self-pay | Admitting: Pharmacist

## 2022-12-13 ENCOUNTER — Ambulatory Visit: Payer: Medicaid Other | Admitting: Pharmacist

## 2022-12-13 DIAGNOSIS — B20 Human immunodeficiency virus [HIV] disease: Secondary | ICD-10-CM

## 2022-12-19 NOTE — Progress Notes (Unsigned)
HPI: Kimberly Bishop is a 32 y.o. female who presents to the [facility] pharmacy clinic for Kimberly Bishop administration.  Patient Active Problem List   Diagnosis Date Noted   Mixed hyperlipidemia 10/26/2022   Primary hypertension 10/26/2022   Elevated liver enzymes 10/26/2022   Uterine fibroid 08/16/2022   Nexplanon in place since 07/06/22 07/06/2022   History of poor fetal growth 06/28/2022   Vitamin D deficiency 07/22/2021   Nephrolithiasis 01/18/2021   History of pre-eclampsia 03/29/2019   HIV (human immunodeficiency virus infection) (HCC) 03/29/2019   CIN I (cervical intraepithelial neoplasia I) 11/28/2018   ASCUS with positive high risk HPV cervical 11/26/2018   History of shingles 03/22/2016    Patient's Medications  New Prescriptions   No medications on file  Previous Medications   CABOTEGRAVIR & RILPIVIRINE ER (CABENUVA) 600 & 900 MG/3ML INJECTION    Inject 1 kit into the muscle every 8 (eight) weeks.   ETONOGESTREL (NEXPLANON) 68 MG IMPL IMPLANT    1 each by Subdermal route once.   NIFEDIPINE (ADALAT CC) 30 MG 24 HR TABLET    Take 1 tablet (30 mg total) by mouth 2 (two) times daily.   SQ INJECTION LENACAPAVIR (SUNLENCA) 463.5 MG/1.5ML SQ INJECTION    Inject 3 mLs (927 mg total) into the skin every 6 (six) months. Administer each injection subcutaneously at separate sites in the abdomen (more or equal to 2 inches from the navel).  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)    History of cesarean delivery 04/29/2019   PCS for HIV VL>1000 in setting of IUGR, oligo, at 36 weeks     History of kidney stones    History of preterm delivery 04/29/2019   Intrauterine pregnancy at [redacted]w[redacted]d weeks gestation; IUGR 6%, oligohydramnios, HIV with viral load >1000    HIV (human  immunodeficiency virus infection) (HCC)    Hypertension    Immune deficiency disorder (HCC)    Infection    UTI   Kidney stones    Mixed hyperlipidemia 10/26/2022   Molluscum contagiosum 03/22/2016    Social History: Social History   Socioeconomic History   Marital status: Married    Spouse name: Not on file   Number of children: Not on file   Years of education: Not on file   Highest education level: Not on file  Occupational History    Comment: unemployed  Tobacco Use   Smoking status: Never   Smokeless tobacco: Never  Vaping Use   Vaping status: Never Used  Substance and Sexual Activity   Alcohol use: Not Currently    Comment: occasional -    Drug use: Not Currently    Types: Marijuana    Comment: last use 08/24/18   Sexual activity: Yes    Partners: Male    Birth control/protection: Implant, None    Comment: declined condoms 06/21/22  Other Topics Concern   Not on file  Social History Narrative   Not on file   Social Determinants of Health   Financial Resource Strain: Not on file  Food Insecurity: No Food Insecurity (06/28/2022)   Hunger Vital Sign    Worried About Running Out of Food in the Last Year: Never true    Ran Out of Food in the Last Year: Never true  Transportation  Needs: No Transportation Needs (06/28/2022)   PRAPARE - Administrator, Civil Service (Medical): No    Lack of Transportation (Non-Medical): No  Physical Activity: Not on file  Stress: Not on file  Social Connections: Not on file    Labs: Lab Results  Component Value Date   HIV1RNAQUANT <20 (H) 10/19/2022   HIV1RNAQUANT <20 (H) 06/21/2022   HIV1RNAQUANT Not Detected 05/17/2022   CD4TABS 343 (L) 10/19/2022   CD4TABS 348 (L) 03/16/2022   CD4TABS 283 (L) 10/20/2021    RPR and STI Lab Results  Component Value Date   LABRPR NON-REACTIVE 10/19/2022   LABRPR NON REACTIVE 06/28/2022   LABRPR Non Reactive 02/22/2022   LABRPR NON-REACTIVE 02/18/2021   LABRPR NON-REACTIVE  10/13/2020    STI Results GC GC CT CT  Latest Ref Rng & Units  NEGATIVE  NEGATIVE  06/28/2022 11:07 AM Negative   Negative    02/22/2022 10:49 AM Negative   Negative    08/18/2021  4:45 PM Negative   Negative    10/13/2020  9:03 AM Negative   Negative    03/26/2019 12:43 PM Negative   Negative    10/18/2018 12:00 AM Negative   Negative    05/25/2018 12:00 AM Negative   **POSITIVE**    03/28/2017 12:00 AM Negative   Negative    02/11/2017 12:00 AM Negative   Negative    07/07/2016 12:00 AM Negative   Negative    10/27/2015 12:00 AM Negative   Negative    02/07/2015 12:00 AM Negative   **POSITIVE**    05/19/2014 12:00 AM NG: Negative   CT: Negative    12/23/2013  3:05 PM  NEGATIVE   NEGATIVE   05/16/2013 12:00 AM NG: Negative   CT: Negative    10/22/2012  5:48 PM  NEGATIVE   NEGATIVE   02/12/2012 10:52 PM  NEGATIVE   NEGATIVE     Hepatitis B Lab Results  Component Value Date   HEPBSAB NON-REACTIVE 09/10/2020   HEPBSAG Negative 02/22/2022   HEPBCAB NON-REACTIVE 09/10/2020   Hepatitis C Lab Results  Component Value Date   HEPCAB NON-REACTIVE 09/10/2020   Hepatitis A Lab Results  Component Value Date   HAV NEG 12/09/2011   Lipids: Lab Results  Component Value Date   CHOL 224 (H) 10/19/2022   TRIG 86 10/19/2022   HDL 45 (L) 10/19/2022   CHOLHDL 5.0 (H) 10/19/2022   VLDL 9 10/27/2015   LDLCALC 160 (H) 10/19/2022    TARGET DATE:  The 8th of the month  Assessment: Kimberly Bishop presents today for their maintenance Cabenuva injections. Initial/past injections were tolerated well without issues. No problems with systemic effects of injections. Palpated knots on left and right sides prior to administrating medications; avoided knots during administration.   Administered cabotegravir 600mg /65mL in left upper outer quadrant of the gluteal muscle. Administered rilpivirine 900 mg/100mL in the right upper outer quadrant of the gluteal muscle. Monitored patient for 10 minutes after  injection. Injections were tolerated well without issue. Patient will follow up in 2 months for next injection. Will defer HIV RNA as it was recently assessed in August.  Due for flu and COVID vaccines which were both declined today.   Plan: - Cabenuva injections administered - Next injections scheduled for 12/10 with Judeth Cornfield and 2/4 with Cassie  - Call with any issues or questions  Margarite Gouge, PharmD, CPP, BCIDP, AAHIVP Clinical Pharmacist Practitioner Infectious Diseases Clinical Pharmacist [facility]

## 2022-12-21 ENCOUNTER — Ambulatory Visit (INDEPENDENT_AMBULATORY_CARE_PROVIDER_SITE_OTHER): Payer: Medicaid Other | Admitting: Pharmacist

## 2022-12-21 ENCOUNTER — Other Ambulatory Visit: Payer: Self-pay

## 2022-12-21 VITALS — Wt 151.6 lb

## 2022-12-21 DIAGNOSIS — B20 Human immunodeficiency virus [HIV] disease: Secondary | ICD-10-CM | POA: Diagnosis present

## 2022-12-21 MED ORDER — CABOTEGRAVIR & RILPIVIRINE ER 600 & 900 MG/3ML IM SUER
1.0000 | Freq: Once | INTRAMUSCULAR | Status: AC
Start: 2022-12-21 — End: 2022-12-21
  Administered 2022-12-21: 1 via INTRAMUSCULAR

## 2023-01-18 ENCOUNTER — Other Ambulatory Visit: Payer: Self-pay

## 2023-01-24 ENCOUNTER — Ambulatory Visit: Payer: Medicaid Other | Admitting: Family Medicine

## 2023-01-24 DIAGNOSIS — E782 Mixed hyperlipidemia: Secondary | ICD-10-CM

## 2023-01-24 DIAGNOSIS — I10 Essential (primary) hypertension: Secondary | ICD-10-CM

## 2023-02-02 ENCOUNTER — Other Ambulatory Visit: Payer: Self-pay

## 2023-02-02 ENCOUNTER — Other Ambulatory Visit (HOSPITAL_COMMUNITY): Payer: Self-pay

## 2023-02-02 NOTE — Progress Notes (Signed)
Specialty Pharmacy Refill Coordination Note  Kimberly Bishop is a 32 y.o. female assessed today regarding refills of clinic administered specialty medication(s) Cabotegravir & Rilpivirine   Clinic requested Courier to Provider Office   Delivery date: 02/14/23   Verified address: [facility] [address]  [place] [address]   Medication will be filled on 02/13/23.

## 2023-02-13 ENCOUNTER — Other Ambulatory Visit: Payer: Self-pay

## 2023-02-14 ENCOUNTER — Telehealth: Payer: Self-pay

## 2023-02-14 NOTE — Telephone Encounter (Signed)
 [facility] Patient Advocate Encounter  Patient's medications CABENUVA have been couriered to [facility] from [facility] and will be administered at the patients appointment on 02/21/23.  Kae Heller, CPhT Specialty Pharmacy Patient [facility] for Infectious Disease Phone: [phone] Fax:  [phone]

## 2023-02-21 ENCOUNTER — Ambulatory Visit: Payer: Self-pay | Admitting: Infectious Diseases

## 2023-02-21 NOTE — Progress Notes (Unsigned)
HPI: Kimberly Bishop is a 32 y.o. female who presents to the [facility] pharmacy clinic for [place] administration.  Patient Active Problem List   Diagnosis Date Noted   Mixed hyperlipidemia 10/26/2022   Primary hypertension 10/26/2022   Elevated liver enzymes 10/26/2022   Uterine fibroid 08/16/2022   Nexplanon in place since 07/06/22 07/06/2022   History of poor fetal growth 06/28/2022   Vitamin D deficiency 07/22/2021   Nephrolithiasis 01/18/2021   History of pre-eclampsia 03/29/2019   HIV (human immunodeficiency virus infection) (HCC) 03/29/2019   CIN I (cervical intraepithelial neoplasia I) 11/28/2018   ASCUS with positive high risk HPV cervical 11/26/2018   History of shingles 03/22/2016    Patient's Medications  New Prescriptions   No medications on file  Previous Medications   CABOTEGRAVIR & RILPIVIRINE ER (CABENUVA) 600 & 900 MG/3ML INJECTION    Inject 1 kit into the muscle every 8 (eight) weeks.   ETONOGESTREL (NEXPLANON) 68 MG IMPL IMPLANT    1 each by Subdermal route once.   NIFEDIPINE (ADALAT CC) 30 MG 24 HR TABLET    Take 1 tablet (30 mg total) by mouth 2 (two) times daily.   SQ INJECTION LENACAPAVIR (SUNLENCA) 463.5 MG/1.5ML SQ INJECTION    Inject 3 mLs (927 mg total) into the skin every 6 (six) months. Administer each injection subcutaneously at separate sites in the abdomen (more or equal to 2 inches from the navel).  Modified Medications   No medications on file  Discontinued Medications   No medications on file    Allergies: No Known Allergies  Labs: Lab Results  Component Value Date   HIV1RNAQUANT <20 (H) 10/19/2022   HIV1RNAQUANT <20 (H) 06/21/2022   HIV1RNAQUANT Not Detected 05/17/2022   CD4TABS 343 (L) 10/19/2022   CD4TABS 348 (L) 03/16/2022   CD4TABS 283 (L) 10/20/2021    RPR and STI Lab Results  Component Value Date   LABRPR NON-REACTIVE 10/19/2022   LABRPR NON REACTIVE 06/28/2022   LABRPR Non Reactive 02/22/2022   LABRPR NON-REACTIVE  02/18/2021   LABRPR NON-REACTIVE 10/13/2020    STI Results GC GC CT CT  Latest Ref Rng & Units  NEGATIVE  NEGATIVE  06/28/2022 11:07 AM Negative   Negative    02/22/2022 10:49 AM Negative   Negative    08/18/2021  4:45 PM Negative   Negative    10/13/2020  9:03 AM Negative   Negative    03/26/2019 12:43 PM Negative   Negative    10/18/2018 12:00 AM Negative   Negative    05/25/2018 12:00 AM Negative   **POSITIVE**    03/28/2017 12:00 AM Negative   Negative    02/11/2017 12:00 AM Negative   Negative    07/07/2016 12:00 AM Negative   Negative    10/27/2015 12:00 AM Negative   Negative    02/07/2015 12:00 AM Negative   **POSITIVE**    05/19/2014 12:00 AM NG: Negative   CT: Negative    12/23/2013  3:05 PM  NEGATIVE   NEGATIVE   05/16/2013 12:00 AM NG: Negative   CT: Negative    10/22/2012  5:48 PM  NEGATIVE   NEGATIVE   02/12/2012 10:52 PM  NEGATIVE   NEGATIVE     Hepatitis B Lab Results  Component Value Date   HEPBSAB NON-REACTIVE 09/10/2020   HEPBSAG Negative 02/22/2022   HEPBCAB NON-REACTIVE 09/10/2020   Hepatitis C Lab Results  Component Value Date   HEPCAB NON-REACTIVE 09/10/2020   Hepatitis A Lab Results  Component Value Date   HAV NEG 12/09/2011   Lipids: Lab Results  Component Value Date   CHOL 224 (H) 10/19/2022   TRIG 86 10/19/2022   HDL 45 (L) 10/19/2022   CHOLHDL 5.0 (H) 10/19/2022   VLDL 9 10/27/2015   LDLCALC 160 (H) 10/19/2022    TARGET DATE: The 8th  Assessment: Chole presents today for her maintenance Cabenuva injections. Past injections were tolerated well without issues. Last HIV RNA was undetectable in August. Will check again today. Due for Sunlenca dose in February and will schedule with [facility].   Administered cabotegravir 600mg /76mL in left upper outer quadrant of the gluteal muscle. Administered rilpivirine 900 mg/50mL in the right upper outer quadrant of the gluteal muscle. No issues with injections. She will follow up in 2 months for  next set of injections.  Steph in Feb for Cab + Sunlenca  Plan: - Cabenuva injections administered - Next injections scheduled for *** - Call with any issues or questions  Elah Avellino L. Asusena Sigley, PharmD, BCIDP, AAHIVP, CPP Clinical Pharmacist Practitioner Infectious Diseases Clinical Pharmacist [facility]

## 2023-02-22 ENCOUNTER — Ambulatory Visit: Payer: Medicaid Other | Admitting: Pharmacist

## 2023-02-22 ENCOUNTER — Other Ambulatory Visit: Payer: Self-pay

## 2023-02-22 DIAGNOSIS — B20 Human immunodeficiency virus [HIV] disease: Secondary | ICD-10-CM

## 2023-02-22 MED ORDER — CABOTEGRAVIR & RILPIVIRINE ER 600 & 900 MG/3ML IM SUER
1.0000 | Freq: Once | INTRAMUSCULAR | Status: AC
Start: 2023-02-22 — End: 2023-02-22
  Administered 2023-02-22: 1 via INTRAMUSCULAR

## 2023-03-01 ENCOUNTER — Other Ambulatory Visit (HOSPITAL_COMMUNITY): Payer: Self-pay

## 2023-03-28 ENCOUNTER — Other Ambulatory Visit (HOSPITAL_COMMUNITY): Payer: Self-pay

## 2023-03-28 ENCOUNTER — Other Ambulatory Visit: Payer: Self-pay

## 2023-03-28 NOTE — Progress Notes (Signed)
 Specialty Pharmacy Refill Coordination Note  Oneisha Ammons Perusse  is a 33 y.o. female assessed today regarding refills of clinic administered specialty medication(s) Cabotegravir  & Rilpivirine  (CABENUVA )   Clinic requested Courier to Provider Office   Delivery date: 04/11/23   Verified address: [address] [place] [address]   Medication will be filled on 04/10/23.

## 2023-04-10 ENCOUNTER — Other Ambulatory Visit: Payer: Self-pay

## 2023-04-11 ENCOUNTER — Telehealth: Payer: Self-pay

## 2023-04-11 NOTE — Telephone Encounter (Signed)
[facility] Patient Advocate Encounter  Patient's medications CABENUVA have been couriered to [facility] from [facility] and will be administered at the patients appointment on 04/19/23.  Kae Heller, CPhT Specialty Pharmacy Patient [facility] for Infectious Disease Phone: [phone] Fax:  [phone]

## 2023-04-18 ENCOUNTER — Ambulatory Visit: Payer: Medicaid Other | Admitting: Pharmacist

## 2023-04-19 ENCOUNTER — Ambulatory Visit (INDEPENDENT_AMBULATORY_CARE_PROVIDER_SITE_OTHER): Payer: Medicaid Other | Admitting: Infectious Diseases

## 2023-04-19 ENCOUNTER — Encounter: Payer: Self-pay | Admitting: Infectious Diseases

## 2023-04-19 ENCOUNTER — Other Ambulatory Visit: Payer: Self-pay

## 2023-04-19 VITALS — BP 154/94 | HR 84 | Resp 16 | Ht 62.0 in

## 2023-04-19 DIAGNOSIS — B20 Human immunodeficiency virus [HIV] disease: Secondary | ICD-10-CM

## 2023-04-19 DIAGNOSIS — Z23 Encounter for immunization: Secondary | ICD-10-CM | POA: Diagnosis not present

## 2023-04-19 MED ORDER — CABOTEGRAVIR & RILPIVIRINE ER 600 & 900 MG/3ML IM SUER
1.0000 | Freq: Once | INTRAMUSCULAR | Status: AC
Start: 2023-04-19 — End: 2023-04-19
  Administered 2023-04-19: 1 via INTRAMUSCULAR

## 2023-04-19 MED ORDER — LENACAPAVIR SODIUM 463.5 MG/1.5ML ~~LOC~~ SOLN
927.0000 mg | Freq: Once | SUBCUTANEOUS | Status: AC
Start: 2023-04-19 — End: 2023-04-19
  Administered 2023-04-19: 927 mg via SUBCUTANEOUS

## 2023-04-19 NOTE — Progress Notes (Signed)
 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 Started on some medications she thinks early adolescence but not quite clear. CD4 nadir < 50. No history of opportunistic infections to her knowledge aside from oral candidiasis.   Previous Regimens: Sustiva + Kaletra + Didanosine (peds [facility]) Darunavir  + Ritonavir  + Truvada  (2014) Reytaz + Ritonavir  + Truvada  (pregnancy 2014) Tivicay  + Truvada  2015  Tivicay  + Odefsey  (trying to get smaller pills to improve compliance) Biktarvy  Tivicay  + Truvada  (pregnancy 10-2018) Cabenuva  injections 02/2021 Sunlenca  added 10-2021  Genotypes: 3/23 - abacavir, zidovudine  resistance. No NNRTI or INSTI resistance   Subjective  Subjective:   Chief Complaint  Patient presents with   Follow-up      Discussed the use of AI scribe software for clinical note transcription with the patient, who gave verbal consent to proceed.  History of Present Illness   Kimberly Bishop  is a 32 year old female who presents for follow-up and Cabenuva  + Sunlenca .   She had an episode of acute shingles recurrence - very mild though. Her shingles symptoms only lasted 3 days and she kept the lesions covered until crusted . She did not need to take Valtrex , and the symptoms resolved on their own. She is concerned about transmitting the virus to her young son, who is not fully vaccinated. She has received one shot in June last year but never went back for the 2nd and would like to get this today.   Her immune system has improved significantly, with her T cell count rising to 350 as of August, compared to previous counts as low as 60 or 40. She is due for routine lab work today to check her viral load and T cell count.  She is currently using birth control, specifically an implant in her arm, to prevent further pregnancies. She has  two children and is not planning to have more. Her last Pap smear was in December 2023 and was normal, although she has had abnormal results in the past.           04/19/2023    3:32 PM  Depression screen PHQ 2/9  Decreased Interest 0  Down, Depressed, Hopeless 0  PHQ - 2 Score 0    Review of Systems  Constitutional:  Negative for chills and fever.  Respiratory:  Negative for cough.   Cardiovascular: Negative.   Gastrointestinal: Negative.   Genitourinary: Negative.   Musculoskeletal: Negative.   Skin:  Negative for rash.  Neurological: Negative.     Outpatient Medications Prior to Visit  Medication Sig Dispense Refill   cabotegravir  & rilpivirine  ER (CABENUVA ) 600 & 900 MG/3ML injection Inject 1 kit into the muscle every 8 (eight) weeks. 6 mL 6   etonogestrel  (NEXPLANON ) 68 MG IMPL implant 1 each by Subdermal route once.     NIFEdipine  (ADALAT  CC) 30 MG 24 hr tablet Take 1 tablet (30 mg total) by mouth 2 (two) times daily. 60 tablet 1   SQ injection lenacapavir (SUNLENCA ) 463.5 MG/1.5ML SQ injection Inject 3 mLs (927 mg total) into the skin every 6 (six) months. Administer each injection subcutaneously at separate sites in the abdomen (more or equal to 2 inches from the navel). 3 mL 1   No facility-administered medications prior to visit.     No Known Allergies  Social History   Tobacco Use  Smoking status: Never   Smokeless tobacco: Never  Vaping Use   Vaping status: Never Used  Substance Use Topics   Alcohol use: Not Currently    Comment: occasional -    Drug use: Not Currently    Types: Marijuana    Comment: last use 08/24/18    Social History   Substance and Sexual Activity  Sexual Activity Yes   Partners: Male   Birth control/protection: Implant, None   Comment: declined condoms 06/21/22     Objective    Objective:   Vitals:   04/19/23 1527 04/19/23 1530  BP:  (!) 154/94  Pulse:  84  Resp: 16 16  Height: 5' 2 (1.575 m) 5' 2 (1.575 m)     Body mass index is 27.73 kg/m.  Physical Exam Constitutional:      Appearance: Normal appearance. She is not ill-appearing.  HENT:     Mouth/Throat:     Mouth: Mucous membranes are moist.     Pharynx: Oropharynx is clear.  Eyes:     General: No scleral icterus. Cardiovascular:     Rate and Rhythm: Normal rate and regular rhythm.  Pulmonary:     Effort: Pulmonary effort is normal.  Neurological:     Mental Status: She is oriented to person, place, and time.  Psychiatric:        Mood and Affect: Mood normal.        Thought Content: Thought content normal.     Lab Results Lab Results  Component Value Date   WBC 5.5 10/19/2022   HGB 12.8 10/19/2022   HCT 38.5 10/19/2022   MCV 84.6 10/19/2022   PLT 307 10/19/2022    Lab Results  Component Value Date   CREATININE 0.99 (H) 10/19/2022   BUN 11 10/19/2022   NA 138 10/19/2022   K 4.0 10/19/2022   CL 110 10/19/2022   CO2 19 (L) 10/19/2022    Lab Results  Component Value Date   ALT 65 (H) 10/19/2022   AST 65 (H) 10/19/2022   ALKPHOS 134 (H) 07/06/2022   BILITOT 0.5 10/19/2022    Lab Results  Component Value Date   CHOL 224 (H) 10/19/2022   HDL 45 (L) 10/19/2022   LDLCALC 160 (H) 10/19/2022   TRIG 86 10/19/2022   CHOLHDL 5.0 (H) 10/19/2022   HIV 1 RNA Quant (Copies/mL)  Date Value  10/19/2022 <20 (H)  06/21/2022 <20 (H)  05/17/2022 Not Detected   CD4 T Cell Abs (/uL)  Date Value  10/19/2022 343 (L)  03/16/2022 348 (L)  10/20/2021 283 (L)       Assessment & Plan:     HIV -  Stable on Cabenuva . CD4 count improved to 350 from previous low levels. She has had undetectable viral loads. We will need to continue the sunlenca  with history of NNRTIs and concern over archived resistance with rilpivarine.  -Continue Cabenuva  q2m (administered today) -Continue sunlenca  Q36m (administered today)  -Check viral load and CD4 count. -Cervical cancer screening this year recommended   History of Shingles with  Recurrence -  Recent recurrence with shorter duration. First dose of vaccine received last June.  -Administer second dose of Shingles vaccine today.  Cervical Cancer Screening History of abnormal Pap smears, last Pap smear in December 2023 was normal. -Recommend another Pap smear this year to ensure continued normal results. -Can coordinate with visit back with me in August     Elevated Blood Pressure Reading -  She has history of pre-eclampsia.  BP > 150 systolic today. Will follow for ongoing elevated readings and if interventions needed.   Meds ordered this encounter  Medications   cabotegravir  & rilpivirine  ER (CABENUVA ) 600 & 900 MG/3ML injection 1 kit   lenacapavir (SUNLENCA ) 463.5 MG/1.5ML SQ injection 927 mg   Orders Placed This Encounter  Procedures   Varicella-zoster vaccine IM   HIV 1 RNA quant-no reflex-bld   T-helper cells (CD4) count   06/20/2023 next appointment for Q10m Cabenuva  TD 8th (even)    Corean Fireman, MSN, NP-C [facility] for Infectious Disease [facility] Pager: [phone] Office: [phone]   04/19/23  4:17 PM

## 2023-04-20 LAB — T-HELPER CELLS (CD4) COUNT (NOT AT ARMC)
CD4 % Helper T Cell: 29 % — ABNORMAL LOW (ref 33–65)
CD4 T Cell Abs: 477 /uL (ref 400–1790)

## 2023-04-21 LAB — HIV-1 RNA QUANT-NO REFLEX-BLD
HIV 1 RNA Quant: 39 {copies}/mL — ABNORMAL HIGH
HIV-1 RNA Quant, Log: 1.59 {Log} — ABNORMAL HIGH

## 2023-05-25 ENCOUNTER — Other Ambulatory Visit: Payer: Self-pay

## 2023-05-25 ENCOUNTER — Other Ambulatory Visit (HOSPITAL_COMMUNITY): Payer: Self-pay

## 2023-05-25 NOTE — Progress Notes (Signed)
 Specialty Pharmacy Refill Coordination Note  Kimberly Bishop is a 33 y.o. female assessed today regarding refills of clinic administered specialty medication(s) Cabotegravir & Rilpivirine [facility])   Clinic requested Courier to Provider Office   Delivery date: 06/13/23   Verified address: [address] [place] [address]   Medication will be filled on 06/12/23.

## 2023-06-12 ENCOUNTER — Other Ambulatory Visit: Payer: Self-pay

## 2023-06-13 ENCOUNTER — Telehealth: Payer: Self-pay

## 2023-06-13 NOTE — Telephone Encounter (Signed)
 [facility] Patient Advocate Encounter  Patient's medications Kimberly Bishop have been couriered to [facility] from [facility] and will be administered at the patients appointment on 06/20/23.  Clearance Coots, CPhT Specialty Pharmacy Patient [facility] for Infectious Disease Phone: [phone] Fax:  [phone]

## 2023-06-19 NOTE — Progress Notes (Unsigned)
 HPI: Kimberly Bishop is a 33 y.o. female who presents to the [facility] pharmacy clinic for Jeannette administration.  Patient Active Problem List   Diagnosis Date Noted   Mixed hyperlipidemia 10/26/2022   Primary hypertension 10/26/2022   Elevated liver enzymes 10/26/2022   Uterine fibroid 08/16/2022   Nexplanon in place since 07/06/22 07/06/2022   History of poor fetal growth 06/28/2022   Vitamin D deficiency 07/22/2021   Nephrolithiasis 01/18/2021   History of pre-eclampsia 03/29/2019   HIV (human immunodeficiency virus infection) (HCC) 03/29/2019   CIN I (cervical intraepithelial neoplasia I) 11/28/2018   ASCUS with positive high risk HPV cervical 11/26/2018   History of shingles 03/22/2016    Patient's Medications  New Prescriptions   No medications on file  Previous Medications   CABOTEGRAVIR & RILPIVIRINE ER (CABENUVA) 600 & 900 MG/3ML INJECTION    Inject 1 kit into the muscle every 8 (eight) weeks.   ETONOGESTREL (NEXPLANON) 68 MG IMPL IMPLANT    1 each by Subdermal route once.   NIFEDIPINE (ADALAT CC) 30 MG 24 HR TABLET    Take 1 tablet (30 mg total) by mouth 2 (two) times daily.   SQ INJECTION LENACAPAVIR (SUNLENCA) 463.5 MG/1.5ML SQ INJECTION    Inject 3 mLs (927 mg total) into the skin every 6 (six) months. Administer each injection subcutaneously at separate sites in the abdomen (more or equal to 2 inches from the navel).  Modified Medications   No medications on file  Discontinued Medications   No medications on file    Allergies: No Known Allergies  Labs: Lab Results  Component Value Date   HIV1RNAQUANT 39 (H) 04/19/2023   HIV1RNAQUANT <20 (H) 10/19/2022   HIV1RNAQUANT <20 (H) 06/21/2022   CD4TABS 477 04/19/2023   CD4TABS 343 (L) 10/19/2022   CD4TABS 348 (L) 03/16/2022    RPR and STI Lab Results  Component Value Date   LABRPR NON-REACTIVE 10/19/2022   LABRPR NON REACTIVE 06/28/2022   LABRPR Non Reactive 02/22/2022   LABRPR NON-REACTIVE 02/18/2021    LABRPR NON-REACTIVE 10/13/2020    STI Results GC GC CT CT  Latest Ref Rng & Units  NEGATIVE  NEGATIVE  06/28/2022 11:07 AM Negative   Negative    02/22/2022 10:49 AM Negative   Negative    08/18/2021  4:45 PM Negative   Negative    10/13/2020  9:03 AM Negative   Negative    03/26/2019 12:43 PM Negative   Negative    10/18/2018 12:00 AM Negative   Negative    05/25/2018 12:00 AM Negative   **POSITIVE**    03/28/2017 12:00 AM Negative   Negative    02/11/2017 12:00 AM Negative   Negative    07/07/2016 12:00 AM Negative   Negative    10/27/2015 12:00 AM Negative   Negative    02/07/2015 12:00 AM Negative   **POSITIVE**    05/19/2014 12:00 AM NG: Negative   CT: Negative    12/23/2013  3:05 PM  NEGATIVE   NEGATIVE   05/16/2013 12:00 AM NG: Negative   CT: Negative    10/22/2012  5:48 PM  NEGATIVE   NEGATIVE   02/12/2012 10:52 PM  NEGATIVE   NEGATIVE     Hepatitis B Lab Results  Component Value Date   HEPBSAB NON-REACTIVE 09/10/2020   HEPBSAG Negative 02/22/2022   HEPBCAB NON-REACTIVE 09/10/2020   Hepatitis C Lab Results  Component Value Date   HEPCAB NON-REACTIVE 09/10/2020   Hepatitis A Lab Results  Component  Value Date   HAV NEG 12/09/2011   Lipids: Lab Results  Component Value Date   CHOL 224 (H) 10/19/2022   TRIG 86 10/19/2022   HDL 45 (L) 10/19/2022   CHOLHDL 5.0 (H) 10/19/2022   VLDL 9 10/27/2015   LDLCALC 160 (H) 10/19/2022    TARGET DATE: The 8th of the month  Assessment: Kimberly Bishop presents today for her maintenance Cabenuva injections. She is also on Sunlenca. Past injections were tolerated well without issues. Last HIV RNA was 39 on 04/19/23, which is undetectable. Doing well with no issues today. Per managing provider, Rexene Alberts, NP, will update again at next visit. Next provider visit tentatively in August 2025.  Administered cabotegravir 600mg /67mL in left upper outer quadrant of the gluteal muscle. Administered rilpivirine 900 mg/2mL in the right upper  outer quadrant of the gluteal muscle. No issues with injections. Kimberly Bishop will follow up in 2 months for next set of injections.  Plan: - Cabenuva injections administered - Next injections scheduled for *** - Call with any issues or questions  Lora Paula, PharmD PGY-2 Infectious Diseases Pharmacy Resident [facility] 06/19/2023 6:13 PM

## 2023-06-20 ENCOUNTER — Ambulatory Visit (INDEPENDENT_AMBULATORY_CARE_PROVIDER_SITE_OTHER): Payer: Self-pay | Admitting: Pharmacist

## 2023-06-20 ENCOUNTER — Other Ambulatory Visit: Payer: Self-pay

## 2023-06-20 DIAGNOSIS — B20 Human immunodeficiency virus [HIV] disease: Secondary | ICD-10-CM | POA: Diagnosis present

## 2023-06-20 MED ORDER — CABOTEGRAVIR & RILPIVIRINE ER 600 & 900 MG/3ML IM SUER
1.0000 | Freq: Once | INTRAMUSCULAR | Status: AC
Start: 2023-06-20 — End: 2023-06-20
  Administered 2023-06-20: 1 via INTRAMUSCULAR

## 2023-08-08 ENCOUNTER — Other Ambulatory Visit: Payer: Self-pay

## 2023-08-08 ENCOUNTER — Other Ambulatory Visit (HOSPITAL_COMMUNITY): Payer: Self-pay

## 2023-08-08 ENCOUNTER — Other Ambulatory Visit: Payer: Self-pay | Admitting: Pharmacist

## 2023-08-08 DIAGNOSIS — B20 Human immunodeficiency virus [HIV] disease: Secondary | ICD-10-CM

## 2023-08-08 MED ORDER — CABOTEGRAVIR & RILPIVIRINE ER 600 & 900 MG/3ML IM SUER
1.0000 | INTRAMUSCULAR | 5 refills | Status: AC
  Filled 2023-08-08: qty 6, 60d supply, fill #0
  Filled 2023-10-09: qty 6, 60d supply, fill #1
  Filled 2023-12-15: qty 6, 60d supply, fill #2
  Filled 2024-01-29: qty 6, 60d supply, fill #3
  Filled 2024-04-08: qty 6, 60d supply, fill #4

## 2023-08-08 NOTE — Progress Notes (Signed)
 Specialty Pharmacy Refill Coordination Note  Kimberly Bishop  is a 33 y.o. female assessed today regarding refills of clinic administered specialty medication(s) Cabotegravir  & Rilpivirine  (CABENUVA )   Clinic requested Courier to Provider Office   Delivery date: 08/14/23   Verified address: [address] [employer] [address] [place] [address]   Medication will be filled on 08/11/23.

## 2023-08-10 ENCOUNTER — Other Ambulatory Visit: Payer: Self-pay

## 2023-08-11 ENCOUNTER — Other Ambulatory Visit: Payer: Self-pay

## 2023-08-14 ENCOUNTER — Telehealth: Payer: Self-pay

## 2023-08-14 NOTE — Telephone Encounter (Signed)
 [facility] Patient Advocate Encounter  Patient's medications Cabenuva  have been couriered to [facility] from [facility] and will be administered at the patients appointment on 08/23/23.  Roylene Corn, CPhT Specialty Pharmacy Patient [facility] for Infectious Disease Phone: [phone] Fax:  [phone]

## 2023-08-22 NOTE — Progress Notes (Deleted)
 HPI: Kimberly Bishop  is a 33 y.o. female who presents to the [facility] pharmacy clinic for Cabenuva  administration.  Patient Active Problem List   Diagnosis Date Noted   Mixed hyperlipidemia 10/26/2022   Primary hypertension 10/26/2022   Elevated liver enzymes 10/26/2022   Uterine fibroid 08/16/2022   Nexplanon  in place since 07/06/22 07/06/2022   History of poor fetal growth 06/28/2022   Vitamin D  deficiency 07/22/2021   Nephrolithiasis 01/18/2021   History of pre-eclampsia 03/29/2019   HIV (human immunodeficiency virus infection) (HCC) 03/29/2019   CIN I (cervical intraepithelial neoplasia I) 11/28/2018   ASCUS with positive high risk HPV cervical 11/26/2018   History of shingles 03/22/2016    Patient's Medications  New Prescriptions   No medications on file  Previous Medications   CABOTEGRAVIR  & RILPIVIRINE  ER (CABENUVA ) 600 & 900 MG/3ML INJECTION    Inject 1 kit into the muscle every 2 (two) months.   ETONOGESTREL  (NEXPLANON ) 68 MG IMPL IMPLANT    1 each by Subdermal route once.   NIFEDIPINE  (ADALAT  CC) 30 MG 24 HR TABLET    Take 1 tablet (30 mg total) by mouth 2 (two) times daily.   SQ INJECTION LENACAPAVIR (SUNLENCA ) 463.5 MG/1.5ML SQ INJECTION    Inject 3 mLs (927 mg total) into the skin every 6 (six) months. Administer each injection subcutaneously at separate sites in the abdomen (more or equal to 2 inches from the navel).  Modified Medications   No medications on file  Discontinued Medications   No medications on file    Allergies: No Known Allergies  Labs: Lab Results  Component Value Date   HIV1RNAQUANT 39 (H) 04/19/2023   HIV1RNAQUANT <20 (H) 10/19/2022   HIV1RNAQUANT <20 (H) 06/21/2022   CD4TABS 477 04/19/2023   CD4TABS 343 (L) 10/19/2022   CD4TABS 348 (L) 03/16/2022    RPR and STI Lab Results  Component Value Date   LABRPR NON-REACTIVE 10/19/2022   LABRPR NON REACTIVE 06/28/2022   LABRPR Non Reactive 02/22/2022   LABRPR NON-REACTIVE 02/18/2021    LABRPR NON-REACTIVE 10/13/2020    STI Results GC GC CT CT  Latest Ref Rng & Units  NEGATIVE  NEGATIVE  06/28/2022 11:07 AM Negative   Negative    02/22/2022 10:49 AM Negative   Negative    08/18/2021  4:45 PM Negative   Negative    10/13/2020  9:03 AM Negative   Negative    03/26/2019 12:43 PM Negative   Negative    10/18/2018 12:00 AM Negative   Negative    05/25/2018 12:00 AM Negative   **POSITIVE**    03/28/2017 12:00 AM Negative   Negative    02/11/2017 12:00 AM Negative   Negative    07/07/2016 12:00 AM Negative   Negative    10/27/2015 12:00 AM Negative   Negative    02/07/2015 12:00 AM Negative   **POSITIVE**    05/19/2014 12:00 AM NG: Negative   CT: Negative    12/23/2013  3:05 PM  NEGATIVE   NEGATIVE   05/16/2013 12:00 AM NG: Negative   CT: Negative    10/22/2012  5:48 PM  NEGATIVE   NEGATIVE   02/12/2012 10:52 PM  NEGATIVE   NEGATIVE     Hepatitis B Lab Results  Component Value Date   HEPBSAB NON-REACTIVE 09/10/2020   HEPBSAG Negative 02/22/2022   HEPBCAB NON-REACTIVE 09/10/2020   Hepatitis C Lab Results  Component Value Date   HEPCAB NON-REACTIVE 09/10/2020   Hepatitis A Lab Results  Component  Value Date   HAV NEG 12/09/2011   Lipids: Lab Results  Component Value Date   CHOL 224 (H) 10/19/2022   TRIG 86 10/19/2022   HDL 45 (L) 10/19/2022   CHOLHDL 5.0 (H) 10/19/2022   VLDL 9 10/27/2015   LDLCALC 160 (H) 10/19/2022    TARGET DATE: The 8th of the month  Assessment: Sinai presents today for her maintenance Cabenuva  injections. Past injections were tolerated well without issues. Last HIV RNA was 39 on 04/19/23. Doing well with no issues today. Will update this today.  Administered cabotegravir  600mg /38mL in left upper outer quadrant of the gluteal muscle. Administered rilpivirine  900 mg/3mL in the right upper outer quadrant of the gluteal muscle. No issues with injections. Elia will follow up in 2 months for next set of injections.  Plan: - Cabenuva   injections administered - Next injections scheduled for 10/17/23 with Cassie - Call with any issues or questions  Estela Held, PharmD PGY-2 Infectious Diseases Pharmacy Resident [facility] 08/22/2023 3:23 PM

## 2023-08-22 NOTE — Progress Notes (Unsigned)
 HPI: Kimberly Bishop  is a 33 y.o. female who presents to the [facility] pharmacy clinic for Cabenuva  administration.  Patient Active Problem List   Diagnosis Date Noted   Mixed hyperlipidemia 10/26/2022   Primary hypertension 10/26/2022   Elevated liver enzymes 10/26/2022   Uterine fibroid 08/16/2022   Nexplanon  in place since 07/06/22 07/06/2022   History of poor fetal growth 06/28/2022   Vitamin D  deficiency 07/22/2021   Nephrolithiasis 01/18/2021   History of pre-eclampsia 03/29/2019   HIV (human immunodeficiency virus infection) (HCC) 03/29/2019   CIN I (cervical intraepithelial neoplasia I) 11/28/2018   ASCUS with positive high risk HPV cervical 11/26/2018   History of shingles 03/22/2016    Patient's Medications  New Prescriptions   No medications on file  Previous Medications   CABOTEGRAVIR  & RILPIVIRINE  ER (CABENUVA ) 600 & 900 MG/3ML INJECTION    Inject 1 kit into the muscle every 2 (two) months.   ETONOGESTREL  (NEXPLANON ) 68 MG IMPL IMPLANT    1 each by Subdermal route once.   NIFEDIPINE  (ADALAT  CC) 30 MG 24 HR TABLET    Take 1 tablet (30 mg total) by mouth 2 (two) times daily.   SQ INJECTION LENACAPAVIR (SUNLENCA ) 463.5 MG/1.5ML SQ INJECTION    Inject 3 mLs (927 mg total) into the skin every 6 (six) months. Administer each injection subcutaneously at separate sites in the abdomen (more or equal to 2 inches from the navel).  Modified Medications   No medications on file  Discontinued Medications   No medications on file    Allergies: No Known Allergies  Labs: Lab Results  Component Value Date   HIV1RNAQUANT 39 (H) 04/19/2023   HIV1RNAQUANT <20 (H) 10/19/2022   HIV1RNAQUANT <20 (H) 06/21/2022   CD4TABS 477 04/19/2023   CD4TABS 343 (L) 10/19/2022   CD4TABS 348 (L) 03/16/2022    RPR and STI Lab Results  Component Value Date   LABRPR NON-REACTIVE 10/19/2022   LABRPR NON REACTIVE 06/28/2022   LABRPR Non Reactive 02/22/2022   LABRPR NON-REACTIVE 02/18/2021    LABRPR NON-REACTIVE 10/13/2020    STI Results GC GC CT CT  Latest Ref Rng & Units  NEGATIVE  NEGATIVE  06/28/2022 11:07 AM Negative   Negative    02/22/2022 10:49 AM Negative   Negative    08/18/2021  4:45 PM Negative   Negative    10/13/2020  9:03 AM Negative   Negative    03/26/2019 12:43 PM Negative   Negative    10/18/2018 12:00 AM Negative   Negative    05/25/2018 12:00 AM Negative   **POSITIVE**    03/28/2017 12:00 AM Negative   Negative    02/11/2017 12:00 AM Negative   Negative    07/07/2016 12:00 AM Negative   Negative    10/27/2015 12:00 AM Negative   Negative    02/07/2015 12:00 AM Negative   **POSITIVE**    05/19/2014 12:00 AM NG: Negative   CT: Negative    12/23/2013  3:05 PM  NEGATIVE   NEGATIVE   05/16/2013 12:00 AM NG: Negative   CT: Negative    10/22/2012  5:48 PM  NEGATIVE   NEGATIVE   02/12/2012 10:52 PM  NEGATIVE   NEGATIVE     Hepatitis B Lab Results  Component Value Date   HEPBSAB NON-REACTIVE 09/10/2020   HEPBSAG Negative 02/22/2022   HEPBCAB NON-REACTIVE 09/10/2020   Hepatitis C Lab Results  Component Value Date   HEPCAB NON-REACTIVE 09/10/2020   Hepatitis A Lab Results  Component  Value Date   HAV NEG 12/09/2011   Lipids: Lab Results  Component Value Date   CHOL 224 (H) 10/19/2022   TRIG 86 10/19/2022   HDL 45 (L) 10/19/2022   CHOLHDL 5.0 (H) 10/19/2022   VLDL 9 10/27/2015   LDLCALC 160 (H) 10/19/2022    TARGET DATE: 8th  Assessment: She presents today for her maintenance Cabenuva  injections. Past injections were tolerated well without issues. Last HIV RNA was 39 in February 2025. Doing well with no issues today.  Administered cabotegravir  600mg /66mL in left upper outer quadrant of the gluteal muscle. Administered rilpivirine  900 mg/3mL in the right upper outer quadrant of the gluteal muscle. No issues with injections. She will follow up in 2 months for next set of injections.  She noted a rash on her left ear that first appeared about  2 weeks ago. She has been treating it with Neosporin off and on. She was encouraged to try hydrocortisone cream.   She is due for 2/2 Shingrix  dose today. She is agreeable to receive this injection at this time. She is also eligible for HBV, HAV, and Menveo. She chooses to defer these immunizations at this time.  Plan: -HIV RNA collected  - Cabenuva  injections administered -Administered Shingrix  2/2 dose in the left deltoid - Next injections scheduled for 2 months - Call with any issues or questions  Tolu Zaccary Creech, PharmD [facility] PGY-1

## 2023-08-23 ENCOUNTER — Ambulatory Visit: Admitting: Pharmacist

## 2023-08-23 ENCOUNTER — Other Ambulatory Visit: Payer: Self-pay

## 2023-08-23 ENCOUNTER — Ambulatory Visit (INDEPENDENT_AMBULATORY_CARE_PROVIDER_SITE_OTHER): Admitting: Pharmacist

## 2023-08-23 DIAGNOSIS — B20 Human immunodeficiency virus [HIV] disease: Secondary | ICD-10-CM | POA: Diagnosis present

## 2023-08-23 DIAGNOSIS — Z23 Encounter for immunization: Secondary | ICD-10-CM

## 2023-08-23 DIAGNOSIS — Z113 Encounter for screening for infections with a predominantly sexual mode of transmission: Secondary | ICD-10-CM

## 2023-08-23 MED ORDER — CABOTEGRAVIR & RILPIVIRINE ER 600 & 900 MG/3ML IM SUER
1.0000 | Freq: Once | INTRAMUSCULAR | Status: AC
Start: 2023-08-23 — End: 2023-08-23
  Administered 2023-08-23: 1 via INTRAMUSCULAR

## 2023-10-02 ENCOUNTER — Other Ambulatory Visit (HOSPITAL_COMMUNITY): Payer: Self-pay

## 2023-10-09 ENCOUNTER — Other Ambulatory Visit: Payer: Self-pay | Admitting: Pharmacist

## 2023-10-09 ENCOUNTER — Other Ambulatory Visit: Payer: Self-pay

## 2023-10-09 ENCOUNTER — Other Ambulatory Visit (HOSPITAL_COMMUNITY): Payer: Self-pay

## 2023-10-09 DIAGNOSIS — B20 Human immunodeficiency virus [HIV] disease: Secondary | ICD-10-CM

## 2023-10-09 MED ORDER — SUNLENCA 463.5 MG/1.5ML ~~LOC~~ SOLN
927.0000 mg | SUBCUTANEOUS | 1 refills | Status: AC
  Filled 2023-10-09: qty 3, 180d supply, fill #0
  Filled 2024-04-12: qty 3, 180d supply, fill #1

## 2023-10-09 NOTE — Progress Notes (Signed)
 Specialty Pharmacy Refill Coordination Note  Kimberly Bishop  is a 33 y.o. female assessed today regarding refills of clinic administered specialty medication(s) Lenacapavir Sodium  (Sunlenca ); Cabotegravir  & Rilpivirine  (CABENUVA )   Clinic requested Courier to Provider Office   Delivery date: 10/16/23   Verified address: [address] [employer] [address] [place] [address]   Medication will be filled on 10/13/23.

## 2023-10-09 NOTE — Progress Notes (Signed)
 Specialty Pharmacy Refill Coordination Note  Malayah Demuro Bilski  is a 33 y.o. female assessed today regarding refills of clinic administered specialty medication(s) Cabotegravir  & Rilpivirine  (CABENUVA )   Clinic requested Courier to Provider Office   Delivery date: 10/12/23   Verified address: [address] [employer] [address] [place] [address]   Medication will be filled on 10/11/23.

## 2023-10-11 ENCOUNTER — Other Ambulatory Visit: Payer: Self-pay

## 2023-10-12 ENCOUNTER — Telehealth: Payer: Self-pay

## 2023-10-12 NOTE — Telephone Encounter (Signed)
 [facility] Patient Advocate Encounter  Patient's medications CABENUVA  have been couriered to [facility] from [facility] and will be administered at the patients appointment on 8/5/825.  Charmaine Sharps, CPhT Specialty Pharmacy Patient [facility] for Infectious Disease Phone: [phone] Fax:  [phone]

## 2023-10-16 ENCOUNTER — Telehealth: Payer: Self-pay

## 2023-10-16 NOTE — Progress Notes (Unsigned)
 HPI: Kimberly Bishop  is a 33 y.o. female who presents to the [facility] pharmacy clinic for Cabenuva  administration.  Patient Active Problem List   Diagnosis Date Noted   Mixed hyperlipidemia 10/26/2022   Primary hypertension 10/26/2022   Elevated liver enzymes 10/26/2022   Uterine fibroid 08/16/2022   Nexplanon  in place since 07/06/22 07/06/2022   History of poor fetal growth 06/28/2022   Vitamin D  deficiency 07/22/2021   Nephrolithiasis 01/18/2021   History of pre-eclampsia 03/29/2019   HIV (human immunodeficiency virus infection) (HCC) 03/29/2019   CIN I (cervical intraepithelial neoplasia I) 11/28/2018   ASCUS with positive high risk HPV cervical 11/26/2018   History of shingles 03/22/2016    Patient's Medications  New Prescriptions   No medications on file  Previous Medications   CABOTEGRAVIR  & RILPIVIRINE  ER (CABENUVA ) 600 & 900 MG/3ML INJECTION    Inject 1 kit into the muscle every 2 (two) months.   ETONOGESTREL  (NEXPLANON ) 68 MG IMPL IMPLANT    1 each by Subdermal route once.   NIFEDIPINE  (ADALAT  CC) 30 MG 24 HR TABLET    Take 1 tablet (30 mg total) by mouth 2 (two) times daily.   SQ INJECTION LENACAPAVIR (SUNLENCA ) 463.5 MG/1.5ML SQ INJECTION    Inject 3 mLs (927 mg total) into the skin every 6 (six) months. Administer each injection subcutaneously at separate sites in the abdomen (more or equal to 2 inches from the navel).  Modified Medications   No medications on file  Discontinued Medications   No medications on file    Allergies: No Known Allergies  Labs: Lab Results  Component Value Date   HIV1RNAQUANT 39 (H) 08/23/2023   HIV1RNAQUANT 39 (H) 04/19/2023   HIV1RNAQUANT <20 (H) 10/19/2022   CD4TABS 477 04/19/2023   CD4TABS 343 (L) 10/19/2022   CD4TABS 348 (L) 03/16/2022    RPR and STI Lab Results  Component Value Date   LABRPR NON-REACTIVE 10/19/2022   LABRPR NON REACTIVE 06/28/2022   LABRPR Non Reactive 02/22/2022   LABRPR NON-REACTIVE 02/18/2021    LABRPR NON-REACTIVE 10/13/2020    STI Results GC GC CT CT  Latest Ref Rng & Units  NEGATIVE  NEGATIVE  06/28/2022 11:07 AM Negative   Negative    02/22/2022 10:49 AM Negative   Negative    08/18/2021  4:45 PM Negative   Negative    10/13/2020  9:03 AM Negative   Negative    03/26/2019 12:43 PM Negative   Negative    10/18/2018 12:00 AM Negative   Negative    05/25/2018 12:00 AM Negative   **POSITIVE**    03/28/2017 12:00 AM Negative   Negative    02/11/2017 12:00 AM Negative   Negative    07/07/2016 12:00 AM Negative   Negative    10/27/2015 12:00 AM Negative   Negative    02/07/2015 12:00 AM Negative   **POSITIVE**    05/19/2014 12:00 AM NG: Negative   CT: Negative    12/23/2013  3:05 PM  NEGATIVE   NEGATIVE   05/16/2013 12:00 AM NG: Negative   CT: Negative    10/22/2012  5:48 PM  NEGATIVE   NEGATIVE   02/12/2012 10:52 PM  NEGATIVE   NEGATIVE     Hepatitis B Lab Results  Component Value Date   HEPBSAB NON-REACTIVE 09/10/2020   HEPBSAG Negative 02/22/2022   HEPBCAB NON-REACTIVE 09/10/2020   Hepatitis C Lab Results  Component Value Date   HEPCAB NON-REACTIVE 09/10/2020   Hepatitis A Lab Results  Component  Value Date   HAV NEG 12/09/2011   Lipids: Lab Results  Component Value Date   CHOL 224 (H) 10/19/2022   TRIG 86 10/19/2022   HDL 45 (L) 10/19/2022   CHOLHDL 5.0 (H) 10/19/2022   VLDL 9 10/27/2015   LDLCALC 160 (H) 10/19/2022    TARGET DATE: Cabenuva - the 8th on even months Sunlenca  - February and August 9th  Assessment: Corah presents today for her maintenance Cabenuva  and Sunlenca  injections. Cabenuva  injections in June were tolerated well without issues. Last HIV RNA was <50 in June. Last Sunlenca  injections were on 04/19/23 on her right side. She tolerated well without any issues. She has a small nodule on her abdomen on the left lower side that remains from her previous injections. Made sure to avoid that site today. It is not painful. Cautioned not to rub  the injection sites and that a small amount of burning is normal.    Administered lenacapavir 463.5/1.73mL subcutaneously in upper left abdomen and lower left abdomen. Monitored patient for 10 minutes after injection. Injections were tolerated well without issue. Will follow up again in 6 months for next set of injections.   Administered cabotegravir  600mg /57mL in left upper outer quadrant of the gluteal muscle. Administered rilpivirine  900 mg/3mL in the right upper outer quadrant of the gluteal muscle. No issues with injections. She will follow up in 2 months for next set of injections.  Plan: - Cabenuva  injections administered - Sunlenca  injections administered - Next Cabenuva  inejctions scheduled for 12/26/23 with Corean and 02/20/24 with me - Next Sunlenca  injections due in February 2026 - Call with any issues or questions  Demarrius Guerrero L. Deshara Rossi, PharmD, BCIDP, AAHIVP, CPP Clinical Pharmacist Practitioner - Infectious Diseases Clinical Pharmacist Lead - Specialty Pharmacy [facility] for Infectious Disease

## 2023-10-16 NOTE — Telephone Encounter (Signed)
 [facility] Patient Advocate Encounter  Patient's medications SUNLENCA  have been couriered to [facility] from [facility] and will be administered at the patients appointment on 10/17/23.  Arland Hutchinson, CPhT Specialty Pharmacy Patient [facility] for Infectious Disease Phone: [phone] Fax:  [phone]

## 2023-10-17 ENCOUNTER — Other Ambulatory Visit: Payer: Self-pay

## 2023-10-17 ENCOUNTER — Ambulatory Visit: Admitting: Pharmacist

## 2023-10-17 DIAGNOSIS — B20 Human immunodeficiency virus [HIV] disease: Secondary | ICD-10-CM

## 2023-10-17 MED ORDER — CABOTEGRAVIR & RILPIVIRINE ER 600 & 900 MG/3ML IM SUER
1.0000 | Freq: Once | INTRAMUSCULAR | Status: AC
Start: 2023-10-17 — End: 2023-10-17
  Administered 2023-10-17: 1 via INTRAMUSCULAR

## 2023-10-17 MED ORDER — LENACAPAVIR SODIUM 463.5 MG/1.5ML ~~LOC~~ SOLN
927.0000 mg | Freq: Once | SUBCUTANEOUS | Status: AC
Start: 2023-10-17 — End: 2023-10-17
  Administered 2023-10-17: 927 mg via SUBCUTANEOUS

## 2023-10-19 ENCOUNTER — Other Ambulatory Visit (HOSPITAL_COMMUNITY): Payer: Self-pay

## 2023-12-15 ENCOUNTER — Other Ambulatory Visit: Payer: Self-pay

## 2023-12-15 ENCOUNTER — Other Ambulatory Visit (HOSPITAL_COMMUNITY): Payer: Self-pay

## 2023-12-15 NOTE — Progress Notes (Signed)
 Specialty Pharmacy Refill Coordination Note  Kimberly Bishop  is a 33 y.o. female assessed today regarding refills of clinic administered specialty medication(s) Cabotegravir  & Rilpivirine  (CABENUVA )   Clinic requested Courier to Provider Office   Delivery date: 12/25/23   Verified address: [address] [employer] [address] [place] [address]   Medication will be filled on 12/22/23.

## 2023-12-22 ENCOUNTER — Other Ambulatory Visit: Payer: Self-pay

## 2023-12-25 ENCOUNTER — Telehealth: Payer: Self-pay

## 2023-12-25 NOTE — Telephone Encounter (Signed)
 [facility] Patient Advocate Encounter  Patient's medications Cabenuva  have been couriered to [facility] from [facility] and will be administered at the patients appointment on 12/27/23.  Arland Hutchinson, CPhT Specialty Pharmacy Patient [facility] for Infectious Disease Phone: [phone] Fax:  [phone]

## 2023-12-26 ENCOUNTER — Encounter: Admitting: Infectious Diseases

## 2023-12-27 ENCOUNTER — Ambulatory Visit (INDEPENDENT_AMBULATORY_CARE_PROVIDER_SITE_OTHER): Admitting: Infectious Diseases

## 2023-12-27 ENCOUNTER — Other Ambulatory Visit: Payer: Self-pay

## 2023-12-27 ENCOUNTER — Encounter: Payer: Self-pay | Admitting: Infectious Diseases

## 2023-12-27 VITALS — BP 136/88 | HR 80 | Temp 99.7°F | Resp 16 | Wt 150.0 lb

## 2023-12-27 DIAGNOSIS — B20 Human immunodeficiency virus [HIV] disease: Secondary | ICD-10-CM

## 2023-12-27 DIAGNOSIS — Z8619 Personal history of other infectious and parasitic diseases: Secondary | ICD-10-CM | POA: Diagnosis not present

## 2023-12-27 DIAGNOSIS — Z79899 Other long term (current) drug therapy: Secondary | ICD-10-CM

## 2023-12-27 MED ORDER — CABOTEGRAVIR & RILPIVIRINE ER 600 & 900 MG/3ML IM SUER
1.0000 | Freq: Once | INTRAMUSCULAR | Status: AC
  Administered 2023-12-27: 1 via INTRAMUSCULAR

## 2023-12-27 NOTE — Progress Notes (Signed)
 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 Started on some medications she thinks early adolescence but not quite clear. CD4 nadir < 50. No history of opportunistic infections to her knowledge aside from oral candidiasis.   Previous Regimens: Sustiva + Kaletra + Didanosine ([facility]) Darunavir  + Ritonavir  + Truvada  (2014) Reytaz + Ritonavir  + Truvada  (pregnancy 2014) Tivicay  + Truvada  2015  Tivicay  + Odefsey  (trying to get smaller pills to improve compliance) Biktarvy  Tivicay  + Truvada  (pregnancy 10-2018) Cabenuva  injections 02/2021 Sunlenca  added 10-2021  Genotypes: 3/23 - abacavir, zidovudine  resistance. No NNRTI or INSTI resistance   Subjective  Subjective:   Chief Complaint  Patient presents with   Follow-up    B20       Discussed the use of AI scribe software for clinical note transcription with the patient, who gave verbal consent to proceed.  History of Present Illness   Kimberly Bishop  is a 33 year old female with HIV who presents for routine follow-up and Cabenuva  administration.  She last saw her provider in February of this year and was doing well on a regimen of Cabenuva  every two months and Sunlenca  every six months, with her last dose of Sunlenca  administered in August 2025. She is due for her next Sunlenca  dose in February 2026.  Her viral load in June 2025 was 39, and she is due for a recheck of her viral load, CD4 count, CBC, chemistry panel, and lipid panel. There have been no hospital stays or changes in her medical history since her last visit.  Her last Pap smear was conducted in February 2023 and was negative. She is not currently using any contraception and has not reported any pregnancies.           04/19/2023    3:32 PM  Depression screen PHQ 2/9  Decreased Interest 0  Down, Depressed,  Hopeless 0  PHQ - 2 Score 0    Review of Systems  Constitutional:  Negative for chills and fever.  Respiratory:  Negative for cough.   Cardiovascular: Negative.   Gastrointestinal: Negative.   Genitourinary: Negative.   Musculoskeletal: Negative.   Skin:  Negative for rash.  Neurological: Negative.     Outpatient Medications Prior to Visit  Medication Sig Dispense Refill   cabotegravir  & rilpivirine  ER (CABENUVA ) 600 & 900 MG/3ML injection Inject 1 kit into the muscle every 2 (two) months. 6 mL 5   etonogestrel  (NEXPLANON ) 68 MG IMPL implant 1 each by Subdermal route once.     SQ injection lenacapavir (SUNLENCA ) 463.5 MG/1.5ML SQ injection Inject 3 mLs (927 mg total) into the skin every 6 (six) months. Administer each injection subcutaneously at separate sites in the abdomen (more or equal to 2 inches from the navel). 3 mL 1   NIFEdipine  (ADALAT  CC) 30 MG 24 hr tablet Take 1 tablet (30 mg total) by mouth 2 (two) times daily. (Patient not taking: Reported on 12/27/2023) 60 tablet 1   No facility-administered medications prior to visit.     No Known Allergies  Social History   Tobacco Use   Smoking status: Never   Smokeless tobacco: Never  Vaping Use   Vaping status: Never Used  Substance Use Topics   Alcohol use: Not Currently    Comment: occasional -    Drug use: Not Currently  Types: Marijuana    Comment: last use 08/24/18    Social History   Substance and Sexual Activity  Sexual Activity Yes   Partners: Male   Birth control/protection: Implant, None   Comment: declined condoms 06/21/22     Objective    Objective:   Vitals:   12/27/23 0933  BP: 136/88  Pulse: 80  Resp: 16  Temp: 99.7 F (37.6 C)  TempSrc: Temporal  SpO2: 97%  Weight: 150 lb (68 kg)    Body mass index is 27.44 kg/m.  Physical Exam Constitutional:      Appearance: Normal appearance. She is not ill-appearing.  HENT:     Mouth/Throat:     Mouth: Mucous membranes are moist.      Pharynx: Oropharynx is clear.  Eyes:     General: No scleral icterus. Cardiovascular:     Rate and Rhythm: Normal rate and regular rhythm.  Pulmonary:     Effort: Pulmonary effort is normal.  Neurological:     Mental Status: She is oriented to person, place, and time.  Psychiatric:        Mood and Affect: Mood normal.        Thought Content: Thought content normal.     Lab Results Lab Results  Component Value Date   WBC 5.5 10/19/2022   HGB 12.8 10/19/2022   HCT 38.5 10/19/2022   MCV 84.6 10/19/2022   PLT 307 10/19/2022    Lab Results  Component Value Date   CREATININE 0.99 (H) 10/19/2022   BUN 11 10/19/2022   NA 138 10/19/2022   K 4.0 10/19/2022   CL 110 10/19/2022   CO2 19 (L) 10/19/2022    Lab Results  Component Value Date   ALT 65 (H) 10/19/2022   AST 65 (H) 10/19/2022   ALKPHOS 134 (H) 07/06/2022   BILITOT 0.5 10/19/2022    Lab Results  Component Value Date   CHOL 224 (H) 10/19/2022   HDL 45 (L) 10/19/2022   LDLCALC 160 (H) 10/19/2022   TRIG 86 10/19/2022   CHOLHDL 5.0 (H) 10/19/2022   HIV 1 RNA Quant  Date Value  08/23/2023 39 copies/mL (H)  04/19/2023 39 Copies/mL (H)  10/19/2022 <20 Copies/mL (H)   CD4 T Cell Abs (/uL)  Date Value  04/19/2023 477  10/19/2022 343 (L)  03/16/2022 348 (L)       Assessment & Plan:     HIV infection - HIV infection is managed with current antiretroviral regimen. Last viral load in June was 39, indicating effective viral suppression. She is on a regimen of Cabenuva  every two months and Sunlenca  every six months, with the last Sunlenca  dose administered in August. No hospitalizations or changes in medical history since the last visit in February.  - Administer Cabenuva  injection today. - Next injection is scheduled for December 2026 with TD  - Order viral load, CD4 count, CBC, chemistry panel, and lipid panel. - Monitor liver function with routine labs.   H/O Shingles -  she describes recurrent rash despite  the shingles vaccines. Though they are not as severe/painful and recently resolved on own.  I would like to test for HSV 1/2 and Zoster PCR next time this comes up so we can ensure the etiology of the rash. If it turns out to be more HSV would explain why she was not more protected with Shingrix .    Healthcare maintenance -  Pap smear Dec 2026 due for routine care  Deferred vaccines today  Meds ordered this encounter  Medications   cabotegravir  & rilpivirine  ER (CABENUVA ) 600 & 900 MG/3ML injection 1 kit   Orders Placed This Encounter  Procedures   HIV 1 RNA quant-no reflex-bld   COMPLETE METABOLIC PANEL WITHOUT GFR   CBC w/Diff   RPR   T-helper cells (CD4) count   02/13/2024  next appointment for Q68m Cabenuva  TD 8th (even) Sunlenca  due in February 2026  Corean Fireman, MSN, NP-C [facility] Pager: [phone] Office: [phone]   12/27/23  10:39 AM

## 2023-12-27 NOTE — Patient Instructions (Signed)
 Nice to see you   Please stop by the lab on your way out.   If you get another rash - you should come by to see if we can get you in to swab the rash for DNA - can see if it's either zoster (shingles) or H Simplex Virus. Eitherway I would keep the rash completely covered until the bumps all crust and dry out with new skin growing to prevent sharing with the kiddos.   Next shot appt: 02/13/2024

## 2023-12-28 LAB — T-HELPER CELLS (CD4) COUNT (NOT AT ARMC)
CD4 % Helper T Cell: 28 % — ABNORMAL LOW (ref 33–65)
CD4 T Cell Abs: 422 /uL (ref 400–1790)

## 2023-12-29 LAB — CBC WITH DIFFERENTIAL/PLATELET
Absolute Lymphocytes: 1666 {cells}/uL (ref 850–3900)
Absolute Monocytes: 468 {cells}/uL (ref 200–950)
Basophils Absolute: 43 {cells}/uL (ref 0–200)
Basophils Relative: 0.5 %
Eosinophils Absolute: 179 {cells}/uL (ref 15–500)
Eosinophils Relative: 2.1 %
HCT: 42.1 % (ref 35.0–45.0)
Hemoglobin: 13.9 g/dL (ref 11.7–15.5)
MCH: 30.2 pg (ref 27.0–33.0)
MCHC: 33 g/dL (ref 32.0–36.0)
MCV: 91.3 fL (ref 80.0–100.0)
MPV: 11.2 fL (ref 7.5–12.5)
Monocytes Relative: 5.5 %
Neutro Abs: 6146 {cells}/uL (ref 1500–7800)
Neutrophils Relative %: 72.3 %
Platelets: 274 Thousand/uL (ref 140–400)
RBC: 4.61 Million/uL (ref 3.80–5.10)
RDW: 12.9 % (ref 11.0–15.0)
Total Lymphocyte: 19.6 %
WBC: 8.5 Thousand/uL (ref 3.8–10.8)

## 2023-12-29 LAB — COMPLETE METABOLIC PANEL WITHOUT GFR
AG Ratio: 1.3 (calc) (ref 1.0–2.5)
ALT: 19 U/L (ref 6–29)
AST: 17 U/L (ref 10–30)
Albumin: 4.5 g/dL (ref 3.6–5.1)
Alkaline phosphatase (APISO): 132 U/L — ABNORMAL HIGH (ref 31–125)
BUN: 9 mg/dL (ref 7–25)
CO2: 21 mmol/L (ref 20–32)
Calcium: 9.7 mg/dL (ref 8.6–10.2)
Chloride: 110 mmol/L (ref 98–110)
Creat: 0.89 mg/dL (ref 0.50–0.97)
Globulin: 3.4 g/dL (ref 1.9–3.7)
Glucose, Bld: 85 mg/dL (ref 65–99)
Potassium: 3.9 mmol/L (ref 3.5–5.3)
Sodium: 139 mmol/L (ref 135–146)
Total Bilirubin: 0.7 mg/dL (ref 0.2–1.2)
Total Protein: 7.9 g/dL (ref 6.1–8.1)

## 2023-12-29 LAB — RPR: RPR Ser Ql: NONREACTIVE

## 2023-12-29 LAB — HIV-1 RNA QUANT-NO REFLEX-BLD
HIV 1 RNA Quant: NOT DETECTED {copies}/mL
HIV-1 RNA Quant, Log: NOT DETECTED {Log_copies}/mL

## 2024-01-04 ENCOUNTER — Ambulatory Visit: Payer: Self-pay | Admitting: Infectious Diseases

## 2024-01-29 ENCOUNTER — Other Ambulatory Visit: Payer: Self-pay

## 2024-01-29 NOTE — Progress Notes (Signed)
 Specialty Pharmacy Refill Coordination Note  Kimberly Bishop  is a 33 y.o. female assessed today regarding refills of clinic administered specialty medication(s) Cabotegravir  & Rilpivirine  (CABENUVA )   Clinic requested Courier to Provider Office   Delivery date: 02/05/24   Verified address: [address] [place] [address]   Medication will be filled on 02/02/24.

## 2024-02-02 ENCOUNTER — Other Ambulatory Visit: Payer: Self-pay

## 2024-02-02 NOTE — Progress Notes (Signed)
 Refill too soon until 11.24.25.

## 2024-02-05 ENCOUNTER — Other Ambulatory Visit: Payer: Self-pay

## 2024-02-05 ENCOUNTER — Other Ambulatory Visit (HOSPITAL_COMMUNITY): Payer: Self-pay

## 2024-02-06 ENCOUNTER — Encounter: Payer: Self-pay | Admitting: Pharmacist

## 2024-02-06 ENCOUNTER — Telehealth: Payer: Self-pay

## 2024-02-06 NOTE — Telephone Encounter (Signed)
 [facility] Patient Advocate Encounter  Patient's medications CABENUVA  have been couriered to [facility] from [facility] and will be administered at the patients appointment on 02/20/24.  Charmaine Sharps, CPhT Specialty Pharmacy Patient [facility] for Infectious Disease Phone: [phone] Fax:  [phone]

## 2024-02-13 ENCOUNTER — Ambulatory Visit: Admitting: Pharmacist

## 2024-02-19 NOTE — Progress Notes (Unsigned)
 HPI: Kimberly Bishop  is a 33 y.o. female who presents to the [facility] pharmacy clinic for Cabenuva  administration.  Referring ID Provider: Corean Fireman, NP  Patient Active Problem List   Diagnosis Date Noted   Mixed hyperlipidemia 10/26/2022   Primary hypertension 10/26/2022   Elevated liver enzymes 10/26/2022   Uterine fibroid 08/16/2022   Nexplanon  in place since 07/06/22 07/06/2022   History of poor fetal growth 06/28/2022   Vitamin D  deficiency 07/22/2021   Nephrolithiasis 01/18/2021   History of pre-eclampsia 03/29/2019   HIV (human immunodeficiency virus infection) (HCC) 03/29/2019   CIN I (cervical intraepithelial neoplasia I) 11/28/2018   ASCUS with positive high risk HPV cervical 11/26/2018   History of shingles 03/22/2016    Patient's Medications  New Prescriptions   No medications on file  Previous Medications   CABOTEGRAVIR  & RILPIVIRINE  ER (CABENUVA ) 600 & 900 MG/3ML INJECTION    Inject 1 kit into the muscle every 2 (two) months.   ETONOGESTREL  (NEXPLANON ) 68 MG IMPL IMPLANT    1 each by Subdermal route once.   NIFEDIPINE  (ADALAT  CC) 30 MG 24 HR TABLET    Take 1 tablet (30 mg total) by mouth 2 (two) times daily.   SQ INJECTION LENACAPAVIR (SUNLENCA ) 463.5 MG/1.5ML SQ INJECTION    Inject 3 mLs (927 mg total) into the skin every 6 (six) months. Administer each injection subcutaneously at separate sites in the abdomen (more or equal to 2 inches from the navel).  Modified Medications   No medications on file  Discontinued Medications   No medications on file    Allergies: No Known Allergies  Labs: Lab Results  Component Value Date   HIV1RNAQUANT NOT DETECTED 12/27/2023   HIV1RNAQUANT 39 (H) 08/23/2023   HIV1RNAQUANT 39 (H) 04/19/2023   CD4TABS 422 12/27/2023   CD4TABS 477 04/19/2023   CD4TABS 343 (L) 10/19/2022    RPR and STI Lab Results  Component Value Date   LABRPR NON-REACTIVE 12/27/2023   LABRPR NON-REACTIVE 10/19/2022   LABRPR NON REACTIVE  06/28/2022   LABRPR Non Reactive 02/22/2022   LABRPR NON-REACTIVE 02/18/2021    STI Results GC GC CT CT  Latest Ref Rng & Units  NEGATIVE  NEGATIVE  06/28/2022 11:07 AM Negative   Negative    02/22/2022 10:49 AM Negative   Negative    08/18/2021  4:45 PM Negative   Negative    10/13/2020  9:03 AM Negative   Negative    03/26/2019 12:43 PM Negative   Negative    10/18/2018 12:00 AM Negative   Negative    05/25/2018 12:00 AM Negative   **POSITIVE**    03/28/2017 12:00 AM Negative   Negative    02/11/2017 12:00 AM Negative   Negative    07/07/2016 12:00 AM Negative   Negative    10/27/2015 12:00 AM Negative   Negative    02/07/2015 12:00 AM Negative   **POSITIVE**    05/19/2014 12:00 AM NG: Negative   CT: Negative    12/23/2013  3:05 PM  NEGATIVE   NEGATIVE   05/16/2013 12:00 AM NG: Negative   CT: Negative    10/22/2012  5:48 PM  NEGATIVE   NEGATIVE   02/12/2012 10:52 PM  NEGATIVE   NEGATIVE     Hepatitis B Lab Results  Component Value Date   HEPBSAB NON-REACTIVE 09/10/2020   HEPBSAG Negative 02/22/2022   HEPBCAB NON-REACTIVE 09/10/2020   Hepatitis C Lab Results  Component Value Date   HEPCAB NON-REACTIVE 09/10/2020  Hepatitis A Lab Results  Component Value Date   HAV NEG 12/09/2011   Lipids: Lab Results  Component Value Date   CHOL 224 (H) 10/19/2022   TRIG 86 10/19/2022   HDL 45 (L) 10/19/2022   CHOLHDL 5.0 (H) 10/19/2022   VLDL 9 10/27/2015   LDLCALC 160 (H) 10/19/2022    Target Date: The 8th  Assessment: Viola presents today for her maintenance Cabenuva  injections. Past injections were tolerated well without issues. Last HIV RNA was not detected in October. Doing well with no issues today. Due for Sunlenca  at February appointment.  Lab work:  None today  Cabenuva : Administered cabotegravir  600mg /75mL in left upper outer quadrant of the gluteal muscle. Administered rilpivirine  900 mg/3mL in the right upper outer quadrant of the gluteal muscle. No issues  with injections. She will follow up in 2 months for next set of injections.    Plan: - Cabenuva  injections administered - Next injections scheduled for 04/25/24 with Alan (needed a 4pm and a Thursday appointment) - Call with any issues or questions  Alexandros Ewan L. Severn Goddard, PharmD, BCIDP, AAHIVP, CPP Clinical Pharmacist Practitioner - Infectious Diseases Clinical Pharmacist Lead - Specialty Pharmacy [facility] for Infect[facility]

## 2024-02-20 ENCOUNTER — Ambulatory Visit: Admitting: Pharmacist

## 2024-02-20 ENCOUNTER — Other Ambulatory Visit: Payer: Self-pay

## 2024-02-20 ENCOUNTER — Encounter: Payer: Self-pay | Admitting: Pharmacist

## 2024-02-20 DIAGNOSIS — B20 Human immunodeficiency virus [HIV] disease: Secondary | ICD-10-CM

## 2024-02-20 MED ORDER — CABOTEGRAVIR & RILPIVIRINE ER 600 & 900 MG/3ML IM SUER
1.0000 | Freq: Once | INTRAMUSCULAR | Status: AC
  Administered 2024-02-20: 1 via INTRAMUSCULAR

## 2024-04-08 ENCOUNTER — Other Ambulatory Visit: Payer: Self-pay

## 2024-04-08 ENCOUNTER — Other Ambulatory Visit (HOSPITAL_COMMUNITY): Payer: Self-pay

## 2024-04-08 NOTE — Progress Notes (Signed)
 Specialty Pharmacy Refill Coordination Note  Kimberly Bishop  is a 34 y.o. female assessed today regarding refills of clinic administered specialty medication(s) Cabotegravir  & Rilpivirine  (CABENUVA ); Lenacapavir  Sodium (Sunlenca )   Clinic requested Courier to Provider Office   Delivery date: 04/15/24   Verified address: [address] [place] [address]   Medication will be filled on 04/12/24.

## 2024-04-12 ENCOUNTER — Other Ambulatory Visit: Payer: Self-pay

## 2024-04-12 NOTE — Progress Notes (Signed)
 Due to impending winter weather conditions specialty medication(s) Cabotegravir  & Rilpivirine  (CABENUVA ); Lenacapavir  Sodium (Sunlenca )  will be filled 04/22/24 to be Courier to Provider Office by 04/23/24

## 2024-04-17 NOTE — Progress Notes (Unsigned)
 "  HPI: Kimberly Bishop  is a 34 y.o. female who presents to the [facility] pharmacy clinic for Sunlenca  and Cabenuva  administration.  Referring ID Physician: Corean Fireman, NP   Patient Active Problem List   Diagnosis Date Noted   Mixed hyperlipidemia 10/26/2022   Primary hypertension 10/26/2022   Elevated liver enzymes 10/26/2022   Uterine fibroid 08/16/2022   Nexplanon  in place since 07/06/22 07/06/2022   History of poor fetal growth 06/28/2022   Vitamin D  deficiency 07/22/2021   Nephrolithiasis 01/18/2021   History of pre-eclampsia 03/29/2019   HIV (human immunodeficiency virus infection) (HCC) 03/29/2019   CIN I (cervical intraepithelial neoplasia I) 11/28/2018   ASCUS with positive high risk HPV cervical 11/26/2018   History of shingles 03/22/2016    Patient's Medications  New Prescriptions   No medications on file  Previous Medications   CABOTEGRAVIR  & RILPIVIRINE  ER (CABENUVA ) 600 & 900 MG/3ML INJECTION    Inject 1 kit into the muscle every 2 (two) months.   ETONOGESTREL  (NEXPLANON ) 68 MG IMPL IMPLANT    1 each by Subdermal route once.   NIFEDIPINE  (ADALAT  CC) 30 MG 24 HR TABLET    Take 1 tablet (30 mg total) by mouth 2 (two) times daily.   SQ INJECTION LENACAPAVIR  (SUNLENCA ) 463.5 MG/1.5ML SQ INJECTION    Inject 3 mLs (927 mg total) into the skin every 6 (six) months. Administer each injection subcutaneously at separate sites in the abdomen (more or equal to 2 inches from the navel).  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 (HCC)    History of cesarean delivery 04/29/2019   PCS for HIV VL>1000 in setting of IUGR, oligo, at 36 weeks     History of kidney stones    History of preterm delivery 04/29/2019   Intrauterine pregnancy at [redacted]w[redacted]d weeks gestation; IUGR 6%,  oligohydramnios, HIV with viral load >1000    HIV (human immunodeficiency virus infection) (HCC)    Hypertension    Immune deficiency disorder    Infection    UTI   Kidney stones    Mixed hyperlipidemia 10/26/2022   Molluscum contagiosum 03/22/2016    Social History: Social History   Socioeconomic History   Marital status: Married    Spouse name: Not on file   Number of children: Not on file   Years of education: Not on file   Highest education level: Not on file  Occupational History    Comment: unemployed  Tobacco Use   Smoking status: Never   Smokeless tobacco: Never  Vaping Use   Vaping status: Never Used  Substance and Sexual Activity   Alcohol use: Not Currently    Comment: occasional -    Drug use: Not Currently    Types: Marijuana    Comment: last use 08/24/18   Sexual activity: Yes    Partners: Male    Birth control/protection: Implant, None    Comment: declined condoms 06/21/22  Other Topics Concern   Not on file  Social History Narrative   Not on file   Social Drivers of Health   Tobacco Use: Low Risk (12/27/2023)   Patient History    Smoking Tobacco Use: Never    Smokeless Tobacco Use: Never    Passive Exposure: Not on file  Financial Resource Strain: Not on file  Food  Insecurity: No Food Insecurity (06/28/2022)   Hunger Vital Sign    Worried About Running Out of Food in the Last Year: Never true    Ran Out of Food in the Last Year: Never true  Transportation Needs: No Transportation Needs (06/28/2022)   PRAPARE - Administrator, Civil Service (Medical): No    Lack of Transportation (Non-Medical): No  Physical Activity: Not on file  Stress: Not on file  Social Connections: Not on file  Depression (PHQ2-9): Low Risk (04/19/2023)   Depression (PHQ2-9)    PHQ-2 Score: 0  Alcohol Screen: Not on file  Housing: Low Risk (06/28/2022)   Housing    Last Housing Risk Score: 0  Utilities: Not At Risk (06/28/2022)   AHC Utilities    Threatened  with loss of utilities: No  Health Literacy: Not on file    Labs: Lab Results  Component Value Date   HIV1RNAQUANT NOT DETECTED 12/27/2023   HIV1RNAQUANT 39 (H) 08/23/2023   HIV1RNAQUANT 39 (H) 04/19/2023   CD4TABS 422 12/27/2023   CD4TABS 477 04/19/2023   CD4TABS 343 (L) 10/19/2022    RPR and STI Lab Results  Component Value Date   LABRPR NON-REACTIVE 12/27/2023   LABRPR NON-REACTIVE 10/19/2022   LABRPR NON REACTIVE 06/28/2022   LABRPR Non Reactive 02/22/2022   LABRPR NON-REACTIVE 02/18/2021    STI Results GC GC CT CT  Latest Ref Rng & Units  NEGATIVE  NEGATIVE  06/28/2022 11:07 AM Negative   Negative    02/22/2022 10:49 AM Negative   Negative    08/18/2021  4:45 PM Negative   Negative    10/13/2020  9:03 AM Negative   Negative    03/26/2019 12:43 PM Negative   Negative    10/18/2018 12:00 AM Negative   Negative    05/25/2018 12:00 AM Negative   **POSITIVE**    03/28/2017 12:00 AM Negative   Negative    02/11/2017 12:00 AM Negative   Negative    07/07/2016 12:00 AM Negative   Negative    10/27/2015 12:00 AM Negative   Negative    02/07/2015 12:00 AM Negative   **POSITIVE**    05/19/2014 12:00 AM NG: Negative   CT: Negative    12/23/2013  3:05 PM  NEGATIVE   NEGATIVE   05/16/2013 12:00 AM NG: Negative   CT: Negative    10/22/2012  5:48 PM  NEGATIVE   NEGATIVE   02/12/2012 10:52 PM  NEGATIVE   NEGATIVE     Hepatitis B Lab Results  Component Value Date   HEPBSAB NON-REACTIVE 09/10/2020   HEPBSAG Negative 02/22/2022   HEPBCAB NON-REACTIVE 09/10/2020   Hepatitis C Lab Results  Component Value Date   HEPCAB NON-REACTIVE 09/10/2020   Hepatitis A Lab Results  Component Value Date   HAV NEG 12/09/2011   Lipids: Lab Results  Component Value Date   CHOL 224 (H) 10/19/2022   TRIG 86 10/19/2022   HDL 45 (L) 10/19/2022   CHOLHDL 5.0 (H) 10/19/2022   VLDL 9 10/27/2015   LDLCALC 160 (H) 10/19/2022    Current HIV Regimen: Cabenuva  + Sunlenca    TARGET DATE:  Cabenuva  = The 8th of the month, Sunlenca  = The 9th of the month  Assessment: Nadalyn presents today for their maintenance Cabenuva  and Sunlenca  injections. Administered lenacapavir  463.5/1.62mL subcutaneously in upper *** abdomen. Administered lenacapavir  463.5/1.73mL subcutaneously in lower *** abdomen more than two inches away from first injection site. Monitored patient for 10 minutes after injection. Injections were  tolerated well without issue. Will need a follow up appointment for injections in 6 months.   Administered cabotegravir  600mg /50mL in left upper outer quadrant of the gluteal muscle. Administered rilpivirine  900 mg/3mL in the right upper outer quadrant of the gluteal muscle. Monitored patient for 10 minutes after injection. Injections were tolerated well without issue. Patient will follow up in 2 months for next injection.  Due for HIV RNA and lipid panel at next appointment. Eligible for Hav, HBV, Menveo, flu, and COVID vaccinations which she declines today. Politely declines STI testing.    Plan: - Administer Sunlenca  injections - Administer Cabenuva  injections - Follow up with Corean on ***  - Schedule Sunlenca  injections in 6 months (***) - Contact for any issues or questions  Alan Geralds, PharmD, CPP, BCIDP, AAHIVP Clinical Pharmacist Practitioner Infectious Diseases Clinical Pharmacist [facility]     [1] No Known Allergies  "

## 2024-04-25 ENCOUNTER — Ambulatory Visit: Admitting: Pharmacist

## 2024-04-25 DIAGNOSIS — B20 Human immunodeficiency virus [HIV] disease: Secondary | ICD-10-CM
